# Patient Record
Sex: Male | Born: 1945 | Race: Black or African American | Hispanic: No | Marital: Married | Smoking: Former smoker
Health system: Southern US, Community
[De-identification: ages and names within clinical notes are randomized; demographics above are authoritative.]

## PROBLEM LIST (undated history)

## (undated) DIAGNOSIS — I2510 Atherosclerotic heart disease of native coronary artery without angina pectoris: Secondary | ICD-10-CM

## (undated) DIAGNOSIS — N183 Chronic kidney disease, stage 3 unspecified: Secondary | ICD-10-CM

## (undated) DIAGNOSIS — I10 Essential (primary) hypertension: Secondary | ICD-10-CM

## (undated) DIAGNOSIS — C61 Malignant neoplasm of prostate: Secondary | ICD-10-CM

## (undated) DIAGNOSIS — I219 Acute myocardial infarction, unspecified: Secondary | ICD-10-CM

## (undated) DIAGNOSIS — G4730 Sleep apnea, unspecified: Secondary | ICD-10-CM

## (undated) DIAGNOSIS — K5790 Diverticulosis of intestine, part unspecified, without perforation or abscess without bleeding: Secondary | ICD-10-CM

## (undated) DIAGNOSIS — G4733 Obstructive sleep apnea (adult) (pediatric): Secondary | ICD-10-CM

## (undated) DIAGNOSIS — E785 Hyperlipidemia, unspecified: Secondary | ICD-10-CM

## (undated) DIAGNOSIS — D689 Coagulation defect, unspecified: Secondary | ICD-10-CM

## (undated) HISTORY — DX: Coagulation defect, unspecified: D68.9

## (undated) HISTORY — DX: Essential (primary) hypertension: I10

## (undated) HISTORY — PX: ABDOMINAL SURGERY: SHX537

## (undated) HISTORY — PX: EXPLORATORY LAPAROTOMY: SUR591

## (undated) HISTORY — DX: Atherosclerotic heart disease of native coronary artery without angina pectoris: I25.10

## (undated) HISTORY — DX: Sleep apnea, unspecified: G47.30

## (undated) HISTORY — DX: Acute myocardial infarction, unspecified: I21.9

## (undated) HISTORY — PX: CORONARY ANGIOPLASTY WITH STENT PLACEMENT: SHX49

## (undated) HISTORY — DX: Diverticulosis of intestine, part unspecified, without perforation or abscess without bleeding: K57.90

## (undated) HISTORY — DX: Obstructive sleep apnea (adult) (pediatric): G47.33

## (undated) HISTORY — DX: Malignant neoplasm of prostate: C61

## (undated) HISTORY — DX: Chronic kidney disease, stage 3 unspecified: N18.30

## (undated) HISTORY — DX: Hyperlipidemia, unspecified: E78.5

---

## 1992-05-07 DIAGNOSIS — I2510 Atherosclerotic heart disease of native coronary artery without angina pectoris: Secondary | ICD-10-CM

## 1992-05-07 DIAGNOSIS — I219 Acute myocardial infarction, unspecified: Secondary | ICD-10-CM

## 1992-05-07 HISTORY — DX: Atherosclerotic heart disease of native coronary artery without angina pectoris: I25.10

## 1992-05-07 HISTORY — PX: CORONARY ARTERY BYPASS GRAFT: SHX141

## 1992-05-07 HISTORY — DX: Acute myocardial infarction, unspecified: I21.9

## 2000-03-23 ENCOUNTER — Emergency Department (HOSPITAL_COMMUNITY): Admission: EM | Admit: 2000-03-23 | Discharge: 2000-03-24 | Payer: Self-pay | Admitting: Emergency Medicine

## 2000-03-23 ENCOUNTER — Encounter: Payer: Self-pay | Admitting: Emergency Medicine

## 2002-07-06 ENCOUNTER — Encounter: Payer: Self-pay | Admitting: Internal Medicine

## 2003-07-08 ENCOUNTER — Encounter: Payer: Self-pay | Admitting: Internal Medicine

## 2003-09-01 ENCOUNTER — Emergency Department (HOSPITAL_COMMUNITY): Admission: EM | Admit: 2003-09-01 | Discharge: 2003-09-01 | Payer: Self-pay | Admitting: Emergency Medicine

## 2004-11-30 ENCOUNTER — Ambulatory Visit: Payer: Self-pay | Admitting: Internal Medicine

## 2006-01-23 ENCOUNTER — Ambulatory Visit: Payer: Self-pay | Admitting: Internal Medicine

## 2006-03-04 ENCOUNTER — Ambulatory Visit: Payer: Self-pay | Admitting: Internal Medicine

## 2006-03-04 LAB — CONVERTED CEMR LAB
AST: 27 units/L (ref 0–37)
Albumin: 3.9 g/dL (ref 3.5–5.2)
Alkaline Phosphatase: 69 units/L (ref 39–117)
BUN: 12 mg/dL (ref 6–23)
Basophils Absolute: 0 10*3/uL (ref 0.0–0.1)
Calcium: 9.2 mg/dL (ref 8.4–10.5)
Chol/HDL Ratio, serum: 6.3
Cholesterol: 256 mg/dL (ref 0–200)
Creatinine, Ser: 1.5 mg/dL (ref 0.4–1.5)
Eosinophil percent: 1.7 % (ref 0.0–5.0)
GFR calc non Af Amer: 51 mL/min
Glomerular Filtration Rate, Af Am: 61 mL/min/{1.73_m2}
Glucose, Bld: 97 mg/dL (ref 70–99)
LDL DIRECT: 143.1 mg/dL
Lymphocytes Relative: 35.4 % (ref 12.0–46.0)
MCHC: 32.8 g/dL (ref 30.0–36.0)
Monocytes Relative: 12.7 % — ABNORMAL HIGH (ref 3.0–11.0)
Neutro Abs: 2.8 10*3/uL (ref 1.4–7.7)
PSA: 3.24 ng/mL (ref 0.10–4.00)
RBC: 5.09 M/uL (ref 4.22–5.81)
VLDL: 43 mg/dL — ABNORMAL HIGH (ref 0–40)
WBC: 5.6 10*3/uL (ref 4.5–10.5)

## 2006-04-15 ENCOUNTER — Ambulatory Visit: Payer: Self-pay | Admitting: Internal Medicine

## 2006-07-02 ENCOUNTER — Ambulatory Visit: Payer: Self-pay | Admitting: Internal Medicine

## 2006-07-02 LAB — CONVERTED CEMR LAB
AST: 22 units/L (ref 0–37)
Albumin: 3.9 g/dL (ref 3.5–5.2)
BUN: 7 mg/dL (ref 6–23)
Basophils Relative: 0.6 % (ref 0.0–1.0)
Cholesterol: 197 mg/dL (ref 0–200)
HDL: 40.9 mg/dL (ref >39.0)
Hemoglobin: 15.1 g/dL (ref 13.0–17.0)
Monocytes Absolute: 0.7 10*3/uL (ref 0.2–0.7)
Monocytes Relative: 11 % (ref 3.0–11.0)
Neutro Abs: 3.3 10*3/uL (ref 1.4–7.7)
PSA: 3.72 ng/mL (ref 0.10–4.00)
Platelets: 261 10*3/uL (ref 150–400)
Sodium: 142 meq/L (ref 135–145)
Total Protein: 7.5 g/dL (ref 6.0–8.3)

## 2006-07-08 ENCOUNTER — Ambulatory Visit: Payer: Self-pay | Admitting: Internal Medicine

## 2006-12-18 DIAGNOSIS — E785 Hyperlipidemia, unspecified: Secondary | ICD-10-CM | POA: Insufficient documentation

## 2006-12-18 DIAGNOSIS — I10 Essential (primary) hypertension: Secondary | ICD-10-CM | POA: Insufficient documentation

## 2007-01-01 ENCOUNTER — Telehealth: Payer: Self-pay | Admitting: Internal Medicine

## 2007-01-07 ENCOUNTER — Ambulatory Visit: Payer: Self-pay | Admitting: Internal Medicine

## 2007-01-08 LAB — CONVERTED CEMR LAB
ALT: 22 units/L (ref 0–53)
Alkaline Phosphatase: 79 units/L (ref 39–117)
Bilirubin, Direct: 0.1 mg/dL (ref 0.0–0.3)
Cholesterol: 253 mg/dL (ref 0–200)
Direct LDL: 139.1 mg/dL
HDL: 39.3 mg/dL (ref >39.0)
Total Bilirubin: 1.1 mg/dL (ref 0.3–1.2)
Total Protein: 6.8 g/dL (ref 6.0–8.3)

## 2007-01-14 ENCOUNTER — Telehealth: Payer: Self-pay | Admitting: *Deleted

## 2007-01-23 ENCOUNTER — Ambulatory Visit: Payer: Self-pay | Admitting: Internal Medicine

## 2007-03-20 ENCOUNTER — Ambulatory Visit: Payer: Self-pay | Admitting: Internal Medicine

## 2007-03-20 LAB — CONVERTED CEMR LAB
ALT: 21 units/L (ref 0–53)
AST: 25 units/L (ref 0–37)
Albumin: 4.2 g/dL (ref 3.5–5.2)
Alkaline Phosphatase: 99 units/L (ref 39–117)
Bilirubin, Direct: 0.2 mg/dL (ref 0.0–0.3)
Total Bilirubin: 1 mg/dL (ref 0.3–1.2)
Total CHOL/HDL Ratio: 6
Triglycerides: 335 mg/dL (ref 0–149)

## 2007-03-31 ENCOUNTER — Ambulatory Visit: Payer: Self-pay | Admitting: Internal Medicine

## 2007-03-31 LAB — CONVERTED CEMR LAB
HDL goal, serum: 40 mg/dL
LDL Goal: 130 mg/dL

## 2007-09-05 ENCOUNTER — Ambulatory Visit: Payer: Self-pay | Admitting: Internal Medicine

## 2007-09-05 DIAGNOSIS — M549 Dorsalgia, unspecified: Secondary | ICD-10-CM | POA: Insufficient documentation

## 2007-09-05 DIAGNOSIS — J069 Acute upper respiratory infection, unspecified: Secondary | ICD-10-CM | POA: Insufficient documentation

## 2008-01-06 ENCOUNTER — Encounter: Payer: Self-pay | Admitting: Internal Medicine

## 2008-01-06 ENCOUNTER — Other Ambulatory Visit: Admission: RE | Admit: 2008-01-06 | Discharge: 2008-01-06 | Payer: Self-pay | Admitting: Otolaryngology

## 2008-01-09 ENCOUNTER — Ambulatory Visit: Payer: Self-pay | Admitting: Cardiovascular Disease

## 2008-01-09 ENCOUNTER — Ambulatory Visit: Payer: Self-pay | Admitting: Internal Medicine

## 2008-01-09 LAB — CONVERTED CEMR LAB
ALT: 17 units/L (ref 0–53)
Albumin: 3.9 g/dL (ref 3.5–5.2)
BUN: 8 mg/dL (ref 6–23)
Bilirubin, Direct: 0.1 mg/dL (ref 0.0–0.3)
Calcium: 9.3 mg/dL (ref 8.4–10.5)
Chloride: 108 meq/L (ref 96–112)
Cholesterol: 191 mg/dL (ref 0–200)
Creatinine, Ser: 1.3 mg/dL (ref 0.4–1.5)
Direct LDL: 82.2 mg/dL
GFR calc non Af Amer: 59 mL/min
Glucose, Bld: 103 mg/dL — ABNORMAL HIGH (ref 70–99)
HCT: 42.2 % (ref 39.0–52.0)
Lymphocytes Relative: 36 % (ref 12.0–46.0)
Monocytes Absolute: 0.7 10*3/uL (ref 0.1–1.0)
Monocytes Relative: 12.1 % — ABNORMAL HIGH (ref 3.0–12.0)
Neutrophils Relative %: 49.5 % (ref 43.0–77.0)
PSA: 2.9 ng/mL (ref 0.10–4.00)
Potassium: 5.6 meq/L — ABNORMAL HIGH (ref 3.5–5.1)
RBC: 4.74 M/uL (ref 4.22–5.81)
Total Bilirubin: 1.1 mg/dL (ref 0.3–1.2)
Total CHOL/HDL Ratio: 5.1
Triglycerides: 208 mg/dL (ref 0–149)

## 2008-01-14 ENCOUNTER — Encounter: Payer: Self-pay | Admitting: Internal Medicine

## 2008-02-24 ENCOUNTER — Ambulatory Visit: Payer: Self-pay | Admitting: Internal Medicine

## 2008-02-24 DIAGNOSIS — D37039 Neoplasm of uncertain behavior of the major salivary glands, unspecified: Secondary | ICD-10-CM | POA: Insufficient documentation

## 2008-03-02 ENCOUNTER — Encounter: Payer: Self-pay | Admitting: Internal Medicine

## 2008-03-12 ENCOUNTER — Ambulatory Visit: Payer: Self-pay | Admitting: Internal Medicine

## 2008-06-22 ENCOUNTER — Inpatient Hospital Stay (HOSPITAL_COMMUNITY): Admission: EM | Admit: 2008-06-22 | Discharge: 2008-06-27 | Payer: Self-pay | Admitting: Emergency Medicine

## 2008-06-23 HISTORY — PX: CARDIAC CATHETERIZATION: SHX172

## 2008-06-25 HISTORY — PX: PERCUTANEOUS CORONARY STENT INTERVENTION (PCI-S): SHX6016

## 2008-07-05 ENCOUNTER — Encounter: Payer: Self-pay | Admitting: Internal Medicine

## 2008-07-08 ENCOUNTER — Encounter (HOSPITAL_COMMUNITY): Admission: RE | Admit: 2008-07-08 | Discharge: 2008-10-06 | Payer: Self-pay | Admitting: Cardiovascular Disease

## 2008-08-18 ENCOUNTER — Ambulatory Visit: Payer: Self-pay | Admitting: Internal Medicine

## 2008-08-18 LAB — CONVERTED CEMR LAB
ALT: 17 units/L (ref 0–53)
Alkaline Phosphatase: 65 units/L (ref 39–117)
Bilirubin, Direct: 0.1 mg/dL (ref 0.0–0.3)
Total Protein: 7.2 g/dL (ref 6.0–8.3)

## 2008-09-01 ENCOUNTER — Ambulatory Visit: Payer: Self-pay | Admitting: Internal Medicine

## 2008-10-07 ENCOUNTER — Encounter (HOSPITAL_COMMUNITY): Admission: RE | Admit: 2008-10-07 | Discharge: 2008-12-05 | Payer: Self-pay | Admitting: Cardiovascular Disease

## 2008-10-27 ENCOUNTER — Encounter: Payer: Self-pay | Admitting: Internal Medicine

## 2008-12-07 ENCOUNTER — Emergency Department (HOSPITAL_COMMUNITY): Admission: EM | Admit: 2008-12-07 | Discharge: 2008-12-07 | Payer: Self-pay | Admitting: Emergency Medicine

## 2008-12-08 ENCOUNTER — Telehealth: Payer: Self-pay | Admitting: Family Medicine

## 2008-12-08 ENCOUNTER — Telehealth: Payer: Self-pay | Admitting: Internal Medicine

## 2008-12-08 DIAGNOSIS — N410 Acute prostatitis: Secondary | ICD-10-CM | POA: Insufficient documentation

## 2008-12-10 ENCOUNTER — Ambulatory Visit: Payer: Self-pay | Admitting: Endocrinology

## 2008-12-10 ENCOUNTER — Inpatient Hospital Stay (HOSPITAL_COMMUNITY): Admission: EM | Admit: 2008-12-10 | Discharge: 2008-12-12 | Payer: Self-pay | Admitting: Emergency Medicine

## 2008-12-13 ENCOUNTER — Encounter: Payer: Self-pay | Admitting: Internal Medicine

## 2008-12-21 ENCOUNTER — Telehealth: Payer: Self-pay | Admitting: Internal Medicine

## 2009-01-04 ENCOUNTER — Encounter: Payer: Self-pay | Admitting: Internal Medicine

## 2009-01-11 ENCOUNTER — Encounter: Payer: Self-pay | Admitting: Internal Medicine

## 2009-02-09 ENCOUNTER — Encounter: Payer: Self-pay | Admitting: Internal Medicine

## 2009-02-25 ENCOUNTER — Encounter: Payer: Self-pay | Admitting: Internal Medicine

## 2009-03-22 ENCOUNTER — Encounter (INDEPENDENT_AMBULATORY_CARE_PROVIDER_SITE_OTHER): Payer: Self-pay | Admitting: *Deleted

## 2009-03-28 ENCOUNTER — Telehealth: Payer: Self-pay | Admitting: Internal Medicine

## 2009-04-05 ENCOUNTER — Ambulatory Visit: Payer: Self-pay | Admitting: Internal Medicine

## 2009-04-05 DIAGNOSIS — C61 Malignant neoplasm of prostate: Secondary | ICD-10-CM | POA: Insufficient documentation

## 2009-04-07 ENCOUNTER — Encounter (INDEPENDENT_AMBULATORY_CARE_PROVIDER_SITE_OTHER): Payer: Self-pay | Admitting: *Deleted

## 2009-04-11 ENCOUNTER — Ambulatory Visit (HOSPITAL_COMMUNITY): Admission: RE | Admit: 2009-04-11 | Discharge: 2009-04-11 | Payer: Self-pay | Admitting: Urology

## 2009-04-15 ENCOUNTER — Encounter: Admission: RE | Admit: 2009-04-15 | Discharge: 2009-04-15 | Payer: Self-pay | Admitting: Otolaryngology

## 2009-04-28 ENCOUNTER — Ambulatory Visit (HOSPITAL_COMMUNITY): Admission: RE | Admit: 2009-04-28 | Discharge: 2009-04-28 | Payer: Self-pay | Admitting: Urology

## 2009-05-07 HISTORY — PX: OTHER SURGICAL HISTORY: SHX169

## 2009-05-11 ENCOUNTER — Encounter: Payer: Self-pay | Admitting: Internal Medicine

## 2009-05-16 ENCOUNTER — Encounter: Payer: Self-pay | Admitting: Internal Medicine

## 2009-05-18 ENCOUNTER — Encounter: Payer: Self-pay | Admitting: Internal Medicine

## 2009-05-25 ENCOUNTER — Ambulatory Visit (HOSPITAL_COMMUNITY): Admission: RE | Admit: 2009-05-25 | Discharge: 2009-05-26 | Payer: Self-pay | Admitting: Otolaryngology

## 2009-05-25 ENCOUNTER — Encounter (INDEPENDENT_AMBULATORY_CARE_PROVIDER_SITE_OTHER): Payer: Self-pay | Admitting: Otolaryngology

## 2009-06-07 HISTORY — PX: PROSTATECTOMY: SHX69

## 2009-06-15 ENCOUNTER — Telehealth: Payer: Self-pay | Admitting: Internal Medicine

## 2009-07-27 ENCOUNTER — Ambulatory Visit: Payer: Self-pay | Admitting: Internal Medicine

## 2009-07-29 ENCOUNTER — Telehealth: Payer: Self-pay | Admitting: Internal Medicine

## 2009-07-29 LAB — CONVERTED CEMR LAB: PSA: 3.4 ng/mL (ref 0.10–4.00)

## 2009-08-12 ENCOUNTER — Encounter (INDEPENDENT_AMBULATORY_CARE_PROVIDER_SITE_OTHER): Payer: Self-pay | Admitting: Urology

## 2009-08-12 ENCOUNTER — Inpatient Hospital Stay (HOSPITAL_COMMUNITY): Admission: RE | Admit: 2009-08-12 | Discharge: 2009-08-16 | Payer: Self-pay | Admitting: Urology

## 2009-10-10 ENCOUNTER — Encounter: Payer: Self-pay | Admitting: Internal Medicine

## 2009-10-23 ENCOUNTER — Observation Stay (HOSPITAL_COMMUNITY): Admission: EM | Admit: 2009-10-23 | Discharge: 2009-10-26 | Payer: Self-pay | Admitting: Emergency Medicine

## 2009-10-23 ENCOUNTER — Emergency Department (HOSPITAL_COMMUNITY): Admission: EM | Admit: 2009-10-23 | Discharge: 2009-10-23 | Payer: Self-pay | Admitting: Family Medicine

## 2009-10-24 ENCOUNTER — Ambulatory Visit: Payer: Self-pay | Admitting: Vascular Surgery

## 2009-10-24 ENCOUNTER — Encounter (INDEPENDENT_AMBULATORY_CARE_PROVIDER_SITE_OTHER): Payer: Self-pay | Admitting: Internal Medicine

## 2010-01-10 ENCOUNTER — Encounter: Payer: Self-pay | Admitting: Internal Medicine

## 2010-04-18 ENCOUNTER — Encounter: Payer: Self-pay | Admitting: Internal Medicine

## 2010-06-06 NOTE — Progress Notes (Signed)
  Phone Note Call from Patient Call back at Home Phone [phone]   Caller: Spouse----live call Reason for Call: Lab or Test Results Summary of Call: would like lab results. Initial call taken by: Warnell Forester,  July 29, 2009 1:43 PM  Follow-up for Phone Call        may tell him result....    Follow-up by: Stacie Glaze MD,  July 29, 2009 2:19 PM  Additional Follow-up for Phone Call Additional follow up Details #1::        pt's wife informed and faxed to dr Earlene Plater his surgeon Additional Follow-up by: Willy Eddy, LPN,  July 29, 2009 2:25 PM

## 2010-06-06 NOTE — Letter (Signed)
Summary: [facility]  [facility]   Imported By: Maryln Gottron 05/25/2009 13:56:19  _____________________________________________________________________  External Attachment:    Type:   Image     Comment:   External Document

## 2010-06-06 NOTE — Assessment & Plan Note (Signed)
Summary: 2 MONTH ROV/NJR/PT RSC/CJR   Vital Signs:  Patient profile:   64 year old male Height:      71 inches Weight:      216 pounds BMI:     30.23 Temp:     98.2 degrees F oral Pulse rate:   72 / minute Resp:     14 per minute BP sitting:   136 / 80  (left arm)  Vitals Entered By: Willy Eddy, LPN (July 27, 2009 10:37 AM) CC: roa   CC:  roa.  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 1 Once Daily 2)  Altace 10 Mg  Tabs (Ramipril) .... Take 1 Tablet By Mouth Once A Day 3)  Crestor 40 Mg Tabs (Rosuvastatin Calcium) .Marland Kitchen.. 1 Once Daily 4)  Aspirin 81 Mg  Tbec (Aspirin) .... Once Daily 5)  Cvs Vitamin E 800 Unit  Caps (Vitamin E) .... Once Daily 6)  Coq10 100 Mg  Caps (Coenzyme Q10) .... Once Daily 7)  Flomax 0.4 Mg  Cp24 (Tamsulosin Hcl) .... Take 1 Capsule By Mouth Once A Day 8)  Proscar 5 Mg  Tabs (Finasteride) .... Once Daily 9)  Plavix 75 Mg Tabs (Clopidogrel  Bisulfate) .Marland Kitchen.. 1 Once Daily 10)  Ranexa 500 Mg Xr12h-Tab (Ranolazine) .Marland Kitchen.. 1 Once Daily 11)  Niaspan 500 Mg Cr-Tabs (Niacin (Antihyperlipidemic)) .Marland Kitchen.. 1 Once Daily 12)  Diovan 80 Mg Tabs (Valsartan) .Marland Kitchen.. 1 Once Daily 13)  Lovaza 1 Gm Caps (Omega-3-Acid Ethyl Esters) .... 2 Two Times A Day  Current Medications (verified): 1)  Toprol Xl 50 Mg Tb24 (Metoprolol Succinate) .Marland Kitchen.. 1 Once Daily 2)  Altace 10 Mg  Tabs (Ramipril) .... Take 1 Tablet By Mouth Once A Day 3)  Crestor 40 Mg Tabs (Rosuvastatin Calcium) .Marland Kitchen.. 1 Once Daily 4)  Aspirin 81 Mg  Tbec (Aspirin) .... Once Daily 5)  Cvs Vitamin E 800 Unit  Caps (Vitamin E) .... Once Daily 6)  Coq10 100 Mg  Caps (Coenzyme Q10) .... Once Daily 7)  Flomax 0.4 Mg  Cp24 (Tamsulosin Hcl) .... Take 1 Capsule By Mouth Once A Day 8)  Proscar 5 Mg  Tabs (Finasteride) .... Once Daily 9)  Plavix 75 Mg Tabs (Clopidogrel Bisulfate) .Marland Kitchen.. 1 Once Daily 10)  Ranexa 500 Mg Xr12h-Tab (Ranolazine) .Marland Kitchen.. 1 Once Daily 11)  Niaspan 500 Mg Cr-Tabs (Niacin (Antihyperlipidemic)) .Marland Kitchen.. 1 Once Daily 12)  Diovan 80 Mg Tabs (Valsartan) .Marland Kitchen.. 1 Once Daily 13)  Lovaza 1 Gm Caps (Omega-3-Acid Ethyl  Esters) .... 2 Two Times A Day  Allergies (verified): No Known Drug Allergies  Past History:  Social History: Last updated: 03/31/2007 Occupation:pastor Married Never Smoked  Risk Factors: Smoking Status: never (07/27/2009)  Past medical, surgical, family and social histories (including risk factors) reviewed, and no changes noted (except as noted below).  Past Medical History: Reviewed history from 12/18/2006 and no changes required. Hyperlipidemia Hypertension  Family History: Reviewed history and no changes required.  Social History: Reviewed history from 03/31/2007 and no changes required. 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    Physical Exam  General:  alert and well-developed.   Ears:  R ear normal and L ear normal.   Mouth:  good dentition and pharynx pink and moist.   Neck:  No deformities, masses, or tenderness noted. Lungs:  normal respiratory effort and no wheezes.   Heart:  normal rate and regular rhythm.   Abdomen:  soft and non-tender.   Msk:  normal ROM, no joint tenderness, and no joint swelling.   Extremities:  No clubbing, cyanosis, edema, or deformity noted with normal full range of motion of all joints.   Neurologic:  No cranial nerve deficits noted. Station and gait are normal. Plantar reflexes are down-going bilaterally. DTRs are symmetrical throughout. Sensory, motor and coordinative functions appear intact.   Impression & Recommendations:  Problem # 1:  ADENOCARCINOMA, PROSTATE, GLEASON GRADE 7 (ICD-185) Assessment Unchanged discussed options at length surgical removal and follow up plans  Orders: TLB-PSA (Prostate Specific Antigen) (84153-PSA)  Problem # 2:  ADENOCARCINOMA, PROSTATE, GLEASON GRADE 7 (ICD-185) Assessment: Unchanged  Orders: TLB-PSA (Prostate Specific Antigen) (84153-PSA)  Complete Medication List: 1)  Toprol Xl 50 Mg Tb24 (Metoprolol succinate) .Marland Kitchen.. 1 once daily 2)  Altace 10 Mg Tabs (Ramipril) .... Take 1 tablet by mouth once a day 3)  Crestor 40 Mg Tabs (Rosuvastatin calcium) .Marland Kitchen.. 1 once daily 4)  Aspirin 81 Mg Tbec (Aspirin) .... Once daily 5)  Cvs Vitamin E 800 Unit Caps (Vitamin e) .... Once daily 6)  Coq10 100 Mg Caps (Coenzyme q10) .... Once daily 7)  Flomax 0.4 Mg Cp24  (Tamsulosin hcl) .... Take 1 capsule by mouth once a day 8)  Proscar 5 Mg Tabs (Finasteride) .... Once daily 9)  Plavix 75 Mg Tabs (Clopidogrel bisulfate) .Marland Kitchen.. 1 once daily 10)  Ranexa 500 Mg Xr12h-tab (Ranolazine) .Marland Kitchen.. 1 once daily 11)  Niaspan 500 Mg Cr-tabs (Niacin (antihyperlipidemic)) .Marland Kitchen.. 1 once daily 12)  Diovan 80 Mg Tabs (Valsartan) .Marland Kitchen.. 1 once daily 13)  Lovaza 1 Gm Caps (Omega-3-acid ethyl esters) .... 2 two times a day  Patient Instructions: 1)  Please schedule a follow-up appointment in 4 m

## 2010-06-06 NOTE — Progress Notes (Signed)
Summary: wants repeat PSA  Phone Note Call from Patient   Caller: executive asst,belinda, [phone] Reason for Call: Acute Illness Summary of Call: Wants to have a repeat PSA before his prostate surgery on the 23rd. Initial call taken by: Rudy Jew, RN,  June 15, 2009 4:01 PM  Follow-up for Phone Call        suggested to pt that he ask for psa when they draw his pre op lab work- pt agrees Follow-up by: Willy Eddy, LPN,  June 15, 2009 4:20 PM

## 2010-06-06 NOTE — Letter (Signed)
Summary: [facility]  [facility]   Imported By: Maryln Gottron 01/23/2010 13:12:13  _____________________________________________________________________  External Attachment:    Type:   Image     Comment:   External Document

## 2010-06-06 NOTE — Letter (Signed)
Summary: [facility]  [facility]   Imported By: Maryln Gottron 10/17/2009 09:42:33  _____________________________________________________________________  External Attachment:    Type:   Image     Comment:   External Document

## 2010-06-08 NOTE — Letter (Signed)
Summary: [facility]  [facility]   Imported By: Lennie Odor 04/25/2010 14:53:42  _____________________________________________________________________  External Attachment:    Type:   Image     Comment:   External Document

## 2010-06-09 NOTE — Letter (Signed)
Summary: [facility]  [facility]   Imported By: Maryln Gottron 05/18/2009 14:00:05  _____________________________________________________________________  External Attachment:    Type:   Image     Comment:   External Document

## 2010-07-22 LAB — COMPREHENSIVE METABOLIC PANEL
ALT: 20 U/L (ref 0–53)
AST: 29 U/L (ref 0–37)
Albumin: 3.8 g/dL (ref 3.5–5.2)
Chloride: 103 mEq/L (ref 96–112)
Creatinine, Ser: 1.59 mg/dL — ABNORMAL HIGH (ref 0.4–1.5)
GFR calc Af Amer: 53 mL/min — ABNORMAL LOW (ref >60)
Potassium: 4.8 mEq/L (ref 3.5–5.1)
Sodium: 135 mEq/L (ref 135–145)

## 2010-07-22 LAB — CBC
HCT: 42.5 % (ref 39.0–52.0)
Hemoglobin: 14.3 g/dL (ref 13.0–17.0)
MCV: 93 fL (ref 78.0–100.0)
RDW: 13.2 % (ref 11.5–15.5)

## 2010-07-23 LAB — COMPREHENSIVE METABOLIC PANEL
ALT: 17 U/L (ref 0–53)
ALT: 17 U/L (ref 0–53)
AST: 23 U/L (ref 0–37)
AST: 23 U/L (ref 0–37)
Albumin: 3.5 g/dL (ref 3.5–5.2)
Albumin: 3.6 g/dL (ref 3.5–5.2)
Alkaline Phosphatase: 69 U/L (ref 39–117)
Calcium: 9.1 mg/dL (ref 8.4–10.5)
Chloride: 104 mEq/L (ref 96–112)
Chloride: 105 mEq/L (ref 96–112)
Creatinine, Ser: 1.46 mg/dL (ref 0.4–1.5)
GFR calc Af Amer: 59 mL/min — ABNORMAL LOW (ref >60)
Potassium: 4.1 mEq/L (ref 3.5–5.1)
Sodium: 135 mEq/L (ref 135–145)
Sodium: 137 mEq/L (ref 135–145)
Total Protein: 6.8 g/dL (ref 6.0–8.3)

## 2010-07-23 LAB — CK TOTAL AND CKMB (NOT AT ARMC)
Relative Index: 0.8 (ref 0.0–2.5)
Total CK: 204 U/L (ref 7–232)

## 2010-07-23 LAB — POCT CARDIAC MARKERS: Troponin i, poc: <0.05 ng/mL (ref 0.00–0.09)

## 2010-07-23 LAB — ANTI-SMITH ANTIBODY: ENA SM Ab Ser-aCnc: <1 AU/mL (ref <30)

## 2010-07-23 LAB — BASIC METABOLIC PANEL
BUN: 6 mg/dL (ref 6–23)
CO2: 26 mEq/L (ref 19–32)
CO2: 29 mEq/L (ref 19–32)
Calcium: 9.3 mg/dL (ref 8.4–10.5)
Chloride: 103 mEq/L (ref 96–112)
Creatinine, Ser: 1.47 mg/dL (ref 0.4–1.5)
Glucose, Bld: 120 mg/dL — ABNORMAL HIGH (ref 70–99)
Potassium: 4.2 mEq/L (ref 3.5–5.1)
Potassium: 4.4 mEq/L (ref 3.5–5.1)
Sodium: 135 mEq/L (ref 135–145)

## 2010-07-23 LAB — DIFFERENTIAL
Basophils Absolute: 0.1 10*3/uL (ref 0.0–0.1)
Basophils Relative: 1 % (ref 0–1)
Eosinophils Absolute: 0.2 10*3/uL (ref 0.0–0.7)
Eosinophils Relative: 3 % (ref 0–5)
Monocytes Relative: 12 % (ref 3–12)
Neutrophils Relative %: 52 % (ref 43–77)

## 2010-07-23 LAB — CBC
HCT: 40.3 % (ref 39.0–52.0)
HCT: 43.5 % (ref 39.0–52.0)
HCT: 45.2 % (ref 39.0–52.0)
Hemoglobin: 15 g/dL (ref 13.0–17.0)
MCHC: 33.3 g/dL (ref 30.0–36.0)
MCHC: 33.6 g/dL (ref 30.0–36.0)
MCV: 90 fL (ref 78.0–100.0)
MCV: 91.1 fL (ref 78.0–100.0)
Platelets: 165 10*3/uL (ref 150–400)
Platelets: 167 10*3/uL (ref 150–400)
RBC: 4.83 MIL/uL (ref 4.22–5.81)
RDW: 12.9 % (ref 11.5–15.5)
WBC: 6.7 10*3/uL (ref 4.0–10.5)
WBC: 6.8 10*3/uL (ref 4.0–10.5)

## 2010-07-23 LAB — LIPID PANEL
Cholesterol: 191 mg/dL (ref 0–200)
HDL: 42 mg/dL (ref >39)
Total CHOL/HDL Ratio: 4.5 RATIO
VLDL: UNDETERMINED mg/dL (ref 0–40)

## 2010-07-23 LAB — ANA: Anti Nuclear Antibody(ANA): NEGATIVE

## 2010-07-23 LAB — URINALYSIS, ROUTINE W REFLEX MICROSCOPIC
Bilirubin Urine: NEGATIVE
Glucose, UA: NEGATIVE mg/dL
Hgb urine dipstick: NEGATIVE
Ketones, ur: NEGATIVE mg/dL
Protein, ur: NEGATIVE mg/dL

## 2010-07-23 LAB — ACETYLCHOLINE RECEPTOR, BINDING: Acetylcholine Receptor Ab: <0.3 nmol/L (ref <=0.30)

## 2010-07-23 LAB — TROPONIN I: Troponin I: 0.02 ng/mL (ref 0.00–0.06)

## 2010-07-23 LAB — PROTIME-INR: INR: 0.95 (ref 0.00–1.49)

## 2010-07-23 LAB — APTT: aPTT: 28 seconds (ref 24–37)

## 2010-07-26 LAB — CARDIAC PANEL(CRET KIN+CKTOT+MB+TROPI)
Relative Index: 0.4 (ref 0.0–2.5)
Relative Index: 0.4 (ref 0.0–2.5)
Total CK: 369 U/L — ABNORMAL HIGH (ref 7–232)
Total CK: 624 U/L — ABNORMAL HIGH (ref 7–232)
Troponin I: 0.02 ng/mL (ref 0.00–0.06)
Troponin I: 0.03 ng/mL (ref 0.00–0.06)

## 2010-07-26 LAB — BASIC METABOLIC PANEL
BUN: 12 mg/dL (ref 6–23)
Calcium: 8.4 mg/dL (ref 8.4–10.5)
Chloride: 106 mEq/L (ref 96–112)
GFR calc Af Amer: 40 mL/min — ABNORMAL LOW (ref >60)
GFR calc Af Amer: 42 mL/min — ABNORMAL LOW (ref >60)
GFR calc non Af Amer: 33 mL/min — ABNORMAL LOW (ref >60)
GFR calc non Af Amer: 34 mL/min — ABNORMAL LOW (ref >60)
Potassium: 4.7 mEq/L (ref 3.5–5.1)
Potassium: 4.7 mEq/L (ref 3.5–5.1)
Sodium: 135 mEq/L (ref 135–145)
Sodium: 139 mEq/L (ref 135–145)

## 2010-07-26 LAB — TYPE AND SCREEN

## 2010-07-26 LAB — COMPREHENSIVE METABOLIC PANEL
ALT: 21 U/L (ref 0–53)
AST: 25 U/L (ref 0–37)
Calcium: 9 mg/dL (ref 8.4–10.5)
GFR calc Af Amer: 58 mL/min — ABNORMAL LOW (ref >60)
Sodium: 138 mEq/L (ref 135–145)
Total Protein: 7.1 g/dL (ref 6.0–8.3)

## 2010-07-26 LAB — CBC
MCHC: 33.4 g/dL (ref 30.0–36.0)
RDW: 12.2 % (ref 11.5–15.5)

## 2010-07-26 LAB — HEMOGLOBIN AND HEMATOCRIT, BLOOD
HCT: 38.8 % — ABNORMAL LOW (ref 39.0–52.0)
Hemoglobin: 12.4 g/dL — ABNORMAL LOW (ref 13.0–17.0)
Hemoglobin: 13.1 g/dL (ref 13.0–17.0)

## 2010-07-26 LAB — APTT: aPTT: 29 seconds (ref 24–37)

## 2010-07-26 LAB — PROTIME-INR: Prothrombin Time: 12.4 seconds (ref 11.6–15.2)

## 2010-07-26 LAB — ABO/RH: ABO/RH(D): O POS

## 2010-08-12 LAB — URINE CULTURE
Colony Count: >=100000
Colony Count: NO GROWTH

## 2010-08-12 LAB — POCT I-STAT, CHEM 8
BUN: 14 mg/dL (ref 6–23)
Calcium, Ion: 1.12 mmol/L (ref 1.12–1.32)
Chloride: 101 meq/L (ref 96–112)
Creatinine, Ser: 1.8 mg/dL — ABNORMAL HIGH (ref 0.4–1.5)
Glucose, Bld: 119 mg/dL — ABNORMAL HIGH (ref 70–99)
HCT: 40 % (ref 39.0–52.0)
Hemoglobin: 13.6 g/dL (ref 13.0–17.0)
Potassium: 4 mEq/L (ref 3.5–5.1)
Sodium: 134 mEq/L — ABNORMAL LOW (ref 135–145)
TCO2: 22 mmol/L (ref 0–100)

## 2010-08-12 LAB — COMPREHENSIVE METABOLIC PANEL
AST: 32 U/L (ref 0–37)
Albumin: 2.8 g/dL — ABNORMAL LOW (ref 3.5–5.2)
Calcium: 8.7 mg/dL (ref 8.4–10.5)
Creatinine, Ser: 1.82 mg/dL — ABNORMAL HIGH (ref 0.4–1.5)
GFR calc Af Amer: 46 mL/min — ABNORMAL LOW (ref >60)
GFR calc non Af Amer: 38 mL/min — ABNORMAL LOW (ref >60)

## 2010-08-12 LAB — CBC
HCT: 38.3 % — ABNORMAL LOW (ref 39.0–52.0)
Hemoglobin: 12.6 g/dL — ABNORMAL LOW (ref 13.0–17.0)
Hemoglobin: 13 g/dL (ref 13.0–17.0)
MCHC: 33.6 g/dL (ref 30.0–36.0)
MCHC: 33.9 g/dL (ref 30.0–36.0)
MCV: 91.3 fL (ref 78.0–100.0)
MCV: 92 fL (ref 78.0–100.0)
Platelets: 116 10*3/uL — ABNORMAL LOW (ref 150–400)
Platelets: 128 10*3/uL — ABNORMAL LOW (ref 150–400)
RBC: 4.08 MIL/uL — ABNORMAL LOW (ref 4.22–5.81)
RBC: 4.19 MIL/uL — ABNORMAL LOW (ref 4.22–5.81)
RDW: 12.4 % (ref 11.5–15.5)
RDW: 12.5 % (ref 11.5–15.5)
RDW: 13 % (ref 11.5–15.5)
WBC: 16.2 10*3/uL — ABNORMAL HIGH (ref 4.0–10.5)
WBC: 9.8 10*3/uL (ref 4.0–10.5)

## 2010-08-12 LAB — BASIC METABOLIC PANEL
BUN: 13 mg/dL (ref 6–23)
Calcium: 8.8 mg/dL (ref 8.4–10.5)
GFR calc Af Amer: 38 mL/min — ABNORMAL LOW (ref >60)
GFR calc Af Amer: 48 mL/min — ABNORMAL LOW (ref >60)
GFR calc non Af Amer: 31 mL/min — ABNORMAL LOW (ref >60)
GFR calc non Af Amer: 39 mL/min — ABNORMAL LOW (ref >60)
Glucose, Bld: 147 mg/dL — ABNORMAL HIGH (ref 70–99)
Potassium: 4.2 mEq/L (ref 3.5–5.1)
Potassium: 4.8 mEq/L (ref 3.5–5.1)
Sodium: 132 mEq/L — ABNORMAL LOW (ref 135–145)
Sodium: 134 mEq/L — ABNORMAL LOW (ref 135–145)

## 2010-08-12 LAB — HEMOGLOBIN A1C: Mean Plasma Glucose: 137 mg/dL

## 2010-08-12 LAB — CULTURE, BLOOD (ROUTINE X 2)
Culture: NO GROWTH
Culture: NO GROWTH
Culture: NO GROWTH

## 2010-08-12 LAB — DIFFERENTIAL
Basophils Absolute: 0 10*3/uL (ref 0.0–0.1)
Basophils Absolute: 0 10*3/uL (ref 0.0–0.1)
Basophils Relative: 0 % (ref 0–1)
Eosinophils Absolute: 0 10*3/uL (ref 0.0–0.7)
Eosinophils Relative: 0 % (ref 0–5)
Lymphocytes Relative: 6 % — ABNORMAL LOW (ref 12–46)
Lymphocytes Relative: 7 % — ABNORMAL LOW (ref 12–46)
Lymphs Abs: 0.6 10*3/uL — ABNORMAL LOW (ref 0.7–4.0)
Lymphs Abs: 1.1 K/uL (ref 0.7–4.0)
Monocytes Absolute: 0.9 10*3/uL (ref 0.1–1.0)
Monocytes Absolute: 1.7 K/uL — ABNORMAL HIGH (ref 0.1–1.0)
Monocytes Relative: 10 % (ref 3–12)
Neutro Abs: 13.4 K/uL — ABNORMAL HIGH (ref 1.7–7.7)
Neutro Abs: 8.3 10*3/uL — ABNORMAL HIGH (ref 1.7–7.7)
Neutrophils Relative %: 82 % — ABNORMAL HIGH (ref 43–77)

## 2010-08-12 LAB — URINALYSIS, ROUTINE W REFLEX MICROSCOPIC
Bilirubin Urine: NEGATIVE
Glucose, UA: NEGATIVE mg/dL
Hgb urine dipstick: NEGATIVE
Ketones, ur: NEGATIVE mg/dL
Nitrite: POSITIVE — AB
Protein, ur: NEGATIVE mg/dL
Specific Gravity, Urine: 1.015 (ref 1.005–1.030)
Specific Gravity, Urine: 1.022 (ref 1.005–1.030)
Urobilinogen, UA: 1 mg/dL (ref 0.0–1.0)
pH: 8 (ref 5.0–8.0)
pH: 8 (ref 5.0–8.0)

## 2010-08-12 LAB — PROTIME-INR
INR: 1.1 (ref 0.00–1.49)
Prothrombin Time: 13.7 seconds (ref 11.6–15.2)

## 2010-08-12 LAB — URINE MICROSCOPIC-ADD ON

## 2010-08-12 LAB — SODIUM, URINE, RANDOM: Sodium, Ur: 153 mEq/L

## 2010-08-12 LAB — LIPID PANEL
HDL: 40 mg/dL (ref >39)
Total CHOL/HDL Ratio: 3 RATIO
VLDL: 20 mg/dL (ref 0–40)

## 2010-08-12 LAB — OSMOLALITY, URINE: Osmolality, Ur: 555 mOsm/kg (ref 390–1090)

## 2010-08-22 LAB — LIPID PANEL: VLDL: UNDETERMINED mg/dL (ref 0–40)

## 2010-08-22 LAB — BASIC METABOLIC PANEL
BUN: 4 mg/dL — ABNORMAL LOW (ref 6–23)
BUN: 6 mg/dL (ref 6–23)
BUN: 7 mg/dL (ref 6–23)
CO2: 23 mEq/L (ref 19–32)
CO2: 26 mEq/L (ref 19–32)
Calcium: 8.3 mg/dL — ABNORMAL LOW (ref 8.4–10.5)
Calcium: 8.7 mg/dL (ref 8.4–10.5)
Chloride: 102 mEq/L (ref 96–112)
Chloride: 105 mEq/L (ref 96–112)
Creatinine, Ser: 1.85 mg/dL — ABNORMAL HIGH (ref 0.4–1.5)
GFR calc Af Amer: 45 mL/min — ABNORMAL LOW (ref >60)
GFR calc non Af Amer: 41 mL/min — ABNORMAL LOW (ref >60)
GFR calc non Af Amer: 45 mL/min — ABNORMAL LOW (ref >60)
GFR calc non Af Amer: 50 mL/min — ABNORMAL LOW (ref >60)
Glucose, Bld: 115 mg/dL — ABNORMAL HIGH (ref 70–99)
Glucose, Bld: 122 mg/dL — ABNORMAL HIGH (ref 70–99)
Glucose, Bld: 133 mg/dL — ABNORMAL HIGH (ref 70–99)
Potassium: 4.2 mEq/L (ref 3.5–5.1)
Potassium: 4.7 mEq/L (ref 3.5–5.1)
Sodium: 134 mEq/L — ABNORMAL LOW (ref 135–145)
Sodium: 136 mEq/L (ref 135–145)
Sodium: 136 mEq/L (ref 135–145)
Sodium: 139 mEq/L (ref 135–145)

## 2010-08-22 LAB — CBC
HCT: 34.2 % — ABNORMAL LOW (ref 39.0–52.0)
HCT: 36.1 % — ABNORMAL LOW (ref 39.0–52.0)
HCT: 37.2 % — ABNORMAL LOW (ref 39.0–52.0)
HCT: 37.2 % — ABNORMAL LOW (ref 39.0–52.0)
HCT: 39.8 % (ref 39.0–52.0)
HCT: 45.2 % (ref 39.0–52.0)
Hemoglobin: 11.7 g/dL — ABNORMAL LOW (ref 13.0–17.0)
Hemoglobin: 12.5 g/dL — ABNORMAL LOW (ref 13.0–17.0)
Hemoglobin: 12.6 g/dL — ABNORMAL LOW (ref 13.0–17.0)
Hemoglobin: 13.7 g/dL (ref 13.0–17.0)
MCHC: 33.7 g/dL (ref 30.0–36.0)
MCHC: 34.3 g/dL (ref 30.0–36.0)
MCHC: 34.5 g/dL (ref 30.0–36.0)
MCHC: 34.6 g/dL (ref 30.0–36.0)
MCV: 88.9 fL (ref 78.0–100.0)
MCV: 89 fL (ref 78.0–100.0)
MCV: 89.8 fL (ref 78.0–100.0)
MCV: 91.2 fL (ref 78.0–100.0)
Platelets: 154 10*3/uL (ref 150–400)
Platelets: 158 10*3/uL (ref 150–400)
Platelets: 210 10*3/uL (ref 150–400)
RBC: 3.73 MIL/uL — ABNORMAL LOW (ref 4.22–5.81)
RBC: 3.84 MIL/uL — ABNORMAL LOW (ref 4.22–5.81)
RBC: 4.15 MIL/uL — ABNORMAL LOW (ref 4.22–5.81)
RBC: 4.52 MIL/uL (ref 4.22–5.81)
RBC: 5.02 MIL/uL (ref 4.22–5.81)
RDW: 12.6 % (ref 11.5–15.5)
RDW: 12.7 % (ref 11.5–15.5)
RDW: 12.7 % (ref 11.5–15.5)
RDW: 12.8 % (ref 11.5–15.5)
WBC: 6 10*3/uL (ref 4.0–10.5)
WBC: 6.7 10*3/uL (ref 4.0–10.5)
WBC: 8.1 10*3/uL (ref 4.0–10.5)
WBC: 8.2 10*3/uL (ref 4.0–10.5)

## 2010-08-22 LAB — DIFFERENTIAL
Basophils Absolute: 0.1 10*3/uL (ref 0.0–0.1)
Basophils Relative: 1 % (ref 0–1)
Neutro Abs: 3.3 10*3/uL (ref 1.7–7.7)
Neutrophils Relative %: 55 % (ref 43–77)

## 2010-08-22 LAB — CK TOTAL AND CKMB (NOT AT ARMC)
CK, MB: 1.1 ng/mL (ref 0.3–4.0)
CK, MB: 1.3 ng/mL (ref 0.3–4.0)
CK, MB: 2.1 ng/mL (ref 0.3–4.0)
Relative Index: 1.4 (ref 0.0–2.5)
Total CK: 11 U/L (ref 7–232)
Total CK: 152 U/L (ref 7–232)

## 2010-08-22 LAB — COMPREHENSIVE METABOLIC PANEL
AST: 23 U/L (ref 0–37)
Albumin: 3.9 g/dL (ref 3.5–5.2)
Alkaline Phosphatase: 92 U/L (ref 39–117)
BUN: 7 mg/dL (ref 6–23)
Chloride: 102 mEq/L (ref 96–112)
Potassium: 3.9 mEq/L (ref 3.5–5.1)
Total Bilirubin: 0.8 mg/dL (ref 0.3–1.2)

## 2010-08-22 LAB — HEMOGLOBIN A1C: Mean Plasma Glucose: 126 mg/dL

## 2010-08-22 LAB — POCT CARDIAC MARKERS
CKMB, poc: 1.6 ng/mL (ref 1.0–8.0)
Myoglobin, poc: 130 ng/mL (ref 12–200)
Myoglobin, poc: 189 ng/mL (ref 12–200)

## 2010-08-22 LAB — PROTIME-INR: Prothrombin Time: 12 seconds (ref 11.6–15.2)

## 2010-08-22 LAB — MAGNESIUM: Magnesium: 2.3 mg/dL (ref 1.5–2.5)

## 2010-08-22 LAB — CARDIAC PANEL(CRET KIN+CKTOT+MB+TROPI)
Relative Index: 1.4 (ref 0.0–2.5)
Total CK: 144 U/L (ref 7–232)

## 2010-08-22 LAB — HEPARIN LEVEL (UNFRACTIONATED): Heparin Unfractionated: 0.16 IU/mL — ABNORMAL LOW (ref 0.30–0.70)

## 2010-08-22 LAB — TROPONIN I: Troponin I: 0.03 ng/mL (ref 0.00–0.06)

## 2010-09-19 NOTE — Cardiovascular Report (Signed)
NAME:  Joshua Hahn NO.:  [id number]   MEDICAL RECORD NO.:  [id number]          PATIENT TYPE:  INP   LOCATION:  3310                         FACILITY:  [facility]   PHYSICIAN:  Nicki Guadalajara, M.D.     DATE OF BIRTH:  12-27-1945   DATE OF PROCEDURE:  06/23/2008  DATE OF DISCHARGE:                            CARDIAC CATHETERIZATION   Joshua Hahn is a 65 year old African American male, who has  known coronary artery disease.  In 1988, he underwent PTCA of the distal  right coronary artery stenosis.  In 1994, while he was in [facility],  he suffered an inferior wall myocardial infarction.  He was admitted to  [facility], treated with thrombolytic therapy and ultimately transferred to  [facility] where cardiac catheterization demonstrated  progressive CAD.  Consequently, on Sep 14, 1992, he underwent CABG  revascularization surgery with a free left internal mammary artery graft  to a high large first diagonal vessel, a saphenous vein graft to his  LAD.  He had a sequential vein graft to the first and second marginal  vessel and he also had a saphenous vein graft to his right coronary  artery.  He has history of hypertension and hyperlipidemia.  He had been  doing fairly well on medical therapy.  He was last seen by me in 2007.  Yesterday morning, he was awakened with substernal chest pressure  suggestive of unstable angina.  He was admitted to [facility]  where he was seen and evaluated by Dr. Alanda Amass.  He was started on  anticoagulation IV nitroglycerin.  Cardiac enzymes have been negative.  He is now referred for definitive cardiac catheterization.   PROCEDURE:  After premedication with Valium 5 mg intravenously, the  patient was prepped and draped in usual fashion.  His right femoral  artery was punctured anteriorly and a 5-French sheath was inserted.  Diagnostic catheterization was done utilizing 5-French Judkins for left  and right coronary  catheters.  The right catheter was used for selective  angiography into the grafts coming off the aorta.  However, there only 3  aortic wires where the grafts arose.  Upon further inspection of the  operative report, it became apparent that the most cephalad vessel  arising from the aorta was actually the sequential OM1 and OM2 graft,  but actually all that was visualized was the free LIMA graft, which came  off the hood of the ostium of this vein graft, and the vein graft itself  beyond this was occluded and not visualized.  Consequently, it initially  appeared that only 3 vessels were being supplied, but actually the vein  graft was occluded where the RIMA graft arose from the hood of the  graft.  The catheter was also used for selective angiography into the  left internal mammary artery to verify that this in fact was a free LIMA  graft and the vessel was then seen to be ligated in its mid portion.  A  5-French pigtail catheter was used for biplane cine left  ventriculography.  Central aortic root imaging was done to  make certain  there was not another graft that had come off the aorta, which did not  have a marker.  Pigtail catheter was used for distal aortography.  Hemostasis was obtained by direct manual pressure.  The patient  tolerated the procedure well.   HEMODYNAMIC DATA:  Central aortic pressure is 100/60.  Left ventricular  pressure 100/16.   ANGIOGRAPHIC DATA:  Left main coronary artery was moderate-sized vessel  that just seen to fill the circumflex system.   The LAD was occluded at its origin.  The ostial circumflex had at least  40-50% taper.  This immediately gave rise to a high marginal vessel,  which had diffuse 99% proximal stenosis followed by 95% stenosis.  The  sequential limb was then visualized in the mid segment going to the OM2  portion, but there was 99% stenosis in the sequential limb distally  diffusely prior to the anastomosis into the OM vessel.  The  OM2 vessel  after giving rise from the AV groove circumflex proximal to the limb of  the graft had diffuse 90-95% stenosis.   The native right coronary artery was totally occluded proximally.  There  was very faint antegrade collaterals to the mid segment.  There were  more extensive left-to-right collaterals arising from the left coronary  injection supplying the PD, PLA, and distal RCA.   The saphenous vein graft supplying the left internal mammary artery was  widely patent.  The LAD filled proximally and gave rise to proximal  septal perforating arteries.  The vein graft and the LAD were free of  significant disease.   The most cephalad marker was the origin of the free left internal  mammary artery graft to the high diagonal vessel.  Upon further  inspection, it seen to arise from the ostium of the vein graft, which  had supplied the OM1 and OM2, but this graft was occluded and  consequently it had only seen like this was a prerenal graft arising  from this marker.  The free RIMA graft hooked into the diagonal vessel  and this was free of significant disease.   The vein graft supplying the right coronary artery was totally occluded  at its origin.   The left internal mammary artery itself was on a pedicle and then  appeared to be ligated in the mid segment.   Central aortography did not demonstrate any additional grafts arising  from the aorta.   Biplane cine left ventriculography revealed low normal LV function with  an EF of 50%.  On the RAO projection, there was mild upper anterolateral  hypocontractility and small area of focal mid to basal inferior  hypocontractility.  On the LAO projection, there was mild  hypocontractility in the upper posterolateral wall.   Distal aortography was done.  There was no evidence for aneurysm  formation.  The left renal artery was very large.  The right renal  artery was much smaller in size and appeared to have mild 20% proximal   narrowing.   IMPRESSION:  1. Low normal left ventricular function with mild upper mid      posterolateral hypocontractility and small focal region of mid      basal inferior hypocontractility.  2. Significant native coronary artery disease with total occlusion of      the left anterior descending at its ostium, 40% ostial circumflex      stenosis with diffuse 99 and 95% stenosis in the high first obtuse      marginal  artery vessel and diffuse 90-95% stenosis in the proximal      to mid portion of the second obtuse marginal artery vessel with      evidence for some filling of the sequential limb from the first      obtuse marginal artery to the second obtuse marginal artery, but      with diffuse 99% stenosis in the distal aspect of this sequential      graft.  3. Total occlusion of the right coronary artery very proximally just      beyond the ostium with very faint antegrade collaterals and more      extensive left-to-right collaterals supplying the distal right      coronary artery.  4. Total occlusion of the vein graft supplying the right coronary      artery.  5. Patent saphenous vein graft supplying the left anterior descending      artery.  6. Patent free left internal mammary artery graft supplying the high      diagonal vessel and apparently this graft initially arose from the      hood at the ostium of the vein and marker, which had supplied the      first obtuse marginal artery and second obtuse marginal artery      vessel sequentially, but this was apparently occluded.   RECOMMENDATIONS:  Joshua Hahn has documented occlusion of his  sequential OM1 and OM-2 graft as well as vein graft to his right  coronary artery.  His LAD and diagonal system remained intact.  His RCA  is well collateralized.  Cineangiograms will be reviewed with  colleagues.  Consideration may be possible intervention to the native  circumflex vessels versus medical therapy.            ______________________________  Nicki Guadalajara, M.D.     TK/MEDQ  D:  06/23/2008  T:  06/24/2008  Job:  [id number]   cc:   Stacie Glaze, MD  Cardiac Catheterization Laboratory

## 2010-09-19 NOTE — Discharge Summary (Signed)
NAME:  Joshua Hahn, Joshua Hahn NO.:  [id number]   MEDICAL RECORD NO.:  [id number]          PATIENT TYPE:  INP   LOCATION:  6730                         FACILITY:  [facility]   PHYSICIAN:  Sean A. Everardo All, MD    DATE OF BIRTH:  07/19/1945   DATE OF ADMISSION:  12/09/2008  DATE OF DISCHARGE:  12/12/2008                               DISCHARGE SUMMARY   REASON FOR ADMISSION:  Urosepsis.   HISTORY OF PRESENT ILLNESS:  A 65 year old man admitted by Dr. Adela Glimpse  on December 09, 2008, with urosepsis.  Please refer to her dictated history  and physical for details.   HOSPITAL COURSE:  The patient was admitted and treated with intravenous  Rocephin.  He continued to have some symptoms, so he was changed to  intravenous Cipro.  The Enterobacter that was found in the outpatient  culture was sensitive to Cipro.  By December 12, 2008, the patient was  alert, oriented, eating his usual diet, ambulatory, and asymptomatic.  He was thus discharged home in fair condition.   He had a mild increase in his creatinine to 1.8 during his  hospitalization which was stable.   Other chronic medical problems are not active during the  hospitalization.   DIAGNOSES AT THE TIME OF DISCHARGE:  1. Urosepsis, resolved.  2. Mild renal insufficiency.  3. Other chronic medical problems as noted in the history and      physical.   MEDICATIONS:  Same as on the history and physical except he will take  Cipro 500 mg twice a day for another week.   FOLLOW UP:  He already has an appointment with an urologist tomorrow.   ACTIVITY:  Increase slowly.   DIET:  Heart healthy.      Sean A. Everardo All, MD  Electronically Signed     SAE/MEDQ  D:  12/12/2008  T:  12/13/2008  Job:  [id number]

## 2010-09-19 NOTE — Discharge Summary (Signed)
NAME:  Joshua Hahn, Joshua Hahn NO.:  [id number]   MEDICAL RECORD NO.:  [id number]          PATIENT TYPE:  INP   LOCATION:  2918                         FACILITY:  [facility]   PHYSICIAN:  Nicki Guadalajara, M.D.     DATE OF BIRTH:  1946/01/10   DATE OF ADMISSION:  06/22/2008  DATE OF DISCHARGE:  06/27/2008                               DISCHARGE SUMMARY   DISCHARGE DIAGNOSES:  1. Unstable angina.  2. Progressive coronary artery disease status post cardiac      catheterization by Dr. Nicki Guadalajara on June 23, 2008 with      findings of a totaled saphenous vein graft to his right coronary      artery; free left internal mammary artery graft to his diagonal,      patent; saphenous vein graft to his first obtuse marginal artery,      and second obtuse marginal artery was occluded.  He had a 40% left      main.  Circumflex will multiple blockages, high grade.  3. Status post intervention on June 25, 2008 with high-speed      rotational atherectomy and a stent to his proximal left circumflex,      second obtuse marginal artery and a percutaneous transluminal      coronary angioplasty to his first obtuse marginal artery.  A XIENCE      stent 2.5 x 28 mm was put in his proximal circumflex and a Taxus      monorail 2.25 x 32 placed in his second obtuse marginal artery.      Ejection fraction of 50%.  4. Prior history of coronary bypass grafting in 1996.  5. Hypertension.  6. Hyperlipidemia.   LABORATORY STUDIES:  CK-MB and troponin's were all negative.  A lipid  profile showed a total cholesterol was 258, triglycerides 456, HDL was  39, LDL not calculated.  Hemoglobin A1c was 6.  TSH was 1.772, magnesium  2.3, sodium 136, potassium 4.3, chloride 105, CO2 23, glucose 122, BUN  5, creatinine 1.85.  On admission, his creatinine was 1.26.  His  creatinine on June 27, 2008 of 1.85 is the highest postprocedure.  Hemoglobin 11.5, hematocrit 33.3, WBC 6.9, and platelets 174.  Chest  x-  ray showed cardiomegaly without evidence for acute pulmonary  abnormality.   PROCEDURES:  Cardiac catheterization June 23, 2008 by Dr. Tresa Endo.  Cardiac catheterization with intervention on June 25, 2008 by Dr.  Tresa Endo.  It was a complex procedure, please see his full note for  complete details.   DISCHARGE MEDICATIONS:  1. Proscar 5 mg daily.  2. Flomax 0.4 mg daily.  3. Aspirin 81 mg two a day.  4. Metoprolol 25 mg twice per day.  5. Plavix 75 mg a day.  6. Niaspan 500 mg at bedtime.  7. Crestor 40 mg a day.  8. Lovaza 1 g two twice per day.  9. Ranexa 500 mg twice per day.  10.Imdur 60 mg a day.  11.Nitroglycerin 1/150 one under the tongue every 5 minutes x3 if      needed for chest pain.  12.Altace 2.5  mg once per day.  It is not to be started until after he      has lab work done.  He should have his blood drawn on Wednesday.      He should stop taking his Altace and Lipitor.  He will have an      appointment to see Dr. Tresa Endo in 7-10 days.  He should do no      strenuous activity, lifting, pushing, pulling, or exercise for 1      week.   HOSPITAL COURSE:  Viola Placeres came to the emergency room on June 22, 2008.  He was seen by Dr. Susa Griffins.  He had complaints of  chest pain.  He had been a prior patient of Dr. Nicki Guadalajara and had not  seen him, I believe, since April 2007.  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  He apparently started having  chest pain about 3 a.m.  It was 7/10 at its worse.  In the emergency  room, he was complaining of 6/10 chest pain.  He was started on IV  heparin, IV nitroglycerin and admitted to the hospital as unstable  angina and to rule out MI.  He was seen by Dr. Tresa Endo the following day  and he underwent cardiac catheterization.  He was found to have  progressive disk disease as described above.  It was decided that he  should undergo intervention.  However, Dr. Tresa Endo wanted to review  the  films with his colleagues plus because he wanted to hydrate him prior to  the intervention because it appeared that it would be a complex  procedure with probably a substantial amount of dye used.  Thus, he was  hydrated until June 25, 2008 when he was taken back to the lab for  intervention.  Please see Dr. Landry Dyke note for complete details.  He  basically had HSRA and a stent to his proximal left circumflex and OM-2  and then he had a PTCA to his OM-1.  Dr. Tresa Endo uses a XIENCE stent in  his proximal circumflex and a Taxus stent in his OM-2.  Postprocedure,  his creatinine bumped a little bit.  His Altace was held.  Medications  were adjusted and Dr. Tresa Endo felt he was ready for discharge on June 27, 2008.  His blood pressure was 123/70, heart rate was 63,  respirations were 24, O2 sats were 98% on room air.  He was seen by  Cardiac Rehab, walked in the halls, and he was referred to Cardiac Rehab  phase II.      Lezlie Octave, N.P.    ______________________________  Nicki Guadalajara, M.D.    BB/MEDQ  D:  06/27/2008  T:  06/27/2008  Job:  [id number]   cc:   Stacie Glaze, MD

## 2010-09-19 NOTE — H&P (Signed)
NAME:  Joshua, Hahn NO.:  [id number]   MEDICAL RECORD NO.:  [id number]          PATIENT TYPE:  INP   LOCATION:  3310                         FACILITY:  [facility]   PHYSICIAN:  Richard A. Alanda Amass, M.D.DATE OF BIRTH:  1945-05-18   DATE OF ADMISSION:  06/22/2008  DATE OF DISCHARGE:                              HISTORY & PHYSICAL   Joshua Hahn is a 65 year old African American male patient who  came to the emergency room after having chest pain, which started at 3  a.m.  He finally called our office late in the afternoon and he was  referred to the ER.  He was last seen in our office in April 2007.  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  He does have a history of coronary artery  disease.  He underwent CABG x5 on Sep 14, 1992, by Dr. Tyrone Sage.  He had  a free LIMA to his large diagonal and had CT to his LAD.  He had  sequential SVG to his OM-1 and OM-2 and an SVG to his PDA.  He states he  has had no recent chest pain.  He started having chest pain at about 3  a.m.  It was 7/10 at worse, 6/10 currently in the emergency room.  He  does have shortness of breath with it.  No diaphoresis and no radiation.   PRIOR MEDICAL HISTORY:  1. ACVD, CABG x5 on Sep 14, 1992, by Dr. Tyrone Sage, grafts as above.  2. Hypertension.  3. Hyperlipidemia.  4. BPH.   CURRENT MEDICATIONS:  1. Proscar 5 mg every day.  2. Altace 10 mg every day.  3. Lipitor 20 mg every day.  4. Flomax 0.4 mg every day.  5. Ginkgo 120 mg every day.  6. Psyllium every day.  7. Aspirin 81 mg every day.   FAMILY HISTORY:  Positive for coronary artery disease with two brothers  having coronary disease in less than age 52.  Mother has some rhythm  problems in her 48s.   SOCIAL HISTORY:  He is married.  Has 5 grandchildren and 2 children.  He  quit smoking 35 years ago.  He  is a Optician, dispensing.  He does some missionary  work Administrator buildings in [place] and other parts of [place] in 2007  and 2008.   REVIEW OF SYSTEMS:  He denies any fever, cold, or chills.  No headache,  sinus problems, etc.  No skin lesions.  Positive chest pain.  Positive  shortness of breath.  Positive PND.  Positive dysuria.  Positive  arthralgias of his 2 fingers with a little bit of joint swelling.  No  nausea, vomiting, diarrhea, or black stools.  No GERD.  Other systems  are negative.   PHYSICAL EXAMINATION:  VITAL SIGNS:  His blood pressure is 142/89, pulse  is 61, respiration is 20, temperature is 97.  GENERAL:  He is not in any acute distress.  He is  normocephalic.  His  pupils are equal and round.  Sclerae are clear.  No adenopathy.  No  lymphadenopathy.  HEART:  Sounds are regular.  S1 and S2 are present.  No murmur or gallop  is noted.  He has 2+ pulses radial and no carotid bruits.  LUNGS:  Clear to auscultation bilaterally with good inspiratory effort.  SKIN:  No rash, no lesions.  ABDOMEN:  Bowel sounds present x4.  Abdomen is mildly obese.  EXTREMITIES:  No lower extremity edema.  Moves all extremities x4.  NEUROLOGIC:  Alert and oriented x3.   His x-ray showed no active disease, post CABG changes.   LABORATORY DATA:  His hemoglobin is 15.4, hematocrit is 45.2, WBC 6.7,  and platelets 210.  Other labs are still pending.  His EKG is without  any acute ST elevation.  Sinus rhythm at rate of 68.   ASSESSMENT:  1. Unstable angina, rule out myocardial infarction.  2. Known coronary artery disease with a coronary artery bypass      grafting x5 on Sep 14, 1992.  Last Myoview was done in April 2007.      It was an exercise Myoview.  He had no ischemia.  He had good      exercise tolerance.  3. Hyperlipidemia.  4. Hypertension.  5. Benign prostatic hypertrophy.   PLAN:  He was seen and evaluated by Dr. Susa Griffins who recommends  heparin, nitroglycerin, aspirin,  beta-blocker, and probable cath  tomorrow.      Lezlie Octave, N.P.      Richard A. Alanda Amass, M.D.  Electronically Signed    BB/MEDQ  D:  06/22/2008  T:  06/23/2008  Job:  [id number]   cc:   Stacie Glaze, MD  Nicki Guadalajara, M.D.

## 2010-09-19 NOTE — H&P (Signed)
NAME:  Joshua Hahn, Joshua Hahn NO.:  [id number]   MEDICAL RECORD NO.:  [id number]          PATIENT TYPE:  EMS   LOCATION:  MAJO                         FACILITY:  [facility]   PHYSICIAN:  Michiel Cowboy, MDDATE OF BIRTH:  1945/09/30   DATE OF ADMISSION:  12/09/2008  DATE OF DISCHARGE:                              HISTORY & PHYSICAL   ATTENDING PHYSICIAN:  Dr. Adela Glimpse.   PRIMARY CARE PHYSICIAN:  Dr. Lovell Sheehan.   CARDIOLOGIST:  Dr. Tresa Endo.   The patient is a 65 year old gentleman with history of coronary artery  disease, hypertension and hyperlipidemia who 2 days ago presented to the  emergency department with abdominal pain and was found to have likely  prostatitis and urinary tract infection.  He was started on Cipro but  continued to not feel well quite well with some nausea, although no  vomiting and continues to have low grade fevers and also hematuria, at  which point he presented to the emergency department and now being  admitted for outpatient therapy failure.   The patient overall prior to this has been in good health.  His symptoms  started 2 days ago with above-mentioned complaints.  He has mild back  pain.  No vomiting but just some nausea.  No chest pain, no shortness of  breath.  Low grade fevers.  Here, his temperature is up to 100.9, but  yesterday it was 102.  He noticed worsening hematuria today.  He has had  some trouble with urination for quite some time and taking finasteride  and Flomax at home.  Supposed to see urology in the next week.   Otherwise review of systems is unremarkable.   PAST MEDICAL HISTORY:  1. Significant for coronary artery disease, status post CABG in 1992      with history of MI, followed by Dr. Tresa Endo  2. Hypertension.  3. Hypercholesteremia.  4. Patient is status post stent to his proximal left circumflex in      February 2010.  5. Prostatic hypertrophy.   SOCIAL HISTORY:  The patient does not smoke but currently  drinking.  He  used to smoke 40 years ago.   FAMILY HISTORY:  Significant for father with coronary artery disease and  mother with diabetes and stroke.  Two brothers with early onset of  coronary artery disease in their 48s.   ALLERGIES:  No known drug allergies.   MEDICATIONS:  1. Crestor 40 mg daily.  2. Flomax 0.4 mg daily.  3. Finasteride 5 mg daily.  4. Isosorbide 60 mg CR daily.  5. Plavix 75 mg daily.  6. Toprol 25 mg daily.  7. Aspirin 81 mg b.i.d.  8. Niaspan 1000 mg at night.  9. Ranexa 500 mg daily.  10.Fish oil 1000 mg b.i.d.  11.Diovan 80 mg daily.   PHYSICAL EXAMINATION:  VITALS:  Temperature 100.9, blood pressure  118/74, now up to 123/67, pulse 91, respirations 16, satting 94% on room  air.  The patient appears to be in no acute distress.  Head nontraumatic.  Slightly dryish mucous membranes.  LUNGS:  Clear to auscultation bilaterally.  HEART: Regular rate  and rhythm.  No murmurs appreciated.  ABDOMEN:  Some suprapubic tenderness noted.  Blood in the urine.  LOWER EXTREMITIES:  Without clubbing, cyanosis or edema.  No  costovertebral angle tenderness noted.  SKIN:  Clean, dry and intact.   LABS:  White blood cell count 9.8, down from 16 two days ago.  Hemoglobin 12.6.  Sodium 132, potassium 4.8, creatinine up from 1.8 two  days ago to 2.14.  UA from the 3rd showed 21 to 50 white blood cell  count and urine grew Enterobacter sensitive to ceftriaxone and Cipro and  Septra.  Today UA showing 7 to 10 white blood cells, too numerous to  count red blood cells, and rare bacteria.   CT scan showing no stone and no hydrocephalus.  Is a very small right  kidney and hypertrophied left kidney.  There is some bladder wall  thickening which could be acute-on-chronic, and there is moderate BPH  with some possibility of chronic bladder obstruction.   ASSESSMENT/PLAN:  The patient is a pleasant 65 year old gentleman with a  history of BPH, now with urinary tract  infection and acute renal  failure.  1. Urinary tract infection/early pyelonephritis:  Give Rocephin IV,      although his Enterobacter was sensitive to Cipro, he apparently      needs a few days of IV antibiotics.  The CT scan did not show any      perinephric abscess, hydrodistention, or stone, which is      encouraging.  2. Benign prostatic hypertrophy:  Continue finasteride and Flomax.      Would need urology evaluation.  Consider inpatient consult if there      is any evidence of  significant post-void residual, which we will      measure.  If there is no evidence of significant obstruction, I      would lean against using the Foley since he has an active urinary      tract infection which we need to clear and also already hematuria,      now worrisome for some evidence of raised post-void residual, then      a Foley would be more helpful.  There is no evidence of      hydronephrosis on CT scan, which is encouraging.  3. Acute renal failure:  His creatinine seemed to have gone up from 3      days to now.  Sounds like it could be dehydration, especially given      low sodium.  No evidence of __________ is somewhat encouraging.  Of      note, his creatinine after February has been 1.85 in the past.  I      wonder if he has some degree of renal insufficiency, and perhaps      his baseline currently is around 1.8, at which point a rise to 2.14      is not as significant, but we will give gentle IV fluids and make      sure he does not have evidence of obstruction.  4. Low sodium, likely secondary to dehydration.  Will give IV fluids.      Check urine lytes.  5. Hyperlipidemia.  Continue Crestor.  6. Coronary artery disease:  Appears to be stable.  No chest pain, no      shortness of breath.  7. Hypertension.  Hold diet, given renal function and potassium      slightly on the high side.  We will continue metoprolol  holding      parameters.  8. Prophylaxis:  Protonix and SCDs, given  hematuria.  9. Hematuria, likely secondary to cystitis versus prostatitis.  If      does not improve, urological evaluation would be warranted.  Will      continue Plavix, as the patient has recently had stents.  Would      hold off on fish oil and aspirin or at discharge, would decrease at      least aspirin to daily instead of b.i.d. if possible, until      hematuria resolves.      Michiel Cowboy, MD  Electronically Signed     AVD/MEDQ  D:  12/09/2008  T:  12/10/2008  Job:  [id number]   cc:   Nicki Guadalajara, M.D.  Dr. Lovell Sheehan

## 2010-09-19 NOTE — Cardiovascular Report (Signed)
NAME:  [name], Hahn NO.:  [id number]   MEDICAL RECORD NO.:  [id number]          PATIENT TYPE:  INP   LOCATION:  2918                         FACILITY:  [facility]   PHYSICIAN:  Nicki Guadalajara, M.D.     DATE OF BIRTH:  [birth year]   DATE OF PROCEDURE:  DATE OF DISCHARGE:                            CARDIAC CATHETERIZATION   INDICATIONS:  Joshua Hahn is a 65 year old African American male who  has known coronary artery disease.  In 1988, he underwent PTCA of his  distal right coronary artery.  In 1994 while in [place], he suffered  an inferior wall myocardial infarction, was treated with thrombolytic  therapy and ultimately transported to [facility] where  catheterization revealed progressive CAD.  On Sep 14, 1992, he underwent  bypass surgery with a free left internal mammary artery graft to a high  large first diagonal vessel, the saphenous vein graft to his LAD, the  saphenous vein graft to the first and second marginal vessel, and a  saphenous vein graft to the right coronary artery.  The patient has a  history of hypertension and hyperlipidemia.  He has been doing fairly  well, but was admitted to [facility] on June 22, 2008, with  unstable angina.  Cardiac catheterization was done by me on June 23, 2008.  This showed low normal LV function with mild upper mid  posterolateral hypocontractility with small focal region of basal  inferior hypocontractility.  He has significant native coronary artery  disease with total occlusion of the LAD at its ostium.  He had diffuse  AV groove circumflex stenoses with 40%, 50%, 80% stenoses.  There was  99% stenosis very diffusely in the OM-1 vessel and 95 diffuse stenosis  in the OM-2 vessel.  The distal limb of the graft was faintly opacified  between the OM-1 and OM-2, but there was subtotal occlusion.  The  patient had an occluded proximal limb of the graft from the aorta and  consequently, the  entire left circumflex territory was in jeopardy.  In  addition, he had native RCA was occluded and a distal RCA was  collateralized from his circumflex vessel proximal to the OM-2 takeoff,  which also were jeopardized by the diffuse disease.  He had a patent-  free LIMA graft to his diagonal vessel and had a patent vein graft to  his LAD.  The patient was aggressively hydrated following his  catheterization.  His ACE inhibitor was held.  He also was given  Mucomyst.  Options were discussed with the patient and it was felt  attempt at complex intervention to his native circumflex system would be  his best option.  Risks and benefits were discussed in detail including  probable need for high-speed rotational atherectomy due to the  diffuseness and of the anatomy.  He and his family members agreed to  proceed and he now presents today for this complex intervention.   PROCEDURE:  After premedication with Valium 5 mg, he also received 25 mg  of fentanyl.  His right femoral artery was punctured and a 7-French  sheath was  inserted and a 7-French sheath was also inserted into the  right femoral vein.  A 300 mg of Plavix was administered.  Double bolus  Integrilin and weight-adjusted heparinization were administered.  A  temporary pacemaker was advanced through the venous sheath to the RV  apex.  This was done since the patient was going to have a rotational  atherectomy procedure and his RCA was collateralized by circumflex  system and for probable rotational-atherectomy-induced bradyarrhythmia.  A Rotafloppy wire was then advanced down the circumflex vessel via a 7-  [place] Q-4 curve catheter.  Initially, a 1.5 bur was inserted and  numerous runs in the proximal circumflex were made.  Ultimately, the 1.5  bur was able to cross the 95% diffuse stenosis in the OM-2 vessel.  Several polishing runs were performed.  The 1.5 bur was then removed by  Dynaglide technique and a 1.75 bur was inserted.   Rotational atherectomy  was done in the proximal circumflex with the 1.75 bur, but it was felt  that this should not be done in the smaller OM vessel.  At this point  with improved lumen, a loose wire was then advanced down and was able to  cross the 99% diffuse stenosis in the OM-1 vessel.  A Prowater wire was  advanced down the AV groove circumflex into the OM-2 vessel and once  this was passed distally, the Rotafloppy wire was removed.  Initially, a  2.0 sprinter balloon was inserted, but this was unable to cross the OM-1  stenosis.  The balloon was then passed over the Prowater and dilatations  were done throughout the entire circumflex proximally and distal OM-2  vessel at sites of prior rotational atherectomy.  This balloon was then  removed and at 1.5 sprinter balloon was then inserted and this was  ultimately able to cross the ostial 99% stenosis at the OM-1.  Multiple  dilatations were made with this 1.5 balloon.  This was then removed and  the 2.0 balloon was then reinserted and multiple dilatations again were  made in the OM-1 vessel with the 2-0 balloon.  The Luge wire was then  removed with the balloon and a 2.5 x 28 mm XIENCE V stent was then  inserted and covered from the ostium of the circumflex distally, which  was just proximal to the OM-2 takeoff.  Two dilatations at 14  atmospheres were done for stent deployment.  This resulted in  significant improvement proximally.  There was narrowing with reduced  TIMI flow down the OM-1 vessel following stent deployment.  A new  whisper wire was then successfully inserted across the stent struts into  the OM-1 vessel.  The 2-0 balloon which had previously been inserted was  reinserted, but this was unable to cross and consequently a new 1.5 x 20  mm balloon was used.  This was able to cross the OM-1 ostium and  multiple dilatations were made proximally.  This balloon was then  removed and a 2.0 x 3 mm apex balloon was then inserted  and multiple  dilatations were made in the OM1 vessel with good angiographic result.  Attention was then directed at the OM-2 vessel and since this was felt  to be smaller and too small for 2.5 stent, a 2.25 Taxus Adam x 32 mm  stent, which was paclitaxel drug-eluting was then inserted with careful  overlap to the distal aspect of the more proximally placed the XIENCE  stent.  This was dilated to 13 atmospheres  x2.  Poststent dilatation was  done in the OM-2 vessel utilizing a 2.5 x 20 mm Quantum balloon.  A 2.75  x 20 mm noncompliant sprinter balloon was used for poststent dilatation  proximally.  [facility] angiography confirmed an excellent angiographic  result.  During the procedure, the patient received over 1000 mcg of  intracoronary nitroglycerin, he was maintained on his IV nitroglycerin  drip, and received approximately 200 mL/hr of intravenous fluids.  He  tolerated procedure well.  At the completion of the procedure, the  temporary pacemaker was removed.  He left the catheterization laboratory  painfree with stable hemodynamics.   HEMODYNAMIC DATA:  Central aortic pressure was 133/67.   ANGIOGRAPHIC DATA:  Please refer to the patient's cardiac  catheterization study of June 23, 2008.   The left main coronary artery essentially gave rise to the circumflex  vessel since the LAD was subtotal total occluded at its ostium.  There  was diffuse 50% followed by 80% narrowing in the proximal circumflex in  the region of the OM-1 takeoff with diffuse long area of 99% stenosis in  the OM-1 vessel before a subtotally occluded segmental graft, which  extended partially to the OM-2 site.  The AV groove also had 50%  narrowing in the region where the collaterals arose supplying the distal  RCA.  The OM-2 vessel had diffuse narrowing of 95-99% followed by 30%  narrowing.  Following a long arduous procedure over 3-1/2 hours with  complex intervention including high-speed rotational  atherectomy of the  proximal circumflex and OM-2 vessel with PTCA of the OM-1 and PTCA and  stenting of the circumflex proximally and the OM-2 vessel.  The diffuse  80% proximal circumflex stenoses was reduced to 0%.  The diffuse 99% OM-  1 stenosis was reduced to 20%.  The mid circumflex was reduced from 50%  to 0% and a distal OM-2 vessel was improved from 99% to 0% with the 2.25  Taxus Adam stent postdilated 2.36.  The proximal circumflex XIENCE 2.5 x  28 mm stent was postdilated with a 2.75 mm.   IMPRESSION:  Complex percutaneous coronary intervention involving  multiple diffuse stenoses in the proximal mid circumflex vessel obtuse  marginal 1 and obtuse marginal 2 with high-speed rotational atherectomy  of the proximal mid circumflex and obtuse marginal 2 vessel,  percutaneous transluminal coronary angioplasty of the obtuse marginal 1  vessel and percutaneous transluminal coronary angioplasty and stenting  of the proximal mid and obtuse marginal 2 vessel with the  atrioventricular groove circumflex and obtuse marginal 2 vessel being  reduced to 0% following high-speed rotational atherectomy, percutaneous  transluminal coronary angioplasty, and stenting in  the obtuse marginal 1 vessel being reduced to 20% with percutaneous  transluminal coronary angioplasty done with double bolus  Integrilin/weight-adjusted heparinization, intracoronary and intravenous  nitroglycerin, and aspirin/Plavix.           ______________________________  Nicki Guadalajara, M.D.     TK/MEDQ  D:  06/25/2008  T:  06/26/2008  Job:  [id number]   cc:   Stacie Glaze, MD

## 2011-05-10 DIAGNOSIS — I2510 Atherosclerotic heart disease of native coronary artery without angina pectoris: Secondary | ICD-10-CM | POA: Diagnosis not present

## 2011-05-10 DIAGNOSIS — Z95818 Presence of other cardiac implants and grafts: Secondary | ICD-10-CM | POA: Diagnosis not present

## 2011-05-10 DIAGNOSIS — I10 Essential (primary) hypertension: Secondary | ICD-10-CM | POA: Diagnosis not present

## 2011-05-10 DIAGNOSIS — E782 Mixed hyperlipidemia: Secondary | ICD-10-CM | POA: Diagnosis not present

## 2011-05-10 DIAGNOSIS — Z79899 Other long term (current) drug therapy: Secondary | ICD-10-CM | POA: Diagnosis not present

## 2011-05-10 DIAGNOSIS — R5383 Other fatigue: Secondary | ICD-10-CM | POA: Diagnosis not present

## 2011-05-15 DIAGNOSIS — I2510 Atherosclerotic heart disease of native coronary artery without angina pectoris: Secondary | ICD-10-CM | POA: Diagnosis not present

## 2011-05-15 DIAGNOSIS — E785 Hyperlipidemia, unspecified: Secondary | ICD-10-CM | POA: Diagnosis not present

## 2011-05-15 DIAGNOSIS — I119 Hypertensive heart disease without heart failure: Secondary | ICD-10-CM | POA: Diagnosis not present

## 2011-05-28 DIAGNOSIS — C61 Malignant neoplasm of prostate: Secondary | ICD-10-CM | POA: Diagnosis not present

## 2011-05-28 DIAGNOSIS — E559 Vitamin D deficiency, unspecified: Secondary | ICD-10-CM | POA: Diagnosis not present

## 2011-05-30 DIAGNOSIS — N529 Male erectile dysfunction, unspecified: Secondary | ICD-10-CM | POA: Diagnosis not present

## 2011-05-30 DIAGNOSIS — R972 Elevated prostate specific antigen [PSA]: Secondary | ICD-10-CM | POA: Diagnosis not present

## 2011-05-30 DIAGNOSIS — N401 Enlarged prostate with lower urinary tract symptoms: Secondary | ICD-10-CM | POA: Diagnosis not present

## 2011-05-30 DIAGNOSIS — C61 Malignant neoplasm of prostate: Secondary | ICD-10-CM | POA: Diagnosis not present

## 2011-06-14 IMAGING — CT CT PELVIS W/O CM
1 of 4 series · 15 of 36 positions shown, 19 images · non-contrast
Comparison: Report dated 03/23/2000.

CT ABDOMEN

CLINICAL DATA: Gross hematuria.  Urinary tract infection.  Renal
insufficiency.

CT OF THE ABDOMEN AND PELVIS WITHOUT CONTRAST (CT UROGRAM)
TECHNIQUE: Multidetector CT imaging was performed through the
abdomen and pelvis to include the urinary tract.

[Series 2: stone <(id) >(id) · axial · 0.84mm/px · z∈[-494,-44]mm · 15 of 100 slices shown, 19 images]
[im 5/100  soft-tissue]
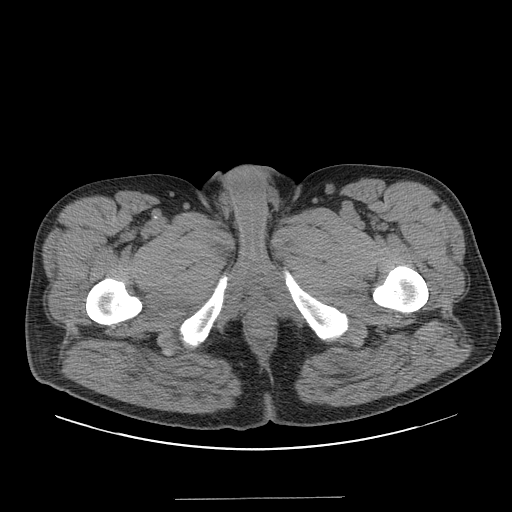
[im 5/100  bone]
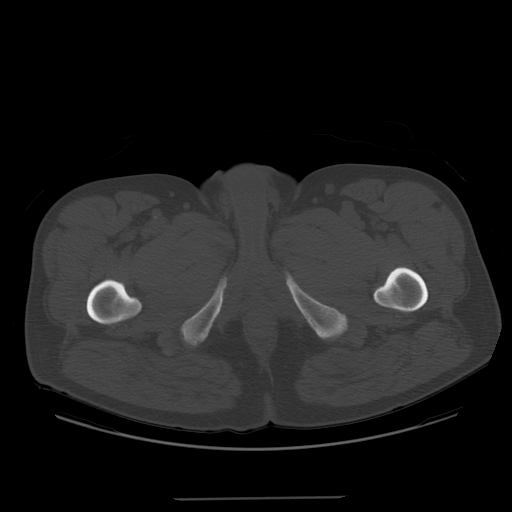
[im 13/100  soft-tissue]
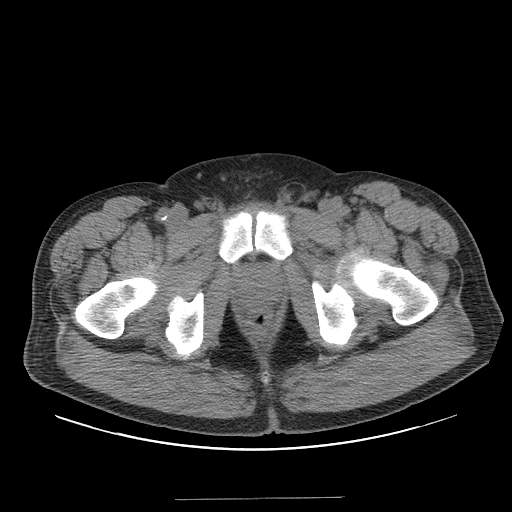
[im 22/100  soft-tissue]
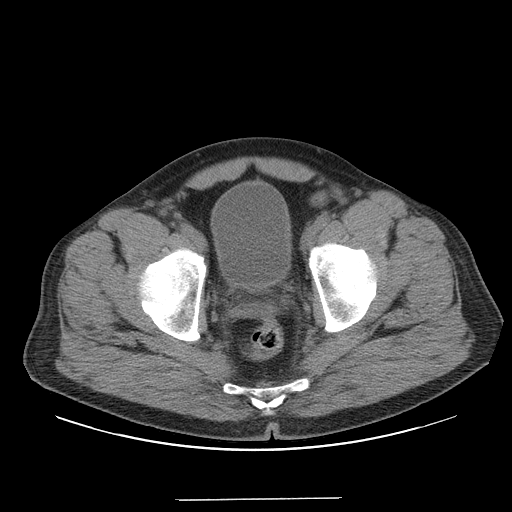
[im 26/100  soft-tissue]
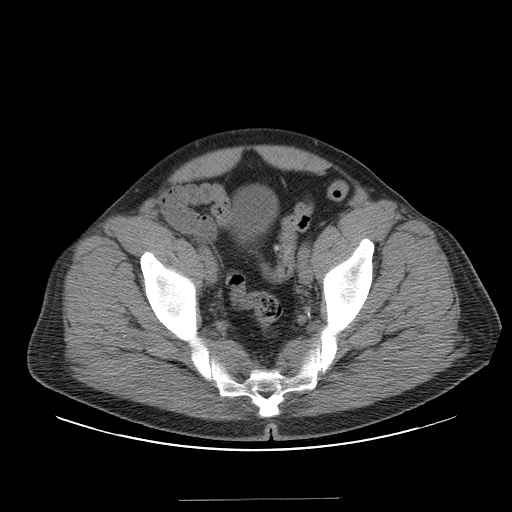
[im 35/100  soft-tissue]
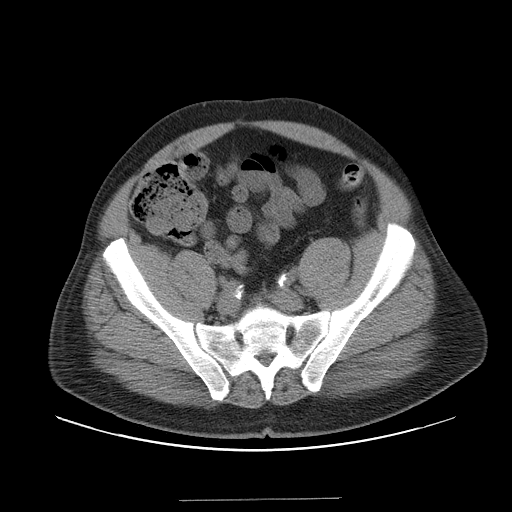
[im 44/100  soft-tissue]
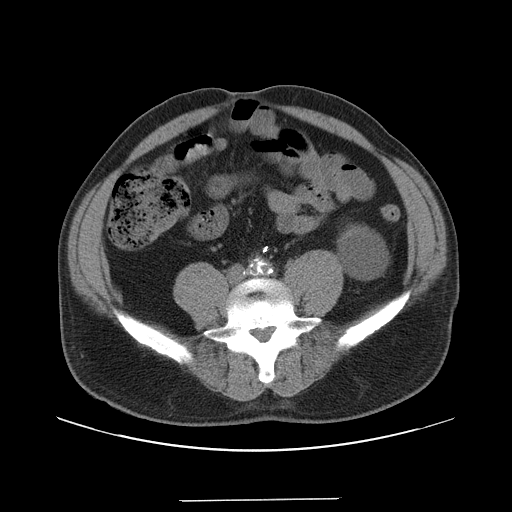
[im 52/100  soft-tissue]
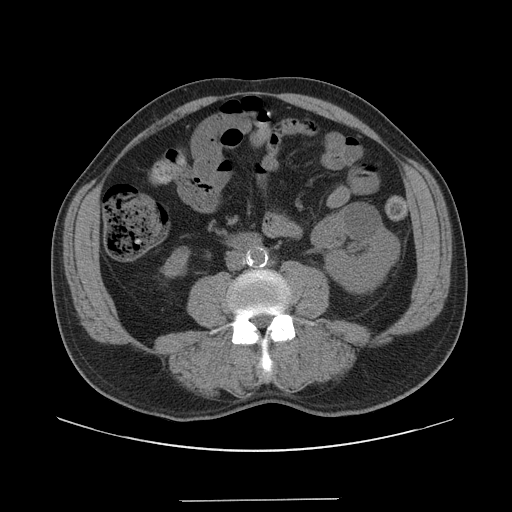
[im 56/100  soft-tissue]
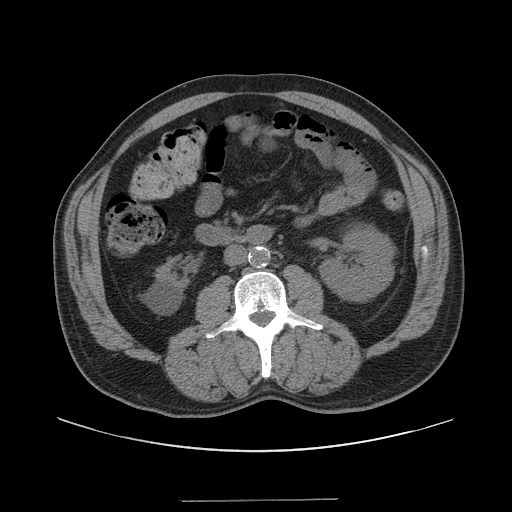
[im 65/100  soft-tissue]
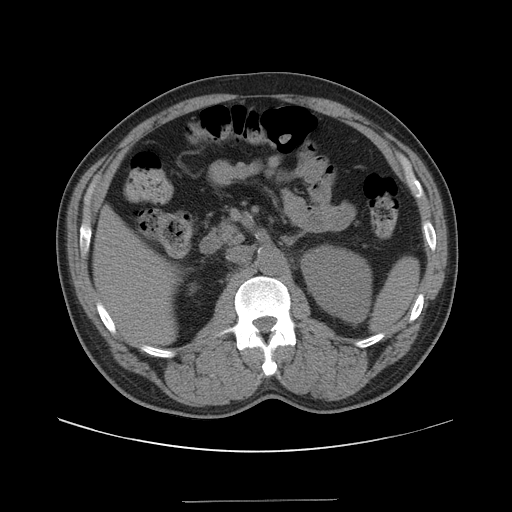
[im 65/100  bone]
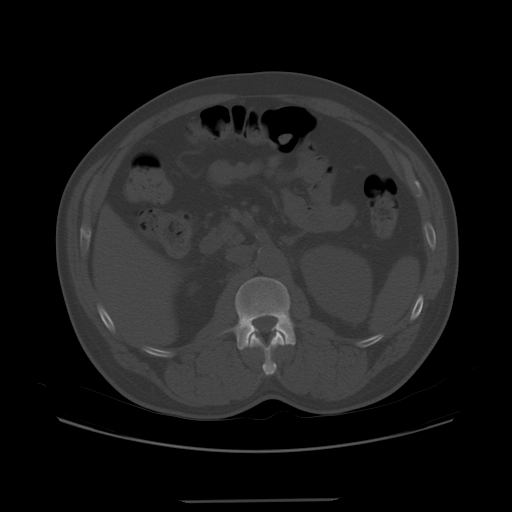
[im 74/100  soft-tissue]
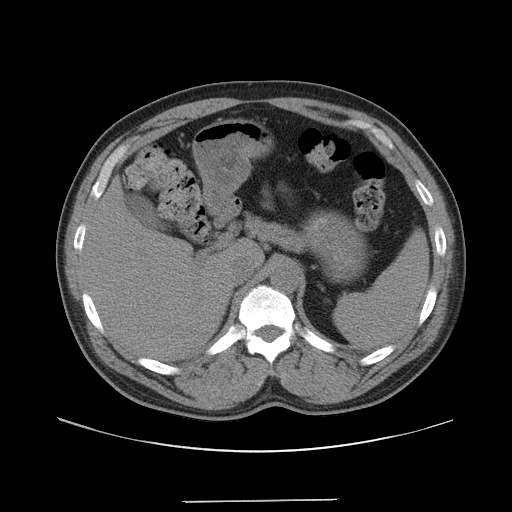
[im 78/100  soft-tissue]
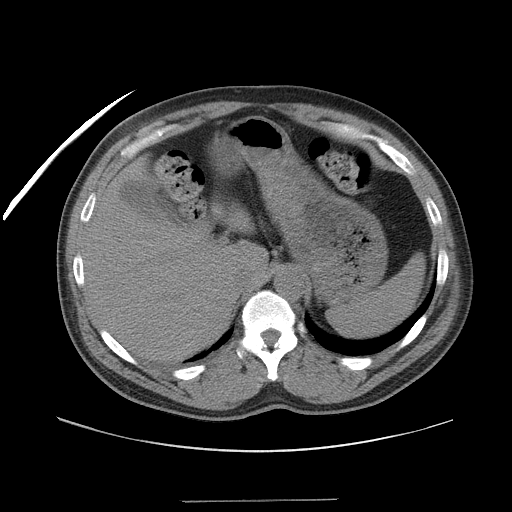
[im 82/100  lung]
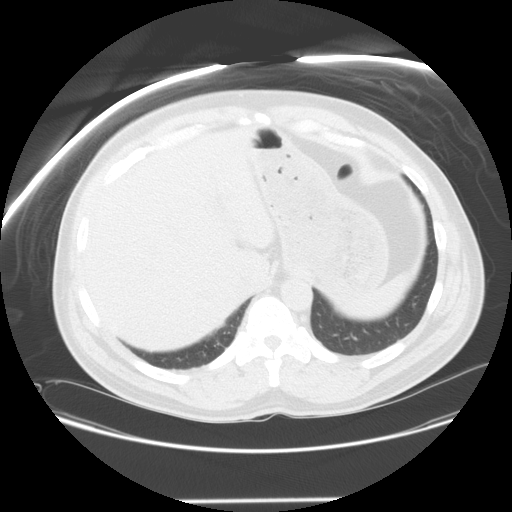
[im 87/100  soft-tissue]
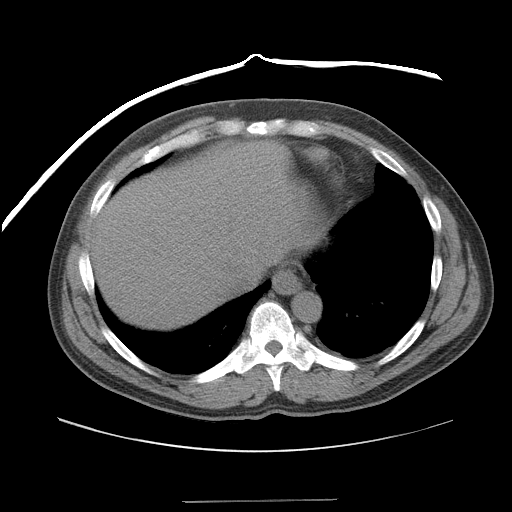
[im 87/100  lung]
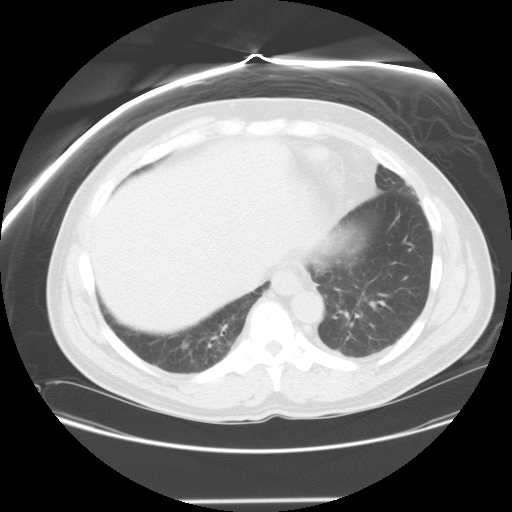
[im 91/100  lung]
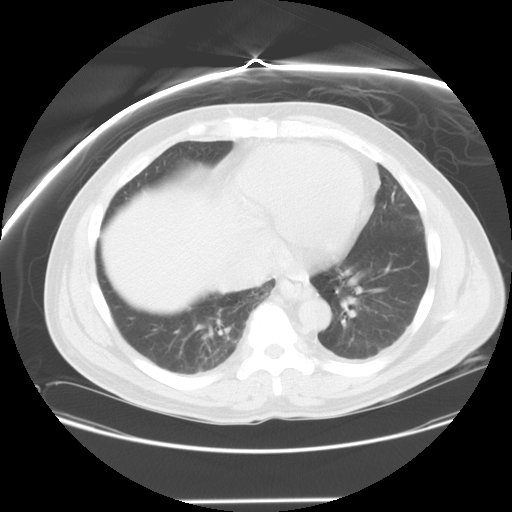
[im 95/100  soft-tissue]
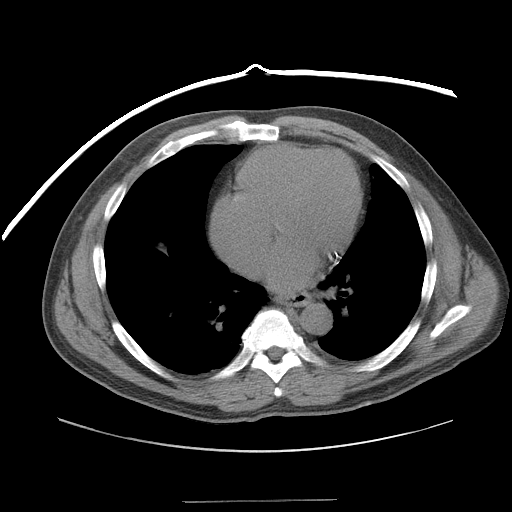
[im 95/100  lung]
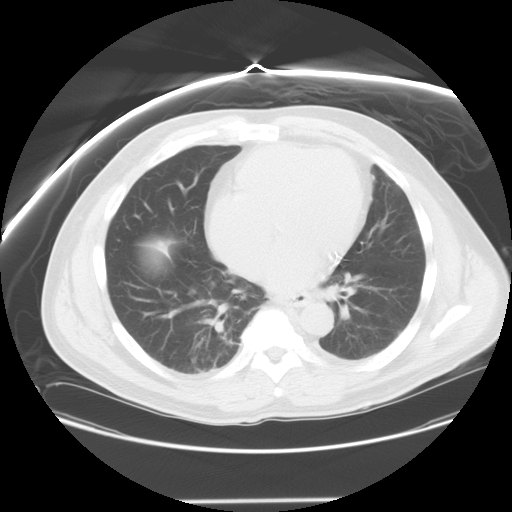

[15 of 36 positions shown; findings below may reference images not displayed]

FINDINGS: Very small right kidney containing a 3.8 cm simple
appearing exophytic cyst.  Small right renal parenchymal
calcification.  Enlarged left kidney containing a 3.2 cm cyst as
well as a 4.9 cm cyst.  No renal or ureteral calculi and no
hydronephrosis.  Unremarkable liver, spleen, pancreas, gallbladder
and adrenal glands.  No gastrointestinal abnormalities.  Surgical
clips anterior to the aortic bifurcation.  Dense atheromatous
aortic calcifications.  There are also atheromatous coronary artery
calcifications.  Borderline enlarged heart.  Linear atelectasis or
scarring at the right lung base.  Post CABG changes.  Lower lumbar
spine degenerative changes.  Mild lower thoracic spine degenerative
changes.
IMPRESSION: 1.  Marked right renal atrophy with compensatory hypertrophy of the
left kidney.
2.  Small right renal cortical calcification.
3.  Bilateral renal cysts.
4.  No acute abnormality.

CT PELVIS
FINDINGS: Moderately enlarged prostate gland.  Mild diffuse
bladder wall thickening.   No bladder or ureteral calculi seen.
Small number of sigmoid colon diverticula without evidence of
diverticulitis.  No evidence of appendicitis.  Small left inguinal
hernia containing fat.  No enlarged lymph nodes.  Atheromatous
arterial calcifications.  Unremarkable bones.
IMPRESSION: 1.  Moderately enlarged prostate gland.
2.  Mild diffuse bladder wall thickening, most likely due to
chronic bladder outlet obstruction by the enlarged prostate gland.
3.  Mild sigmoid diverticulosis.
4.  Small left inguinal hernia containing fat.

## 2011-09-13 DIAGNOSIS — H40029 Open angle with borderline findings, high risk, unspecified eye: Secondary | ICD-10-CM | POA: Diagnosis not present

## 2011-10-16 DIAGNOSIS — I2510 Atherosclerotic heart disease of native coronary artery without angina pectoris: Secondary | ICD-10-CM | POA: Diagnosis not present

## 2011-10-16 DIAGNOSIS — I10 Essential (primary) hypertension: Secondary | ICD-10-CM

## 2011-10-16 HISTORY — DX: Essential (primary) hypertension: I10

## 2011-10-25 ENCOUNTER — Encounter: Payer: Self-pay | Admitting: Internal Medicine

## 2012-02-26 ENCOUNTER — Encounter: Payer: Self-pay | Admitting: *Deleted

## 2012-02-27 ENCOUNTER — Ambulatory Visit (INDEPENDENT_AMBULATORY_CARE_PROVIDER_SITE_OTHER): Payer: Medicare Other | Admitting: Internal Medicine

## 2012-02-27 ENCOUNTER — Encounter: Payer: Self-pay | Admitting: Internal Medicine

## 2012-02-27 VITALS — BP 140/80 | HR 76 | Temp 98.3°F | Resp 16 | Ht 71.0 in | Wt 216.0 lb

## 2012-02-27 DIAGNOSIS — E785 Hyperlipidemia, unspecified: Secondary | ICD-10-CM

## 2012-02-27 DIAGNOSIS — T887XXA Unspecified adverse effect of drug or medicament, initial encounter: Secondary | ICD-10-CM | POA: Diagnosis not present

## 2012-02-27 NOTE — Patient Instructions (Addendum)
The patient is instructed to continue all medications as prescribed. Schedule followup with check out clerk upon leaving the clinic  

## 2012-02-27 NOTE — Progress Notes (Signed)
  Subjective:    Patient ID: Joshua Hahn, male    DOB: [birth year], 66 y.o.   MRN: [id number]  HPI Increased jopint and hand pain GERD Mole on chest Increased phlegm and allergies Off exercise and eating poorly    Review of Systems  Constitutional: Negative for fever and fatigue.  HENT: Negative for hearing loss, congestion, neck pain and postnasal drip.   Eyes: Negative for discharge, redness and visual disturbance.  Respiratory: Negative for cough, shortness of breath and wheezing.   Cardiovascular: Negative for leg swelling.  Gastrointestinal: Negative for abdominal pain, constipation and abdominal distention.  Genitourinary: Negative for urgency and frequency.  Musculoskeletal: Negative for joint swelling and arthralgias.  Skin: Negative for color change and rash.  Neurological: Negative for weakness and light-headedness.  Hematological: Negative for adenopathy.  Psychiatric/Behavioral: Negative for behavioral problems.       Objective:   Physical Exam  Nursing note and vitals reviewed. Constitutional: He appears well-developed and well-nourished.  HENT:  Head: Normocephalic and atraumatic.  Eyes: Conjunctivae normal are normal. Pupils are equal, round, and reactive to light.  Neck: Normal range of motion. Neck supple.  Cardiovascular: Normal rate and regular rhythm.   Pulmonary/Chest: Effort normal and breath sounds normal.  Abdominal: Soft. Bowel sounds are normal.          Assessment & Plan:  Follow up mole on chest.. Accessory nipple Osteoarthritis of hands Check lipid, liver and bmet for htn, lipids follow up

## 2012-02-28 LAB — HEPATIC FUNCTION PANEL
ALT: 19 U/L (ref 0–53)
Bilirubin, Direct: 0 mg/dL (ref 0.0–0.3)
Total Protein: 7.5 g/dL (ref 6.0–8.3)

## 2012-02-28 LAB — LIPID PANEL
Total CHOL/HDL Ratio: 5
VLDL: 46 mg/dL — ABNORMAL HIGH (ref 0.0–40.0)

## 2012-02-28 LAB — BASIC METABOLIC PANEL
BUN: 9 mg/dL (ref 6–23)
CO2: 28 mEq/L (ref 19–32)
Chloride: 105 mEq/L (ref 96–112)
Creatinine, Ser: 1.6 mg/dL — ABNORMAL HIGH (ref 0.4–1.5)

## 2012-02-28 LAB — CBC WITH DIFFERENTIAL/PLATELET
Basophils Relative: 0.7 % (ref 0.0–3.0)
Eosinophils Relative: 2 % (ref 0.0–5.0)
Hemoglobin: 14.8 g/dL (ref 13.0–17.0)
Lymphocytes Relative: 34.8 % (ref 12.0–46.0)
Monocytes Relative: 10 % (ref 3.0–12.0)
Neutro Abs: 3.7 10*3/uL (ref 1.4–7.7)
RBC: 4.87 Mil/uL (ref 4.22–5.81)
WBC: 7.1 10*3/uL (ref 4.5–10.5)

## 2012-02-29 LAB — LDL CHOLESTEROL, DIRECT: Direct LDL: 135.5 mg/dL

## 2012-03-17 DIAGNOSIS — H43399 Other vitreous opacities, unspecified eye: Secondary | ICD-10-CM | POA: Diagnosis not present

## 2012-03-17 DIAGNOSIS — H02839 Dermatochalasis of unspecified eye, unspecified eyelid: Secondary | ICD-10-CM | POA: Diagnosis not present

## 2012-03-17 DIAGNOSIS — H2510 Age-related nuclear cataract, unspecified eye: Secondary | ICD-10-CM | POA: Diagnosis not present

## 2012-03-17 DIAGNOSIS — H40029 Open angle with borderline findings, high risk, unspecified eye: Secondary | ICD-10-CM | POA: Diagnosis not present

## 2012-05-29 DIAGNOSIS — C61 Malignant neoplasm of prostate: Secondary | ICD-10-CM | POA: Diagnosis not present

## 2012-05-29 DIAGNOSIS — E559 Vitamin D deficiency, unspecified: Secondary | ICD-10-CM | POA: Diagnosis not present

## 2012-06-26 ENCOUNTER — Encounter: Payer: Self-pay | Admitting: *Deleted

## 2012-06-27 DIAGNOSIS — Z79899 Other long term (current) drug therapy: Secondary | ICD-10-CM | POA: Diagnosis not present

## 2012-06-27 DIAGNOSIS — E782 Mixed hyperlipidemia: Secondary | ICD-10-CM | POA: Diagnosis not present

## 2012-06-30 DIAGNOSIS — I119 Hypertensive heart disease without heart failure: Secondary | ICD-10-CM | POA: Diagnosis not present

## 2012-06-30 DIAGNOSIS — E785 Hyperlipidemia, unspecified: Secondary | ICD-10-CM | POA: Diagnosis not present

## 2012-06-30 DIAGNOSIS — I2510 Atherosclerotic heart disease of native coronary artery without angina pectoris: Secondary | ICD-10-CM | POA: Diagnosis not present

## 2012-07-14 ENCOUNTER — Emergency Department (INDEPENDENT_AMBULATORY_CARE_PROVIDER_SITE_OTHER)
Admission: EM | Admit: 2012-07-14 | Discharge: 2012-07-14 | Disposition: A | Discharge status: Home / Self Care | Payer: Medicare Other | Source: Home / Self Care

## 2012-07-14 ENCOUNTER — Encounter (HOSPITAL_COMMUNITY): Payer: Self-pay | Admitting: Emergency Medicine

## 2012-07-14 DIAGNOSIS — J45909 Unspecified asthma, uncomplicated: Secondary | ICD-10-CM | POA: Diagnosis not present

## 2012-07-14 DIAGNOSIS — J069 Acute upper respiratory infection, unspecified: Secondary | ICD-10-CM

## 2012-07-14 MED ORDER — TRIAMCINOLONE ACETONIDE 40 MG/ML IJ SUSP
INTRAMUSCULAR | Status: AC
  Filled 2012-07-14: qty 5

## 2012-07-14 MED ORDER — PREDNISONE 20 MG PO TABS: ORAL_TABLET | ORAL | Status: DC

## 2012-07-14 MED ORDER — TRIAMCINOLONE ACETONIDE 40 MG/ML IJ SUSP
40.0000 mg | Freq: Once | INTRAMUSCULAR | Status: AC
  Administered 2012-07-14: 40 mg via INTRAMUSCULAR

## 2012-07-14 MED ORDER — ALBUTEROL SULFATE (5 MG/ML) 0.5% IN NEBU
5.0000 mg | INHALATION_SOLUTION | Freq: Once | RESPIRATORY_TRACT | Status: AC
  Administered 2012-07-14: 5 mg via RESPIRATORY_TRACT

## 2012-07-14 MED ORDER — ALBUTEROL SULFATE HFA 108 (90 BASE) MCG/ACT IN AERS: 2.0000 | INHALATION_SPRAY | RESPIRATORY_TRACT | Status: DC | PRN

## 2012-07-14 MED ORDER — ALBUTEROL SULFATE (5 MG/ML) 0.5% IN NEBU
INHALATION_SOLUTION | RESPIRATORY_TRACT | Status: AC
  Filled 2012-07-14: qty 1

## 2012-07-14 MED ORDER — IPRATROPIUM BROMIDE 0.02 % IN SOLN
0.5000 mg | Freq: Once | RESPIRATORY_TRACT | Status: AC
  Administered 2012-07-14: 0.5 mg via RESPIRATORY_TRACT

## 2012-07-14 NOTE — ED Provider Notes (Signed)
History     CSN: [id number]  Arrival date & time 07/14/12  1825   None     Chief Complaint  Patient presents with  . URI    (Consider location/radiation/quality/duration/timing/severity/associated sxs/prior treatment) HPI Comments: 67 year old male presents with 3-4 days of upper respiratory congestion, runny nose, sore throat and nasal congestion. Approximately 2-3 days ago he developed pain across his anterior chest described as sharp sometimes and other times pressure. He is elicited with cough and deep breathing. He has been having cough spasms frequently in hearing himself wheeze. He states he forces himself to breathe shallow so he can diminish his cough and chest pain. He has a history of hypertension, dyslipidemia, coronary artery disease and sleep apnea. In June of 2013 echocardiogram demonstrated an EF of 50-55% and otherwise  Normal left ventricular function.   Past Medical History  Diagnosis Date  . Hypertension   . Hyperlipidemia     2012 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 levels LDL IIIa+b% and HDL2b%  . Coronary artery disease 05/10/2011    Lexiscan- EF54% basal to mid inferior scar. no reversible ischemia  hypokinesis  . HTN (hypertension) 10/16/2011    Echo -EF 50-55% ,LV NORMAL  . Sleep apnea, obstructive     using C-PAP  . Myocardial infarction 1994    infer[facility]  . Coronary artery disease 1988    initial PTCA distal RCA    Past Surgical History  Procedure Laterality Date  . Coronary artery bypass graft  1994  . Cardiac catheterization  06/23/2008    occlusion sequential OM1 andOM2 graft as well vein graft to RCA.LAD and diagonal system remain intact. RCA  collateralized  . Percutaneous coronary stent intervention (pci-s)  06/25/2008    rotational atherectomy w/diffuse disease native circ. w/drug eluting stents , PTCA on one vessel; grat occlusion to RCA w/native RCA  proxim. occlusion. Circ.supplied collaaterals distal RCA  following intervention  . Left parotid gland surgry  05/2009    Dr. Lazarus Salines  . Prostatectomy  06/2009    robotic-asssisted laparoscopic radical by Dr Earlene Plater    No family history on file.  History  Substance Use Topics  . Smoking status: Never Smoker   . Smokeless tobacco: Not on file  . Alcohol Use: Not on file      Review of Systems  Constitutional: Positive for activity change. Negative for fever, diaphoresis and fatigue.  HENT: Positive for congestion, sore throat, rhinorrhea and postnasal drip. Negative for ear pain, facial swelling, trouble swallowing, neck pain and neck stiffness.   Eyes: Negative for pain, discharge and redness.  Respiratory: Positive for cough and wheezing. Negative for chest tightness and shortness of breath.   Cardiovascular: Positive for chest pain. Negative for palpitations and leg swelling.  Gastrointestinal: Negative.   Musculoskeletal: Negative.   Skin: Negative.   Neurological: Negative.   Psychiatric/Behavioral: Negative.     Allergies  Review of patient's allergies indicates no known allergies.  Home Medications   Current Outpatient Rx  Name  Route  Sig  Dispense  Refill  . ascorbic acid (VITAMIN C) 1000 MG tablet   Oral   Take 800 mg by mouth daily.         Marland Kitchen aspirin 81 MG tablet   Oral   Take 81 mg by mouth daily.         . cholecalciferol (VITAMIN D) 1000 UNITS tablet   Oral   Take 1,000  Units by mouth daily.         . clopidogrel (PLAVIX) 75 MG tablet   Oral   Take 75 mg by mouth daily.         . finasteride (PROSCAR) 5 MG tablet   Oral   Take 5 mg by mouth daily.         Marland Kitchen omega-3 acid ethyl esters (LOVAZA) 1 G capsule   Oral   Take 2 g by mouth 2 (two) times daily.         . ranolazine (RANEXA) 500 MG 12 hr tablet   Oral   Take 500 mg by mouth daily.         . valsartan (DIOVAN) 80 MG tablet   Oral   Take 80 mg by mouth daily.          . vitamin B-12 (CYANOCOBALAMIN) 100 MCG tablet   Oral   Take 50 mcg by mouth daily.         . Vitamin D, Ergocalciferol, (DRISDOL) 50000 UNITS CAPS   Oral   Take 50,000 Units by mouth.         Marland Kitchen albuterol (PROVENTIL HFA;VENTOLIN HFA) 108 (90 BASE) MCG/ACT inhaler   Inhalation   Inhale 2 puffs into the lungs every 4 (four) hours as needed for wheezing.   1 Inhaler   0   . co-enzyme Q-10 30 MG capsule   Oral   Take 30 mg by mouth daily.         . metoprolol succinate (TOPROL-XL) 50 MG 24 hr tablet   Oral   Take 25 mg by mouth daily. Take with or immediately following a meal.         . niacin (NIASPAN) 500 MG CR tablet   Oral   Take 500 mg by mouth at bedtime.         . nitroGLYCERIN (NITROSTAT) 0.4 MG SL tablet   Sublingual   Place 0.4 mg under the tongue every 5 (five) minutes as needed for chest pain.         . predniSONE (DELTASONE) 20 MG tablet      2 tabs po once daily x 3 days   6 tablet   0   . rosuvastatin (CRESTOR) 40 MG tablet   Oral   Take 40 mg by mouth daily.           BP 156/76  Temp(Src) 98.9 F (37.2 C) (Oral)  Resp 34  SpO2 98%  Physical Exam  Nursing note and vitals reviewed. Constitutional: He is oriented to person, place, and time. He appears well-developed and well-nourished. No distress.  HENT:  Bilateral TMs are normal Oropharynx with minor erythema and scant clear PND. Otherwise no swelling or exudates.  Eyes: Conjunctivae and EOM are normal.  Neck: Normal range of motion. Neck supple.  Cardiovascular: Normal rate, regular rhythm and normal heart sounds.   Pulmonary/Chest: Effort normal. No respiratory distress.  Inspirations primarily clear but he has trouble taking a deep breath due to coughing spasms. During a cough there is diffuse coarseness and wheezing bilaterally.  Musculoskeletal: Normal range of motion. He exhibits no edema.  Lymphadenopathy:    He has no cervical adenopathy.  Neurological: He is alert and  oriented to person, place, and time.  Skin: Skin is warm and dry. No rash noted.  Psychiatric: He has a normal mood and affect.    ED Course  Procedures (including critical care time)  Labs Reviewed - No  data to display No results found.   1. RAD (reactive airway disease) with wheezing   2. URI (upper respiratory infection)       MDM  The patient was administered a duo neb and injection of Kenalog 40 mg IM. Post neb the patient is feeling much better. His lungs are perfectly clear no wheezing, good air movement and chest expansion. Has no more cough. He is given a prescription for prednisone 20 mg 2 every day for 3 days and albuterol HFA 2 puffs every 4-6 hours when necessary cough and wheeze. Also advised to take either of Claritin or Allegra daily for the next 2 days. For any worsening new symptoms or problems such as shortness of breath, cough or fever should be rechecked promptly. May return  Hayden Rasmussen, NP 07/14/12 2056

## 2012-07-14 NOTE — ED Notes (Signed)
Pt is here c/o cold sx x3 days Sx include: SOB, cough w/yellow phlegm, chest discomfort due to cough, wheezing, fevers Denies: v/n/d Taking OTC cough meds w/little relief Hx of HTN and open heart surgery  He is alert and oriented w/no signs of acute distress.

## 2012-07-16 NOTE — ED Provider Notes (Signed)
Medical screening examination/treatment/procedure(s) were performed by resident physician or non-physician practitioner and as supervising physician I was immediately available for consultation/collaboration.   KINDL,JAMES DOUGLAS MD.   James D Kindl, MD 07/16/12 2040 

## 2012-08-06 ENCOUNTER — Encounter: Payer: Self-pay | Admitting: Cardiovascular Disease

## 2012-09-02 DIAGNOSIS — C61 Malignant neoplasm of prostate: Secondary | ICD-10-CM | POA: Diagnosis not present

## 2012-09-30 ENCOUNTER — Other Ambulatory Visit: Payer: Self-pay | Admitting: Cardiovascular Disease

## 2012-10-02 DIAGNOSIS — H40029 Open angle with borderline findings, high risk, unspecified eye: Secondary | ICD-10-CM | POA: Diagnosis not present

## 2012-11-17 ENCOUNTER — Ambulatory Visit (INDEPENDENT_AMBULATORY_CARE_PROVIDER_SITE_OTHER): Payer: Medicare Other | Admitting: Emergency Medicine

## 2012-11-17 ENCOUNTER — Ambulatory Visit: Payer: Medicare Other

## 2012-11-17 VITALS — BP 118/78 | HR 60 | Temp 98.1°F | Resp 16 | Ht 69.5 in | Wt 215.2 lb

## 2012-11-17 DIAGNOSIS — S8011XA Contusion of right lower leg, initial encounter: Secondary | ICD-10-CM

## 2012-11-17 DIAGNOSIS — M25569 Pain in unspecified knee: Secondary | ICD-10-CM

## 2012-11-17 DIAGNOSIS — S8010XA Contusion of unspecified lower leg, initial encounter: Secondary | ICD-10-CM

## 2012-11-17 DIAGNOSIS — M25561 Pain in right knee: Secondary | ICD-10-CM

## 2012-11-17 LAB — POCT CBC
HCT, POC: 48.9 % (ref 43.5–53.7)
Hemoglobin: 15.3 g/dL (ref 14.1–18.1)
Lymph, poc: 2.3 (ref 0.6–3.4)
MCH, POC: 30.3 pg (ref 27–31.2)
MCHC: 31.3 g/dL — AB (ref 31.8–35.4)
MCV: 96.8 fL (ref 80–97)
MPV: 9.8 fL (ref 0–99.8)
POC MID %: 8.8 %M (ref 0–12)
WBC: 7.8 10*3/uL (ref 4.6–10.2)

## 2012-11-17 MED ORDER — CEPHALEXIN 500 MG PO CAPS: 500.0000 mg | ORAL_CAPSULE | Freq: Two times a day (BID) | ORAL | Status: DC

## 2012-11-17 NOTE — Patient Instructions (Addendum)
Keep leg elevated and iced 3 times a day for 10 minute intervals.  Hematoma A hematoma is a pocket of blood that collects under the skin, in an organ, in a body space, in a joint space, or in other tissue. The blood can clot to form a lump that you can see and feel. The lump is often firm, sore, and sometimes even painful and tender. Most hematomas get better in a few days to weeks. However, some hematomas may be serious and require medical care.Hematomas can range in size from very small to very large. CAUSES  A hematoma can be caused by a blunt or penetrating injury. It can also be caused by leakage from a blood vessel under the skin. Spontaneous leakage from a blood vessel is more likely to occur in elderly people, especially those taking blood thinners. Sometimes, a hematoma can develop after certain medical procedures. SYMPTOMS  Unlike a bruise, a hematoma forms a firm lump that you can feel. This lump is the collection of blood. The collection of blood can also cause your skin to turn a blue to dark blue color. If the hematoma is close to the surface of the skin, it often produces a yellowish color in the skin. DIAGNOSIS  Your caregiver can determine whether you have a hematoma based on your history and a physical exam. TREATMENT  Hematomas usually go away on their own over time. Rarely does the blood need to be drained out of the body. HOME CARE INSTRUCTIONS   Put ice on the injured area.  Put ice in a plastic bag.  Place a towel between your skin and the bag.  Leave the ice on for 15-20 minutes, 3-4 times a day for the first 1 to 2 days.  After the first 2 days, switch to using warm compresses on the hematoma.  Elevate the injured area to help decrease pain and swelling. Wrapping the area with an elastic bandage may also be helpful. Compression helps to reduce swelling and promotes shrinking of the hematoma. Make sure the bandage is not wrapped too tight.  If your hematoma is on a  lower extremity and is painful, crutches may be helpful for a couple days.  Only take over-the-counter or prescription medicines for pain, discomfort, or fever as directed by your caregiver. Most patients can take acetaminophen or ibuprofen for the pain. SEEK IMMEDIATE MEDICAL CARE IF:   You have increasing pain, or your pain is not controlled with medicine.  You have a fever.  You have worsening swelling or discoloration.  Your skin over the hematoma breaks or starts bleeding. MAKE SURE YOU:   Understand these instructions.  Will watch your condition.  Will get help right away if you are not doing well or get worse. Document Released: 12/06/2003 Document Revised: 07/16/2011 Document Reviewed: 12/25/2010 [facility] Patient Information 2014 [employer], [place].  and iced 3 times a day for 10 minute intervals.

## 2012-11-17 NOTE — Progress Notes (Signed)
  Subjective:    Patient ID: Joshua Hahn, male    DOB: [birth year], 67 y.o.   MRN: [id number]  HPIPatient comes into our office with right lower leg pain He hit it on a knob that's on back of his truck Last Monday  Right calf surgery in 1994 of a open heart surgery Swollen and warm to the touch on lower leg haven't been using any heat or ice for injury hasn't gotten any worst or better haven't used any medicine for pain No fever no chills no nausea   Review of Systems     Objective:   Physical Exam Results for orders placed in visit on 11/17/12  POCT CBC      Result Value Range   WBC 7.8  4.6 - 10.2 K/uL   Lymph, poc 2.3  0.6 - 3.4   POC LYMPH PERCENT 28.9  10 - 50 %L   MID (cbc) 0.7  0 - 0.9   POC MID % 8.8  0 - 12 %M   POC Granulocyte 4.9  2 - 6.9   Granulocyte percent 62.3  37 - 80 %G   RBC 5.05  4.69 - 6.13 M/uL   Hemoglobin 15.3  14.1 - 18.1 g/dL   HCT, POC 40.9  81.1 - 53.7 %   MCV 96.8  80 - 97 fL   MCH, POC 30.3  27 - 31.2 pg   MCHC 31.3 (*) 31.8 - 35.4 g/dL   RDW, POC 91.4     Platelet Count, POC 211  142 - 424 K/uL   MPV 9.8  0 - 99.8 fL   There is swelling over the anterior tibia of the right leg. There is no posterior calf tenderness. There is pain with flexion extension of the right ankle.  [facility] reading (PRIMARY) by  Dr. Cleta Alberts There surgical clips are present. There is no evidence of any bony injury.       Assessment & Plan:    Will cover with cephalexin for 7 days he is to keep his leg elevated and iced during the day.

## 2012-11-17 NOTE — Progress Notes (Deleted)
  Subjective:    Patient ID: Joshua Hahn, male    DOB: Dec 20, 1945, 67 y.o.   MRN: [id number]  HPI    Review of Systems     Objective:   Physical Exam Results for orders placed in visit on 11/17/12  POCT CBC      Result Value Range   WBC 7.8  4.6 - 10.2 K/uL   Lymph, poc 2.3  0.6 - 3.4   POC LYMPH PERCENT 28.9  10 - 50 %L   MID (cbc) 0.7  0 - 0.9   POC MID % 8.8  0 - 12 %M   POC Granulocyte 4.9  2 - 6.9   Granulocyte percent 62.3  37 - 80 %G   RBC 5.05  4.69 - 6.13 M/uL   Hemoglobin 15.3  14.1 - 18.1 g/dL   HCT, POC 40.9  81.1 - 53.7 %   MCV 96.8  80 - 97 fL   MCH, POC 30.3  27 - 31.2 pg   MCHC 31.3 (*) 31.8 - 35.4 g/dL   RDW, POC 91.4     Platelet Count, POC 211  142 - 424 K/uL   MPV 9.8  0 - 99.8 fL          Assessment & Plan:

## 2012-12-12 ENCOUNTER — Ambulatory Visit: Payer: Medicare Other | Admitting: Cardiovascular Disease

## 2013-01-01 ENCOUNTER — Ambulatory Visit: Payer: Medicare Other | Admitting: Cardiovascular Disease

## 2013-01-09 ENCOUNTER — Ambulatory Visit (INDEPENDENT_AMBULATORY_CARE_PROVIDER_SITE_OTHER): Payer: Medicare Other | Admitting: Cardiovascular Disease

## 2013-01-09 ENCOUNTER — Encounter: Payer: Self-pay | Admitting: Cardiovascular Disease

## 2013-01-09 VITALS — BP 142/86 | HR 57 | Ht 70.0 in | Wt 218.6 lb

## 2013-01-09 DIAGNOSIS — I10 Essential (primary) hypertension: Secondary | ICD-10-CM

## 2013-01-09 DIAGNOSIS — I252 Old myocardial infarction: Secondary | ICD-10-CM

## 2013-01-09 DIAGNOSIS — I119 Hypertensive heart disease without heart failure: Secondary | ICD-10-CM

## 2013-01-09 DIAGNOSIS — I2510 Atherosclerotic heart disease of native coronary artery without angina pectoris: Secondary | ICD-10-CM

## 2013-01-09 DIAGNOSIS — E785 Hyperlipidemia, unspecified: Secondary | ICD-10-CM

## 2013-01-09 DIAGNOSIS — R5381 Other malaise: Secondary | ICD-10-CM | POA: Diagnosis not present

## 2013-01-09 DIAGNOSIS — G4733 Obstructive sleep apnea (adult) (pediatric): Secondary | ICD-10-CM

## 2013-01-09 LAB — COMPREHENSIVE METABOLIC PANEL
AST: 22 U/L (ref 0–37)
Alkaline Phosphatase: 56 U/L (ref 39–117)
BUN: 11 mg/dL (ref 6–23)
Creat: 1.44 mg/dL — ABNORMAL HIGH (ref 0.50–1.35)
Total Bilirubin: 0.7 mg/dL (ref 0.3–1.2)

## 2013-01-09 LAB — CBC
HCT: 43.9 % (ref 39.0–52.0)
MCH: 30.3 pg (ref 26.0–34.0)
MCHC: 34.6 g/dL (ref 30.0–36.0)
MCV: 87.6 fL (ref 78.0–100.0)
RDW: 13.7 % (ref 11.5–15.5)

## 2013-01-09 MED ORDER — ISOSORBIDE MONONITRATE ER 60 MG PO TB24: ORAL_TABLET | ORAL | Status: DC

## 2013-01-09 MED ORDER — RANOLAZINE ER 1000 MG PO TB12: 1000.0000 mg | ORAL_TABLET | Freq: Two times a day (BID) | ORAL | Status: DC

## 2013-01-09 MED ORDER — LOSARTAN POTASSIUM 50 MG PO TABS: 50.0000 mg | ORAL_TABLET | Freq: Every day | ORAL | Status: DC

## 2013-01-09 NOTE — Patient Instructions (Addendum)
Your physician has requested that you have en exercise stress myoview. For further information please visit [email or web address] Please follow instruction sheet, as given.  Your physician recommends that you return for lab work fasting.  Your physician has recommended you make the following change in your medication: INCREASE the Ranexa to 1000 mg twice daily. Increase the Isosorbide to 90 mg daily ( 1 & 1/2 tablet ).  START LOSARTAN 50 mg. These prescription changes has already been sent to your pharmacy.  Your physician recommends that you schedule a follow-up appointment in: 1 month.

## 2013-01-11 ENCOUNTER — Encounter: Payer: Self-pay | Admitting: Cardiovascular Disease

## 2013-01-11 DIAGNOSIS — G4733 Obstructive sleep apnea (adult) (pediatric): Secondary | ICD-10-CM | POA: Insufficient documentation

## 2013-01-11 DIAGNOSIS — I252 Old myocardial infarction: Secondary | ICD-10-CM | POA: Insufficient documentation

## 2013-01-11 DIAGNOSIS — I2510 Atherosclerotic heart disease of native coronary artery without angina pectoris: Secondary | ICD-10-CM | POA: Insufficient documentation

## 2013-01-11 NOTE — Progress Notes (Signed)
Patient ID: Joshua Hahn, male   DOB: March 02, 1946, 67 y.o.   MRN: [id number]     HPI: Joshua Hahn, is a 67 y.o. male who presents for six-month cardiology evaluation.  Joshua Hahn has established coronary artery disease dating back to 1980 which time I performed PTCA of his distal right coronary artery. In 1994 he suffered a major wall myocardial infarction and subsequently was referred for CABG revascularization surgery and had a free LIMA graft inserted into a high large first diagonal vessel, saphenous vein graft to the LAD, saphenous vein graft to the first and second marginal vessels, and saphenous vein graft to the right corner artery. The stripper 2010 he was admitted with unstable angina and was found to have significant native CAD with total occlusion of the LAD at its ostium, diffuse AV groove circumflex stenoses, 99% stenosis in a diffusely diseased a 1 vessel with 95% on 2 stenosis. The distal limb of the graft was opacified between L1 and L2 but had subtotal occlusion. He had an occluded proximal limb of the graft from the aorta and consequently the entire circumflex territory was in jeopardy. He had native RCA occlusion as well as an occluded graft to the RCA appears RCA was collateralized from the circumflex vessel proximal to the one to take off projector side jeopardized by the diffuse disease. It is pain free LIMA graft to the diagonal vessel patent vein graft to the LAD. He 08/23/2008 he underwent complex intervention to his native circumflex system utilizing high-speed rotational atherectomy was diffusely diseased native circumflex vessel ultimate insertion of a 2.5x28 mm I. and stent proximally and 2.25 Taxus stent more distally in tandem.  His last nuclear perfusion study was in January 13 remained low risk but did show previously noted fixed basal to mid inferior scar. CA, status post laparoscopic radical prostatectomy and is also status post Ejection fraction was 54%.  Additional  problems include hypertension, mixed hyperlipidemia, obstructive sleep apnea CPAP therapy, prostate cancer, status post radical prostatectomy laparoscopically performed by Dr. Earlene Hahn and left parotid gland surgery. The past month, he apparently ran out of his Diovan. He has noticed some episodes of shortness of breath and also has noticed episodes of chest pressure with exertion. He denies any prolonged episodes of discomfort or rest symptomatology. He presents for   Past Medical History  Diagnosis Date  . Hypertension   . 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 levels LDL IIIa+b% and HDL2b%  . Coronary artery disease 05/10/2011    Lexiscan- EF54% basal to mid inferior scar. no reversible ischemia  hypokinesis  . HTN (hypertension) 10/16/2011    Echo -EF 50-55% ,LV NORMAL  . Sleep apnea, obstructive     using C-PAP  . Myocardial infarction 1994    infer[facility] wall [facility]  . Coronary artery disease 1988    initial PTCA distal RCA    Past Surgical History  Procedure Laterality Date  . Coronary artery bypass graft  1994  . Cardiac catheterization  06/23/2008    occlusion sequential OM1 andOM2 graft as well vein graft to RCA.LAD and diagonal system remain intact. RCA  collateralized  . Percutaneous coronary stent intervention (pci-s)  06/25/2008    rotational atherectomy w/diffuse disease native circ. w/drug eluting stents , PTCA on one vessel; grat occlusion to RCA w/native RCA proxim. occlusion. Circ.supplied collaaterals distal RCA  following intervention  . Left parotid gland surgry  05/2009    Dr. Lazarus Hahn  . Prostatectomy  06/2009    robotic-asssisted laparoscopic radical by Dr Joshua Hahn    No Known Allergies  Current Outpatient Prescriptions  Medication Sig Dispense Refill  . albuterol (PROVENTIL HFA;VENTOLIN HFA) 108 (90 BASE) MCG/ACT inhaler Inhale 2 puffs into the lungs every 4 (four)  hours as needed for wheezing.  1 Inhaler  0  . ascorbic acid (VITAMIN C) 1000 MG tablet Take 800 mg by mouth daily.      Marland Kitchen aspirin 81 MG tablet Take 81 mg by mouth daily.      . cholecalciferol (VITAMIN D) 1000 UNITS tablet Take 1,000 Units by mouth daily.      . clopidogrel (PLAVIX) 75 MG tablet Take 75 mg by mouth daily.      . Coenzyme Q10 (CO Q 10) 100 MG CAPS Take 100 mg by mouth. 3 tabs      . finasteride (PROSCAR) 5 MG tablet Take 5 mg by mouth daily.      . metoprolol succinate (TOPROL-XL) 50 MG 24 hr tablet Take 25 mg by mouth daily. Take with or immediately following a meal.      . niacin (NIASPAN) 500 MG CR tablet Take 500 mg by mouth at bedtime.      . nitroGLYCERIN (NITROSTAT) 0.4 MG SL tablet Place 0.4 mg under the tongue every 5 (five) minutes as needed for chest pain.      Marland Kitchen omega-3 acid ethyl esters (LOVAZA) 1 G capsule Take 2 g by mouth 2 (two) times daily.      . rosuvastatin (CRESTOR) 40 MG tablet Take 40 mg by mouth daily.      . vitamin B-12 (CYANOCOBALAMIN) 100 MCG tablet Take 50 mcg by mouth daily.      . isosorbide mononitrate (IMDUR) 60 MG 24 hr tablet Take 1 & 1/2 tablet daily  45 tablet  11  . losartan (COZAAR) 50 MG tablet Take 1 tablet (50 mg total) by mouth daily.  60 tablet  11  . ranolazine (RANEXA) 1000 MG SR tablet Take 1 tablet (1,000 mg total) by mouth 2 (two) times daily.  60 tablet  11   No current facility-administered medications for this visit.    History   Social History  . Marital Status: Married    Spouse Name: N/A    Number of Children: 2  . Years of Education: N/A   Occupational History  . pastor    Social History Main Topics  . Smoking status: Never Smoker   . Smokeless tobacco: Never Used  . Alcohol Use: No  . Drug Use: Not on file  . Sexual Activity: Not on file   Other Topics Concern  . Not on file   Social History Narrative  . No narrative on file   Socially he is married. He has 2 children 5 grandchildren. 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 There is no tobacco use. He does travel.  History reviewed. No pertinent family history.  ROS is negative for fevers, chills or night sweats. he denies presyncope or syncope. He has been off his Diovan for one month. He does no more shortness of breath and some chest pressure now compared to when he was on therapy and compared to his last office visit. He denies any prolonged episodes of pain. He denies bleeding. He denies  significant. He denies myalgias. He still gets to utilizing CPAP therapy.   Other system review is negative.  PE BP 142/86  Pulse 57  Ht 5\' 10"  (1.778 m)  Wt 218 lb 9.6 oz (99.156 kg)  BMI 31.37 kg/m2  General: Alert, oriented, no distress.  Skin: normal turgor, no rashes HEENT: Normocephalic, atraumatic. Pupils round and reactive; sclera anicteric;no lid lag.  Nose without nasal septal hypertrophy Mouth/Parynx benign; Mallinpatti scale 3 Neck: No JVD, no carotid briuts Lungs: clear to ausculatation and percussion; no wheezing or rales Heart: RRR, s1 s2 normal  1-2/6 systolic murmur  Abdomen: soft, nontender; no hepatosplenomehaly, BS+; abdominal aorta nontender and not dilated by palpation. Pulses 2+ Extremities: no clubbing cyanosis or edema, Homan's sign negative  Neurologic: grossly nonfocal  ECG: Sinus rhythm at 57 beats per minute. Previously old Q wave in 3 small Q wave in F. Nondiagnostic ST changes   LABS:  BMET    Component Value Date/Time   NA 134* 01/09/2013 0929   K 4.6 01/09/2013 0929   CL 102 01/09/2013 0929   CO2 25 01/09/2013 0929   GLUCOSE 110* 01/09/2013 0929   GLUCOSE 97 03/04/2006 0946   BUN 11 01/09/2013 0929   CREATININE 1.44* 01/09/2013 0929   CREATININE 1.6* 02/27/2012 1718   CALCIUM 9.6 01/09/2013 0929   GFRNONAA 46* 10/26/2009 0440   GFRAA  Value: 55        The eGFR has been  calculated using the MDRD equation. This calculation has not been validated in all clinical situations. eGFR's persistently <60 mL/min signify possible Chronic Kidney Disease.* 10/26/2009 0440     Hepatic Function Panel     Component Value Date/Time   PROT 7.0 01/09/2013 0929   ALBUMIN 4.2 01/09/2013 0929   AST 22 01/09/2013 0929   ALT 19 01/09/2013 0929   ALKPHOS 56 01/09/2013 0929   BILITOT 0.7 01/09/2013 0929   BILIDIR 0.0 02/27/2012 1718     CBC    Component Value Date/Time   WBC 6.0 01/09/2013 0929   WBC 7.8 11/17/2012 1106   RBC 5.01 01/09/2013 0929   RBC 5.05 11/17/2012 1106   HGB 15.2 01/09/2013 0929   HGB 15.3 11/17/2012 1106   HCT 43.9 01/09/2013 0929   HCT 48.9 11/17/2012 1106   PLT 219 01/09/2013 0929   MCV 87.6 01/09/2013 0929   MCV 96.8 11/17/2012 1106   MCH 30.3 01/09/2013 0929   MCH 30.3 11/17/2012 1106   MCHC 34.6 01/09/2013 0929   MCHC 31.3* 11/17/2012 1106   RDW 13.7 01/09/2013 0929   LYMPHSABS 2.5 02/27/2012 1718   MONOABS 0.7 02/27/2012 1718   EOSABS 0.1 02/27/2012 1718   BASOSABS 0.0 02/27/2012 1718     BNP No results found for this basename: probnp    Lipid Panel     Component Value Date/Time   CHOL 205* 02/27/2012 1718   TRIG 230.0* 02/27/2012 1718   HDL 39.40 02/27/2012 1718   CHOLHDL 5 02/27/2012 1718   VLDL 46.0* 02/27/2012 1718   LDLCALC  Value: UNABLE TO CALCULATE IF TRIGLYCERIDE OVER 400 mg/dL        Total Cholesterol/HDL:CHD Risk Coronary Heart Disease Risk Table                     Men   Women  1/2 Average Risk   3.4   3.3  Average Risk       5.0   4.4  2 X Average Risk   9.6  7.1  3 X Average Risk  23.4   11.0        Use the calculated Patient Ratio above and the CHD Risk Table to determine the patient's CHD Risk.        ATP III CLASSIFICATION (LDL):  <100     mg/dL   Optimal  409-811  mg/dL   Near or Above                    Optimal  130-159  mg/dL   Borderline  914-782  mg/dL   High  >956     mg/dL   Very High 06/20/863 7846     RADIOLOGY: No results  found.    ASSESSMENT AND PLAN: Joshua Hahn is now 26 years following his initial PTCA of his distal right coronary artery in 20 years following his if her wall myocardial infarction and subsequent CABG revascularization surgery. He is 40 years following complex rotational atherectomy and diffuse stenting of his native circumflex vessel which also supplied his RCA territory. He had been remaining fairly stable on his current medical regimen. Her, for the past month, he apparently ran out of his Diovan and has not gotten that refilled. I suspect his blood pressure has been somewhat increased as a result. Presently, I am recommending starting him on losartan 50 mg initially since the Diovan apparently was too costly for him to refill. I'm also further titrating his Ranexa to 1000 mg twice a day and increasing his isosorbide mononitrate to 90 mg daily. I am scheduling him for an exercise Myoview study to make certain there is no significant change on his increased medical therapy compared to his last study which was done in January 2013. Laboratory will be checked. I will see him in one month for followup evaluation.     Lennette Bihari, MD, [facility]  01/11/2013 12:28 PM

## 2013-01-12 LAB — NMR LIPOPROFILE WITH LIPIDS
Cholesterol, Total: 273 mg/dL — ABNORMAL HIGH (ref <200)
HDL Particle Number: 30.1 umol/L — ABNORMAL LOW (ref >=30.5)
HDL-C: 45 mg/dL (ref >=40)
LDL (calc): 170 mg/dL — ABNORMAL HIGH (ref <100)
LDL Particle Number: 2733 nmol/L — ABNORMAL HIGH (ref <1000)
LP-IR Score: 65 — ABNORMAL HIGH (ref <=45)
VLDL Size: 42.3 nm (ref <=46.6)

## 2013-01-15 ENCOUNTER — Telehealth (HOSPITAL_COMMUNITY): Payer: Self-pay | Admitting: *Deleted

## 2013-01-19 ENCOUNTER — Telehealth: Payer: Self-pay | Admitting: *Deleted

## 2013-01-19 NOTE — Telephone Encounter (Signed)
Patient called to inform me that he is only taking the crestor 40 mg every other day. Also Dr. Tresa Endo had stopped the niaspan due to him being intolerant to the flushing. Patient also informs me that he is only taking the lovaza 1 a day. Says that he would rather restart the niaspan @ 500 mg QHS the lovaza @ 2 daily  and take the crestor everyday before adding new medication. Informed patient that I will let Dr. Tresa Endo know and will contact him back with any further instructions.

## 2013-01-19 NOTE — Telephone Encounter (Signed)
Left message that his LDL particles were really elevated. Will need to increase the niaspan to 1000 mg.  Add Zetia 10 mg to the crestor that he is presently taking.

## 2013-01-19 NOTE — Telephone Encounter (Signed)
Left message to Call back to discuss cholesterol results.

## 2013-01-20 ENCOUNTER — Other Ambulatory Visit: Payer: Self-pay | Admitting: *Deleted

## 2013-01-20 ENCOUNTER — Telehealth: Payer: Self-pay | Admitting: Cardiovascular Disease

## 2013-01-20 DIAGNOSIS — E785 Hyperlipidemia, unspecified: Secondary | ICD-10-CM

## 2013-01-20 NOTE — Telephone Encounter (Signed)
Informed patient per Dr. Tresa Endo to take his cholesterol medications as he states in the previous message. Recheck labs in about 6 weeks. If numbers still elevated he will need to add zetia. Patient voiced understanding. labslip mailed to patient.

## 2013-01-20 NOTE — Telephone Encounter (Signed)
He wants to call him when she have time.

## 2013-01-20 NOTE — Telephone Encounter (Signed)
Message forwarded to W. Waddell, CMA.  

## 2013-01-26 ENCOUNTER — Telehealth (HOSPITAL_COMMUNITY): Payer: Self-pay | Admitting: *Deleted

## 2013-02-02 ENCOUNTER — Telehealth (HOSPITAL_COMMUNITY): Payer: Self-pay | Admitting: *Deleted

## 2013-02-04 ENCOUNTER — Encounter (HOSPITAL_COMMUNITY): Payer: Self-pay | Admitting: *Deleted

## 2013-02-04 ENCOUNTER — Inpatient Hospital Stay (HOSPITAL_COMMUNITY)
Admission: EM | Admit: 2013-02-04 | Discharge: 2013-02-07 | DRG: 247 | Disposition: A | Discharge status: Home / Self Care | Payer: Medicare Other | Attending: Cardiovascular Disease | Admitting: Cardiovascular Disease

## 2013-02-04 ENCOUNTER — Encounter (HOSPITAL_COMMUNITY): Payer: Medicare Other

## 2013-02-04 ENCOUNTER — Encounter (HOSPITAL_COMMUNITY)
Admission: EM | Disposition: A | Discharge status: Home / Self Care | Payer: Self-pay | Source: Home / Self Care | Attending: Cardiovascular Disease

## 2013-02-04 DIAGNOSIS — I2510 Atherosclerotic heart disease of native coronary artery without angina pectoris: Secondary | ICD-10-CM

## 2013-02-04 DIAGNOSIS — Y849 Medical procedure, unspecified as the cause of abnormal reaction of the patient, or of later complication, without mention of misadventure at the time of the procedure: Secondary | ICD-10-CM | POA: Diagnosis present

## 2013-02-04 DIAGNOSIS — I129 Hypertensive chronic kidney disease with stage 1 through stage 4 chronic kidney disease, or unspecified chronic kidney disease: Secondary | ICD-10-CM | POA: Diagnosis present

## 2013-02-04 DIAGNOSIS — I252 Old myocardial infarction: Secondary | ICD-10-CM | POA: Diagnosis not present

## 2013-02-04 DIAGNOSIS — Z79899 Other long term (current) drug therapy: Secondary | ICD-10-CM | POA: Diagnosis not present

## 2013-02-04 DIAGNOSIS — I2582 Chronic total occlusion of coronary artery: Secondary | ICD-10-CM | POA: Diagnosis not present

## 2013-02-04 DIAGNOSIS — Z9861 Coronary angioplasty status: Secondary | ICD-10-CM | POA: Diagnosis not present

## 2013-02-04 DIAGNOSIS — N183 Chronic kidney disease, stage 3 unspecified: Secondary | ICD-10-CM | POA: Diagnosis not present

## 2013-02-04 DIAGNOSIS — E785 Hyperlipidemia, unspecified: Secondary | ICD-10-CM | POA: Diagnosis present

## 2013-02-04 DIAGNOSIS — T82897A Other specified complication of cardiac prosthetic devices, implants and grafts, initial encounter: Secondary | ICD-10-CM | POA: Diagnosis present

## 2013-02-04 DIAGNOSIS — C61 Malignant neoplasm of prostate: Secondary | ICD-10-CM

## 2013-02-04 DIAGNOSIS — Z7982 Long term (current) use of aspirin: Secondary | ICD-10-CM

## 2013-02-04 DIAGNOSIS — Z8546 Personal history of malignant neoplasm of prostate: Secondary | ICD-10-CM | POA: Diagnosis not present

## 2013-02-04 DIAGNOSIS — I25810 Atherosclerosis of coronary artery bypass graft(s) without angina pectoris: Secondary | ICD-10-CM | POA: Diagnosis not present

## 2013-02-04 DIAGNOSIS — G4733 Obstructive sleep apnea (adult) (pediatric): Secondary | ICD-10-CM

## 2013-02-04 DIAGNOSIS — I10 Essential (primary) hypertension: Secondary | ICD-10-CM

## 2013-02-04 DIAGNOSIS — N2889 Other specified disorders of kidney and ureter: Secondary | ICD-10-CM

## 2013-02-04 DIAGNOSIS — I214 Non-ST elevation (NSTEMI) myocardial infarction: Secondary | ICD-10-CM | POA: Diagnosis not present

## 2013-02-04 DIAGNOSIS — Z7902 Long term (current) use of antithrombotics/antiplatelets: Secondary | ICD-10-CM

## 2013-02-04 DIAGNOSIS — R0789 Other chest pain: Secondary | ICD-10-CM | POA: Diagnosis not present

## 2013-02-04 DIAGNOSIS — R079 Chest pain, unspecified: Secondary | ICD-10-CM | POA: Diagnosis not present

## 2013-02-04 DIAGNOSIS — Z955 Presence of coronary angioplasty implant and graft: Secondary | ICD-10-CM

## 2013-02-04 HISTORY — PX: LEFT HEART CATHETERIZATION WITH CORONARY/GRAFT ANGIOGRAM: SHX5450

## 2013-02-04 LAB — BASIC METABOLIC PANEL
CO2: 25 mEq/L (ref 19–32)
Calcium: 9.9 mg/dL (ref 8.4–10.5)
Chloride: 100 mEq/L (ref 96–112)
GFR calc non Af Amer: 49 mL/min — ABNORMAL LOW (ref >90)
Sodium: 135 mEq/L (ref 135–145)

## 2013-02-04 LAB — POCT ACTIVATED CLOTTING TIME: Activated Clotting Time: 124 seconds

## 2013-02-04 LAB — POCT I-STAT TROPONIN I: Troponin i, poc: 0.27 ng/mL (ref 0.00–0.08)

## 2013-02-04 LAB — CBC
MCV: 89.1 fL (ref 78.0–100.0)
Platelets: 196 10*3/uL (ref 150–400)
RBC: 5.42 MIL/uL (ref 4.22–5.81)
WBC: 6.7 10*3/uL (ref 4.0–10.5)

## 2013-02-04 LAB — PROTIME-INR
INR: 1 (ref 0.00–1.49)
Prothrombin Time: 13 seconds (ref 11.6–15.2)

## 2013-02-04 LAB — MRSA PCR SCREENING: MRSA by PCR: NEGATIVE

## 2013-02-04 LAB — TROPONIN I: Troponin I: 0.38 ng/mL (ref <0.30)

## 2013-02-04 LAB — PRO B NATRIURETIC PEPTIDE: Pro B Natriuretic peptide (BNP): 310.8 pg/mL — ABNORMAL HIGH (ref 0–125)

## 2013-02-04 SURGERY — LEFT HEART CATHETERIZATION WITH CORONARY/GRAFT ANGIOGRAM
Anesthesia: LOCAL

## 2013-02-04 MED ORDER — ASPIRIN 81 MG PO TABS: 81.0000 mg | ORAL_TABLET | Freq: Every day | ORAL | Status: DC

## 2013-02-04 MED ORDER — RANOLAZINE ER 500 MG PO TB12
500.0000 mg | ORAL_TABLET | Freq: Two times a day (BID) | ORAL | Status: DC
Start: 2013-02-04 — End: 2013-02-07
  Administered 2013-02-04 – 2013-02-07 (×7): 500 mg via ORAL
  Filled 2013-02-04 (×8): qty 1

## 2013-02-04 MED ORDER — LIDOCAINE HCL (PF) 1 % IJ SOLN
INTRAMUSCULAR | Status: AC
  Filled 2013-02-04: qty 30

## 2013-02-04 MED ORDER — ACETAMINOPHEN 325 MG PO TABS
650.0000 mg | ORAL_TABLET | ORAL | Status: DC | PRN
  Administered 2013-02-04: 650 mg via ORAL
  Filled 2013-02-04: qty 2

## 2013-02-04 MED ORDER — MIDAZOLAM HCL 2 MG/2ML IJ SOLN
INTRAMUSCULAR | Status: AC
  Filled 2013-02-04: qty 2

## 2013-02-04 MED ORDER — HEPARIN (PORCINE) IN NACL 2-0.9 UNIT/ML-% IJ SOLN
INTRAMUSCULAR | Status: AC
  Filled 2013-02-04: qty 1000

## 2013-02-04 MED ORDER — CLOPIDOGREL BISULFATE 75 MG PO TABS
75.0000 mg | ORAL_TABLET | Freq: Every day | ORAL | Status: DC
  Administered 2013-02-04: 75 mg via ORAL
  Filled 2013-02-04 (×3): qty 1

## 2013-02-04 MED ORDER — NITROGLYCERIN 0.4 MG SL SUBL: 0.4000 mg | SUBLINGUAL_TABLET | SUBLINGUAL | Status: DC | PRN

## 2013-02-04 MED ORDER — VITAMIN B-12 100 MCG PO TABS
100.0000 ug | ORAL_TABLET | Freq: Every day | ORAL | Status: DC
  Administered 2013-02-05 – 2013-02-07 (×3): 100 ug via ORAL
  Filled 2013-02-04 (×4): qty 1

## 2013-02-04 MED ORDER — VITAMIN D3 25 MCG (1000 UNIT) PO TABS
1000.0000 [IU] | ORAL_TABLET | Freq: Every day | ORAL | Status: DC
  Administered 2013-02-05 – 2013-02-07 (×3): 1000 [IU] via ORAL
  Filled 2013-02-04 (×4): qty 1

## 2013-02-04 MED ORDER — NITROGLYCERIN 0.2 MG/ML ON CALL CATH LAB
INTRAVENOUS | Status: AC
  Filled 2013-02-04: qty 1

## 2013-02-04 MED ORDER — SODIUM CHLORIDE 0.9 % IV SOLN: INTRAVENOUS | Status: DC

## 2013-02-04 MED ORDER — LOSARTAN POTASSIUM 50 MG PO TABS
50.0000 mg | ORAL_TABLET | Freq: Every day | ORAL | Status: DC
  Administered 2013-02-04: 50 mg via ORAL
  Filled 2013-02-04 (×2): qty 1

## 2013-02-04 MED ORDER — HEPARIN (PORCINE) IN NACL 100-0.45 UNIT/ML-% IJ SOLN
1300.0000 [IU]/h | INTRAMUSCULAR | Status: DC
  Administered 2013-02-04 – 2013-02-05 (×2): 1300 [IU]/h via INTRAVENOUS
  Filled 2013-02-04 (×4): qty 250

## 2013-02-04 MED ORDER — ACETAMINOPHEN 325 MG PO TABS: 650.0000 mg | ORAL_TABLET | ORAL | Status: DC | PRN

## 2013-02-04 MED ORDER — DIAZEPAM 5 MG PO TABS
5.0000 mg | ORAL_TABLET | ORAL | Status: AC
  Administered 2013-02-04: 5 mg via ORAL
  Filled 2013-02-04: qty 1

## 2013-02-04 MED ORDER — SODIUM CHLORIDE 0.9 % IV SOLN
INTRAVENOUS | Status: DC
  Administered 2013-02-04 (×2): via INTRAVENOUS

## 2013-02-04 MED ORDER — ONDANSETRON HCL 4 MG/2ML IJ SOLN: 4.0000 mg | Freq: Four times a day (QID) | INTRAMUSCULAR | Status: DC | PRN

## 2013-02-04 MED ORDER — ONDANSETRON HCL 4 MG/2ML IJ SOLN
4.0000 mg | Freq: Four times a day (QID) | INTRAMUSCULAR | Status: DC | PRN
  Administered 2013-02-06: 4 mg via INTRAVENOUS
  Filled 2013-02-04: qty 2

## 2013-02-04 MED ORDER — ASPIRIN 325 MG PO TABS
325.0000 mg | ORAL_TABLET | ORAL | Status: AC
  Administered 2013-02-04: 325 mg via ORAL
  Filled 2013-02-04: qty 1

## 2013-02-04 MED ORDER — ASPIRIN 81 MG PO CHEW
81.0000 mg | CHEWABLE_TABLET | Freq: Every day | ORAL | Status: DC
  Filled 2013-02-04: qty 1

## 2013-02-04 MED ORDER — FENTANYL CITRATE 0.05 MG/ML IJ SOLN
INTRAMUSCULAR | Status: AC
  Filled 2013-02-04: qty 2

## 2013-02-04 MED ORDER — SODIUM CHLORIDE 0.9 % IV SOLN: 250.0000 mL | INTRAVENOUS | Status: DC | PRN

## 2013-02-04 MED ORDER — HEPARIN BOLUS VIA INFUSION
4000.0000 [IU] | Freq: Once | INTRAVENOUS | Status: AC
  Administered 2013-02-04: 4000 [IU] via INTRAVENOUS
  Filled 2013-02-04 (×2): qty 4000

## 2013-02-04 MED ORDER — VITAMIN C 500 MG PO TABS
1000.0000 mg | ORAL_TABLET | Freq: Every day | ORAL | Status: DC
  Administered 2013-02-05 – 2013-02-07 (×3): 1000 mg via ORAL
  Filled 2013-02-04 (×4): qty 2

## 2013-02-04 MED ORDER — HEPARIN (PORCINE) IN NACL 100-0.45 UNIT/ML-% IJ SOLN
1300.0000 [IU]/h | INTRAMUSCULAR | Status: DC
  Administered 2013-02-04: 1300 [IU]/h via INTRAVENOUS
  Filled 2013-02-04 (×2): qty 250

## 2013-02-04 MED ORDER — NITROGLYCERIN IN D5W 200-5 MCG/ML-% IV SOLN
2.0000 ug/min | INTRAVENOUS | Status: DC
  Administered 2013-02-05: 20 ug/min via INTRAVENOUS
  Filled 2013-02-04: qty 250

## 2013-02-04 MED ORDER — ZOLPIDEM TARTRATE 5 MG PO TABS: 5.0000 mg | ORAL_TABLET | Freq: Every evening | ORAL | Status: DC | PRN

## 2013-02-04 MED ORDER — METOPROLOL SUCCINATE ER 25 MG PO TB24
25.0000 mg | ORAL_TABLET | Freq: Every day | ORAL | Status: DC
  Administered 2013-02-04 – 2013-02-07 (×4): 25 mg via ORAL
  Filled 2013-02-04 (×4): qty 1

## 2013-02-04 MED ORDER — ALPRAZOLAM 0.25 MG PO TABS: 0.2500 mg | ORAL_TABLET | Freq: Three times a day (TID) | ORAL | Status: DC | PRN

## 2013-02-04 MED ORDER — SODIUM CHLORIDE 0.9 % IV SOLN
Freq: Once | INTRAVENOUS | Status: AC
  Administered 2013-02-04: 11:00:00 via INTRAVENOUS

## 2013-02-04 MED ORDER — OMEGA-3-ACID ETHYL ESTERS 1 G PO CAPS
2.0000 g | ORAL_CAPSULE | Freq: Two times a day (BID) | ORAL | Status: DC
  Administered 2013-02-04 – 2013-02-07 (×6): 2 g via ORAL
  Filled 2013-02-04 (×8): qty 2

## 2013-02-04 MED ORDER — SODIUM CHLORIDE 0.9 % IJ SOLN: 3.0000 mL | Freq: Two times a day (BID) | INTRAMUSCULAR | Status: DC

## 2013-02-04 MED ORDER — SODIUM CHLORIDE 0.9 % IJ SOLN: 3.0000 mL | INTRAMUSCULAR | Status: DC | PRN

## 2013-02-04 MED ORDER — PANTOPRAZOLE SODIUM 40 MG PO TBEC
40.0000 mg | DELAYED_RELEASE_TABLET | Freq: Every day | ORAL | Status: DC
  Administered 2013-02-05 – 2013-02-06 (×2): 40 mg via ORAL
  Filled 2013-02-04 (×2): qty 1

## 2013-02-04 MED ORDER — CO Q 10 100 MG PO CAPS: 300.0000 mg | ORAL_CAPSULE | Freq: Every evening | ORAL | Status: DC

## 2013-02-04 MED ORDER — NITROGLYCERIN IN D5W 200-5 MCG/ML-% IV SOLN: 2.0000 ug/min | INTRAVENOUS | Status: DC

## 2013-02-04 MED ORDER — NITROGLYCERIN IN D5W 200-5 MCG/ML-% IV SOLN
2.0000 ug/min | Freq: Once | INTRAVENOUS | Status: AC
  Administered 2013-02-04: 5 ug/min via INTRAVENOUS
  Filled 2013-02-04: qty 250

## 2013-02-04 MED ORDER — ATORVASTATIN CALCIUM 80 MG PO TABS
80.0000 mg | ORAL_TABLET | Freq: Every day | ORAL | Status: DC
  Administered 2013-02-04 – 2013-02-06 (×3): 80 mg via ORAL
  Filled 2013-02-04 (×4): qty 1

## 2013-02-04 MED ORDER — NITROGLYCERIN 0.4 MG SL SUBL
0.4000 mg | SUBLINGUAL_TABLET | SUBLINGUAL | Status: DC | PRN
  Administered 2013-02-04: 0.4 mg via SUBLINGUAL
  Filled 2013-02-04: qty 25

## 2013-02-04 NOTE — ED Notes (Signed)
Pt with hx of cabg and stent is here with CP that began this am and was associated with diaphoresis, nausea, vomiting and lightheadedness and sob.  Pt is alert and oriented.

## 2013-02-04 NOTE — Progress Notes (Signed)
PHARMACIST - PHYSICIAN ORDER COMMUNICATION  CONCERNING: P&T Medication Policy on Herbal Medications  DESCRIPTION:  This patient's order for:  Coenzyme Q10  has been noted.  This product(s) is classified as an "herbal" or natural product. Due to a lack of definitive safety studies or FDA approval, nonstandard manufacturing practices, plus the potential risk of unknown drug-drug interactions while on inpatient medications, the Pharmacy and Therapeutics Committee does not permit the use of "herbal" or natural products of this type within [facility].   ACTION TAKEN: The pharmacy department is unable to verify this order at this time and your patient has been informed of this safety policy. Please reevaluate patient's clinical condition at discharge and address if the herbal or natural product(s) should be resumed at that time.  Christoper Fabian, PharmD, BCPS Clinical pharmacist, pager [phone] 02/04/2013  12:21 PM

## 2013-02-04 NOTE — Progress Notes (Signed)
ANTICOAGULATION CONSULT NOTE - Initial Consult  Pharmacy Consult:  Heparin Indication:  ACS  No Known Allergies  Patient Measurements: Height: 5\' 10"  (177.8 cm) Weight: 207 lb (93.895 kg) IBW/kg (Calculated) : 73 Heparin Dosing Weight: 92 kg  Vital Signs: Temp: 97.6 F (36.4 C) (10/01 0812) Temp src: Oral (10/01 0812) BP: 150/84 mmHg (10/01 0945) Pulse Rate: 59 (10/01 0945)  Labs:  Recent Labs  02/04/13 0817  HGB 16.6  HCT 48.3  PLT 196  CREATININE 1.43*    Estimated Creatinine Clearance: 56.9 ml/min (by C-G formula based on Cr of 1.43).   Medical History: Past Medical History  Diagnosis Date  . Hypertension   . Hyperlipidemia     2012 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 levels LDL IIIa+b% and HDL2b%  . Coronary artery disease 05/10/2011    Lexiscan- EF54% basal to mid inferior scar. no reversible ischemia  hypokinesis  . HTN (hypertension) 10/16/2011    Echo -EF 50-55% ,LV NORMAL  . Sleep apnea, obstructive     using C-PAP  . Myocardial infarction 1994    infer[facility]  . Coronary artery disease 1988    initial PTCA distal RCA       Assessment: Joshua Hahn with PMH significant for HTN, HLD, CAD post stenting, and CABG in 1994 presented to the ED with chest pain.  Pharmacy consulted to manage IV heparin. Baseline labs reviewed.  Not on anticoagulation PTA.   Goal of Therapy:  Heparin level 0.3-0.7 units/ml Monitor platelets by anticoagulation protocol: Yes    Plan:  - Heparin 4000 units IV bolus x 1, then - Heparin gtt at 1300 units/hr - Check 6 hr HL - Daily HL / CBC     Isacc Turney D. Laney Potash, PharmD, BCPS Pager:  [phone] 02/04/2013, 10:05 AM

## 2013-02-04 NOTE — Progress Notes (Signed)
Pt escorted over to cath lab on monitor.

## 2013-02-04 NOTE — Interval H&P Note (Signed)
Cath Lab Visit (complete for each Cath Lab visit)  Clinical Evaluation Leading to the Procedure:   ACS: no  Non-ACS:    Anginal Classification: CCS IV  Anti-ischemic medical therapy: Maximal Therapy (2 or more classes of medications)  Non-Invasive Test Results: No non-invasive testing performed  Prior CABG: Previous CABG      History and Physical Interval Note:  02/04/2013 2:28 PM  Joshua Hahn  has presented today for surgery, with the diagnosis of cp  The various methods of treatment have been discussed with the patient and family. After consideration of risks, benefits and other options for treatment, the patient has consented to  Procedure(s): LEFT HEART CATHETERIZATION WITH CORONARY/GRAFT ANGIOGRAM (N/A) as a surgical intervention .  The patient's history has been reviewed, patient examined, no change in status, stable for surgery.  I have reviewed the patient's chart and labs.  Questions were answered to the patient's satisfaction.     KELLY,THOMAS A

## 2013-02-04 NOTE — ED Provider Notes (Signed)
Pt c/o mid chest pain this am. Dull. Mild sob.  Cp free currently. Trop elev. Minor st/t changes on ecg. Pain briefly recurred. ntg gtt, hep. Has had asa.  Card to see in ed.  Recheck pain free, chest cta.    Date: 02/04/2013  Rate: 68  Rhythm: normal sinus rhythm  QRS Axis: normal  Intervals: normal  ST/T Wave abnormalities: nonspecific ST/T changes  Conduction Disutrbances:none  Narrative Interpretation:   Old EKG Reviewed: changes noted  I saw and evaluated the patient, reviewed the resident's note and I agree with the findings and plan.   Suzi Roots, MD 02/04/13 1000

## 2013-02-04 NOTE — CV Procedure (Signed)
Joshua Hahn is a 67 y.o. 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 In 1994 he suffered an inferior MI. Due to progressive CAD noted at cath he underwent CABG surgery with a free LIMA supplying the high diagonal vessel, SVG to the mid LAD, sequential vein graft to the OM1 and OM 2, and vein graft to the right coronary artery. In 2010 he underwent complex high-speed rotational atherectomy of his diffusely diseased native circumflex with ultimate insertion of tandem stents extending from the ostium to the mid circumflex flex vessel.  He had an occluded graft to circumflex as well as an occluded graft to his RCA artery with native RCA occlusion. He has done well for the past 4 years but recently has developed recurrent episodes of chest pain. This morning he was awakened from sleep at 6 AM with severe chest pain associated with diaphoresis. Initial POC troponin markers were mildly positive at 0.27. His pain abated with nitroglycerin. He was started on heparin. He now presents for definitive cardiac catheterization.    PROCEDURE:  The patient was brought to the second floor [facility] cath lab in the postabsorptive state. He was premedicated with Versed 2 mg and fentanyl 50 mcg intravenously.  His right was prepped and shaved in usual sterile fashion. Xylocaine 1% was used for local anesthesia. A 5 French sheath was inserted into the right femoral artery. Diagnostic catheterizatiion was done with 5French LF4, FR4, and pigtail catheters. Left ventriculography was done with 25 cc Omnipaque contrast. Hemostasis was obtained by direct manual compression. The patient tolerated the procedure  well.   HEMODYNAMICS:   Central Aorta: 143/82  Left Ventricle: 143/17  ANGIOGRAPHY:  1. Left main: normal  2. LAD: occluded at its origin 3. Left circumflex: Extensive stenting was present in the left main into the mid circumflex. 20-30% narrowing at the most proximal portion of the stent. There was a very focal 95% stenosis in the distal third of the stent. The circumflex marginal had mild 40% narrowing beyond the stented segment in the vessel supplied the apex. 4. Right coronary artery: Totally occluded at its origin with faint antegrade collaterals 5. Free LIMA to high diagonal: Mild hypodensity  in the proximal third in the bend in the vessel 6. SVG TO LCX: Occluded (old) 7.  SVG TO  RCA: Occluded (old)  8. Left ventriculography revealed mild LV dysfunction with an ejection fraction of approximately 50%. There was mild anterolateral hypocontractility.  IMPRESSION:  Mild LV dysfunction with ejection fraction of 50% with mild anterolateral hypocontractility.  Significant native coronary disease with total occlusion of the LAD at its origin from the left main, diffusely stented proximal to mid left circumflex coronary artery with smooth 20-30% narrowing within the stented segment and focal 95% distal third stenosis in the stent with mild 40% distal circumflex narrowing beyond the stented segment.  Old native proximal RCA occlusion with extensive left to right collaterals arising from the circumflex vessel  proximal to the focal 95% stenosis.  Old vein graft occlusion to the right coronary artery.  Old vein graft occlusion which previously had been a sequential graft to the OM1/ 2 and had formerly arisen from  the body of the free LIMA graft proximally  Saphenous vein graft supplying the mid LAD with 80-90% near ostial-proximal stenosis.  RECOMMENDATION:  The patient does have mild renal insufficiency with a creatinine of 1.46. He will be hydrated following today's procedure.  Angiograms we reviewed with colleagues.  Treatment options include 2 vessel PCI to the focal in-stent 95% native circumflex stenosis and proximal  vein graft supplying the mid LAD versus consideration for repeat CABG surgery.   Lennette Bihari, MD, [facility] 02/04/2013 3:59 PM

## 2013-02-04 NOTE — ED Notes (Signed)
Cardiology PA at the bedside

## 2013-02-04 NOTE — H&P (Signed)
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 He had a PCI in the 1980s, CABG X 4 in 1994 (free LIMA-Dx, SVG-LAD, SVG-OM1OM2, SVG-RCA). He had a DES placed to his CFX in April 2010. Myoview JAN 2013 low risk. He just saw Dr Tresa Endo 01/11/13 and was having some chest pain. Dr Tresa Endo suggested increasing his Imdur to 90 mg and increasing his Ranexa to 1000 mg BID. He was to have a Myoview today. The pt developed SSCP this am (6 am) that woke him up. This was associated with Lt jaw pain, nausea and vomiting, and diaphoresis. In the ER his symptoms were relieved with NTG X 1. His POC marker is elevated at 0.27. His EKG shows no acute changes. The pt admits he did not make the medication changes suggested by Dr Tresa Endo.    Problem List: Past Medical History  Diagnosis Date  . Hypertension   . 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 levels LDL IIIa+b% and HDL2b%  . Coronary artery disease 1994    PCI'80s, CABG '94, CFX DES 4/10  . HTN (hypertension) 10/16/2011    Echo -EF 50-55% ,LV NORMAL  . Sleep apnea, obstructive     using C-PAP  . Myocardial infarction 1994    inferior wall MI at [facility]    Past Surgical History  Procedure Laterality Date  . Coronary artery bypass graft  1994  . Cardiac catheterization  06/23/2008    occlusion sequential OM1 andOM2 graft as well vein graft to RCA.LAD and diagonal system remain intact. RCA  collateralized  . Percutaneous coronary stent intervention (pci-s)  06/25/2008    rotational atherectomy w/diffuse disease native circ. w/drug eluting stents , PTCA on one vessel; grat occlusion to RCA w/native RCA proxim. occlusion. Circ.supplied collaaterals distal RCA  following intervention   . Left parotid gland surgry  05/2009    Dr. Lazarus Salines  . Prostatectomy  06/2009    robotic-asssisted laparoscopic radical by Dr Earlene Plater  . Exploratory laparotomy      ideopathic retroperitoneal fibrosis     Allergies: No Known Allergies   Home Medications Current Facility-Administered Medications  Medication Dose Route Frequency Provider Last Rate Last Dose  . ALPRAZolam Prudy Feeler) tablet 0.25 mg  0.25 mg Oral TID PRN Abelino Derrick, PA-C      . heparin ADULT infusion 100 units/mL (25000 units/250 mL)  1,300 Units/hr Intravenous Continuous Lennon Alstrom, RPH 13 mL/hr at 02/04/13 1024 1,300 Units/hr at 02/04/13 1024  . nitroGLYCERIN (NITROSTAT) SL tablet 0.4 mg  0.4 mg Sublingual Q5 min PRN Suzi Roots, MD   0.4 mg at 02/04/13 0851  . zolpidem (AMBIEN) tablet 5 mg  5 mg Oral QHS PRN Abelino Derrick, PA-C       Current Outpatient Prescriptions  Medication Sig Dispense Refill  . aspirin 81 MG tablet Take 81 mg by mouth daily.      . cholecalciferol (VITAMIN D) 1000 UNITS tablet Take 1,000 Units by mouth daily.      . clopidogrel (PLAVIX) 75 MG tablet Take 75 mg by mouth daily.      . Coenzyme Q10 (CO Q 10) 100 MG CAPS Take 300 mg by mouth every evening.       . isosorbide  mononitrate (IMDUR) 60 MG 24 hr tablet Take 60 mg by mouth daily.      Marland Kitchen losartan (COZAAR) 50 MG tablet Take 1 tablet (50 mg total) by mouth daily.  60 tablet  11  . metoprolol succinate (TOPROL-XL) 50 MG 24 hr tablet Take 25 mg by mouth daily. Take with or immediately following a meal.      . nitroGLYCERIN (NITROSTAT) 0.4 MG SL tablet Place 0.4 mg under the tongue every 5 (five) minutes as needed for chest pain.      Marland Kitchen omega-3 acid ethyl esters (LOVAZA) 1 G capsule Take 2 g by mouth 2 (two) times daily.      . ranolazine (RANEXA) 500 MG 12 hr tablet Take 500 mg by mouth 2 (two) times daily.      . rosuvastatin (CRESTOR) 40 MG tablet Take 40 mg by mouth daily.      . valsartan (DIOVAN) 80 MG tablet Take 80 mg by mouth  daily.      . vitamin B-12 (CYANOCOBALAMIN) 100 MCG tablet Take 100 mcg by mouth daily.       . vitamin C (ASCORBIC ACID) 500 MG tablet Take 1,000 mg by mouth daily.         FMHX-2 brothers with CAD in their 17s  History   Social History  . Marital Status: Married    Spouse Name: N/A    Number of Children: 2  . Years of Education: N/A   Occupational History  . pastor    Social History Main Topics  . Smoking status: Never Smoker   . Smokeless tobacco: Never Used  . Alcohol Use: No  . Drug Use: Not on file  . Sexual Activity: Not on file   Other Topics Concern  . Not on file   Social History Narrative  . No narrative on file     Review of Systems: General: negative for chills, fever, night sweats or weight changes.  Cardiovascular: negative for chest pain, dyspnea on exertion, edema, orthopnea, palpitations, paroxysmal nocturnal dyspnea or shortness of breath Dermatological: negative for rash Respiratory: negative for cough or wheezing Urologic: negative for hematuria Abdominal: negative for nausea, vomiting, diarrhea, bright red blood per rectum, melena, or hematemesis Neurologic: negative for visual changes, syncope, or dizziness All other systems reviewed and are otherwise negative except as noted above.  Physical Exam: Blood pressure 150/84, pulse 59, temperature 97.6 F (36.4 C), temperature source Oral, resp. rate 15, height 5\' 10"  (1.778 m), weight 207 lb (93.895 kg), SpO2 96.00%.  General appearance: alert, cooperative and no distress Neck: no carotid bruit and no JVD Lungs: clear to auscultation bilaterally Heart: regular rate and rhythm Abdomen: midline surgical scar, non tender Extremities: extremities normal, atraumatic, no cyanosis or edema Pulses: 2+ and symmetric Skin: Skin color, texture, turgor normal. No rashes or lesions Neurologic: Grossly normal    Labs:   Results for orders placed during the hospital encounter of 02/04/13 (from the past  24 hour(s))  PRO B NATRIURETIC PEPTIDE     Status: Abnormal   Collection Time    02/04/13  8:16 AM      Result Value Range   Pro B Natriuretic peptide (BNP) 310.8 (*) 0 - 125 pg/mL  BASIC METABOLIC PANEL     Status: Abnormal   Collection Time    02/04/13  8:17 AM      Result Value Range   Sodium 135  135 - 145 mEq/L   Potassium 4.5  3.5 - 5.1  mEq/L   Chloride 100  96 - 112 mEq/L   CO2 25  19 - 32 mEq/L   Glucose, Bld 122 (*) 70 - 99 mg/dL   BUN 9  6 - 23 mg/dL   Creatinine, Ser 2.13 (*) 0.50 - 1.35 mg/dL   Calcium 9.9  8.4 - 08.6 mg/dL   GFR calc non Af Amer 49 (*) >90 mL/min   GFR calc Af Amer 57 (*) >90 mL/min  CBC     Status: None   Collection Time    02/04/13  8:17 AM      Result Value Range   WBC 6.7  4.0 - 10.5 K/uL   RBC 5.42  4.22 - 5.81 MIL/uL   Hemoglobin 16.6  13.0 - 17.0 g/dL   HCT 57.8  46.9 - 62.9 %   MCV 89.1  78.0 - 100.0 fL   MCH 30.6  26.0 - 34.0 pg   MCHC 34.4  30.0 - 36.0 g/dL   RDW 52.8  41.3 - 24.4 %   Platelets 196  150 - 400 K/uL  POCT I-STAT TROPONIN I     Status: Abnormal   Collection Time    02/04/13  8:45 AM      Result Value Range   Troponin i, poc 0.27 (*) 0.00 - 0.08 ng/mL   Comment NOTIFIED PHYSICIAN     Comment 3              Radiology/Studies: No results found.  EKG:NSR NSST changes  ASSESSMENT AND PLAN:  Principal Problem:   NSTEMI (non-ST elevated myocardial infarction) Active Problems:   CAD - PCI 1980s, CABG X 4 1994, DES CFX April 2010   ADENOCARCINOMA, PROSTATE, GLEASON GRADE 7   HYPERLIPIDEMIA   HYPERTENSION   OSA on CPAP   PLAN: Heparin and IV NTG ordered. Possible cath today, Dr Tresa Endo to see.   Deland Pretty, PA-C 02/04/2013, 10:44 AM    Patient seen and examined. Agree with assessment and plan.  Pt is well known to me dating back to 1980'2 when I performed PTCA to his distal RCA. He sufferered and IMI while in [place] in 1994 and cath by me  Revealed progressive CAD for which he underwent CABG  (free LIMA in high Dx1, SVG to LAD, SVG to OM1-2, and SVG to RCA).  He underwent complex HSRA to his native LCX in 2010 and had occluded prox limb of graft to OM1 and occluded SVG to RCA with native RCA occlusion. LIMA to Dx and SVG to LAD were patent. He has h/o OSA with intermittent CPAP use. When I saw him recently he had been off his ARB and had noted some chest pain. I recommended medicine titration and f/u nuclear scan. This am he was awakened from sleep with [place] (was not using his CPAP). POC trop is mildly positive. Plan cath today; pain free now on heparin and IV NTG, but titrate with BP still elevated at 160 systolic. Hydrate with mild renal insufficiency.   Lennette Bihari, MD, [facility] 02/04/2013 11:14 AM

## 2013-02-04 NOTE — ED Notes (Signed)
Pt reports chest pain is increasing. Dr. Denton Lank aware.

## 2013-02-04 NOTE — ED Notes (Signed)
Troponin results called to primary nurse Lowella Bandy

## 2013-02-04 NOTE — ED Notes (Signed)
Dr. Steinl at bedside 

## 2013-02-04 NOTE — Care Management Note (Addendum)
    Page 1 of 1   02/05/2013     3:48:03 PM   CARE MANAGEMENT NOTE 02/05/2013  Patient:  Joshua Hahn, Joshua Hahn   Account Number:  [id number]  Date Initiated:  02/04/2013  Documentation initiated by:  Junius Creamer  Subjective/Objective Assessment:   adm w mi     Action/Plan:   lives w wife, pcp dr Darryll Capers   Anticipated DC Date:     Anticipated DC Plan:        DC Planning Services  CM consult  Medication Assistance      Choice offered to / List presented to:             Status of service:   Medicare Important Message given?   (If response is "NO", the following Medicare IM given date fields will be blank) Date Medicare IM given:   Date Additional Medicare IM given:    Discharge Disposition:    Per UR Regulation:  Reviewed for med. necessity/level of care/duration of stay  If discussed at Long Length of Stay Meetings, dates discussed:    Comments:  10/2 1546 debbie Evelena Masci rn,bsn pt was changed to brilinta today. gave pt 30day free brilinta card. pt has medicare part d that helps w meds.

## 2013-02-04 NOTE — Progress Notes (Signed)
ANTICOAGULATION CONSULT NOTE -Follow up  Pharmacy Consult:  Heparin Indication:  ACS  No Known Allergies  Patient Measurements: Height: 5\' 10"  (177.8 cm) Weight: 210 lb 1.6 oz (95.3 kg) IBW/kg (Calculated) : 73 Heparin Dosing Weight: 92 kg  Vital Signs: Temp: 97.7 F (36.5 C) (10/01 1207) Temp src: Oral (10/01 1207) BP: 169/98 mmHg (10/01 1330) Pulse Rate: 61 (10/01 1448)  Labs:  Recent Labs  02/04/13 0817 02/04/13 1344 02/04/13 1345  HGB 16.6  --   --   HCT 48.3  --   --   PLT 196  --   --   LABPROT  --  13.0  --   INR  --  1.00  --   CREATININE 1.43*  --   --   TROPONINI  --   --  0.38*    Estimated Creatinine Clearance: 57.3 ml/min (by C-G formula based on Cr of 1.43).   Medical History: Past Medical History  Diagnosis Date  . Hypertension   . 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 levels LDL IIIa+b% and HDL2b%  . Coronary artery disease 1994    PCI'80s, CABG '94, CFX DES 4/10  . HTN (hypertension) 10/16/2011    Echo -EF 50-55% ,LV NORMAL  . Sleep apnea, obstructive     using C-PAP  . Myocardial infarction 1994    inferior wall MI at [facility]       Assessment: Heparin drip to resumed post cath today 6 hours after sheath removal in this 67 y.o male for significant coronary vascular disease.  Treatment options per Dr. Tresa Endo are 2 vessel PCI vs. consideration for repeat CABG.  No bleeding or hematoma reported. Heparin to resume at 22:11 tonight at previous rate of 1300 units/hr. No bolus. PLTC 196, INR 1.0  Not on anticogulation PTA.   Goal of Therapy:  Heparin level 0.3-0.7 units/ml Monitor platelets by anticoagulation protocol: Yes    Plan:  - Restart Heparin at 1300 units/hr at 22:10 tonight - Check ~6 hr HL with AM labs.  - Daily HL / CBC  Noah Delaine, RPh Clinical Pharmacist Pager: [phone] 02/04/2013, 5:09 PM

## 2013-02-04 NOTE — ED Provider Notes (Signed)
CSN: [id number]     Arrival date & time 02/04/13  0808 History   First MD Initiated Contact with Patient 02/04/13 [phone]     Chief Complaint  Patient presents with  . Chest Pain   (Consider location/radiation/quality/duration/timing/severity/associated sxs/prior Treatment) Patient is a 67 y.o. male presenting with chest pain.  Chest Pain Pain location:  L chest and L lateral chest Pain quality: pressure   Pain radiates to:  L jaw and L shoulder Pain severity:  Moderate Onset quality:  Sudden Duration:  3 hours Timing:  Constant Progression:  Worsening Chronicity:  New Context: at rest   Relieved by:  None tried Worsened by:  Nothing tried Associated symptoms: diaphoresis, fatigue, nausea and shortness of breath   Associated symptoms: no cough, no dizziness, no headache and no numbness   Risk factors: coronary artery disease, high cholesterol and hypertension   Risk factors: no smoking    Patient is a 67 yo male with an extensive cardiac history (stent place in 1980 and CABG in 1994), HTN, HLD who presents with chest pain starting at 6 am this morning. Noted pressure in the left center of his chest radiating to his left jaw and shoulder. Noted nausea, diaphoresis, and shortness of breath. Patient was sleeping when this occurred. The pain was 10/10 and at first evaluation was 7/10. Patient was seen by his cardiologist, Dr. Tresa Endo, on 9/7 and they planned for a stress test today. He has not taken his imdur today or yesterday in preparation for his stress test. He does not smoke, drink alcohol, or do illicits drugs.   Past Medical History  Diagnosis Date  . Hypertension   . 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 levels LDL IIIa+b% and HDL2b%  . Coronary artery disease 05/10/2011    Lexiscan- EF54% basal to mid inferior scar. no reversible ischemia  hypokinesis  . HTN (hypertension) 10/16/2011    Echo -EF 50-55% ,LV NORMAL  . Sleep  apnea, obstructive     using C-PAP  . Myocardial infarction 1994    infer[facility] wall MI at [facility]  . Coronary artery disease 1988    initial PTCA distal RCA   Past Surgical History  Procedure Laterality Date  . Coronary artery bypass graft  1994  . Cardiac catheterization  06/23/2008    occlusion sequential OM1 andOM2 graft as well vein graft to RCA.LAD and diagonal system remain intact. RCA  collateralized  . Percutaneous coronary stent intervention (pci-s)  06/25/2008    rotational atherectomy w/diffuse disease native circ. w/drug eluting stents , PTCA on one vessel; grat occlusion to RCA w/native RCA proxim. occlusion. Circ.supplied collaaterals distal RCA  following intervention  . Left parotid gland surgry  05/2009    Dr. Lazarus Salines  . Prostatectomy  06/2009    robotic-asssisted laparoscopic radical by Dr Earlene Plater   No family history on file. History  Substance Use Topics  . Smoking status: Never Smoker   . Smokeless tobacco: Never Used  . Alcohol Use: No    Review of Systems  Constitutional: Positive for diaphoresis and fatigue.  Respiratory: Positive for shortness of breath. Negative for cough.   Cardiovascular: Positive for chest pain.  Gastrointestinal: Positive for nausea.  Neurological: Negative for dizziness, numbness and headaches.    Allergies  Review of patient's allergies indicates no known allergies.  Home Medications   Current Outpatient Rx  Name  Route  Sig  Dispense  Refill  .  albuterol (PROVENTIL HFA;VENTOLIN HFA) 108 (90 BASE) MCG/ACT inhaler   Inhalation   Inhale 2 puffs into the lungs every 4 (four) hours as needed for wheezing.   1 Inhaler   0   . ascorbic acid (VITAMIN C) 1000 MG tablet   Oral   Take 800 mg by mouth daily.         Marland Kitchen aspirin 81 MG tablet   Oral   Take 81 mg by mouth daily.         . cholecalciferol (VITAMIN D) 1000 UNITS tablet   Oral   Take 1,000 Units by mouth daily.         .  clopidogrel (PLAVIX) 75 MG tablet   Oral   Take 75 mg by mouth daily.         . Coenzyme Q10 (CO Q 10) 100 MG CAPS   Oral   Take 100 mg by mouth. 3 tabs         . finasteride (PROSCAR) 5 MG tablet   Oral   Take 5 mg by mouth daily.         . isosorbide mononitrate (IMDUR) 60 MG 24 hr tablet      Take 1 & 1/2 tablet daily   45 tablet   11   . losartan (COZAAR) 50 MG tablet   Oral   Take 1 tablet (50 mg total) by mouth daily.   60 tablet   11   . metoprolol succinate (TOPROL-XL) 50 MG 24 hr tablet   Oral   Take 25 mg by mouth daily. Take with or immediately following a meal.         . niacin (NIASPAN) 500 MG CR tablet   Oral   Take 500 mg by mouth at bedtime.         . nitroGLYCERIN (NITROSTAT) 0.4 MG SL tablet   Sublingual   Place 0.4 mg under the tongue every 5 (five) minutes as needed for chest pain.         Marland Kitchen omega-3 acid ethyl esters (LOVAZA) 1 G capsule   Oral   Take 2 g by mouth 2 (two) times daily.         . ranolazine (RANEXA) 1000 MG SR tablet   Oral   Take 1 tablet (1,000 mg total) by mouth 2 (two) times daily.   60 tablet   11   . rosuvastatin (CRESTOR) 40 MG tablet   Oral   Take 40 mg by mouth daily.         . vitamin B-12 (CYANOCOBALAMIN) 100 MCG tablet   Oral   Take 50 mcg by mouth daily.          BP 161/91  Pulse 61  Temp(Src) 97.6 F (36.4 C) (Oral)  Resp 22  Ht 5\' 10"  (1.778 m)  Wt 207 lb (93.895 kg)  BMI 29.7 kg/m2  SpO2 97% Physical Exam  Constitutional: He appears well-developed and well-nourished. No distress.  HENT:  Head: Normocephalic and atraumatic.  Mouth/Throat: Oropharynx is clear and moist.  Eyes: Conjunctivae are normal. Pupils are equal, round, and reactive to light.  Neck: Neck supple.  Cardiovascular: Normal rate, regular rhythm and normal heart sounds.   Pulmonary/Chest: Effort normal and breath sounds normal.  Abdominal: Soft. He exhibits no distension. There is no tenderness.   Musculoskeletal: He exhibits no edema.  Neurological: He is alert.  Skin: Skin is warm and dry. He is not diaphoretic.    ED Course  ED EKG (<48mins upon arrival to the ED)  Date/Time: 02/04/2013 11:44 AM Performed by: Birdie Sons, Tymeka Privette G Authorized by: Cathren Laine E Rhythm: sinus rhythm and A-V block Rate: normal Conduction: 1st degree T flattening: I, aVR, aVL, aVF, V3 and V4 Clinical impression: non-specific ECG   (including critical care time) Labs Review Labs Reviewed  PRO B NATRIURETIC PEPTIDE - Abnormal; Notable for the following:    Pro B Natriuretic peptide (BNP) 310.8 (*)    All other components within normal limits  BASIC METABOLIC PANEL - Abnormal; Notable for the following:    Glucose, Bld 122 (*)    Creatinine, Ser 1.43 (*)    GFR calc non Af Amer 49 (*)    GFR calc Af Amer 57 (*)    All other components within normal limits  POCT I-STAT TROPONIN I - Abnormal; Notable for the following:    Troponin i, poc 0.27 (*)    All other components within normal limits  CBC  HEPARIN LEVEL (UNFRACTIONATED)   Imaging Review No results found.  MDM  No diagnosis found.  8:54 am: patient seen and examined. Symptoms consistent with cardiac cause of pain especially given lengthy history of cardiac disease. EKG with non-specific ST and T wave changes with flattening in I, aVR, V3, and V4. Will given prn nitroglycerin, ASA 325 mg, obtain troponin, BMET, and CBC, and will place on O2. Spoke with cardiology and they will evaluate the patient in the ED.  9:01 am: troponin 0.27. Pain resolved with nitroglycerin.  9:57 am: nursing states now having chest pain again. Will start nitro drip and heparin drip.  10:25 am: cardiology has evaluated the patient and plan to admit. Nursing getting nitro and heparin set up.  Marikay Alar, MD 02/04/13 11:44  Glori Luis, MD 02/04/13 1147

## 2013-02-05 ENCOUNTER — Encounter (HOSPITAL_COMMUNITY)
Admission: EM | Disposition: A | Discharge status: Home / Self Care | Payer: Self-pay | Source: Home / Self Care | Attending: Cardiovascular Disease

## 2013-02-05 DIAGNOSIS — I2582 Chronic total occlusion of coronary artery: Secondary | ICD-10-CM | POA: Diagnosis not present

## 2013-02-05 DIAGNOSIS — I25810 Atherosclerosis of coronary artery bypass graft(s) without angina pectoris: Secondary | ICD-10-CM

## 2013-02-05 DIAGNOSIS — I2510 Atherosclerotic heart disease of native coronary artery without angina pectoris: Secondary | ICD-10-CM | POA: Diagnosis not present

## 2013-02-05 DIAGNOSIS — I214 Non-ST elevation (NSTEMI) myocardial infarction: Secondary | ICD-10-CM | POA: Diagnosis not present

## 2013-02-05 DIAGNOSIS — T82897A Other specified complication of cardiac prosthetic devices, implants and grafts, initial encounter: Secondary | ICD-10-CM | POA: Diagnosis not present

## 2013-02-05 DIAGNOSIS — N183 Chronic kidney disease, stage 3 unspecified: Secondary | ICD-10-CM | POA: Diagnosis present

## 2013-02-05 HISTORY — PX: PERCUTANEOUS CORONARY STENT INTERVENTION (PCI-S): SHX5485

## 2013-02-05 LAB — BASIC METABOLIC PANEL
BUN: 8 mg/dL (ref 6–23)
CO2: 22 mEq/L (ref 19–32)
Chloride: 103 mEq/L (ref 96–112)
GFR calc Af Amer: 58 mL/min — ABNORMAL LOW (ref >90)
GFR calc non Af Amer: 50 mL/min — ABNORMAL LOW (ref >90)
Potassium: 4.5 mEq/L (ref 3.5–5.1)
Sodium: 135 mEq/L (ref 135–145)

## 2013-02-05 LAB — CBC
Hemoglobin: 14.8 g/dL (ref 13.0–17.0)
MCV: 89.2 fL (ref 78.0–100.0)
Platelets: 181 10*3/uL (ref 150–400)
RBC: 4.89 MIL/uL (ref 4.22–5.81)
RDW: 13 % (ref 11.5–15.5)
WBC: 6.4 10*3/uL (ref 4.0–10.5)

## 2013-02-05 LAB — HEPARIN LEVEL (UNFRACTIONATED): Heparin Unfractionated: 0.43 IU/mL (ref 0.30–0.70)

## 2013-02-05 LAB — TROPONIN I: Troponin I: 0.62 ng/mL (ref <0.30)

## 2013-02-05 SURGERY — PERCUTANEOUS CORONARY STENT INTERVENTION (PCI-S)
Anesthesia: LOCAL

## 2013-02-05 MED ORDER — ACETAMINOPHEN 325 MG PO TABS: 650.0000 mg | ORAL_TABLET | ORAL | Status: DC | PRN

## 2013-02-05 MED ORDER — MIDAZOLAM HCL 2 MG/2ML IJ SOLN
INTRAMUSCULAR | Status: AC
  Filled 2013-02-05: qty 2

## 2013-02-05 MED ORDER — HEPARIN (PORCINE) IN NACL 2-0.9 UNIT/ML-% IJ SOLN
INTRAMUSCULAR | Status: AC
  Filled 2013-02-05: qty 1000

## 2013-02-05 MED ORDER — SODIUM CHLORIDE 0.9 % IV SOLN
1.0000 mL/kg/h | INTRAVENOUS | Status: DC
  Administered 2013-02-05: 1 mL/kg/h via INTRAVENOUS

## 2013-02-05 MED ORDER — HYDROMORPHONE HCL PF 1 MG/ML IJ SOLN: 1.0000 mg | INTRAMUSCULAR | Status: DC | PRN

## 2013-02-05 MED ORDER — NITROGLYCERIN IN D5W 200-5 MCG/ML-% IV SOLN: 2.0000 ug/min | INTRAVENOUS | Status: DC

## 2013-02-05 MED ORDER — SODIUM CHLORIDE 0.9 % IV SOLN: 250.0000 mL | INTRAVENOUS | Status: DC | PRN

## 2013-02-05 MED ORDER — SODIUM CHLORIDE 0.9 % IJ SOLN: 3.0000 mL | INTRAMUSCULAR | Status: DC | PRN

## 2013-02-05 MED ORDER — SODIUM CHLORIDE 0.9 % IJ SOLN: 3.0000 mL | Freq: Two times a day (BID) | INTRAMUSCULAR | Status: DC

## 2013-02-05 MED ORDER — ASPIRIN EC 81 MG PO TBEC
81.0000 mg | DELAYED_RELEASE_TABLET | Freq: Every day | ORAL | Status: DC
  Administered 2013-02-06 – 2013-02-07 (×2): 81 mg via ORAL
  Filled 2013-02-05 (×2): qty 1

## 2013-02-05 MED ORDER — SODIUM CHLORIDE 0.9 % IV SOLN
1.7500 mg/kg/h | INTRAVENOUS | Status: AC
  Administered 2013-02-05: 1.75 mg/kg/h via INTRAVENOUS
  Filled 2013-02-05: qty 250

## 2013-02-05 MED ORDER — FENTANYL CITRATE 0.05 MG/ML IJ SOLN
INTRAMUSCULAR | Status: AC
  Filled 2013-02-05: qty 2

## 2013-02-05 MED ORDER — LIDOCAINE HCL (PF) 1 % IJ SOLN
INTRAMUSCULAR | Status: AC
  Filled 2013-02-05: qty 30

## 2013-02-05 MED ORDER — TICAGRELOR 90 MG PO TABS
180.0000 mg | ORAL_TABLET | Freq: Once | ORAL | Status: AC
  Administered 2013-02-05: 180 mg via ORAL
  Filled 2013-02-05: qty 2

## 2013-02-05 MED ORDER — TICAGRELOR 90 MG PO TABS
90.0000 mg | ORAL_TABLET | Freq: Two times a day (BID) | ORAL | Status: DC
  Administered 2013-02-05 – 2013-02-07 (×4): 90 mg via ORAL
  Filled 2013-02-05 (×6): qty 1

## 2013-02-05 MED ORDER — TRAMADOL HCL 50 MG PO TABS
50.0000 mg | ORAL_TABLET | Freq: Four times a day (QID) | ORAL | Status: DC | PRN
  Administered 2013-02-05 – 2013-02-06 (×2): 50 mg via ORAL
  Filled 2013-02-05 (×2): qty 1

## 2013-02-05 MED ORDER — ONDANSETRON HCL 4 MG/2ML IJ SOLN: 4.0000 mg | Freq: Four times a day (QID) | INTRAMUSCULAR | Status: DC | PRN

## 2013-02-05 MED ORDER — MORPHINE SULFATE 2 MG/ML IJ SOLN
2.0000 mg | Freq: Once | INTRAMUSCULAR | Status: AC
  Administered 2013-02-05: 2 mg via INTRAVENOUS

## 2013-02-05 MED ORDER — ASPIRIN 81 MG PO CHEW
81.0000 mg | CHEWABLE_TABLET | ORAL | Status: AC
  Administered 2013-02-05: 81 mg via ORAL

## 2013-02-05 MED ORDER — MORPHINE SULFATE 2 MG/ML IJ SOLN
INTRAMUSCULAR | Status: AC
  Filled 2013-02-05: qty 1

## 2013-02-05 MED ORDER — SODIUM CHLORIDE 0.9 % IV SOLN
INTRAVENOUS | Status: DC
  Administered 2013-02-05 – 2013-02-06 (×2): via INTRAVENOUS

## 2013-02-05 MED ORDER — NITROGLYCERIN 0.2 MG/ML ON CALL CATH LAB
INTRAVENOUS | Status: AC
  Filled 2013-02-05: qty 1

## 2013-02-05 MED ORDER — BIVALIRUDIN 250 MG IV SOLR
INTRAVENOUS | Status: AC
  Filled 2013-02-05: qty 250

## 2013-02-05 NOTE — Interval H&P Note (Signed)
Cath Lab Visit (complete for each Cath Lab visit)  Clinical Evaluation Leading to the Procedure:   ACS: yes  Non-ACS:    Anginal Classification: CCS IV  Anti-ischemic medical therapy: Maximal Therapy (2 or more classes of medications)  Non-Invasive Test Results: No non-invasive testing performed  Prior CABG: Previous CABG      History and Physical Interval Note:  02/05/2013 12:28 PM  [name]  has presented today for surgery, with the diagnosis of blockage  The various methods of treatment have been discussed with the patient and family. After consideration of risks, benefits and other options for treatment, the patient has consented to  Procedure(s): PERCUTANEOUS CORONARY STENT INTERVENTION (PCI-S) (N/A) as a surgical intervention .  The patient's history has been reviewed, patient examined, no change in status, stable for surgery.  I have reviewed the patient's chart and labs.  Questions were answered to the patient's satisfaction.     KELLY,THOMAS A

## 2013-02-05 NOTE — H&P (View-Only) (Signed)
Subjective:  No chest pain.  Objective:  Vital Signs in the last 24 hours: Temp:  [97.4 F (36.3 C)-97.8 F (36.6 C)] 97.6 F (36.4 C) (10/02 0749) Pulse Rate:  [41-68] 54 (10/02 0750) Resp:  [13-23] 17 (10/02 0750) BP: (99-169)/(61-98) 129/79 mmHg (10/02 0750) SpO2:  [93 %-99 %] 97 % (10/02 0750) Weight:  [207 lb (93.895 kg)-210 lb 1.6 oz (95.3 kg)] 210 lb 1.6 oz (95.3 kg) (10/01 1207)  Intake/Output from previous day:  Intake/Output Summary (Last 24 hours) at 02/05/13 0802 Last data filed at 02/05/13 0700  Gross per 24 hour  Intake 2649.42 ml  Output    400 ml  Net 2249.42 ml    Physical Exam: General appearance: alert, cooperative and no distress No JVD Lungs: clear to auscultation bilaterally Heart: regular rate and rhythm 1/6 sem Cath site stable Extremities: no hematoma rt groin no edema   Rate: 56  Rhythm: normal sinus rhythm and sinus bradycardia  Lab Results:  Recent Labs  02/04/13 0817  WBC 6.7  HGB 16.6  PLT 196    Recent Labs  02/04/13 0817  NA 135  K 4.5  CL 100  CO2 25  GLUCOSE 122*  BUN 9  CREATININE 1.43*    Recent Labs  02/04/13 1750 02/05/13 0100  TROPONINI 0.64* 0.62*    Recent Labs  02/04/13 1344  INR 1.00    Imaging: Imaging results have been reviewed He has not had a CXR since 8/10.  Cardiac Studies: see cath report- progression of CAD  Assessment/Plan:   Principal Problem:   NSTEMI (non-ST elevated myocardial infarction) Active Problems:   CAD - PCI 1980s, CABG X 4 1994, DES CFX April 2010   Chronic renal insufficiency, stage III (moderate)   ADENOCARCINOMA, PROSTATE, GLEASON GRADE 7   HYPERLIPIDEMIA   HYPERTENSION   OSA on CPAP    PLAN: His labs are just being drawn now. Will discuss with Dr Seferino Oscar- PCI vs CABG. Will hold Plavix and Cozaar this am. He will need a CXR before discharge.   Luke Kilroy PA-C Beeper [phone] 02/05/2013, 8:02 AM   Patient seen and examined. Agree with assessment and plan.  Discussion with pt and family last night and again with pt this am . Will plan for 2 vessel PCI LCX very focal instent restenosis site and PCI of SVG to ~LAD with filter wire. Tro + for NSTEMI. ECG with nonspecific T abnormalities yesterday. Pt meets Plato trial criteria, will change to Brilinta rather than Plavix for significantly greater inhibition of platelet aggregation (IPA).  Labs pending from this am.  I also reviewed old cath which confirms that prior sequential graft to OM1-2 had arisen as a Y graft from the very proximal portion of the free [place] graft which supplies the diagonal vessel.   Deashia Soule A. Shatavia Santor, MD, FACC 02/05/2013 8:14 AM   

## 2013-02-05 NOTE — Progress Notes (Signed)
Sheath removed from right femoral groin site and pressure held x 1hour to obtain hemostasis. Difficult hold with moderate 6/10 site pain,medicated with Morphine 2mg  IV by Primary RN with relief after 5 minutes. Patient became hypertensive briefly (see Vital flowsheet Log) but returned to baseline after pain med administered. Hemostasis obtained and site dressed with dry sterile pressure dressing. Distal pulses palpable. Patient given post procedure instructions.

## 2013-02-05 NOTE — Progress Notes (Signed)
Pt was transported to the cath lab with RN, on monitor. Report was given to cath lab staff. Family was directed to the short stay waiting area with pager.

## 2013-02-05 NOTE — Progress Notes (Addendum)
Subjective:  No chest pain.  Objective:  Vital Signs in the last 24 hours: Temp:  [97.4 F (36.3 C)-97.8 F (36.6 C)] 97.6 F (36.4 C) (10/02 0749) Pulse Rate:  [41-68] 54 (10/02 0750) Resp:  [13-23] 17 (10/02 0750) BP: (99-169)/(61-98) 129/79 mmHg (10/02 0750) SpO2:  [93 %-99 %] 97 % (10/02 0750) Weight:  [207 lb (93.895 kg)-210 lb 1.6 oz (95.3 kg)] 210 lb 1.6 oz (95.3 kg) (10/01 1207)  Intake/Output from previous day:  Intake/Output Summary (Last 24 hours) at 02/05/13 0802 Last data filed at 02/05/13 0700  Gross per 24 hour  Intake 2649.42 ml  Output    400 ml  Net 2249.42 ml    Physical Exam: General appearance: alert, cooperative and no distress No JVD Lungs: clear to auscultation bilaterally Heart: regular rate and rhythm 1/6 sem Cath site stable Extremities: no hematoma rt groin no edema   Rate: 56  Rhythm: normal sinus rhythm and sinus bradycardia  Lab Results:  Recent Labs  02/04/13 0817  WBC 6.7  HGB 16.6  PLT 196    Recent Labs  02/04/13 0817  NA 135  K 4.5  CL 100  CO2 25  GLUCOSE 122*  BUN 9  CREATININE 1.43*    Recent Labs  02/04/13 1750 02/05/13 0100  TROPONINI 0.64* 0.62*    Recent Labs  02/04/13 1344  INR 1.00    Imaging: Imaging results have been reviewed He has not had a CXR since 8/10.  Cardiac Studies: see cath report- progression of CAD  Assessment/Plan:   Principal Problem:   NSTEMI (non-ST elevated myocardial infarction) Active Problems:   CAD - PCI 1980s, CABG X 4 1994, DES CFX April 2010   Chronic renal insufficiency, stage III (moderate)   ADENOCARCINOMA, PROSTATE, GLEASON GRADE 7   HYPERLIPIDEMIA   HYPERTENSION   OSA on CPAP    PLAN: His labs are just being drawn now. Will discuss with Dr Tresa Endo- PCI vs CABG. Will hold Plavix and Cozaar this am. He will need a CXR before discharge.   Corine Shelter PA-C Beeper [phone] 02/05/2013, 8:02 AM   Patient seen and examined. Agree with assessment and plan.  Discussion with pt and family last night and again with pt this am . Will plan for 2 vessel PCI LCX very focal instent restenosis site and PCI of SVG to ~LAD with filter wire. Tro + for NSTEMI. ECG with nonspecific T abnormalities yesterday. Pt meets Plato trial criteria, will change to Brilinta rather than Plavix for significantly greater inhibition of platelet aggregation (IPA).  Labs pending from this am.  I also reviewed old cath which confirms that prior sequential graft to OM1-2 had arisen as a Y graft from the very proximal portion of the free [place] graft which supplies the diagonal vessel.   Lennette Bihari, MD, [facility] 02/05/2013 8:14 AM

## 2013-02-05 NOTE — CV Procedure (Signed)
Joshua Hahn is a 67 y.o. 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 In 1994 after suffering a myocardial infarction inferiorly catheterization revealed progressive CAD and he underwent CABG surgery with a free LIMA graft supplying the high diagonal vessel, and SVG to the mid LAD, a sequential vein graft supplying the OM1 and onto vessel which arose from the very proximal portion of the free LIMA graft and a Y graft configuration, and a saphenous vein graft to the) artery. In 2010 he underwent complex high-speed rotational atherectomy of a severely disease diffuse circumflex since the graft that supplied the OM vessel and occluded at its origin although there was still a patent sequential limb to the graft. He underwent stent insertion into the circumflex from its ostium to the mid distal segment. He had an occluded graft to his RCA and occluded native RCA but the distal RCA has been collateralized from the circumflex vessel. The past 4 years, he has done well. He recently developed recurrent chest pain symptoms. He presented [facility] yesterday with mild troponin elevation. Cardiac catheterization was performed. He was found to have very tight focal in-stent restenosis in the distal third of his aunt in the circumflex vessel and also had an 85% near ostial stenosis in the vein graft supplying the mid LAD. I treated overnight. This morning he was given Brilinta 180 mg load. He presents now for 2 vessel intervention.   PROCEDURE:  The patient was brought to the second floor [facility] cath lab in the postabsorptive state. He was premedicated initially with Versed 2 mg and fentanyl 50 mcg, but  during the procedure he received an additional 1 mg of Versed and 25 mcg of fentanyl on 2 separate occasions. His right groin was prepped and shaved in usual sterile fashion. Xylocaine 1% was used for local anesthesia. A 6 French sheath was inserted into the right femoral artery.  Attention was initially directed at the native circumflex vessel. The patient received Angiomax bolus plus infusion. ACT was documented to be therapeutic. Approximately 4 hours prior to the procedure he did receive an oral dose of Brilinta180 mg and his Plavix was discontinued. A 6 French XB 3.5 guide was inserted. A Prowater wire was advanced down the circumflex vessel. A 2.5x10 mm Angioscope balloon was then inserted and several dilatations were made at 10. 11 and 14 atmospheres corresponding to approximately 2.8 mm. There was still some mild residual waist and a 3.0x8 mm [place] Euphora balloon was then inserted and dilated up to 12 atmospheres. Scout angiography confirmed an excellent angiographic result with the 95% stenosis being reduced to 0%. Attention was then directed at the vein graft supplying the mid LAD. Initially, a 6 French left bypass graft guide was inserted. A filter wire was attempted to be advanced but the wire was able to advance but the filter wire never crossed the lesion and the guiding catheter kept backing out. The system was then removed and a 6 [place] right 3.5 guide was used. Similar results were obtained. At this time it was felt to proceed with insertion of the previous Prowater wire and this was able to be advanced easily down the circumflex.  Predilatation was done a 2.5x12 mm Sprinter balloon at the near ostial stenosis. A 3.5x15 mm Xience Xpedition stent was then carefully deployed at the ostium cover the entire very proximal lesion and dilated x2 at 11 and 12 atmospheres. A 3.5x12 mm  [place] Euphora balloon was used for post stent dilatation up to 14 atmospheres. Scout angiography confirmed an excellent  angiographic result with the 85-90% stenosis being reduced to 0%. There was brisk TIMI-3 flow and no evidence for distal embolization. During the procedure the patient did receive several doses of intracoronary nitroglycerin. Angiomax was discontinued since the patient had received his Brilinta 4 hours prior to the procedure. The arterial sheath was sutured in place with plans for sheath removal later today.  HEMODYNAMICS:   Central Aorta: 110/65    ANGIOGRAPHY:  The left main gave rise to the circumflex vessel since the LAD was occluded at its origin. The left circumflex vessel was stented diffusely with a 3.0x38 mm I. and stent in 2010. There was smooth 20%-30% 9 at the very proximal portion of the stent. After giving rise to an AV groove circumflex branch which collateralized the distal right coronary artery there was a focal 95% stenosis within the stent in the distal third of the stent. Following successful angioscope and noncompliant balloon dilatation, the 95% stenosis was reduced to 0%. There was brisk TIMI-3 flow and no evidence for dissection.  The vein graft supplying the mid LAD had an 85-90% eccentric near ostial proximal stenosis. Following intervention with predilatation with a 2.5x12 mm balloon, stenting with a 3.5x15 mm I. and 6 expedition DES stent and postdilated noncompliant balloon, the stenosis was reduced to 0%. The remainder of the vein graft was free of significant disease it anastomosed into the mid LAD with filling of the LAD retrograde to the proximal segment and distally extending around the apex. There is no evidence for dissection or evidence for apparent distal embolization.  IMPRESSION:  Successful 2 vessel coronary intervention with angiosculpt/PTCA for a focal 95% in-stent restenosis in the native left circumflex coronary artery with a 95% stenosis being reduced to 0%, and PTCA/stenting of the ostium optimal portion of the vein graft supplying an LAD with an 85-90%  stenosis reduced to 0% and ultimately insertion of a 3.5x15 mm on its expedition DES stent dilated to approximately 3.6 mm Oral Brilinta/ IV Angiomax/IC nitroglycerin  Lennette Bihari, MD, [facility] 02/05/2013 2:38 PM

## 2013-02-06 ENCOUNTER — Ambulatory Visit: Payer: Medicare Other | Admitting: Cardiovascular Disease

## 2013-02-06 DIAGNOSIS — I10 Essential (primary) hypertension: Secondary | ICD-10-CM | POA: Diagnosis not present

## 2013-02-06 DIAGNOSIS — I2582 Chronic total occlusion of coronary artery: Secondary | ICD-10-CM | POA: Diagnosis not present

## 2013-02-06 DIAGNOSIS — I214 Non-ST elevation (NSTEMI) myocardial infarction: Secondary | ICD-10-CM | POA: Diagnosis not present

## 2013-02-06 DIAGNOSIS — I25810 Atherosclerosis of coronary artery bypass graft(s) without angina pectoris: Secondary | ICD-10-CM | POA: Diagnosis not present

## 2013-02-06 DIAGNOSIS — N183 Chronic kidney disease, stage 3 unspecified: Secondary | ICD-10-CM

## 2013-02-06 DIAGNOSIS — G4733 Obstructive sleep apnea (adult) (pediatric): Secondary | ICD-10-CM | POA: Diagnosis not present

## 2013-02-06 DIAGNOSIS — T82897A Other specified complication of cardiac prosthetic devices, implants and grafts, initial encounter: Secondary | ICD-10-CM | POA: Diagnosis not present

## 2013-02-06 LAB — BASIC METABOLIC PANEL
BUN: 9 mg/dL (ref 6–23)
CO2: 18 mEq/L — ABNORMAL LOW (ref 19–32)
Calcium: 8.7 mg/dL (ref 8.4–10.5)
Chloride: 104 mEq/L (ref 96–112)
Glucose, Bld: 105 mg/dL — ABNORMAL HIGH (ref 70–99)
Sodium: 134 mEq/L — ABNORMAL LOW (ref 135–145)

## 2013-02-06 LAB — CBC
HCT: 44.4 % (ref 39.0–52.0)
MCH: 31 pg (ref 26.0–34.0)
Platelets: 187 10*3/uL (ref 150–400)
RBC: 5.06 MIL/uL (ref 4.22–5.81)
WBC: 7.2 10*3/uL (ref 4.0–10.5)

## 2013-02-06 LAB — POCT ACTIVATED CLOTTING TIME: Activated Clotting Time: 503 seconds

## 2013-02-06 MED ORDER — PANTOPRAZOLE SODIUM 40 MG PO TBEC
40.0000 mg | DELAYED_RELEASE_TABLET | Freq: Every day | ORAL | Status: DC
  Administered 2013-02-07: 40 mg via ORAL
  Filled 2013-02-06: qty 1

## 2013-02-06 MED ORDER — ISOSORBIDE MONONITRATE ER 60 MG PO TB24
60.0000 mg | ORAL_TABLET | Freq: Every day | ORAL | Status: DC
  Administered 2013-02-06 – 2013-02-07 (×2): 60 mg via ORAL
  Filled 2013-02-06 (×2): qty 1

## 2013-02-06 MED ORDER — FUROSEMIDE 10 MG/ML IJ SOLN
40.0000 mg | Freq: Once | INTRAMUSCULAR | Status: AC
  Administered 2013-02-06: 40 mg via INTRAVENOUS
  Filled 2013-02-06: qty 4

## 2013-02-06 NOTE — Progress Notes (Signed)
CARDIAC REHAB PHASE I   PRE:  Rate/Rhythm: 60SR  BP:  Supine: 168/97  Sitting:   Standing:    SaO2:   MODE:  Ambulation: 490 ft   POST:  Rate/Rhythm: 77SR  BP:  Supine:   Sitting: 185/86, 190/92, after rest 157/84  Standing:    SaO2:  0958-1100 Pt walked 490 ft with steady gait. Tolerated well.  No CP. MI education completed with pt. Gave brilinta packet and discussed brililnta use with stent. Could not find stent booklet so cath lab called to send replacement. Pt has attended CRP 2 before and would like referral to [place] Phase 2. BP elevated. Not dizzy or with headache. Pt states he just got BP meds. BP lower with rest. To recliner with call bell.     Luetta Nutting, RN BSN  02/06/2013 10:52 AM

## 2013-02-06 NOTE — Progress Notes (Signed)
DAILY PROGRESS NOTE  Subjective:  No events noted overnight. S/p 2 vessel PCI yesterday - angiosculpt PTCA to 95% in-stent restenosis of the LCX and PTCA/Stenting of the ostial SVG to mid-LAD.  No chest pain complaints. Groin is level 0.  Objective:  Temp:  [97.5 F (36.4 C)-98.6 F (37 C)] 97.5 F (36.4 C) (10/03 0753) Pulse Rate:  [43-62] 51 (10/03 0200) Resp:  [0-29] 16 (10/03 0753) BP: (124-180)/(70-103) 152/97 mmHg (10/03 0434) SpO2:  [93 %-99 %] 96 % (10/03 0753) Weight change: 3 lb 8.6 oz (1.605 kg)  Intake/Output from previous day: 10/02 0701 - 10/03 0700 In: 3124.4 [P.O.:480; I.V.:2644.4] Out: 1200 [Urine:1200]  Intake/Output from this shift:    Medications: Current Facility-Administered Medications  Medication Dose Route Frequency Provider Last Rate Last Dose  . 0.9 %  sodium chloride infusion   Intravenous Continuous Lennette Bihari, MD 125 mL/hr at 02/06/13 0055    . acetaminophen (TYLENOL) tablet 650 mg  650 mg Oral Q4H PRN Lennette Bihari, MD   650 mg at 02/04/13 2211  . ALPRAZolam Prudy Feeler) tablet 0.25 mg  0.25 mg Oral TID PRN Abelino Derrick, PA-C      . aspirin EC tablet 81 mg  81 mg Oral Daily Lennette Bihari, MD      . atorvastatin (LIPITOR) tablet 80 mg  80 mg Oral [address] Henriette, PA-C   80 mg at 02/05/13 1747  . cholecalciferol (VITAMIN D) tablet 1,000 Units  1,000 Units Oral Daily Abelino Derrick, PA-C   1,000 Units at 02/05/13 4540  . heparin ADULT infusion 100 units/mL (25000 units/250 mL)  1,300 Units/hr Intravenous Continuous Arman Filter, RPH   1,300 Units/hr at 02/05/13 9811  . HYDROmorphone (DILAUDID) injection 1 mg  1 mg Intravenous Q4H PRN Abelino Derrick, PA-C      . metoprolol succinate (TOPROL-XL) 24 hr tablet 25 mg  25 mg Oral Daily Abelino Derrick, PA-C   25 mg at 02/05/13 0925  . nitroGLYCERIN (NITROSTAT) SL tablet 0.4 mg  0.4 mg Sublingual Q5 min PRN Suzi Roots, MD   0.4 mg at 02/04/13 0851  . nitroGLYCERIN 0.2 mg/mL in dextrose 5 %  infusion  2-200 mcg/min Intravenous Continuous Lennette Bihari, MD 3 mL/hr at 02/05/13 1905 10 mcg/min at 02/05/13 1905  . omega-3 acid ethyl esters (LOVAZA) capsule 2 g  2 g Oral BID Abelino Derrick, PA-C   2 g at 02/05/13 2215  . ondansetron (ZOFRAN) injection 4 mg  4 mg Intravenous Q6H PRN Lennette Bihari, MD      . pantoprazole (PROTONIX) EC tablet 40 mg  40 mg Oral Q0600 Abelino Derrick, PA-C   40 mg at 02/06/13 0804  . ranolazine (RANEXA) 12 hr tablet 500 mg  500 mg Oral BID Abelino Derrick, PA-C   500 mg at 02/05/13 2215  . Ticagrelor (BRILINTA) tablet 90 mg  90 mg Oral BID Lennette Bihari, MD   90 mg at 02/05/13 2215  . traMADol (ULTRAM) tablet 50 mg  50 mg Oral Q6H PRN Abelino Derrick, PA-C   50 mg at 02/06/13 0340  . vitamin B-12 (CYANOCOBALAMIN) tablet 100 mcg  100 mcg Oral Daily Abelino Derrick, PA-C   100 mcg at 02/05/13 9147  . vitamin C (ASCORBIC ACID) tablet 1,000 mg  1,000 mg Oral Daily Abelino Derrick, PA-C   1,000 mg at 02/05/13 0925  . zolpidem (AMBIEN) tablet 5 mg  5 mg Oral QHS PRN Abelino Derrick, PA-C        Physical Exam: General appearance: alert and no distress Neck: no adenopathy, no carotid bruit, no JVD, supple, symmetrical, trachea midline and thyroid not enlarged, symmetric, no tenderness/mass/nodules Lungs: clear to auscultation bilaterally Heart: regular rate and rhythm, S1, S2 normal, no murmur, click, rub or gallop Abdomen: soft, non-tender; bowel sounds normal; no masses,  no organomegaly Extremities: extremities normal, atraumatic, no cyanosis or edema Pulses: 2+ and symmetric Skin: Skin color, texture, turgor normal. No rashes or lesions Neurologic: Grossly normal  Lab Results: Results for orders placed during the hospital encounter of 02/04/13 (from the past 48 hour(s))  POCT I-STAT TROPONIN I     Status: Abnormal   Collection Time    02/04/13  8:45 AM      Result Value Range   Troponin i, poc 0.27 (*) 0.00 - 0.08 ng/mL   Comment NOTIFIED PHYSICIAN     Comment 3             Comment: Due to the release kinetics of cTnI,     a negative result within the first hours     of the onset of symptoms does not rule out     myocardial infarction with certainty.     If myocardial infarction is still suspected,     repeat the test at appropriate intervals.  MRSA PCR SCREENING     Status: None   Collection Time    02/04/13 11:20 AM      Result Value Range   MRSA by PCR NEGATIVE  NEGATIVE   Comment:            The GeneXpert MRSA Assay (FDA     approved for NASAL specimens     only), is one component of a     comprehensive MRSA colonization     surveillance program. It is not     intended to diagnose MRSA     infection nor to guide or     monitor treatment for     MRSA infections.  PROTIME-INR     Status: None   Collection Time    02/04/13  1:44 PM      Result Value Range   Prothrombin Time 13.0  11.6 - 15.2 seconds   INR 1.00  0.00 - 1.49  TROPONIN I     Status: Abnormal   Collection Time    02/04/13  1:45 PM      Result Value Range   Troponin I 0.38 (*) <0.30 ng/mL   Comment:            Due to the release kinetics of cTnI,     a negative result within the first hours     of the onset of symptoms does not rule out     myocardial infarction with certainty.     If myocardial infarction is still suspected,     repeat the test at appropriate intervals.     REPEATED TO VERIFY     CRITICAL RESULT CALLED TO, READ BACK BY AND VERIFIED WITH:     T PENNINGTON,RN 1529 02/04/13 D BRADLEY  POCT ACTIVATED CLOTTING TIME     Status: None   Collection Time    02/04/13  3:19 PM      Result Value Range   Activated Clotting Time 124    TROPONIN I     Status: Abnormal   Collection Time    02/04/13  5:50 PM      Result Value Range   Troponin I 0.64 (*) <0.30 ng/mL   Comment:            Due to the release kinetics of cTnI,     a negative result within the first hours     of the onset of symptoms does not rule out     myocardial infarction with certainty.      If myocardial infarction is still suspected,     repeat the test at appropriate intervals.     REPEATED TO VERIFY     CRITICAL VALUE NOTED.  VALUE IS CONSISTENT WITH PREVIOUSLY REPORTED AND CALLED VALUE.  TROPONIN I     Status: Abnormal   Collection Time    02/05/13  1:00 AM      Result Value Range   Troponin I 0.62 (*) <0.30 ng/mL   Comment:            Due to the release kinetics of cTnI,     a negative result within the first hours     of the onset of symptoms does not rule out     myocardial infarction with certainty.     If myocardial infarction is still suspected,     repeat the test at appropriate intervals.     REPEATED TO VERIFY     CRITICAL VALUE NOTED.  VALUE IS CONSISTENT WITH PREVIOUSLY REPORTED AND CALLED VALUE.  HEPARIN LEVEL (UNFRACTIONATED)     Status: None   Collection Time    02/05/13  7:45 AM      Result Value Range   Heparin Unfractionated 0.43  0.30 - 0.70 IU/mL   Comment:            IF HEPARIN RESULTS ARE BELOW     EXPECTED VALUES, AND PATIENT     DOSAGE HAS BEEN CONFIRMED,     SUGGEST FOLLOW UP TESTING     OF ANTITHROMBIN III LEVELS.  CBC     Status: None   Collection Time    02/05/13  7:45 AM      Result Value Range   WBC 6.4  4.0 - 10.5 K/uL   RBC 4.89  4.22 - 5.81 MIL/uL   Hemoglobin 14.8  13.0 - 17.0 g/dL   HCT 16.1  09.6 - 04.5 %   MCV 89.2  78.0 - 100.0 fL   MCH 30.3  26.0 - 34.0 pg   MCHC 33.9  30.0 - 36.0 g/dL   RDW 40.9  81.1 - 91.4 %   Platelets 181  150 - 400 K/uL  BASIC METABOLIC PANEL     Status: Abnormal   Collection Time    02/05/13  7:45 AM      Result Value Range   Sodium 135  135 - 145 mEq/L   Potassium 4.5  3.5 - 5.1 mEq/L   Chloride 103  96 - 112 mEq/L   CO2 22  19 - 32 mEq/L   Glucose, Bld 122 (*) 70 - 99 mg/dL   BUN 8  6 - 23 mg/dL   Creatinine, Ser 7.82 (*) 0.50 - 1.35 mg/dL   Calcium 9.1  8.4 - 95.6 mg/dL   GFR calc non Af Amer 50 (*) >90 mL/min   GFR calc Af Amer 58 (*) >90 mL/min   Comment: (NOTE)     The eGFR  has been calculated using the CKD EPI equation.     This calculation has not been validated  in all clinical situations.     eGFR's persistently <90 mL/min signify possible Chronic Kidney     Disease.  CBC     Status: None   Collection Time    02/06/13  4:40 AM      Result Value Range   WBC 7.2  4.0 - 10.5 K/uL   RBC 5.06  4.22 - 5.81 MIL/uL   Hemoglobin 15.7  13.0 - 17.0 g/dL   HCT 16.1  09.6 - 04.5 %   MCV 87.7  78.0 - 100.0 fL   MCH 31.0  26.0 - 34.0 pg   MCHC 35.4  30.0 - 36.0 g/dL   RDW 40.9  81.1 - 91.4 %   Platelets 187  150 - 400 K/uL  BASIC METABOLIC PANEL     Status: Abnormal   Collection Time    02/06/13  4:40 AM      Result Value Range   Sodium 134 (*) 135 - 145 mEq/L   Potassium 4.7  3.5 - 5.1 mEq/L   Chloride 104  96 - 112 mEq/L   CO2 18 (*) 19 - 32 mEq/L   Glucose, Bld 105 (*) 70 - 99 mg/dL   BUN 9  6 - 23 mg/dL   Creatinine, Ser 7.82 (*) 0.50 - 1.35 mg/dL   Calcium 8.7  8.4 - 95.6 mg/dL   GFR calc non Af Amer 51 (*) >90 mL/min   GFR calc Af Amer 60 (*) >90 mL/min   Comment: (NOTE)     The eGFR has been calculated using the CKD EPI equation.     This calculation has not been validated in all clinical situations.     eGFR's persistently <90 mL/min signify possible Chronic Kidney     Disease.    Imaging: No results found.  Assessment:  1. Principal Problem: 2.   NSTEMI (non-ST elevated myocardial infarction) 3. Active Problems: 4.   ADENOCARCINOMA, PROSTATE, GLEASON GRADE 7 5.   HYPERLIPIDEMIA 6.   HYPERTENSION 7.   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   OSA on CPAP 9.   Chronic renal insufficiency, stage III (moderate) 10.   Plan:  1. He feels well today, other than mild dyspnea. BNP was mildly elevated on admit and he has been aggressively hydrated overnight at 125 cc/hr. Will give single dose lasix 40 mg today. BP still mildly elevated. Restart home imdur 60 mg daily. D/C nitro gtts.  Anticipate d/c in the am tomorrow. Ambulate  with cardiac rehab today.  Time Spent Directly with Patient:  15 minutes  Length of Stay:  LOS: 2 days   Chrystie Nose, MD, [facility] Attending Cardiologist [facility]  Tykiera Raven C 02/06/2013, 8:18 AM

## 2013-02-07 DIAGNOSIS — I214 Non-ST elevation (NSTEMI) myocardial infarction: Secondary | ICD-10-CM | POA: Diagnosis not present

## 2013-02-07 DIAGNOSIS — G4733 Obstructive sleep apnea (adult) (pediatric): Secondary | ICD-10-CM | POA: Diagnosis not present

## 2013-02-07 DIAGNOSIS — Z9861 Coronary angioplasty status: Secondary | ICD-10-CM

## 2013-02-07 LAB — BASIC METABOLIC PANEL
CO2: 25 mEq/L (ref 19–32)
Calcium: 9.5 mg/dL (ref 8.4–10.5)
GFR calc non Af Amer: 44 mL/min — ABNORMAL LOW (ref >90)
Potassium: 4.1 mEq/L (ref 3.5–5.1)
Sodium: 134 mEq/L — ABNORMAL LOW (ref 135–145)

## 2013-02-07 LAB — CBC
HCT: 42.4 % (ref 39.0–52.0)
Hemoglobin: 15 g/dL (ref 13.0–17.0)
MCH: 31.3 pg (ref 26.0–34.0)
MCHC: 35.4 g/dL (ref 30.0–36.0)
Platelets: 181 10*3/uL (ref 150–400)
RBC: 4.8 MIL/uL (ref 4.22–5.81)

## 2013-02-07 MED ORDER — TICAGRELOR 90 MG PO TABS: 90.0000 mg | ORAL_TABLET | Freq: Two times a day (BID) | ORAL | Status: DC

## 2013-02-07 NOTE — Progress Notes (Signed)
Discharged home by wheelchair, stable, discharge instructions given to pt.  , belongings with pt.

## 2013-02-07 NOTE — Progress Notes (Signed)
DAILY PROGRESS NOTE  Subjective:  No events overnight. Ambulated without chest pain or significant dyspnea. Blood pressure is improved today.  Objective:  Temp:  [97.6 F (36.4 C)-98.7 F (37.1 C)] 98 F (36.7 C) (10/04 0600) Resp:  [15-17] 17 (10/03 2025) BP: (132-142)/(68-84) 141/68 mmHg (10/04 0600) SpO2:  [95 %-98 %] 98 % (10/04 0600) Weight change:   Intake/Output from previous day: 10/03 0701 - 10/04 0700 In: 1208 [P.O.:1080; I.V.:128] Out: -   Intake/Output from this shift:    Medications: Current Facility-Administered Medications  Medication Dose Route Frequency Provider Last Rate Last Dose  . acetaminophen (TYLENOL) tablet 650 mg  650 mg Oral Q4H PRN Lennette Bihari, MD   650 mg at 02/04/13 2211  . ALPRAZolam Prudy Feeler) tablet 0.25 mg  0.25 mg Oral TID PRN Abelino Derrick, PA-C      . aspirin EC tablet 81 mg  81 mg Oral Daily Lennette Bihari, MD   81 mg at 02/06/13 0954  . atorvastatin (LIPITOR) tablet 80 mg  80 mg Oral [address] [place], PA-C   80 mg at 02/06/13 1752  . cholecalciferol (VITAMIN D) tablet 1,000 Units  1,000 Units Oral Daily Abelino Derrick, PA-C   1,000 Units at 02/06/13 4540  . HYDROmorphone (DILAUDID) injection 1 mg  1 mg Intravenous Q4H PRN Abelino Derrick, PA-C      . isosorbide mononitrate (IMDUR) 24 hr tablet 60 mg  60 mg Oral Daily Chrystie Nose, MD   60 mg at 02/06/13 0955  . metoprolol succinate (TOPROL-XL) 24 hr tablet 25 mg  25 mg Oral Daily Abelino Derrick, PA-C   25 mg at 02/06/13 0955  . nitroGLYCERIN (NITROSTAT) SL tablet 0.4 mg  0.4 mg Sublingual Q5 min PRN Suzi Roots, MD   0.4 mg at 02/04/13 0851  . omega-3 acid ethyl esters (LOVAZA) capsule 2 g  2 g Oral BID Abelino Derrick, PA-C   2 g at 02/06/13 2142  . ondansetron (ZOFRAN) injection 4 mg  4 mg Intravenous Q6H PRN Lennette Bihari, MD   4 mg at 02/06/13 1856  . pantoprazole (PROTONIX) EC tablet 40 mg  40 mg Oral Q0600 Lennette Bihari, MD      . ranolazine [facility]) 12 hr tablet 500 mg   500 mg Oral BID Abelino Derrick, PA-C   500 mg at 02/06/13 2142  . Ticagrelor (BRILINTA) tablet 90 mg  90 mg Oral BID Lennette Bihari, MD   90 mg at 02/06/13 2142  . traMADol (ULTRAM) tablet 50 mg  50 mg Oral Q6H PRN Abelino Derrick, PA-C   50 mg at 02/06/13 0340  . vitamin B-12 (CYANOCOBALAMIN) tablet 100 mcg  100 mcg Oral Daily Abelino Derrick, PA-C   100 mcg at 02/06/13 0955  . vitamin C (ASCORBIC ACID) tablet 1,000 mg  1,000 mg Oral Daily Eda Paschal [place], PA-C   1,000 mg at 02/06/13 9811  . zolpidem (AMBIEN) tablet 5 mg  5 mg Oral QHS PRN Abelino Derrick, PA-C        Physical Exam: General appearance: alert and no distress Lungs: clear to auscultation bilaterally Heart: regular rate and rhythm, S1, S2 normal, no murmur, click, rub or gallop Extremities: extremities normal, atraumatic, no cyanosis or edema Neurologic: Grossly normal  Lab Results: Results for orders placed during the hospital encounter of 02/04/13 (from the past 48 hour(s))  POCT ACTIVATED CLOTTING TIME  Status: None   Collection Time    02/05/13 12:52 PM      Result Value Range   Activated Clotting Time 503    CBC     Status: None   Collection Time    02/06/13  4:40 AM      Result Value Range   WBC 7.2  4.0 - 10.5 K/uL   RBC 5.06  4.22 - 5.81 MIL/uL   Hemoglobin 15.7  13.0 - 17.0 g/dL   HCT 16.1  09.6 - 04.5 %   MCV 87.7  78.0 - 100.0 fL   MCH 31.0  26.0 - 34.0 pg   MCHC 35.4  30.0 - 36.0 g/dL   RDW 40.9  81.1 - 91.4 %   Platelets 187  150 - 400 K/uL  BASIC METABOLIC PANEL     Status: Abnormal   Collection Time    02/06/13  4:40 AM      Result Value Range   Sodium 134 (*) 135 - 145 mEq/L   Potassium 4.7  3.5 - 5.1 mEq/L   Chloride 104  96 - 112 mEq/L   CO2 18 (*) 19 - 32 mEq/L   Glucose, Bld 105 (*) 70 - 99 mg/dL   BUN 9  6 - 23 mg/dL   Creatinine, Ser 7.82 (*) 0.50 - 1.35 mg/dL   Calcium 8.7  8.4 - 95.6 mg/dL   GFR calc non Af Amer 51 (*) >90 mL/min   GFR calc Af Amer 60 (*) >90 mL/min   Comment: (NOTE)       The eGFR has been calculated using the CKD EPI equation.     This calculation has not been validated in all clinical situations.     eGFR's persistently <90 mL/min signify possible Chronic Kidney     Disease.  CBC     Status: None   Collection Time    02/07/13  4:40 AM      Result Value Range   WBC 9.3  4.0 - 10.5 K/uL   RBC 4.80  4.22 - 5.81 MIL/uL   Hemoglobin 15.0  13.0 - 17.0 g/dL   HCT 21.3  08.6 - 57.8 %   MCV 88.3  78.0 - 100.0 fL   MCH 31.3  26.0 - 34.0 pg   MCHC 35.4  30.0 - 36.0 g/dL   RDW 46.9  62.9 - 52.8 %   Platelets 181  150 - 400 K/uL  BASIC METABOLIC PANEL     Status: Abnormal   Collection Time    02/07/13  4:40 AM      Result Value Range   Sodium 134 (*) 135 - 145 mEq/L   Potassium 4.1  3.5 - 5.1 mEq/L   Chloride 99  96 - 112 mEq/L   CO2 25  19 - 32 mEq/L   Glucose, Bld 114 (*) 70 - 99 mg/dL   BUN 10  6 - 23 mg/dL   Creatinine, Ser 4.13 (*) 0.50 - 1.35 mg/dL   Calcium 9.5  8.4 - 24.4 mg/dL   GFR calc non Af Amer 44 (*) >90 mL/min   GFR calc Af Amer 51 (*) >90 mL/min   Comment: (NOTE)     The eGFR has been calculated using the CKD EPI equation.     This calculation has not been validated in all clinical situations.     eGFR's persistently <90 mL/min signify possible Chronic Kidney     Disease.    Imaging: No results found.  Assessment:  1. Principal Problem: 2.   NSTEMI (non-ST elevated myocardial infarction) 3. Active Problems: 4.   ADENOCARCINOMA, PROSTATE, GLEASON GRADE 7 5.   HYPERLIPIDEMIA 6.   HYPERTENSION 7.   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   OSA on CPAP 9.   Chronic renal insufficiency, stage III (moderate) 10.   Plan:  1. Stable after 2 vessel intervention. Creatinine up slightly but stable around baseline today. Would recommend repeat BMP in 1 week. I feel that he could safely be discharged home today. He is stable on new / adjusted dose medications. He is interested in [facility] Phase II cardiac rehab.  Follow-up with Dr. Tresa Endo in 7-10 days.  Time Spent Directly with Patient:  15 minutes  Length of Stay:  LOS: 3 days   Chrystie Nose, MD, [facility] Attending Cardiologist [facility]  Ellanora Rayborn C 02/07/2013, 7:57 AM

## 2013-02-07 NOTE — Progress Notes (Signed)
CARDIAC REHAB PHASE I   PRE:  Rate/Rhythm: Sinus Rhythm 67  BP:   Sitting: 159/84     SaO2: 98% Room Air  MODE:  Ambulation: 600 ft   POST:  Rate/Rhythem:Sinus Rhythm 67  BP:   Sitting: 154/77     SaO2: 98% Room Air  [phone]  Patient ambulated in the hallway independently without complaints or chest pain. Tolerated ambulation without difficulty. Patient assisted back to bed. Family present.  Ajiah Mcglinn, Arta Bruce RN BSN

## 2013-02-08 NOTE — Discharge Summary (Signed)
Physician Discharge Summary  Patient ID: Joshua Hahn MRN: [id number] DOB/AGE: 01-27-1946 67 y.o.  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 He had a PCI in the 1980s, CABG X 4 in 1994 (free LIMA-Dx, SVG-LAD, SVG-OM1OM2, SVG-RCA). He had a DES placed to his CFX in April 2010. Myoview JAN 2013 low risk. He just saw Dr Tresa Endo 01/11/13 and was having some chest pain. Dr Tresa Endo suggested increasing his Imdur to 90 mg and increasing his Ranexa to 1000 mg BID. He was to have a Myoview however, the pt developed SSCP at (6 am) that woke him up. This was associated with Lt jaw pain, nausea and vomiting, and diaphoresis. In the ER his symptoms were relieved with NTG X 1. His POC marker is elevated at 0.27. His EKG shows no acute changes. The pt admits he did not make the medication changes suggested by Dr Tresa Endo.   The patient was admitted.  Started on IV heparin and NTG.  He was hydrated due to renal insufficiency.  He underwent LHC which revealed:   Mild LV dysfunction with ejection fraction of 50% with mild anterolateral hypocontractility.   Significant native coronary disease with total occlusion of the LAD at its origin from the left main, diffusely  stented proximal to mid left circumflex coronary artery with smooth 20-30% narrowing within the stented  segment and focal 95% distal third stenosis in the stent with mild 40% distal circumflex narrowing beyond  the stented segment.  Old native proximal RCA occlusion with extensive left to right  collaterals arising from  the circumflex vessel proximal to the focal 95% stenosis.  Old vein graft occlusion to the right coronary  artery.  Old vein graft occlusion which previously had been a sequential graft to the OM1/ 2 and had formerly  arisen from the body of the free LIMA graft proximally  Saphenous vein graft supplying the mid LAD with  80-90% near ostial-proximal stenosis.  The patient received a single dose of lasix the day following his cath.  Home imdur 60 mg daily was restarted.  The patient was seen by Dr. Rennis Golden who felt he was stable for DC home.  Followup arranged    Consults:  Cardiac rehab  Significant Diagnostic Studies:  PROCEDURE:  The patient was brought to the second floor [facility] cath lab in the postabsorptive state. He was premedicated initially with Versed 2 mg and fentanyl 50 mcg, but during the procedure he received an additional 1 mg of Versed and 25 mcg of fentanyl on 2 separate occasions. His right groin was prepped and shaved in usual sterile fashion. Xylocaine 1% was used for local anesthesia. A 6 French sheath was inserted into the right femoral artery.  Attention was initially directed at the native circumflex vessel. The patient received Angiomax bolus plus infusion. ACT was documented to be therapeutic. Approximately 4 hours prior to the procedure he did receive an oral dose of Brilinta180 mg and his Plavix was discontinued. A 6 [place] XB 3.5 guide was  inserted. A Prowater wire was advanced down the circumflex vessel. A 2.5x10 mm Angioscope balloon was then inserted and several dilatations were made at 10. 11 and 14 atmospheres corresponding to approximately 2.8 mm. There was still some mild residual waist and a 3.0x8 mm [place] Euphora balloon was then inserted and dilated up to 12 atmospheres. Scout angiography confirmed an excellent angiographic result with the 95% stenosis being reduced to 0%.  Attention was then directed at the vein graft supplying the  mid LAD. Initially, a 6 French left bypass graft guide was inserted. A filter wire was attempted to be advanced but the wire was able to advance but the filter wire never crossed the lesion and the guiding catheter kept backing out. The system was then removed and a 6 [place] right 3.5 guide was used. Similar results were obtained. At this time it was felt to proceed with insertion of the previous Prowater wire and this was able to be advanced easily down the circumflex. Predilatation was done a 2.5x12 mm Sprinter balloon at the near ostial stenosis. A 3.5x15 mm Xience Xpedition stent was then carefully deployed at the ostium cover the entire very proximal lesion and dilated x2 at 11 and 12 atmospheres. A 3.5x12 mm [place] Euphora balloon was used for post stent dilatation up to 14 atmospheres. Scout angiography confirmed an excellent angiographic result with the 85-90% stenosis being reduced to 0%. There was brisk TIMI-3 flow and no evidence for distal embolization. During the procedure the patient did receive several doses of intracoronary nitroglycerin. Angiomax was discontinued since the patient had received his Brilinta 4 hours prior to the procedure. The arterial sheath was sutured in place with plans for sheath removal later today.  HEMODYNAMICS:  Central Aorta: 110/65  ANGIOGRAPHY:  The left main gave rise to the circumflex vessel since the LAD was occluded at its origin. The left circumflex vessel was stented diffusely with a 3.0x38 mm I. and stent in 2010. There was smooth 20%-30% 9 at the very proximal portion of the stent. After giving rise to an AV groove circumflex branch which collateralized the distal right coronary artery there was a focal 95% stenosis within the stent in the distal third of the stent. Following successful angioscope and noncompliant balloon dilatation, the 95% stenosis was reduced to 0%. There was brisk TIMI-3 flow and no evidence for dissection.  The vein graft supplying the mid  LAD had an 85-90% eccentric near ostial proximal stenosis. Following intervention with predilatation with a 2.5x12 mm balloon, stenting with a 3.5x15 mm I. and 6 expedition DES stent and postdilated noncompliant balloon, the stenosis was reduced to 0%. The remainder of the vein graft was free of significant disease it anastomosed into the mid LAD with filling of the LAD retrograde to the proximal segment and distally extending around the apex. There is no evidence for dissection or evidence for apparent distal embolization.  IMPRESSION:  Successful 2 vessel coronary intervention with angiosculpt/PTCA for a focal 95% in-stent restenosis in the native left circumflex coronary artery with a 95% stenosis being reduced to 0%, and PTCA/stenting of the ostium optimal portion of the vein graft supplying an LAD with an 85-90% stenosis reduced to 0% and ultimately insertion of a 3.5x15 mm on its expedition DES stent dilated to approximately 3.6 mm  Oral Brilinta/ IV Angiomax/IC nitroglycerin  Lennette Bihari, MD, [facility]  02/05/2013  2:38 PM    Treatments: See above  Discharge Exam: Blood pressure 154/84, pulse 65, temperature 97.8 F (  36.6 C), temperature source Oral, resp. rate 16, height 5\' 10"  (1.778 m), weight 210 lb 8.6 oz (95.5 kg), SpO2 98.00%.   Disposition: 01-Home or Self Care  Discharge Orders   Future Orders Complete By Expires   Amb Referral to Cardiac Rehabilitation  As directed    Diet - low sodium heart healthy  As directed    Discharge instructions  As directed    Comments:     No lifting more than a half gallon of milk or driving for three days.   Increase activity slowly  As directed        Medication List    STOP taking these medications       clopidogrel 75 MG tablet  Commonly known as:  PLAVIX     losartan 50 MG tablet  Commonly known as:  COZAAR     valsartan 80 MG tablet  Commonly known as:  DIOVAN      TAKE these medications       aspirin 81 MG tablet  Take  81 mg by mouth daily.     cholecalciferol 1000 UNITS tablet  Commonly known as:  VITAMIN D  Take 1,000 Units by mouth daily.     Co Q 10 100 MG Caps  Take 300 mg by mouth every evening.     isosorbide mononitrate 60 MG 24 hr tablet  Commonly known as:  IMDUR  Take 60 mg by mouth daily.     metoprolol succinate 50 MG 24 hr tablet  Commonly known as:  TOPROL-XL  Take 25 mg by mouth daily. Take with or immediately following a meal.     nitroGLYCERIN 0.4 MG SL tablet  Commonly known as:  NITROSTAT  Place 0.4 mg under the tongue every 5 (five) minutes as needed for chest pain.     omega-3 acid ethyl esters 1 G capsule  Commonly known as:  LOVAZA  Take 2 g by mouth 2 (two) times daily.     ranolazine 500 MG 12 hr tablet  Commonly known as:  RANEXA  Take 500 mg by mouth 2 (two) times daily.     rosuvastatin 40 MG tablet  Commonly known as:  CRESTOR  Take 40 mg by mouth daily.     Ticagrelor 90 MG Tabs tablet  Commonly known as:  BRILINTA  Take 1 tablet (90 mg total) by mouth 2 (two) times daily.     vitamin B-12 100 MCG tablet  Commonly known as:  CYANOCOBALAMIN  Take 100 mcg by mouth daily.     vitamin C 500 MG tablet  Commonly known as:  ASCORBIC ACID  Take 1,000 mg by mouth daily.           Follow-up Information   Follow up with Lennette Bihari, MD. (Our office scheduler will call you with the appt. date and time.)    Specialty:  Cardiology   Contact information:   [address] [place] [address] [phone]       Signed: Wilburt Finlay 02/08/2013, 4:35 PM

## 2013-02-09 ENCOUNTER — Telehealth: Payer: Self-pay | Admitting: Cardiovascular Disease

## 2013-02-09 MED ORDER — RANOLAZINE ER 500 MG PO TB12: 500.0000 mg | ORAL_TABLET | Freq: Two times a day (BID) | ORAL | Status: DC

## 2013-02-09 MED ORDER — TICAGRELOR 90 MG PO TABS: 90.0000 mg | ORAL_TABLET | Freq: Two times a day (BID) | ORAL | Status: DC

## 2013-02-09 NOTE — Telephone Encounter (Signed)
Noted routed to [place].

## 2013-02-09 NOTE — Telephone Encounter (Signed)
Spoke with patient in reference to the medications that he was discharged home from the hospital with. He wanted to confirm that he is supposed to stop the plavix. I told him yes this is being replaced with Brilinta.He also requested hard copy prescriptions be mailed to him for Ranexa and Brilinta to take to [facility] outpatient pharmacy when he needs them. Printed and had Marcelina Morel to sign. Mailed to patient as requested.

## 2013-02-09 NOTE — Telephone Encounter (Signed)
Please call-concerning his medicine. °

## 2013-02-09 NOTE — Telephone Encounter (Signed)
He wants Joshua Hahn to call him about hs medicine please.

## 2013-02-09 NOTE — Telephone Encounter (Signed)
Patient requesting to talk w/Joshua Hahn only.  Message routed to [place].

## 2013-02-09 NOTE — ED Provider Notes (Signed)
I saw and evaluated the patient, reviewed the resident's note and I agree with the findings and plan. Pt with hx cad, c/o intermittent chest pain, more pronounced since last pm.  Mid chest pain/tighthess w sob. Chest cta. No chest wall tenderness. No leg edema or pain/tenderness. Iv x 2, labs, 02 [place], cxr.   Ntg sl. Cp initially resolved, then later recurs.  ntg gtt. Troponin elev. Card consulted - will see in ed and admit.  Heparin bolus/gtt per pharmacy.  Recheck pt x several occasions, cp resolved on ntg gtt. Has recd asa and on heparing gtt. Cardiology eval.  CRITICAL CARE  Recurrent, persistent chest pain, elevated troponin, acute coronary syndrome.  Performed by: Suzi Roots Total critical care time: 35 Critical care time was exclusive of separately billable procedures and treating other patients. Critical care was necessary to treat or prevent imminent or life-threatening deterioration. Critical care was time spent personally by me on the following activities: development of treatment plan with patient and/or surrogate as well as nursing, discussions with consultants, evaluation of patient's response to treatment, examination of patient, obtaining history from patient or surrogate, ordering and performing treatments and interventions, ordering and review of laboratory studies, ordering and review of radiographic studies, pulse oximetry and re-evaluation of patient's condition.   Suzi Roots, MD 02/09/13 [phone]

## 2013-02-12 ENCOUNTER — Telehealth: Payer: Self-pay | Admitting: Cardiovascular Disease

## 2013-02-12 NOTE — Telephone Encounter (Signed)
Wants Burna Mortimer to call him.

## 2013-02-13 ENCOUNTER — Telehealth: Payer: Self-pay | Admitting: *Deleted

## 2013-02-13 NOTE — Telephone Encounter (Signed)
Returned patient's call. He wanted to know where he could get his CPAP supplies. He had gone somewhere to get supplies and was told he needs a prescription. I told patient to call me back with the name of the company that he uses and I will take care of the prescription. Patient will call back to leave the name of the DME company.

## 2013-02-20 ENCOUNTER — Encounter: Payer: Self-pay | Admitting: Cardiovascular Disease

## 2013-02-20 ENCOUNTER — Ambulatory Visit (INDEPENDENT_AMBULATORY_CARE_PROVIDER_SITE_OTHER): Payer: Medicare Other | Admitting: Cardiovascular Disease

## 2013-02-20 VITALS — BP 150/96 | HR 60 | Ht 70.5 in | Wt 212.7 lb

## 2013-02-20 DIAGNOSIS — I214 Non-ST elevation (NSTEMI) myocardial infarction: Secondary | ICD-10-CM

## 2013-02-20 DIAGNOSIS — I10 Essential (primary) hypertension: Secondary | ICD-10-CM

## 2013-02-20 DIAGNOSIS — G4733 Obstructive sleep apnea (adult) (pediatric): Secondary | ICD-10-CM | POA: Diagnosis not present

## 2013-02-20 DIAGNOSIS — I2510 Atherosclerotic heart disease of native coronary artery without angina pectoris: Secondary | ICD-10-CM | POA: Diagnosis not present

## 2013-02-20 DIAGNOSIS — E785 Hyperlipidemia, unspecified: Secondary | ICD-10-CM | POA: Diagnosis not present

## 2013-02-20 DIAGNOSIS — I252 Old myocardial infarction: Secondary | ICD-10-CM

## 2013-02-20 MED ORDER — LOSARTAN POTASSIUM 50 MG PO TABS: 50.0000 mg | ORAL_TABLET | Freq: Every day | ORAL | Status: DC

## 2013-02-20 NOTE — Patient Instructions (Signed)
Your physician recommends that you return for lab work in: 2 months.  Your physician recommends that you schedule a follow-up appointment in: 2 months.

## 2013-02-23 ENCOUNTER — Encounter: Payer: Self-pay | Admitting: Cardiovascular Disease

## 2013-02-23 NOTE — Progress Notes (Addendum)
Patient ID: Joshua Hahn, male   DOB: 26-Feb-1946, 67 y.o.   MRN: [id number]     HPI: Joshua Hahn, is a 67 y.o. male who presents for cardiology evaluation following his recent presentation with a non-ST segment elevation MI and two-vessel coronary intervention to the proximal midportion the vein graft to the LAD and focal in-stent restenosis in the previously placed stent in the circumflex vessel.  Joshua Hahn has established coronary artery disease dating back to 1980 which time I performed PTCA of his distal right coronary artery. 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  In t2010 he was admitted with unstable angina and was found to have significant native CAD with total occlusion of the LAD at its ostium, diffuse AV groove circumflex stenoses, 99% stenosis in a diffusely diseased a 1 vessel with 95% on 2 stenosis. The distal limb of the graft was opacified between OM1-2 but had subtotal occlusion. He had an occluded proximal limb of the graft from the aorta and consequently the entire circumflex territory was in jeopardy. He had native RCA occlusion as well as an occluded graft to the RCA. The RCA was collateralized from the circumflex vessel. He had a patent LIMA graft to the diagonal vessel  And patent vein graft to the LAD. On  08/23/2008 he underwent complex intervention to his native circumflex system utilizing high-speed rotational atherectomy was diffusely diseased native circumflex vessel ultimate insertion of a 2.5x28 mm Xience DES stent proximally and 2.25 Taxus stent more distally in tandem.  His last nuclear perfusion study  in January 13 remained low risk but did show previously noted fixed  basal to mid inferior scar. He has a history of prostate CA, status post laparoscopic radical prostatectomy.  Since I last saw Joshua Hahn in the office, he apparently developed recurrent episodes of chest pain symptoms which led to hospitalization on 02/04/2013. Catheterization revealed a 95% focal in-stent restenosis in the distal third of the stent in the circumflex vessel and he also had an 85-90% near ostial stenosis in the vein graft supplying the mid LAD. He was hydrated following his catheterization and on up to over 2 2014 underwent two-vessel intervention to the native circumflex vessel for his in-stent restenosis enteroscope/PTCA in the 95% stenosis reduced to 0%, and PTCA/stenting of the ostium in the vein graft supplying the LAD ultimate insertion of a 3.5x15 mm size expedition DES stent was dilated 3.6 mm. The stenosis was reduced to 0%. Subsequently, he has felt significantly improved. He has more energy. He denies shortness of breath with activity. He denies recurrent chest pain  Additional problems include hypertension, mixed hyperlipidemia, obstructive sleep apnea CPAP therapy, prostate cancer, status post radical prostatectomy laparoscopically performed by Joshua Hahn and left parotid gland surgery. He has been utilizing his CPAP therapy. Currently is not taking his ARB therapy since his procedure.  Past Medical History  Diagnosis Date  . Hypertension   . 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 levels LDL IIIa+b% and HDL2b%  . Coronary artery disease 1994    PCI'80s, CABG '94, CFX DES 4/10  . HTN (hypertension) 10/16/2011    Echo -EF 50-55% ,LV NORMAL  . Sleep  apnea, obstructive     using C-PAP  . Myocardial infarction 1994    infer[facility]    Past Surgical History  Procedure Laterality Date  . Coronary artery bypass graft  1994  . Cardiac catheterization  06/23/2008     occlusion sequential OM1 andOM2 graft as well vein graft to RCA.LAD and diagonal system remain intact. RCA  collateralized  . Percutaneous coronary stent intervention (pci-s)  06/25/2008    rotational atherectomy w/diffuse disease native circ. w/drug eluting stents , PTCA on one vessel; grat occlusion to RCA w/native RCA proxim. occlusion. Circ.supplied collaaterals distal RCA  following intervention  . Left parotid gland surgry  05/2009    Joshua Hahn  . Prostatectomy  06/2009    robotic-asssisted laparoscopic radical by Dr Joshua Hahn  . Exploratory laparotomy      ideopathic retroperitoneal fibrosis    No Known Allergies  Current Outpatient Prescriptions  Medication Sig Dispense Refill  . aspirin 81 MG tablet Take 81 mg by mouth daily.      . cholecalciferol (VITAMIN D) 1000 UNITS tablet Take 1,000 Units by mouth daily.      . Coenzyme Q10 (CO Q 10) 100 MG CAPS Take 300 mg by mouth every evening.       . isosorbide mononitrate (IMDUR) 60 MG 24 hr tablet Take 60 mg by mouth daily.      . metoprolol tartrate (LOPRESSOR) 25 MG tablet Take 1 tablet by mouth 2 (two) times daily.      . nitroGLYCERIN (NITROSTAT) 0.4 MG SL tablet Place 0.4 mg under the tongue every 5 (five) minutes as needed for chest pain.      Marland Kitchen omega-3 acid ethyl esters (LOVAZA) 1 G capsule Take 2 g by mouth 2 (two) times daily.      . ranolazine (RANEXA) 500 MG 12 hr tablet Take 1 tablet (500 mg total) by mouth 2 (two) times daily.  60 tablet  11  . rosuvastatin (CRESTOR) 40 MG tablet Take 40 mg by mouth daily.      . Ticagrelor (BRILINTA) 90 MG TABS tablet Take 1 tablet (90 mg total) by mouth 2 (two) times daily.  60 tablet  10  . vitamin B-12 (CYANOCOBALAMIN) 100 MCG tablet Take 100 mcg by mouth daily.       . vitamin C (ASCORBIC ACID) 500 MG tablet Take 1,000 mg by mouth daily.      Marland Kitchen losartan (COZAAR) 50 MG tablet Take 1 tablet (50 mg total) by mouth daily.  90 tablet  3   No current facility-administered medications for  this visit.    History   Social History  . Marital Status: Married    Spouse Name: N/A    Number of Children: 2  . Years of Education: N/A   Occupational History  . pastor    Social History Main Topics  . Smoking status: Never Smoker   . Smokeless tobacco: Never Used  . Alcohol Use: No  . Drug Use: Not on file  . Sexual Activity: Not on file   Other Topics Concern  . Not on file   Social History Narrative  . No narrative on file   Socially he is married. He has 2 children 5 grandchildren. 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 There is no tobacco use. He  does travel. He was planning to go to [place] on a missionary trip that this will be deferred presently.  Family History  Problem Relation Age of Onset  . Coronary artery disease Brother 109    2 bros with early CAD    ROS is negative for fevers, chills or night sweats. he denies presyncope or syncope. He denies visual symptoms. He denies cough or congestion. He denies wheezing. He denies recurrent chest pain or shortness of breath since his two-vessel coronary intervention. He has been off his ARB therapy.  He denies nausea vomiting or diarrhea. He denies change in bowel or bladder habits. He denies hematuria or hematochezia. He denies bleeding. He denies significant. He denies myalgias. He utilizing CPAP therapy.   Other comments of 12 point system review is negative.  PE BP 150/96  Pulse 60  Ht 5' 10.5" (1.791 m)  Wt 212 lb 11.2 oz (96.48 kg)  BMI 30.08 kg/m2  General: Alert, oriented, no distress.  Skin: normal turgor, no rashes HEENT: Normocephalic, atraumatic. Pupils round and reactive; sclera anicteric;no lid lag.  Nose without nasal septal hypertrophy Mouth/Parynx benign; Mallinpatti scale 3 Neck: No JVD, no carotid briuts Lungs: clear to ausculatation and percussion; no wheezing or  rales Heart: RRR, s1 s2 normal  1-2/6 systolic murmur  Abdomen: soft, nontender; no hepatosplenomehaly, BS+; abdominal aorta nontender and not dilated by palpation. Pulses 2+ groin catheterization site stable. No ecchymoses or hematoma. Extremities: no clubbing cyanosis or edema, Homan's sign negative  Neurologic: grossly nonfocal Psychological: Normal affect and mood.  ECG: Sinus rhythm at 60 beats per minute. Previously old Q wave in 3 small Q wave in F. Nondiagnostic ST changes   LABS:  BMET    Component Value Date/Time   NA 134* 02/07/2013 0440   K 4.1 02/07/2013 0440   CL 99 02/07/2013 0440   CO2 25 02/07/2013 0440   GLUCOSE 114* 02/07/2013 0440   GLUCOSE 97 03/04/2006 0946   BUN 10 02/07/2013 0440   CREATININE 1.55* 02/07/2013 0440   CREATININE 1.44* 01/09/2013 0929   CALCIUM 9.5 02/07/2013 0440   GFRNONAA 44* 02/07/2013 0440   GFRAA 51* 02/07/2013 0440     Hepatic Function Panel     Component Value Date/Time   PROT 7.0 01/09/2013 0929   ALBUMIN 4.2 01/09/2013 0929   AST 22 01/09/2013 0929   ALT 19 01/09/2013 0929   ALKPHOS 56 01/09/2013 0929   BILITOT 0.7 01/09/2013 0929   BILIDIR 0.0 02/27/2012 1718     CBC    Component Value Date/Time   WBC 9.3 02/07/2013 0440   WBC 7.8 11/17/2012 1106   RBC 4.80 02/07/2013 0440   RBC 5.05 11/17/2012 1106   HGB 15.0 02/07/2013 0440   HGB 15.3 11/17/2012 1106   HCT 42.4 02/07/2013 0440   HCT 48.9 11/17/2012 1106   PLT 181 02/07/2013 0440   MCV 88.3 02/07/2013 0440   MCV 96.8 11/17/2012 1106   MCH 31.3 02/07/2013 0440   MCH 30.3 11/17/2012 1106   MCHC 35.4 02/07/2013 0440   MCHC 31.3* 11/17/2012 1106   RDW 12.9 02/07/2013 0440   LYMPHSABS 2.5 02/27/2012 1718   MONOABS 0.7 02/27/2012 1718   EOSABS 0.1 02/27/2012 1718   BASOSABS 0.0 02/27/2012 1718     BNP    Component Value Date/Time   PROBNP 310.8* 02/04/2013 0816    Lipid Panel     Component Value Date/Time   CHOL 205* 02/27/2012 1718   TRIG 289* 01/09/2013 1610  TRIG 230.0* 02/27/2012  1718   HDL 39.40 02/27/2012 1718   CHOLHDL 5 02/27/2012 1718   VLDL 46.0* 02/27/2012 1718   LDLCALC 170* 01/09/2013 0926   LDLCALC  Value: UNABLE TO CALCULATE IF TRIGLYCERIDE OVER 400 mg/dL        Total Cholesterol/HDL:CHD Risk Coronary Heart Disease Risk Table                     Men   Women  1/2 Average Risk   3.4   3.3  Average Risk       5.0   4.4  2 X Average Risk   9.6   7.1  3 X Average Risk  23.4   11.0        Use the calculated Patient Ratio above and the CHD Risk Table to determine the patient's CHD Risk.        ATP III CLASSIFICATION (LDL):  <100     mg/dL   Optimal  409-811  mg/dL   Near or Above                    Optimal  130-159  mg/dL   Borderline  914-782  mg/dL   High  >956     mg/dL   Very High 06/20/863 7846     RADIOLOGY: No results found.    ASSESSMENT AND PLAN: Rev. Schwenke is now 26 years following his initial PTCA of his distal right coronary artery in 20 years following his inferior wall myocardial infarction and subsequent CABG revascularization surgery. He is 4 years following complex rotational atherectomy and diffuse stenting of his native circumflex vessel which also supplied his RCA territory. He recently developed unstable anginal symptoms and had mild positive troponin ruling in for non-ST segment MI. Catheterization done 02/04/2013 showed focal 95% in-stent restenosis in the distal third of the previously placed stent in the circumflex vessel. This stenosis occurred after the collateral was aeration of the circumflex supplying the distal right coronary artery. He also had high grade 85-90% near ostial stenosis in the vein graft supplying the LAD. Fortunately he underwent successful two-vessel coronary intervention with cutting balloon into scope to the circumflex in-stent restenosis, and PTCA/insertion of a new DES stent in the ostium of the vein graft supplying the LAD. He has noticed marked improvement in previous symptomatology. He has more energy. He denies  recurrent chest pain or shortness of breath. His blood pressure today was elevated although on repeat was 130/90. I recommend resumption of losartan 50 mg. In the past he had been on Diovan but due to cost this was switched. He now is on Crestor 40 mg for his LDL which is significantly elevated on last check. Again discussed importance of continued CPAP therapy use. In 2 months he will undergo repeat laboratory including a cemented, CBC as well as an NMR lipoprofile and I will see her for followup evaluation. I recommended that he cancel his trip to [place] which was tentatively scheduled for November.     Lennette Bihari, MD, [facility]  02/23/2013 3:09 PM

## 2013-02-24 ENCOUNTER — Encounter: Payer: Self-pay | Admitting: Cardiovascular Disease

## 2013-03-02 ENCOUNTER — Telehealth: Payer: Self-pay | Admitting: *Deleted

## 2013-03-02 NOTE — Telephone Encounter (Signed)
Faxed signed cardiac rehab prescription back to [facility].

## 2013-03-13 ENCOUNTER — Telehealth: Payer: Self-pay | Admitting: *Deleted

## 2013-03-13 NOTE — Telephone Encounter (Signed)
Faxed supply order back to [facility].

## 2013-03-23 DIAGNOSIS — H43819 Vitreous degeneration, unspecified eye: Secondary | ICD-10-CM | POA: Diagnosis not present

## 2013-03-23 DIAGNOSIS — H02839 Dermatochalasis of unspecified eye, unspecified eyelid: Secondary | ICD-10-CM | POA: Diagnosis not present

## 2013-03-23 DIAGNOSIS — H3509 Other intraretinal microvascular abnormalities: Secondary | ICD-10-CM | POA: Diagnosis not present

## 2013-03-23 DIAGNOSIS — H524 Presbyopia: Secondary | ICD-10-CM | POA: Diagnosis not present

## 2013-03-23 DIAGNOSIS — H40029 Open angle with borderline findings, high risk, unspecified eye: Secondary | ICD-10-CM | POA: Diagnosis not present

## 2013-03-23 DIAGNOSIS — H2510 Age-related nuclear cataract, unspecified eye: Secondary | ICD-10-CM | POA: Diagnosis not present

## 2013-03-23 DIAGNOSIS — H35039 Hypertensive retinopathy, unspecified eye: Secondary | ICD-10-CM | POA: Diagnosis not present

## 2013-03-26 ENCOUNTER — Encounter (HOSPITAL_COMMUNITY)
Admission: RE | Admit: 2013-03-26 | Discharge: 2013-03-26 | Disposition: A | Discharge status: Home / Self Care | Payer: Medicare Other | Source: Ambulatory Visit | Attending: Cardiovascular Disease | Admitting: Cardiovascular Disease

## 2013-03-26 DIAGNOSIS — Z9861 Coronary angioplasty status: Secondary | ICD-10-CM | POA: Insufficient documentation

## 2013-03-26 DIAGNOSIS — I214 Non-ST elevation (NSTEMI) myocardial infarction: Secondary | ICD-10-CM | POA: Insufficient documentation

## 2013-03-26 DIAGNOSIS — Z5189 Encounter for other specified aftercare: Secondary | ICD-10-CM | POA: Insufficient documentation

## 2013-03-26 NOTE — Progress Notes (Signed)
Cardiac Rehab Medication Review by a Pharmacist  Does the patient  feel that his/her medications are working for him/her?  yes  Has the patient been experiencing any side effects to the medications prescribed?  no  Does the patient measure his/her own blood pressure or blood glucose at home?  no   Does the patient have any problems obtaining medications due to transportation or finances?   no  Understanding of regimen: good Understanding of indications: good Potential of compliance: excellent    Pharmacist comments:  67 yo male presents in very good spirits this morning. Does not have a list of medications with him, but was able to correct medication sheet. Currently has not been taking losartan since it was discontinued during his inpatient stay, BP today 132/78. No acute complaints of issues. Patient has a good potential for compliance. I have updated all of his medications and allergies.  Thank you for allowing me to take part in the care of this patient,  [name] B. Artelia Laroche, PharmD Clinical Pharmacist - Resident Pager: [phone] Phone: [phone] 03/26/2013 9:10 AM

## 2013-03-30 ENCOUNTER — Encounter (HOSPITAL_COMMUNITY): Payer: Self-pay

## 2013-03-30 ENCOUNTER — Encounter (HOSPITAL_COMMUNITY)
Admission: RE | Admit: 2013-03-30 | Discharge: 2013-03-30 | Disposition: A | Discharge status: Home / Self Care | Payer: Medicare Other | Source: Ambulatory Visit | Attending: Cardiovascular Disease | Admitting: Cardiovascular Disease

## 2013-03-30 DIAGNOSIS — Z9861 Coronary angioplasty status: Secondary | ICD-10-CM | POA: Diagnosis not present

## 2013-03-30 DIAGNOSIS — Z5189 Encounter for other specified aftercare: Secondary | ICD-10-CM | POA: Diagnosis not present

## 2013-03-30 DIAGNOSIS — I214 Non-ST elevation (NSTEMI) myocardial infarction: Secondary | ICD-10-CM | POA: Diagnosis not present

## 2013-03-30 NOTE — Progress Notes (Signed)
Pt started cardiac rehab today.  Pt tolerated light exercise without difficulty. Asymptomatic.  VSS, telemetry-NSR.  PHQ-0. No psychosocial barriers to rehab participation.  Pt oriented to exercise equipment and routine.  Understanding verbalized.  Will continue to monitor the patient throughout  the program.

## 2013-04-01 ENCOUNTER — Encounter (HOSPITAL_COMMUNITY)
Admission: RE | Admit: 2013-04-01 | Discharge: 2013-04-01 | Disposition: A | Discharge status: Home / Self Care | Payer: Medicare Other | Source: Ambulatory Visit | Attending: Cardiovascular Disease | Admitting: Cardiovascular Disease

## 2013-04-06 ENCOUNTER — Encounter (HOSPITAL_COMMUNITY)
Admission: RE | Admit: 2013-04-06 | Discharge: 2013-04-06 | Disposition: A | Discharge status: Home / Self Care | Payer: Medicare Other | Source: Ambulatory Visit | Attending: Cardiovascular Disease | Admitting: Cardiovascular Disease

## 2013-04-06 DIAGNOSIS — Z9861 Coronary angioplasty status: Secondary | ICD-10-CM | POA: Diagnosis not present

## 2013-04-06 DIAGNOSIS — I214 Non-ST elevation (NSTEMI) myocardial infarction: Secondary | ICD-10-CM | POA: Diagnosis not present

## 2013-04-06 DIAGNOSIS — Z5189 Encounter for other specified aftercare: Secondary | ICD-10-CM | POA: Insufficient documentation

## 2013-04-08 ENCOUNTER — Encounter (HOSPITAL_COMMUNITY)
Admission: RE | Admit: 2013-04-08 | Discharge: 2013-04-08 | Disposition: A | Discharge status: Home / Self Care | Payer: Medicare Other | Source: Ambulatory Visit | Attending: Cardiovascular Disease | Admitting: Cardiovascular Disease

## 2013-04-10 ENCOUNTER — Encounter (HOSPITAL_COMMUNITY): Payer: Medicare Other

## 2013-04-13 ENCOUNTER — Encounter (HOSPITAL_COMMUNITY)
Admission: RE | Admit: 2013-04-13 | Discharge: 2013-04-13 | Disposition: A | Discharge status: Home / Self Care | Payer: Medicare Other | Source: Ambulatory Visit | Attending: Cardiovascular Disease | Admitting: Cardiovascular Disease

## 2013-04-13 ENCOUNTER — Other Ambulatory Visit: Payer: Self-pay | Admitting: Cardiovascular Disease

## 2013-04-13 DIAGNOSIS — Z79899 Other long term (current) drug therapy: Secondary | ICD-10-CM | POA: Diagnosis not present

## 2013-04-13 DIAGNOSIS — E785 Hyperlipidemia, unspecified: Secondary | ICD-10-CM | POA: Diagnosis not present

## 2013-04-13 DIAGNOSIS — I2510 Atherosclerotic heart disease of native coronary artery without angina pectoris: Secondary | ICD-10-CM | POA: Diagnosis not present

## 2013-04-13 LAB — CBC
HCT: 43.9 % (ref 39.0–52.0)
Platelets: 205 10*3/uL (ref 150–400)
RBC: 4.95 MIL/uL (ref 4.22–5.81)
RDW: 13.8 % (ref 11.5–15.5)
WBC: 8.2 10*3/uL (ref 4.0–10.5)

## 2013-04-14 LAB — NMR LIPOPROFILE WITH LIPIDS
HDL Size: 8.5 nm — ABNORMAL LOW (ref >=9.2)
LDL (calc): 91 mg/dL (ref <100)
LDL Size: 20.6 nm (ref >20.5)
LP-IR Score: 64 — ABNORMAL HIGH (ref <=45)
Large HDL-P: 1.3 umol/L — ABNORMAL LOW (ref >=4.8)
Large VLDL-P: 2.1 nmol/L (ref <=2.7)
Small LDL Particle Number: 859 nmol/L — ABNORMAL HIGH (ref <=527)
VLDL Size: 44.9 nm (ref <=46.6)

## 2013-04-14 LAB — COMPREHENSIVE METABOLIC PANEL
ALT: 20 U/L (ref 0–53)
AST: 24 U/L (ref 0–37)
Albumin: 4.4 g/dL (ref 3.5–5.2)
CO2: 27 mEq/L (ref 19–32)
Calcium: 10.2 mg/dL (ref 8.4–10.5)
Chloride: 99 mEq/L (ref 96–112)
Potassium: 4.7 mEq/L (ref 3.5–5.3)

## 2013-04-15 ENCOUNTER — Ambulatory Visit (INDEPENDENT_AMBULATORY_CARE_PROVIDER_SITE_OTHER): Payer: Medicare Other | Admitting: Cardiovascular Disease

## 2013-04-15 ENCOUNTER — Encounter (HOSPITAL_COMMUNITY)
Admission: RE | Admit: 2013-04-15 | Discharge: 2013-04-15 | Disposition: A | Discharge status: Home / Self Care | Payer: Medicare Other | Source: Ambulatory Visit | Attending: Cardiovascular Disease | Admitting: Cardiovascular Disease

## 2013-04-15 ENCOUNTER — Encounter: Payer: Self-pay | Admitting: Cardiovascular Disease

## 2013-04-15 VITALS — BP 140/98 | Ht 71.0 in | Wt 215.2 lb

## 2013-04-15 DIAGNOSIS — G4733 Obstructive sleep apnea (adult) (pediatric): Secondary | ICD-10-CM

## 2013-04-15 DIAGNOSIS — N183 Chronic kidney disease, stage 3 unspecified: Secondary | ICD-10-CM

## 2013-04-15 DIAGNOSIS — I2510 Atherosclerotic heart disease of native coronary artery without angina pectoris: Secondary | ICD-10-CM | POA: Diagnosis not present

## 2013-04-15 DIAGNOSIS — I214 Non-ST elevation (NSTEMI) myocardial infarction: Secondary | ICD-10-CM

## 2013-04-15 DIAGNOSIS — E785 Hyperlipidemia, unspecified: Secondary | ICD-10-CM | POA: Diagnosis not present

## 2013-04-15 DIAGNOSIS — I10 Essential (primary) hypertension: Secondary | ICD-10-CM | POA: Diagnosis not present

## 2013-04-15 NOTE — Patient Instructions (Signed)
Your physician recommends that you schedule a follow-up appointment in: 6 months. No changes were made today in your therapy. 

## 2013-04-15 NOTE — Progress Notes (Signed)
Joshua Hahn 67 y.o. male Nutrition Note Spoke with pt. Pt well-known to this Clinical research associate from previous admission.  Nutrition Plan and Nutrition Survey goals reviewed with pt. Pt is following Step 2 of the Therapeutic Lifestyle Changes diet. Pt wants to lose wt. Pt feels he needs to reduce his portion sizes and dessert consumption to lose wt. Wt loss tips reviewed. Pt's 2011 A1c discussed. Pt encouraged to talk to his PCP re: previously elevated A1c to determine if further DM testing is needed. Pt reports a family history of DM (his mother). Pt expressed understanding of the information reviewed. Pt aware of nutrition education classes offered and pt has previously attended nutrition classes.  Nutrition Diagnosis   Food-and nutrition-related knowledge deficit related to lack of exposure to information as related to diagnosis of: ? CVD ? Pre-DM (A1c 6.4 on 10/23/09) ?    Obesity related to excessive energy intake as evidenced by a BMI of 31.4  Nutrition RX/ Estimated Daily Nutrition Needs for: wt loss  1600-2100 Kcal, 40-55 gm fat, 10-14 gm sat fat, 1.5-2.1 gm trans-fat, <1500 mg sodium   Nutrition Intervention   Pt's individual nutrition plan including cholesterol goals reviewed with pt.   Benefits of adopting Therapeutic Lifestyle Changes discussed when Medficts reviewed.   Pt to attend the Portion Distortion class   Pt to attend the  ? Nutrition I class - met during previous admission                     ? Nutrition II class - met during previous admission   Continue client-centered nutrition education by RD, as part of interdisciplinary care.  Goal(s)   Pt to identify food quantities necessary to achieve: ? wt loss to a goal wt of 188-206 lb (85.7-93.9 kg) at graduation from cardiac rehab.   Monitor and Evaluate progress toward nutrition goal with team. Nutrition Risk:  Low   Mickle Plumb, M.Ed, RD, LDN, CDE 04/15/2013 9:38 AM

## 2013-04-17 ENCOUNTER — Encounter (HOSPITAL_COMMUNITY)
Admission: RE | Admit: 2013-04-17 | Discharge: 2013-04-17 | Disposition: A | Discharge status: Home / Self Care | Payer: Medicare Other | Source: Ambulatory Visit | Attending: Cardiovascular Disease | Admitting: Cardiovascular Disease

## 2013-04-20 ENCOUNTER — Encounter (HOSPITAL_COMMUNITY)
Admission: RE | Admit: 2013-04-20 | Discharge: 2013-04-20 | Disposition: A | Discharge status: Home / Self Care | Payer: Medicare Other | Source: Ambulatory Visit | Attending: Cardiovascular Disease | Admitting: Cardiovascular Disease

## 2013-04-22 ENCOUNTER — Encounter (HOSPITAL_COMMUNITY)
Admission: RE | Admit: 2013-04-22 | Discharge: 2013-04-22 | Disposition: A | Discharge status: Home / Self Care | Payer: Medicare Other | Source: Ambulatory Visit | Attending: Cardiovascular Disease | Admitting: Cardiovascular Disease

## 2013-04-24 ENCOUNTER — Encounter (HOSPITAL_COMMUNITY)
Admission: RE | Admit: 2013-04-24 | Discharge: 2013-04-24 | Disposition: A | Discharge status: Home / Self Care | Payer: Medicare Other | Source: Ambulatory Visit | Attending: Cardiovascular Disease | Admitting: Cardiovascular Disease

## 2013-04-27 ENCOUNTER — Encounter (HOSPITAL_COMMUNITY)
Admission: RE | Admit: 2013-04-27 | Discharge: 2013-04-27 | Disposition: A | Discharge status: Home / Self Care | Payer: Medicare Other | Source: Ambulatory Visit | Attending: Cardiovascular Disease | Admitting: Cardiovascular Disease

## 2013-04-27 ENCOUNTER — Encounter: Payer: Self-pay | Admitting: Cardiovascular Disease

## 2013-04-27 NOTE — Progress Notes (Signed)
Patient ID: Joshua Hahn, male   DOB: [birth year], 67 y.o.   MRN: [id number]      HPI: Joshua Hahn, is a 67 y.o. male who presents for 2 month cardiology evaluation.  I had last seen him on 02/20/13 after following his recent non-ST segment elevation MI and two-vessel coronary intervention to the proximal midportion the vein graft to the LAD and focal in-stent restenosis in the previously placed stent in the circumflex vessel.  Rev. Rosner has established coronary artery disease dating back to 1980 which time I performed PTCA of his distal right coronary artery. 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  In t2010 he was admitted with unstable angina and was found to have significant native CAD with total occlusion of the LAD at its ostium, diffuse AV groove circumflex stenoses, 99% stenosis in a diffusely diseased a 1 vessel with 95% on 2 stenosis. The distal limb of the graft was opacified between OM1-2 but had subtotal occlusion. He had an occluded proximal limb of the graft from the aorta and consequently the entire circumflex territory was in jeopardy. He had native RCA occlusion as well as an occluded graft to the RCA. The RCA was collateralized from the circumflex vessel. He had a patent LIMA graft to the diagonal vessel  And patent vein graft to the LAD. On  08/23/2008 he underwent complex intervention to his native circumflex system utilizing high-speed rotational atherectomy was diffusely diseased native circumflex vessel ultimate insertion of a 2.5x28 mm Xience DES stent proximally and 2.25 Taxus stent more distally in tandem.  His last nuclear perfusion study  in January 13 remained low risk but did  show previously noted fixed basal to mid inferior scar. He has a history of prostate CA, status post laparoscopic radical prostatectomy.  Rev. Poupard developed recurrent episodes of chest pain symptoms which led to hospitalization on 02/04/2013. Catheterization revealed a 95% focal in-stent restenosis in the distal third of the stent in the circumflex vessel and he also had an 85-90% near ostial stenosis in the vein graft supplying the mid LAD. He was hydrated following his catheterization and on up to over 2 2014 underwent two-vessel intervention to the native circumflex vessel for his in-stent restenosis enteroscope/PTCA in the 95% stenosis reduced to 0%, and PTCA/stenting of the ostium in the vein graft supplying the LAD ultimate insertion of a 3.5x15 mm size expedition DES stent was dilated 3.6 mm. The stenosis was reduced to 0%. Subsequently, he has felt significantly improved. He has more energy. He denies shortness of breath with activity. He denies recurrent chest pain  Additional problems include hypertension, mixed hyperlipidemia, obstructive sleep apnea CPAP therapy, prostate cancer, status post radical prostatectomy laparoscopically performed by Dr. Earlene Plater and left parotid gland surgery. I last saw him I stressed the importance that he continue to use his CPAP therapy with 100% compliant. I also reinitiated ARB therapy with losartan, although the past he had been on Diovan but due to cost issues his insurance company would no longer cover this.   Laboratory done 2 days ago showed marked improvement in his NMRprofile with previous LDL particle #2733 now being reduced to 1491. Calculated LDL cholesterol was reduced from 170 to 91. Triglycerides  although improved from 289 to 215 were still elevated. Small particles were markedly reduced but still elevated at 859, where previously these had been 1823. Insulin resistance were still elevated at 64.  Past Medical History  Diagnosis Date  . Hypertension    . 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 levels LDL IIIa+b% and HDL2b%  . Coronary artery disease 1994    PCI'80s, CABG '94, CFX DES 4/10  . HTN (hypertension) 10/16/2011    Echo -EF 50-55% ,LV NORMAL  . Sleep apnea, obstructive     using C-PAP  . Myocardial infarction 1994    infer[facility] wall MI [facility]    Past Surgical History  Procedure Laterality Date  . Coronary artery bypass graft  1994  . Cardiac catheterization  06/23/2008    occlusion sequential OM1 andOM2 graft as well vein graft to RCA.LAD and diagonal system remain intact. RCA  collateralized  . Percutaneous coronary stent intervention (pci-s)  06/25/2008    rotational atherectomy w/diffuse disease native circ. w/drug eluting stents , PTCA on one vessel; grat occlusion to RCA w/native RCA proxim. occlusion. Circ.supplied collaaterals distal RCA  following intervention  . Left parotid gland surgry  05/2009    Dr. Lazarus Salines  . Prostatectomy  06/2009    robotic-asssisted laparoscopic radical by Dr Earlene Plater  . Exploratory laparotomy      ideopathic retroperitoneal fibrosis    No Known Allergies  Current Outpatient Prescriptions  Medication Sig Dispense Refill  . aspirin 81 MG tablet Take 81 mg by mouth daily.      . cholecalciferol (VITAMIN D) 1000 UNITS tablet Take 1,000 Units by mouth daily.      . Coenzyme Q10 (CO Q 10) 100 MG CAPS Take 300 mg by mouth every evening.       Marland Kitchen losartan (COZAAR) 50 MG tablet Take 1 tablet (50 mg total) by mouth daily.  90 tablet  3  . metoprolol tartrate (LOPRESSOR) 25 MG tablet Take 1 tablet by mouth 2 (two) times daily.      . nitroGLYCERIN (NITROSTAT) 0.4 MG SL tablet Place 0.4 mg under the tongue every 5 (five) minutes as needed for chest pain.      Marland Kitchen omega-3 acid ethyl esters (LOVAZA) 1 G capsule Take 2 g by mouth 2 (two) times daily.      . ranolazine (RANEXA) 500 MG 12 hr tablet Take 1  tablet (500 mg total) by mouth 2 (two) times daily.  60 tablet  11  . rosuvastatin (CRESTOR) 40 MG tablet Take 40 mg by mouth daily.      . Ticagrelor (BRILINTA) 90 MG TABS tablet Take 1 tablet (90 mg total) by mouth 2 (two) times daily.  60 tablet  10  . vitamin B-12 (CYANOCOBALAMIN) 100 MCG tablet Take 100 mcg by mouth daily.       . vitamin C (ASCORBIC ACID) 500 MG tablet Take 1,000 mg by mouth daily.      . isosorbide mononitrate (IMDUR) 60 MG 24 hr tablet Take 60 mg by mouth daily.       No current facility-administered medications for this visit.    History   Social History  . Marital Status: Married    Spouse Name: N/A    Number of Children: 2  . Years of Education: N/A   Occupational History  . pastor    Social History Main Topics  . Smoking status: Never Smoker   .  Smokeless tobacco: Never Used  . Alcohol Use: No  . Drug Use: Not on file  . Sexual Activity: Not on file   Other Topics Concern  . Not on file   Social History Narrative  . No narrative on file   Socially he is married. He has 2 children 5 grandchildren. 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 There is no tobacco use. He does travel. He was planning to go to [place] on a missionary trip that this will be deferred presently.  Family History  Problem Relation Age of Onset  . Coronary artery disease Brother 44    2 bros with early CAD    ROS is negative for fevers, chills or night sweats. he denies presyncope or syncope. He denies visual symptoms. He denies change in hearing He denies cough or congestion. He is unaware lymphadenopathy. He denies wheezing. He denies recurrent chest pain or shortness of breath since his two-vessel coronary intervention.   He denies nausea vomiting or diarrhea. He denies change in bowel or bladder habits. He denies hematuria or hematochezia. He  denies bleeding. He denies significant. He denies myalgias. He is utilizing CPAP therapy. He is unaware breakthrough snoring. He denies residual daytime sleepiness. He admits to being more alert during the day since resumption of CPAP with 100% compliance. There is no overt diabetes. He denies thyroid abnormalities. He denies musculoskeletal issues presently, although has had some back discomfort in the past.   Other comprehensive 14 point system review is negative.  PE BP 140/98  Ht 5\' 11"  (1.803 m)  Wt 215 lb 3.2 oz (97.614 kg)  BMI 30.03 kg/m2  General: Alert, oriented, no distress.  Skin: normal turgor, no rashes HEENT: Normocephalic, atraumatic. Pupils round and reactive; sclera anicteric;no lid lag.  Nose without nasal septal hypertrophy Mouth/Parynx benign; Mallinpatti scale 3 Neck: No JVD, no carotid briuts Lungs: clear to ausculatation and percussion; no wheezing or rales Heart: RRR, s1 s2 normal  1-2/6 systolic murmur  Abdomen: soft, nontender; no hepatosplenomehaly, BS+; abdominal aorta nontender and not dilated by palpation. Pulses 2+ groin catheterization site stable. No ecchymoses or hematoma. Extremities: no clubbing cyanosis or edema, Homan's sign negative  Neurologic: grossly nonfocal Psychological: Normal affect and mood.  ECG: Sinus rhythm at 61 beats per minute. Previously old Q wave in 3. Nondiagnostic ST changes   LABS:  BMET    Component Value Date/Time   NA 136 04/13/2013 1505   K 4.7 04/13/2013 1505   CL 99 04/13/2013 1505   CO2 27 04/13/2013 1505   GLUCOSE 84 04/13/2013 1505   GLUCOSE 97 03/04/2006 0946   BUN 10 04/13/2013 1505   CREATININE 1.40* 04/13/2013 1505   CREATININE 1.55* 02/07/2013 0440   CALCIUM 10.2 04/13/2013 1505   GFRNONAA 44* 02/07/2013 0440   GFRAA 51* 02/07/2013 0440     Hepatic Function Panel     Component Value Date/Time   PROT 7.5 04/13/2013 1505   ALBUMIN 4.4 04/13/2013 1505   AST 24 04/13/2013 1505   ALT 20 04/13/2013 1505   ALKPHOS  60 04/13/2013 1505   BILITOT 0.9 04/13/2013 1505   BILIDIR 0.0 02/27/2012 1718     CBC    Component Value Date/Time   WBC 8.2 04/13/2013 1505   WBC 7.8 11/17/2012 1106   RBC 4.95 04/13/2013 1505   RBC 5.05 11/17/2012 1106  HGB 15.0 04/13/2013 1505   HGB 15.3 11/17/2012 1106   HCT 43.9 04/13/2013 1505   HCT 48.9 11/17/2012 1106   PLT 205 04/13/2013 1505   MCV 88.7 04/13/2013 1505   MCV 96.8 11/17/2012 1106   MCH 30.3 04/13/2013 1505   MCH 30.3 11/17/2012 1106   MCHC 34.2 04/13/2013 1505   MCHC 31.3* 11/17/2012 1106   RDW 13.8 04/13/2013 1505   LYMPHSABS 2.5 02/27/2012 1718   MONOABS 0.7 02/27/2012 1718   EOSABS 0.1 02/27/2012 1718   BASOSABS 0.0 02/27/2012 1718     BNP    Component Value Date/Time   PROBNP 310.8* 02/04/2013 0816    Lipid Panel     Component Value Date/Time   CHOL 205* 02/27/2012 1718   TRIG 215* 04/13/2013 1505   TRIG 230.0* 02/27/2012 1718   HDL 39.40 02/27/2012 1718   CHOLHDL 5 02/27/2012 1718   VLDL 46.0* 02/27/2012 1718   LDLCALC 91 04/13/2013 1505   LDLCALC  Value: UNABLE TO CALCULATE IF TRIGLYCERIDE OVER 400 mg/dL        Total Cholesterol/HDL:CHD Risk Coronary Heart Disease Risk Table                     Men   Women  1/2 Average Risk   3.4   3.3  Average Risk       5.0   4.4  2 X Average Risk   9.6   7.1  3 X Average Risk  23.4   11.0        Use the calculated Patient Ratio above and the CHD Risk Table to determine the patient's CHD Risk.        ATP III CLASSIFICATION (LDL):  <100     mg/dL   Optimal  629-528  mg/dL   Near or Above                    Optimal  130-159  mg/dL   Borderline  413-244  mg/dL   High  >010     mg/dL   Very High 2/72/5366 4403     RADIOLOGY: No results found.    ASSESSMENT AND PLAN: Rev. Simkin is now 26 years following his initial PTCA of his distal right coronary artery in 20 years following his inferior wall myocardial infarction and subsequent CABG revascularization surgery. He is 4 years following complex rotational atherectomy  and diffuse stenting of his native circumflex vessel which also supplied his RCA territory. He recently developed unstable anginal symptoms and had mild positive troponin ruling in for non-ST segment MI. Catheterization done 02/04/2013 showed focal 95% in-stent restenosis in the distal third of the previously placed stent in the circumflex vessel. This stenosis occurred after the collateral was aeration of the circumflex supplying the distal right coronary artery. He also had high grade 85-90% near ostial stenosis in the vein graft supplying the LAD. Fortunately he underwent successful two-vessel coronary intervention with cutting balloon into scope to the circumflex in-stent restenosis, and PTCA/insertion of a new DES stent in the ostium of the vein graft supplying the LAD. He has noticed marked improvement in previous symptomatology. He has more energy. He denies recurrent chest pain or shortness of breath. His blood pressure today is improved with resumption of ARB therapy. His lipid panel is markedly improved although he still has an increased  LDL particle number at 1491. He states he is eating significantly improved diet from previously. He would try to further aggressively  alter his diet and increase exercise. Zetia may need to be added to his regimen for optimal LDL particle number. I will see him in 6 months for cardiology reevaluation and further recommendations were made at that time.    Lennette Bihari, MD, [facility]  04/27/2013 11:10 AM

## 2013-04-29 ENCOUNTER — Encounter (HOSPITAL_COMMUNITY)
Admission: RE | Admit: 2013-04-29 | Discharge: 2013-04-29 | Disposition: A | Discharge status: Home / Self Care | Payer: Medicare Other | Source: Ambulatory Visit | Attending: Cardiovascular Disease | Admitting: Cardiovascular Disease

## 2013-05-04 ENCOUNTER — Encounter (HOSPITAL_COMMUNITY)
Admission: RE | Admit: 2013-05-04 | Discharge: 2013-05-04 | Disposition: A | Discharge status: Home / Self Care | Payer: Medicare Other | Source: Ambulatory Visit | Attending: Cardiovascular Disease | Admitting: Cardiovascular Disease

## 2013-05-06 ENCOUNTER — Encounter (HOSPITAL_COMMUNITY)
Admission: RE | Admit: 2013-05-06 | Discharge: 2013-05-06 | Disposition: A | Discharge status: Home / Self Care | Payer: Medicare Other | Source: Ambulatory Visit | Attending: Cardiovascular Disease | Admitting: Cardiovascular Disease

## 2013-05-08 ENCOUNTER — Encounter (HOSPITAL_COMMUNITY)
Admission: RE | Admit: 2013-05-08 | Discharge: 2013-05-08 | Disposition: A | Discharge status: Home / Self Care | Payer: Medicare Other | Source: Ambulatory Visit | Attending: Cardiovascular Disease | Admitting: Cardiovascular Disease

## 2013-05-08 DIAGNOSIS — Z9861 Coronary angioplasty status: Secondary | ICD-10-CM | POA: Insufficient documentation

## 2013-05-08 DIAGNOSIS — I214 Non-ST elevation (NSTEMI) myocardial infarction: Secondary | ICD-10-CM | POA: Insufficient documentation

## 2013-05-08 DIAGNOSIS — Z5189 Encounter for other specified aftercare: Secondary | ICD-10-CM | POA: Insufficient documentation

## 2013-05-11 ENCOUNTER — Encounter (HOSPITAL_COMMUNITY): Payer: Medicare Other

## 2013-05-13 ENCOUNTER — Encounter (HOSPITAL_COMMUNITY): Payer: Medicare Other

## 2013-05-13 ENCOUNTER — Telehealth (HOSPITAL_COMMUNITY): Payer: Self-pay | Admitting: Internal Medicine

## 2013-05-15 ENCOUNTER — Encounter (HOSPITAL_COMMUNITY)
Admission: RE | Admit: 2013-05-15 | Discharge: 2013-05-15 | Disposition: A | Discharge status: Home / Self Care | Payer: Medicare Other | Source: Ambulatory Visit | Attending: Cardiovascular Disease | Admitting: Cardiovascular Disease

## 2013-05-18 ENCOUNTER — Encounter (HOSPITAL_COMMUNITY)
Admission: RE | Admit: 2013-05-18 | Discharge: 2013-05-18 | Disposition: A | Discharge status: Home / Self Care | Payer: Medicare Other | Source: Ambulatory Visit | Attending: Cardiovascular Disease | Admitting: Cardiovascular Disease

## 2013-05-20 ENCOUNTER — Encounter (HOSPITAL_COMMUNITY)
Admission: RE | Admit: 2013-05-20 | Discharge: 2013-05-20 | Disposition: A | Discharge status: Home / Self Care | Payer: Medicare Other | Source: Ambulatory Visit | Attending: Cardiovascular Disease | Admitting: Cardiovascular Disease

## 2013-05-20 ENCOUNTER — Encounter: Payer: Self-pay | Admitting: Gastroenterology

## 2013-05-22 ENCOUNTER — Encounter (HOSPITAL_COMMUNITY)
Admission: RE | Admit: 2013-05-22 | Discharge: 2013-05-22 | Disposition: A | Discharge status: Home / Self Care | Payer: Medicare Other | Source: Ambulatory Visit | Attending: Cardiovascular Disease | Admitting: Cardiovascular Disease

## 2013-05-23 ENCOUNTER — Other Ambulatory Visit: Payer: Self-pay | Admitting: Cardiovascular Disease

## 2013-05-25 ENCOUNTER — Encounter (HOSPITAL_COMMUNITY)
Admission: RE | Admit: 2013-05-25 | Discharge: 2013-05-25 | Disposition: A | Discharge status: Home / Self Care | Payer: Medicare Other | Source: Ambulatory Visit | Attending: Cardiovascular Disease | Admitting: Cardiovascular Disease

## 2013-05-25 NOTE — Telephone Encounter (Signed)
Rx was sent to pharmacy electronically. 

## 2013-05-27 ENCOUNTER — Encounter (HOSPITAL_COMMUNITY)
Admission: RE | Admit: 2013-05-27 | Discharge: 2013-05-27 | Disposition: A | Discharge status: Home / Self Care | Payer: Medicare Other | Source: Ambulatory Visit | Attending: Cardiovascular Disease | Admitting: Cardiovascular Disease

## 2013-05-29 ENCOUNTER — Encounter (HOSPITAL_COMMUNITY): Payer: Medicare Other

## 2013-06-01 ENCOUNTER — Encounter (HOSPITAL_COMMUNITY)
Admission: RE | Admit: 2013-06-01 | Discharge: 2013-06-01 | Disposition: A | Discharge status: Home / Self Care | Payer: Medicare Other | Source: Ambulatory Visit | Attending: Cardiovascular Disease | Admitting: Cardiovascular Disease

## 2013-06-03 ENCOUNTER — Encounter (HOSPITAL_COMMUNITY)
Admission: RE | Admit: 2013-06-03 | Discharge: 2013-06-03 | Disposition: A | Discharge status: Home / Self Care | Payer: Medicare Other | Source: Ambulatory Visit | Attending: Cardiovascular Disease | Admitting: Cardiovascular Disease

## 2013-06-05 ENCOUNTER — Encounter (HOSPITAL_COMMUNITY)
Admission: RE | Admit: 2013-06-05 | Discharge: 2013-06-05 | Disposition: A | Discharge status: Home / Self Care | Payer: Medicare Other | Source: Ambulatory Visit | Attending: Cardiovascular Disease | Admitting: Cardiovascular Disease

## 2013-06-08 ENCOUNTER — Encounter (HOSPITAL_COMMUNITY)
Admission: RE | Admit: 2013-06-08 | Discharge: 2013-06-08 | Disposition: A | Discharge status: Home / Self Care | Payer: Medicare Other | Source: Ambulatory Visit | Attending: Cardiovascular Disease | Admitting: Cardiovascular Disease

## 2013-06-08 DIAGNOSIS — Z5189 Encounter for other specified aftercare: Secondary | ICD-10-CM | POA: Diagnosis not present

## 2013-06-08 DIAGNOSIS — I214 Non-ST elevation (NSTEMI) myocardial infarction: Secondary | ICD-10-CM | POA: Insufficient documentation

## 2013-06-08 DIAGNOSIS — Z9861 Coronary angioplasty status: Secondary | ICD-10-CM | POA: Insufficient documentation

## 2013-06-10 ENCOUNTER — Encounter (HOSPITAL_COMMUNITY)
Admission: RE | Admit: 2013-06-10 | Discharge: 2013-06-10 | Disposition: A | Discharge status: Home / Self Care | Payer: Medicare Other | Source: Ambulatory Visit | Attending: Cardiovascular Disease | Admitting: Cardiovascular Disease

## 2013-06-12 ENCOUNTER — Encounter (HOSPITAL_COMMUNITY): Admission: RE | Admit: 2013-06-12 | Payer: Medicare Other | Source: Ambulatory Visit

## 2013-06-15 ENCOUNTER — Encounter (HOSPITAL_COMMUNITY)
Admission: RE | Admit: 2013-06-15 | Discharge: 2013-06-15 | Disposition: A | Discharge status: Home / Self Care | Payer: Medicare Other | Source: Ambulatory Visit | Attending: Cardiovascular Disease | Admitting: Cardiovascular Disease

## 2013-06-17 ENCOUNTER — Encounter (HOSPITAL_COMMUNITY)
Admission: RE | Admit: 2013-06-17 | Discharge: 2013-06-17 | Disposition: A | Discharge status: Home / Self Care | Payer: Medicare Other | Source: Ambulatory Visit | Attending: Cardiovascular Disease | Admitting: Cardiovascular Disease

## 2013-06-19 ENCOUNTER — Encounter (HOSPITAL_COMMUNITY)
Admission: RE | Admit: 2013-06-19 | Discharge: 2013-06-19 | Disposition: A | Discharge status: Home / Self Care | Payer: Medicare Other | Source: Ambulatory Visit | Attending: Cardiovascular Disease | Admitting: Cardiovascular Disease

## 2013-06-22 ENCOUNTER — Encounter (HOSPITAL_COMMUNITY)
Admission: RE | Admit: 2013-06-22 | Discharge: 2013-06-22 | Disposition: A | Discharge status: Home / Self Care | Payer: Medicare Other | Source: Ambulatory Visit | Attending: Cardiovascular Disease | Admitting: Cardiovascular Disease

## 2013-06-24 ENCOUNTER — Encounter (HOSPITAL_COMMUNITY): Payer: Medicare Other

## 2013-06-26 ENCOUNTER — Encounter (HOSPITAL_COMMUNITY): Payer: Medicare Other

## 2013-06-29 ENCOUNTER — Encounter: Payer: Self-pay | Admitting: Gastroenterology

## 2013-06-29 ENCOUNTER — Encounter (HOSPITAL_COMMUNITY)
Admission: RE | Admit: 2013-06-29 | Discharge: 2013-06-29 | Disposition: A | Discharge status: Home / Self Care | Payer: Medicare Other | Source: Ambulatory Visit | Attending: Cardiovascular Disease | Admitting: Cardiovascular Disease

## 2013-07-01 ENCOUNTER — Other Ambulatory Visit: Payer: Self-pay | Admitting: Cardiovascular Disease

## 2013-07-01 ENCOUNTER — Encounter (HOSPITAL_COMMUNITY)
Admission: RE | Admit: 2013-07-01 | Discharge: 2013-07-01 | Disposition: A | Discharge status: Home / Self Care | Payer: Medicare Other | Source: Ambulatory Visit | Attending: Cardiovascular Disease | Admitting: Cardiovascular Disease

## 2013-07-01 NOTE — Telephone Encounter (Signed)
Rx was sent to pharmacy electronically.  Plavix denied - per last office visit patient is currently taking Brilinta.

## 2013-07-03 ENCOUNTER — Encounter (HOSPITAL_COMMUNITY): Payer: Medicare Other

## 2013-07-06 ENCOUNTER — Encounter (HOSPITAL_COMMUNITY)
Admission: RE | Admit: 2013-07-06 | Discharge: 2013-07-06 | Disposition: A | Discharge status: Home / Self Care | Payer: Medicare Other | Source: Ambulatory Visit | Attending: Cardiovascular Disease | Admitting: Cardiovascular Disease

## 2013-07-06 DIAGNOSIS — Z9861 Coronary angioplasty status: Secondary | ICD-10-CM | POA: Diagnosis not present

## 2013-07-06 DIAGNOSIS — Z5189 Encounter for other specified aftercare: Secondary | ICD-10-CM | POA: Diagnosis not present

## 2013-07-06 DIAGNOSIS — I214 Non-ST elevation (NSTEMI) myocardial infarction: Secondary | ICD-10-CM | POA: Diagnosis not present

## 2013-07-08 ENCOUNTER — Encounter (HOSPITAL_COMMUNITY)
Admission: RE | Admit: 2013-07-08 | Discharge: 2013-07-08 | Disposition: A | Discharge status: Home / Self Care | Payer: Medicare Other | Source: Ambulatory Visit | Attending: Cardiovascular Disease | Admitting: Cardiovascular Disease

## 2013-07-10 ENCOUNTER — Encounter (HOSPITAL_COMMUNITY): Payer: Medicare Other

## 2013-07-13 ENCOUNTER — Encounter (HOSPITAL_COMMUNITY)
Admission: RE | Admit: 2013-07-13 | Discharge: 2013-07-13 | Disposition: A | Discharge status: Home / Self Care | Payer: Medicare Other | Source: Ambulatory Visit | Attending: Cardiovascular Disease | Admitting: Cardiovascular Disease

## 2013-07-15 ENCOUNTER — Encounter (HOSPITAL_COMMUNITY)
Admission: RE | Admit: 2013-07-15 | Discharge: 2013-07-15 | Disposition: A | Discharge status: Home / Self Care | Payer: Medicare Other | Source: Ambulatory Visit | Attending: Cardiovascular Disease | Admitting: Cardiovascular Disease

## 2013-07-15 ENCOUNTER — Encounter (HOSPITAL_COMMUNITY): Payer: Self-pay

## 2013-07-15 NOTE — Progress Notes (Signed)
Pt graduated from cardiac rehab program today.  Medication list reconciled.  PHQ9 score-  0.  Pt has made significant lifestyle changes and should be commended for his success. Pt plans to continue exercising on his own with family at home gym.

## 2013-07-17 ENCOUNTER — Encounter (HOSPITAL_COMMUNITY): Payer: Medicare Other

## 2013-07-20 ENCOUNTER — Encounter (HOSPITAL_COMMUNITY): Payer: Medicare Other

## 2013-07-22 ENCOUNTER — Encounter (HOSPITAL_COMMUNITY): Payer: Medicare Other

## 2013-07-24 ENCOUNTER — Encounter (HOSPITAL_COMMUNITY): Payer: Medicare Other

## 2013-07-27 ENCOUNTER — Encounter (HOSPITAL_COMMUNITY): Payer: Medicare Other

## 2013-07-27 ENCOUNTER — Other Ambulatory Visit: Payer: Self-pay

## 2013-07-27 MED ORDER — ROSUVASTATIN CALCIUM 40 MG PO TABS: 40.0000 mg | ORAL_TABLET | Freq: Every day | ORAL | Status: DC

## 2013-07-27 NOTE — Telephone Encounter (Signed)
Rx was sent to pharmacy electronically. 

## 2013-07-29 ENCOUNTER — Encounter (HOSPITAL_COMMUNITY): Payer: Medicare Other

## 2013-07-31 ENCOUNTER — Encounter (HOSPITAL_COMMUNITY): Payer: Medicare Other

## 2013-08-04 ENCOUNTER — Other Ambulatory Visit: Payer: Self-pay | Admitting: *Deleted

## 2013-08-04 MED ORDER — ISOSORBIDE MONONITRATE ER 60 MG PO TB24: 60.0000 mg | ORAL_TABLET | Freq: Every day | ORAL | Status: DC

## 2013-08-04 NOTE — Telephone Encounter (Signed)
Rx. Refill sent to patient pharmacy

## 2013-08-06 ENCOUNTER — Telehealth: Payer: Self-pay | Admitting: *Deleted

## 2013-08-06 NOTE — Telephone Encounter (Signed)
Yes, thanks

## 2013-08-06 NOTE — Telephone Encounter (Signed)
If this is for routine screening then OK to wait until office spot available with me.  thanks

## 2013-08-06 NOTE — Telephone Encounter (Signed)
Dr. Ardis Hughs,  This pt has a PV scheduled for 08-11-13 and colonoscopy 08-25-13.  He has an extensive cardiac history and is on Brilinta.  I know we usually have pts see the extender if on a blood thinner.  But, with his history, do you want to see him in the office yourself?  Thanks, [employer]

## 2013-08-06 NOTE — Telephone Encounter (Signed)
Dr. Ardis Hughs, [facility], your entire months of May and April are booked.  Is it ok if I put this pt in an END spot?  I just wanted to check before I called this pt.  Thanks, [employer]

## 2013-08-06 NOTE — Telephone Encounter (Signed)
Per Dr. Ardis Hughs, pt needs an OV with him before procedure.   He doesn't have any appointments available for April or May and June schedule isn't available.  I spoke with the pt and explained why he needs to have OV first. .  I told him we would call him to set up a new pt appointment as soon as schedule is available.  Understanding voiced.   Note sent to Christian Mate so she is aware as well

## 2013-08-13 ENCOUNTER — Ambulatory Visit (INDEPENDENT_AMBULATORY_CARE_PROVIDER_SITE_OTHER): Payer: Medicare Other | Admitting: Family Medicine

## 2013-08-13 VITALS — BP 110/80 | HR 60 | Temp 97.9°F | Resp 16 | Ht 68.0 in | Wt 212.0 lb

## 2013-08-13 DIAGNOSIS — T148 Other injury of unspecified body region: Secondary | ICD-10-CM | POA: Diagnosis not present

## 2013-08-13 DIAGNOSIS — W57XXXA Bitten or stung by nonvenomous insect and other nonvenomous arthropods, initial encounter: Secondary | ICD-10-CM

## 2013-08-13 MED ORDER — DOXYCYCLINE HYCLATE 100 MG PO TABS: 100.0000 mg | ORAL_TABLET | Freq: Two times a day (BID) | ORAL | Status: DC

## 2013-08-13 NOTE — Patient Instructions (Signed)
You likely will not have any ill effects from your tick bite.  However, if you develop any rash on your hands/ feet/ ankles/ wrists or have any sx of malaise, aches, fever, or "the flu" start on the doxycycline right away  [place] Spotted Fever (RMSF) is the oldest known tick-borne disease of people in the [place]. This disease was named because it was first described among people in the [facility] area who had an illness characterized by a rash with red-purple-black spots. This disease is caused by a rickettsia (Rickettsia rickettsii), a bacteria carried by the tick. [facility] wood tick and the American dog tick, acquire and transmit the RMSF bacteria (pictures NOT actual size). When a larval, nymphal or adult tick feeds on an infected rodent or larger animal, the tick can become infected. Infected adult ticks then feed on people who may then get RMSF. The tick transmits the disease to humans during a prolonged period of feeding that lasts many hours, days or even a couple weeks. The bite is painless and frequently goes unnoticed. An infected male tick may also pass the rickettsial bacteria to her eggs that then may mature to be infected adult ticks. The rickettsia that causes RMSF can also get into a person's body through damaged skin. A tick bite is not necessary. People can get RMSF if they crush a tick and get it's blood or body fluids on their skin through a small cut or sore.  DIAGNOSIS Diagnosis is made by laboratory tests.  TREATMENT Treatment is with antibiotics (medications that kill rickettsia and other bacteria). Immediate treatment usually prevents death. GEOGRAPHIC RANGE This disease was reported only in the [facility] until 1931. RMSF has more recently been described among individuals in all states except [place], [place] and [place]. The highest reported incidences of RMSF now occur among residents of [place], [place], [place]  and the [place]. TIME OF YEAR  Most cases are diagnosed during late spring and summer when ticks are most active. However, especially in the warmer [place] states, a few cases occur during the winter. SYMPTOMS   Symptoms of RMSF begin from 2 to 14 days after a tick bite. The most common early symptoms are fever, muscle aches and headache followed by nausea (feeling sick to your stomach) or vomiting.  The RMSF rash is typically delayed until 3 or more days after symptom onset, and eventually develops in 9 of 10 infected patients by the 5th day of illness. If the disease is not treated it can cause death. If you get a fever, headache, muscle aches, rash, nausea or vomiting within 2 weeks of a possible tick bite or exposure you should see your caregiver immediately. PREVENTION Ticks prefer to hide in shady, moist ground litter. They can often be found above the ground clinging to tall grass, brush, shrubs and low tree branches. They also inhabit lawns and gardens, especially at the edges of woodlands and around old stone walls. Within the areas where ticks generally live, no naturally vegetated area can be considered completely free of infected ticks. The best precaution against RMSF is to avoid contact with soil, leaf litter and vegetation as much as possible in tick infested areas. For those who enjoy gardening or walking in their yards, clear brush and mow tall grass around houses and at the edges of gardens. This may help reduce the tick population in the immediate area. Applications of chemical insecticides by a licensed professional in the spring (  late May) and Fall (September) will also control ticks, especially in heavily infested areas. Treatment will never get rid of all the ticks. Getting rid of small animal populations that host ticks will also decrease the tick population. When working in the garden, [employer], or handling soil and vegetation, wear light-colored protective clothing and  gloves. Spot-check often to prevent ticks from reaching the skin. Ticks cannot jump or fly. They will not drop from an above-ground perch onto a passing animal. Once a tick gains access to human skin it climbs upward until it reaches a more protected area. For example, the back of the knee, groin, navel, armpit, ears or nape of the neck. It then begins the slow process of embedding itself in the skin. Campers, hikers, field workers, and others who spend time in wooded, brushy or tall grassy areas can avoid exposure to ticks by using the following precautions:  Wear light-colored clothing with a tight weave to spot ticks more easily and prevent contact with the skin.  Wear long pants tucked into socks, long-sleeved shirts tucked into pants and enclosed shoes or boots along with insect repellent.  Spray clothes with insect repellent containing either DEET or Permethrin. Only DEET can be used on exposed skin. Follow the manufacturer's directions carefully.  Wear a hat and keep long hair pulled back.  Stay on cleared, well-worn trails whenever possible.  Spot-check yourself and others often for the presence of ticks on clothes. If you find one, there are likely to be others. Check thoroughly.  Remove clothes after leaving tick-infested areas. If possible, wash them to eliminate any unseen ticks. Check yourself, your children and any pets from head to toe for the presence of ticks.  Shower and shampoo. You can greatly reduce your chances of contracting RMSF if you remove attached ticks as soon as possible. Regular checks of the body, including all body sites covered by hair (head, armpits, genitals), allow removal of the tick before rickettsial transmission. To remove an attached tick, use a forceps or tweezers to detach the intact tick without leaving mouth parts in the skin. The tick bite wound should be cleansed after tick removal. Remember the most common symptoms of RMSF are fever, muscle aches,  headache and nausea or vomiting with a later onset of rash. If you get these symptoms after a tick bite and while living in an area where RMSF is found, RMSF should be suspected. If the disease is not treated, it can cause death. See your caregiver immediately if you get these symptoms. Do this even if not aware of a tick bite. Document Released: 08/05/2000 Document Revised: 07/16/2011 Document Reviewed: 03/28/2009 [facility] Patient Information 2014 [place], [place].

## 2013-08-13 NOTE — Progress Notes (Signed)
[facility] [address], [place] [address] [phone]  Date:  08/13/2013   Name:  Joshua Hahn   DOB:  04-27-1946   MRN:  [id number]  PCP:  Georgetta Haber, MD    Chief Complaint: Insect Bite   History of Present Illness:  Joshua Hahn is a 68 y.o. very pleasant male patient who presents with the following:  He pulled a tick off his right side last night- it was attached but non- engorged.  He brings in a non- engorged dog tick in a baggie.  He is leaving for a trip to [place] tomorrow and will be gone for some time.  He is taking malarone for malaria prophylaxis.   He currently feels well, no fever or chills, no rash, no malaise.    Patient Active Problem List   Diagnosis Date Noted  . Chronic renal insufficiency, stage III (moderate) 02/05/2013  . NSTEMI (non-ST elevated myocardial infarction) 02/04/2013  . CAD - PCI 1980s, CABG X 4 1994, DES CFX April 2010 01/11/2013  . Old MI (myocardial infarction) 01/11/2013  . OSA on CPAP 01/11/2013  . ADENOCARCINOMA, PROSTATE, GLEASON GRADE 7 04/05/2009  . PROSTATITIS, ACUTE 12/08/2008  . NEOPLASM UNCERTAIN BEHAVIOR MAJOR SALIV GLANDS 02/24/2008  . URI 09/05/2007  . BACK PAIN 09/05/2007  . HYPERLIPIDEMIA 12/18/2006  . HYPERTENSION 12/18/2006    Past Medical History  Diagnosis Date  . Hypertension   . 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 levels LDL IIIa+b% and HDL2b%  . Coronary artery disease 1994    PCI'80s, CABG '94, CFX DES 4/10  . HTN (hypertension) 10/16/2011    Echo -EF 50-55% ,LV NORMAL  . Sleep apnea, obstructive     using C-PAP  . Myocardial infarction 1994    inferior wall MI at [facility]    Past Surgical History  Procedure Laterality Date  . Coronary artery bypass graft  1994  . Cardiac catheterization  06/23/2008    occlusion sequential OM1 andOM2 graft as well vein graft to  RCA.LAD and diagonal system remain intact. RCA  collateralized  . Percutaneous coronary stent intervention (pci-s)  06/25/2008    rotational atherectomy w/diffuse disease native circ. w/drug eluting stents , PTCA on one vessel; grat occlusion to RCA w/native RCA proxim. occlusion. Circ.supplied collaaterals distal RCA  following intervention  . Left parotid gland surgry  69/6295    Dr. Erik Obey  . Prostatectomy  06/2009    robotic-asssisted laparoscopic radical by Dr Rosana Hoes  . Exploratory laparotomy      ideopathic retroperitoneal fibrosis    History  Substance Use Topics  . Smoking status: Never Smoker   . Smokeless tobacco: Never Used  . Alcohol Use: No    Family History  Problem Relation Age of Onset  . Coronary artery disease Brother 38    2 bros with early CAD    No Known Allergies  Medication list has been reviewed and updated.  Current Outpatient Prescriptions on File Prior to Visit  Medication Sig Dispense Refill  . aspirin 81 MG tablet Take 81 mg by mouth daily.      . cholecalciferol (VITAMIN D) 1000 UNITS tablet Take 1,000 Units by mouth daily.      . Coenzyme Q10 (CO Q 10) 100 MG CAPS Take 300 mg by mouth every evening.       . isosorbide mononitrate (IMDUR) 60 MG 24 hr tablet  Take 1 tablet (60 mg total) by mouth daily.  90 tablet  3  . losartan (COZAAR) 50 MG tablet Take 1 tablet (50 mg total) by mouth daily.  90 tablet  3  . metoprolol tartrate (LOPRESSOR) 25 MG tablet TAKE 1 TABLET BY MOUTH TWICE A DAY  180 tablet  3  . nitroGLYCERIN (NITROSTAT) 0.4 MG SL tablet Place 0.4 mg under the tongue every 5 (five) minutes as needed for chest pain.      Marland Kitchen RANEXA 500 MG 12 hr tablet TAKE 1 TABLET TWICE DAILY  180 tablet  2  . rosuvastatin (CRESTOR) 40 MG tablet Take 1 tablet (40 mg total) by mouth daily.  30 tablet  9  . Ticagrelor (BRILINTA) 90 MG TABS tablet Take 1 tablet (90 mg total) by mouth 2 (two) times daily.  60 tablet  10  . vitamin B-12 (CYANOCOBALAMIN) 100 MCG  tablet Take 100 mcg by mouth daily.       . vitamin C (ASCORBIC ACID) 500 MG tablet Take 1,000 mg by mouth daily.      Marland Kitchen omega-3 acid ethyl esters (LOVAZA) 1 G capsule Take 2 g by mouth 2 (two) times daily.       No current facility-administered medications on file prior to visit.    Review of Systems:  As per HPI- otherwise negative.   Physical Examination: Filed Vitals:   08/13/13 1211  BP: 110/80  Pulse: 60  Temp: 97.9 F (36.6 C)  Resp: 16   Filed Vitals:   08/13/13 1211  Height: 5\' 8"  (1.727 m)  Weight: 212 lb (96.163 kg)   Body mass index is 32.24 kg/(m^2). Ideal Body Weight: Weight in (lb) to have BMI = 25: 164.1  GEN: WDWN, NAD, Non-toxic, A & O x 3, looks well HEENT: Atraumatic, Normocephalic. Neck supple. No masses, No LAD. Ears and Nose: No external deformity. CV: RRR, No M/G/R. No JVD. No thrill. No extra heart sounds. PULM: CTA B, no wheezes, crackles, rhonchi. No retractions. No resp. distress. No accessory muscle use. EXTR: No c/c/e NEURO Normal gait.  PSYCH: Normally interactive. Conversant. Not depressed or anxious appearing.  Calm demeanor.  slightly inflamed bite site on right flank.    Assessment and Plan: Tick bite - Plan: doxycycline (VIBRA-TABS) 100 MG tablet  Explained that he is unlikely to develop RMSF, but will provide an rx for doxycycline as he will be in [place].  There is a possible interaction between his malaria medication malarone and doxycycline- however discussed with ID pharmacist at [facility] who still felt that doxycycline was the best choice here.    Went over signs and sx of RMSF; he will start doxycycline at the first sign of illness and seek care if possible.    Signed Lamar Blinks, MD

## 2013-08-25 ENCOUNTER — Encounter: Payer: Medicare Other | Admitting: Gastroenterology

## 2013-09-04 ENCOUNTER — Telehealth: Payer: Self-pay

## 2013-09-04 DIAGNOSIS — C61 Malignant neoplasm of prostate: Secondary | ICD-10-CM | POA: Diagnosis not present

## 2013-09-04 NOTE — Telephone Encounter (Signed)
Left message on machine to call back  

## 2013-09-04 NOTE — Telephone Encounter (Signed)
Message copied by Barron Alvine on Fri Sep 04, 2013  8:27 AM ------      Message from: Barron Alvine      Created: Mon Aug 10, 2013  8:29 AM         Per Dr. Ardis Hughs, pt needs an OV with him before procedure.   He doesn't have any appointments available for April or May and June schedule isn't available.  I spoke with the pt and explained why he needs to have OV first. .  I told him we would call him to set up a new pt appointment as soon as schedule is available.  Understanding voiced.         Note sent to [facility]   ------

## 2013-09-04 NOTE — Telephone Encounter (Signed)
Pt has been scheduled.  °

## 2013-09-09 ENCOUNTER — Other Ambulatory Visit: Payer: Self-pay | Admitting: *Deleted

## 2013-09-09 MED ORDER — OMEGA-3-ACID ETHYL ESTERS 1 G PO CAPS: 2.0000 g | ORAL_CAPSULE | Freq: Two times a day (BID) | ORAL | Status: DC

## 2013-09-09 NOTE — Telephone Encounter (Signed)
Rx refill sent to patient pharmacy   

## 2013-09-11 DIAGNOSIS — N529 Male erectile dysfunction, unspecified: Secondary | ICD-10-CM | POA: Diagnosis not present

## 2013-09-11 DIAGNOSIS — E559 Vitamin D deficiency, unspecified: Secondary | ICD-10-CM | POA: Diagnosis not present

## 2013-09-11 DIAGNOSIS — C61 Malignant neoplasm of prostate: Secondary | ICD-10-CM | POA: Diagnosis not present

## 2013-10-01 DIAGNOSIS — H1045 Other chronic allergic conjunctivitis: Secondary | ICD-10-CM | POA: Diagnosis not present

## 2013-10-01 DIAGNOSIS — H40029 Open angle with borderline findings, high risk, unspecified eye: Secondary | ICD-10-CM | POA: Diagnosis not present

## 2013-10-21 ENCOUNTER — Telehealth: Payer: Self-pay | Admitting: *Deleted

## 2013-10-21 NOTE — Telephone Encounter (Signed)
RN spoke with patient. He is taking Brilinta 90mg  BID instead of Plavix (our office has been getting refill requests for Plavix). RN faxed noted to pharmacy on 6/16 stating that patient was changed from Plavix to [place] in Oct. 2014.

## 2013-10-30 ENCOUNTER — Encounter: Payer: Self-pay | Admitting: Gastroenterology

## 2013-10-30 ENCOUNTER — Ambulatory Visit (INDEPENDENT_AMBULATORY_CARE_PROVIDER_SITE_OTHER): Payer: Medicare Other | Admitting: Gastroenterology

## 2013-10-30 VITALS — BP 124/88 | HR 80 | Ht 68.5 in | Wt 216.0 lb

## 2013-10-30 DIAGNOSIS — Z1211 Encounter for screening for malignant neoplasm of colon: Secondary | ICD-10-CM | POA: Diagnosis not present

## 2013-10-30 NOTE — Progress Notes (Signed)
HPI: This is a  very pleasant 68 year old man who I am meeting for the first time today.    Colonoscopy Dr. Lyla Son, 2005 done for routine screening. No polyps were found, he did note left-sided diverticulosis. He was recommended to have repeat colonoscopy at 7 year interval as well as annual Hemoccult testing.  No colon cancer in family.  No GI symptoms.  Has been on brilinta for 6 months.  October 2014 he underwent coronary angiogram and placement of another drug-eluting stent. He had 2 previous drug eluding stents placed in 2010 as well as coronary artery bypass grafting in the 1990s I believe.   Review of systems: Pertinent positive and negative review of systems were noted in the above HPI section. Complete review of systems was performed and was otherwise normal.    Past Medical History  Diagnosis Date  . 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 levels LDL IIIa+b% and HDL2b%  . Coronary artery disease 1994    PCI'80s, CABG '94, CFX DES 4/10  . HTN (hypertension) 10/16/2011    Echo -EF 50-55% ,LV NORMAL  . Sleep apnea, obstructive     using C-PAP  . Myocardial infarction 1994    inferior wall MI at [facility]and [facility]G  . Diverticulosis   . Prostate cancer     Past Surgical History  Procedure Laterality Date  . Coronary artery bypass graft  1994  . Cardiac catheterization  06/23/2008    occlusion sequential OM1 andOM2 graft as well vein graft to RCA.LAD and diagonal system remain intact. RCA  collateralized  . Percutaneous coronary stent intervention (pci-s)  06/25/2008    rotational atherectomy w/diffuse disease native circ. w/drug eluting stents , PTCA on one vessel; grat occlusion to RCA w/native RCA proxim. occlusion. Circ.supplied collaaterals distal RCA  following intervention  . Left parotid gland surgry  37/8588    Dr. Erik Obey  . Prostatectomy  06/2009    robotic-asssisted  laparoscopic radical by Dr Rosana Hoes  . Exploratory laparotomy      ideopathic retroperitoneal fibrosis    Current Outpatient Prescriptions  Medication Sig Dispense Refill  . aspirin 81 MG tablet Take 81 mg by mouth daily.      Marland Kitchen atovaquone-proguanil (MALARONE) 250-100 MG TABS Take 1 tablet by mouth daily.      . cholecalciferol (VITAMIN D) 1000 UNITS tablet Take 1,000 Units by mouth daily.      . Coenzyme Q10 (CO Q 10) 100 MG CAPS Take 300 mg by mouth every evening.       Marland Kitchen losartan (COZAAR) 50 MG tablet Take 1 tablet (50 mg total) by mouth daily.  90 tablet  3  . metoprolol tartrate (LOPRESSOR) 25 MG tablet TAKE 1 TABLET BY MOUTH TWICE A DAY  180 tablet  3  . omega-3 acid ethyl esters (LOVAZA) 1 G capsule Take 2 capsules (2 g total) by mouth 2 (two) times daily.  120 capsule  2  . RANEXA 500 MG 12 hr tablet TAKE 1 TABLET TWICE DAILY  180 tablet  2  . rosuvastatin (CRESTOR) 40 MG tablet Take 1 tablet (40 mg total) by mouth daily.  30 tablet  9  . Ticagrelor (BRILINTA) 90 MG TABS tablet Take 1 tablet (90 mg total) by mouth 2 (two) times daily.  60 tablet  10  . vitamin B-12 (CYANOCOBALAMIN) 100 MCG tablet Take 100 mcg by mouth daily.       Marland Kitchen  vitamin C (ASCORBIC ACID) 500 MG tablet Take 1,000 mg by mouth daily.      . nitroGLYCERIN (NITROSTAT) 0.4 MG SL tablet Place 0.4 mg under the tongue every 5 (five) minutes as needed for chest pain.       No current facility-administered medications for this visit.    Allergies as of 10/30/2013  . (No Known Allergies)    Family History  Problem Relation Age of Onset  . Coronary artery disease Brother 61    2 bros with early CAD  . Pancreatic cancer Father   . Stroke Mother   . Heart disease Father   . Heart disease Brother     x 3  . Hypertension Father   . Hypertension Mother   . Throat cancer Brother     History   Social History  . Marital Status: Married    Spouse Name: N/A    Number of Children: 2  . Years of Education: N/A    Occupational History  . pastor    Social History Main Topics  . Smoking status: Former Smoker    Types: Cigarettes    Quit date: 05/07/1973  . Smokeless tobacco: Never Used  . Alcohol Use: No  . Drug Use: No  . Sexual Activity: Not on file   Other Topics Concern  . Not on file   Social History Narrative  . No narrative on file       Physical Exam: BP 124/88  Pulse 80  Ht 5' 8.5" (1.74 m)  Wt 216 lb (97.977 kg)  BMI 32.36 kg/m2 Constitutional: generally well-appearing Psychiatric: alert and oriented x3 Eyes: extraocular movements intact Mouth: oral pharynx moist, no lesions Neck: supple no lymphadenopathy Cardiovascular: heart regular rate and rhythm Lungs: clear to auscultation bilaterally Abdomen: soft, nontender, nondistended, no obvious ascites, no peritoneal signs, normal bowel sounds Extremities: no lower extremity edema bilaterally Skin: no lesions on visible extremities    Assessment and plan: 68 y.o. male with  routine risk for colon cancer, currently on blood thinning medicine for October 2014 drug eluding stent placed in a coronary artery  We will communicate with Dr. Claiborne Billings about the safety of holding his brilinta  for 5 days prior to a colonoscopy. Usually drug-eluting stents require about a year of anticoagulation, blood thinning prior to electively holding it.  Certainly his screening colonoscopy could wait until later this year when it is safer to hold that blood thinning medicine if it is advised by his cardiologist.

## 2013-10-30 NOTE — Patient Instructions (Addendum)
We will contact Dr. Claiborne Billings from cardiology about safety of holding your brilinta for 5 days prior to a colonoscopy. If he thinks it is not wise yet, should wait another few months then your screening colonoscopy can wait until then.

## 2013-11-10 ENCOUNTER — Telehealth: Payer: Self-pay

## 2013-11-10 NOTE — Telephone Encounter (Signed)
Message copied by Barron Alvine on Tue Nov 10, 2013  9:40 AM ------      Message from: Barron Alvine      Created: Fri Oct 30, 2013 11:05 AM       Need clearance to schedule colon from Dr Claiborne Billings for brilinta see 10/30/13 note  ------

## 2013-11-10 NOTE — Telephone Encounter (Signed)
Letter resent to Dr Claiborne Billings

## 2013-11-11 ENCOUNTER — Telehealth: Payer: Self-pay | Admitting: Cardiovascular Disease

## 2013-11-11 NOTE — Telephone Encounter (Signed)
Wants to know if he can stop his Brilinta for his colonoscopy?This is in Dr Evette Georges in -basket and also Wanda's in-basket.

## 2013-11-11 NOTE — Telephone Encounter (Signed)
Dr. Claiborne Billings please review and advise. If okay then how many days?

## 2013-11-11 NOTE — Telephone Encounter (Signed)
I spoke with Sunday Spillers and she will get a message to Dr Claiborne Billings and his assistant Lavon Paganini I also sent a copy of the letter to [facility]

## 2013-11-11 NOTE — Telephone Encounter (Signed)
Sent to my in box

## 2013-11-13 NOTE — Telephone Encounter (Signed)
He had DES stents inserted on 02/05/13; therefore, ideally should defer elective procedure and not hold for 1 yr. Will need to hold brilinta for 5 days but would not hold until 02/04/14

## 2013-11-13 NOTE — Telephone Encounter (Signed)
Left message on machine to call back  

## 2013-11-13 NOTE — Telephone Encounter (Signed)
Dr. Evette Georges recommendation for colonoscopy sent to Dr. Ardis Hughs.

## 2013-11-13 NOTE — Telephone Encounter (Signed)
Patty, can you contact patient.  As we thought, should not stop brilinta until 10/15;  Can you put him in recall for 03/2014.  Thanks

## 2013-11-13 NOTE — Telephone Encounter (Signed)
See alternate note  

## 2013-11-17 NOTE — Telephone Encounter (Signed)
Left message on machine to call back  

## 2013-11-18 NOTE — Telephone Encounter (Signed)
Left message on machine to call back letter mailed. 

## 2014-02-26 ENCOUNTER — Other Ambulatory Visit: Payer: Self-pay | Admitting: Cardiovascular Disease

## 2014-02-27 NOTE — Telephone Encounter (Signed)
Rx was sent to pharmacy electronically. 

## 2014-03-29 ENCOUNTER — Other Ambulatory Visit: Payer: Self-pay | Admitting: Cardiovascular Disease

## 2014-03-30 ENCOUNTER — Other Ambulatory Visit: Payer: Self-pay | Admitting: Cardiology

## 2014-03-30 NOTE — Telephone Encounter (Signed)
Rx was sent to pharmacy electronically. 

## 2014-03-30 NOTE — Telephone Encounter (Signed)
Rx refill sent to patient pharmacy   

## 2014-04-15 ENCOUNTER — Encounter (HOSPITAL_COMMUNITY): Payer: Self-pay | Admitting: Cardiovascular Disease

## 2014-04-21 ENCOUNTER — Other Ambulatory Visit: Payer: Self-pay | Admitting: Cardiovascular Disease

## 2014-04-22 NOTE — Telephone Encounter (Signed)
Rx was sent to pharmacy electronically. 

## 2014-05-31 ENCOUNTER — Encounter: Payer: Self-pay | Admitting: Cardiovascular Disease

## 2014-05-31 ENCOUNTER — Ambulatory Visit (INDEPENDENT_AMBULATORY_CARE_PROVIDER_SITE_OTHER): Payer: Medicare Other | Admitting: Cardiovascular Disease

## 2014-05-31 VITALS — BP 138/78 | HR 60 | Ht 70.0 in | Wt 219.0 lb

## 2014-05-31 DIAGNOSIS — G4733 Obstructive sleep apnea (adult) (pediatric): Secondary | ICD-10-CM

## 2014-05-31 DIAGNOSIS — I252 Old myocardial infarction: Secondary | ICD-10-CM | POA: Diagnosis not present

## 2014-05-31 DIAGNOSIS — E782 Mixed hyperlipidemia: Secondary | ICD-10-CM | POA: Insufficient documentation

## 2014-05-31 DIAGNOSIS — Z79899 Other long term (current) drug therapy: Secondary | ICD-10-CM | POA: Diagnosis not present

## 2014-05-31 DIAGNOSIS — I2510 Atherosclerotic heart disease of native coronary artery without angina pectoris: Secondary | ICD-10-CM

## 2014-05-31 DIAGNOSIS — Z9989 Dependence on other enabling machines and devices: Secondary | ICD-10-CM

## 2014-05-31 NOTE — Patient Instructions (Signed)
Your physician recommends that you return for lab work fasting.  Your physician has requested that you have en exercise stress myoview and office visit in 6 months.

## 2014-05-31 NOTE — Progress Notes (Signed)
Patient ID: Joshua Hahn, male   DOB: Sep 22, 1945, 69 y.o.   MRN: [id number]      HPI: Joshua Hahn is a 69 y.o. male who presents for a 13 month follow-up cardiology evaluation.   Rev. Hauss has established coronary artery disease dating back to 1988 at which time I performed PTCA of his distal RCA. 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  In 2010 he developed unstable angina and was found to have significant native CAD with total occlusion of the LAD at its ostium, diffuse AV groove circumflex stenoses, 99% stenosis in a diffusely diseased a 1 vessel with 95% on 2 stenosis. The distal limb of the graft was opacified between OM1-2 but had subtotal occlusion. He had an occluded proximal limb of the graft from the aorta and consequently the entire circumflex territory was in jeopardy. He had native RCA occlusion as well as an occluded graft to the RCA. The RCA was collateralized from the circumflex vessel. He had a patent LIMA graft to the diagonal vessel  And patent vein graft to the LAD. On  08/23/2008 he underwent complex intervention to his native circumflex system utilizing high-speed rotational atherectomy was diffusely diseased native circumflex vessel ultimate insertion of a 2.5x28 mm Xience DES stent proximally and 2.25 Taxus stent more distally in tandem.  In January 2013 anuclear perfusion study remained low risk but did show previously noted fixed basal to mid inferior scar. He has a history of prostate CA, status post laparoscopic radical prostatectomy.  Joshua Hahn developed recurrent episodes of chest pain symptoms which led to hospitalization on 02/04/2013. Catheterization revealed a 95%  focal in-stent restenosis in the distal third of the stent in the circumflex vessel and he also had an 85-90% near ostial stenosis in the vein graft supplying the mid LAD. He was hydrated following his catheterization and on up to over 2 2014 underwent two-vessel intervention to the native circumflex vessel for his in-stent restenosis enteroscope/PTCA in the 95% stenosis reduced to 0%, and PTCA/stenting of the ostium in the vein graft supplying the LAD ultimate insertion of a 3.5x15 mm size expedition DES stent was dilated 3.6 mm. The stenosis was reduced to 0%. Subsequently, he has felt significantly improved. He has more energy. He denies shortness of breath with activity. He denies recurrent chest pain  Additional problems include hypertension, mixed hyperlipidemia, obstructive sleep apnea CPAP therapy, prostate cancer, status post radical prostatectomy laparoscopically performed by [name] and left parotid gland surgery. I last saw him I stressed the importance that he continue to use his CPAP therapy with 100% compliant. I also reinitiated ARB therapy with losartan, although the past he had been on Diovan but due to cost issues his insurance company would no longer cover this.   An NMR profile in 2014 shpwed significant improvement with a previous LDL particle #2733  being reduced to 1491. Calculated LDL cholesterol was reduced from 170 to 91. Triglycerides although improved from 289 to 215 were still elevated. Small particles were markedly reduced but still elevated at 859, where previously these had been 1823. Insulin resistance were still elevated at 64.  Since I last saw him in December 2014, Joshua Hahn continues to be active.  Even though he is retired he is still working at times up to 12 hours per day.  He travels to [place] on a yearly basis.  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  He denies recurrent angina.  He denies significant shortness of breath.  He admits to being  more cognizant of his diet.  He tries to exercise fairly regularly.  He did state using CPAP, but it also admits not with 100% compliance.  He's not had recent laboratory.  He presents for evaluation.  Past Medical History  Diagnosis Date  . 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 levels LDL IIIa+b% and HDL2b%  . Coronary artery disease 1994    PCI'80s, CABG '94, CFX DES 4/10  . HTN (hypertension) 10/16/2011    Echo -EF 50-55% ,LV NORMAL  . Sleep apnea, obstructive     using C-PAP  . Myocardial infarction 1994    inferior wall MI at [facility]  . Diverticulosis   . Prostate cancer     Past Surgical History  Procedure Laterality Date  . Coronary artery bypass graft  1994  . Cardiac catheterization  06/23/2008    occlusion sequential OM1 andOM2 graft as well vein graft to RCA.LAD and diagonal system remain intact. RCA  collateralized  . Percutaneous coronary stent intervention (pci-s)  06/25/2008    rotational atherectomy w/diffuse disease native circ. w/drug eluting stents , PTCA on one vessel; grat occlusion to RCA w/native RCA proxim. occlusion. Circ.supplied collaaterals distal RCA  following intervention  . Left parotid gland surgry  46/5681    Dr. Erik Obey  . Prostatectomy  06/2009    robotic-asssisted laparoscopic radical by Dr Rosana Hoes  . Exploratory laparotomy      ideopathic retroperitoneal fibrosis  . Left heart catheterization with coronary/graft angiogram N/A 02/04/2013    Procedure: LEFT HEART CATHETERIZATION WITH Beatrix Fetters;  Surgeon: [name], MD;  Location: [facility] CATH LAB;  Service: Cardiovascular;  Laterality: N/A;  . Percutaneous coronary stent intervention (pci-s) N/A 02/05/2013    Procedure: PERCUTANEOUS CORONARY STENT INTERVENTION (PCI-S);  Surgeon: [name], MD;  Location: [facility] CATH LAB;  Service: Cardiovascular;  Laterality: N/A;    No Known  Allergies  Current Outpatient Prescriptions  Medication Sig Dispense Refill  . aspirin 81 MG tablet Take 81 mg by mouth daily.    Marland Kitchen BRILINTA 90 MG TABS tablet TAKE 1 TABLET BY MOUTH TWICE DAILY 60 tablet 1  . cholecalciferol (VITAMIN D) 1000 UNITS tablet Take 1,000 Units by mouth daily.    . Coenzyme Q10 (CO Q 10) 100 MG CAPS Take 300 mg by mouth every evening.     Marland Kitchen losartan (COZAAR) 50 MG tablet Take 1 tablet (50 mg total) by mouth daily. 90 tablet 0  . metoprolol tartrate (LOPRESSOR) 25 MG tablet Take 1 tablet (25 mg total) by mouth 2 (two) times daily. <PLEASE MAKE APPOINTMENT FOR REFILLS> 180 tablet 0  . nitroGLYCERIN (NITROSTAT) 0.4 MG SL tablet Place 0.4 mg under the tongue every 5 (five) minutes as needed for chest pain.    Marland Kitchen omega-3 acid ethyl esters (LOVAZA) 1 G capsule TAKE 2 CAPSULES (2 G TOTAL) BY MOUTH 2 (TWO) TIMES DAILY. 120 capsule 2  . RANEXA 500 MG 12 hr tablet TAKE 1 TABLET BY MOUTH TWICE DAILY 60 tablet 1  . rosuvastatin (CRESTOR) 40 MG tablet Take 1 tablet (40 mg total) by mouth daily. 30 tablet  9  . vitamin B-12 (CYANOCOBALAMIN) 100 MCG tablet Take 100 mcg by mouth daily.     . vitamin C (ASCORBIC ACID) 500 MG tablet Take 1,000 mg by mouth daily.     No current facility-administered medications for this visit.    History   Social History  . Marital Status: Married    Spouse Name: N/A    Number of Children: 2  . Years of Education: N/A   Occupational History  . pastor    Social History Main Topics  . Smoking status: Former Smoker    Types: Cigarettes    Quit date: 05/07/1973  . Smokeless tobacco: Joshua Used  . Alcohol Use: No  . Drug Use: No  . Sexual Activity: Not on file   Other Topics Concern  . Not on file   Social History Narrative   Socially he is married. He has 2 children 5 grandchildren. 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 There is no tobacco use. He does travel. He was planning to go to [place] on a [place] trip that this will be deferred presently.  Family History  Problem Relation Age of Onset  . Coronary artery disease Brother 72    2 bros with early CAD  . Pancreatic cancer Father   . Stroke Mother   . Heart disease Father   . Heart disease Brother     x 3  . Hypertension Father   . Hypertension Mother   . Throat cancer Brother     ROS General: Negative; No fevers, chills, or night sweats;  HEENT: Negative; No changes in vision or hearing, sinus congestion, difficulty swallowing Pulmonary: Negative; No cough, wheezing, shortness of breath, hemoptysis Cardiovascular: Negative; No chest pain, presyncope, syncope, palpitations GI: Negative; No nausea, vomiting, diarrhea, or abdominal pain GU: Negative; No dysuria, hematuria, or difficulty voiding Musculoskeletal: Negative; no myalgias, joint pain, or weakness Hematologic/Oncology: Negative; no easy bruising, bleeding Endocrine: Negative; no heat/cold intolerance; no diabetes Neuro: Negative; no changes in balance, headaches Skin: Negative; No rashes or skin lesions Psychiatric: Negative; No behavioral problems, depression Sleep: Positive for OSA on CPAP.  No snoring, daytime sleepiness, hypersomnolence, bruxism, restless legs, hypnogognic hallucinations, no cataplexy Other comprehensive 14 point system review is negative.  PE BP 138/78 mmHg  Pulse 60  Ht 5\' 10"  (1.778 m)  Wt 219 lb (99.338 kg)  BMI 31.42 kg/m2  General: Alert, oriented, no distress.  Skin: normal turgor, no rashes HEENT: Normocephalic, atraumatic. Pupils round and reactive; sclera anicteric;no lid lag.  Nose without nasal septal hypertrophy Mouth/Parynx benign; Mallinpatti scale 3 Neck: No JVD, no carotid bruits with normal carotid upstroke. Lungs: clear to ausculatation and percussion; no wheezing or rales Chest wall: Nontender to palpation Heart: RRR,  s1 s2 normal  8-6/7 systolic murmur; no S3 gallop.  No diastolic murmur.  No rubs thrills or heaves. Abdomen: soft, nontender; no hepatosplenomehaly, BS+; abdominal aorta nontender and not dilated by palpation. Pulses 2+ groin catheterization site stable. No ecchymoses or hematoma. Extremities: no clubbing cyanosis or edema, Homan's sign negative  Back: No CVA tenderness Neurologic: grossly nonfocal Psychological: Normal affect and mood; normal cognition  ECG (independently read by me): Normal sinus rhythm at 60 bpm.  First-degree AV block with a PR interval at 236 ms.  Nonspecific ST changes  December 2014 ECG: Sinus rhythm at 61 beats per minute.  Previously old Q wave in 3. Nondiagnostic ST changes   LABS:  BMET    Component Value Date/Time   NA 136 04/13/2013 1505   K 4.7 04/13/2013 1505   CL 99 04/13/2013 1505   CO2 27 04/13/2013 1505   GLUCOSE 84 04/13/2013 1505   GLUCOSE 97 03/04/2006 0946   BUN 10 04/13/2013 1505   CREATININE 1.40* 04/13/2013 1505   CREATININE 1.55* 02/07/2013 0440   CALCIUM 10.2 04/13/2013 1505   GFRNONAA 44* 02/07/2013 0440   GFRAA 51* 02/07/2013 0440     Hepatic Function Panel     Component Value Date/Time   PROT 7.5 04/13/2013 1505   ALBUMIN 4.4 04/13/2013 1505   AST 24 04/13/2013 1505   ALT 20 04/13/2013 1505   ALKPHOS 60 04/13/2013 1505   BILITOT 0.9 04/13/2013 1505   BILIDIR 0.0 02/27/2012 1718     CBC    Component Value Date/Time   WBC 8.2 04/13/2013 1505   WBC 7.8 11/17/2012 1106   RBC 4.95 04/13/2013 1505   RBC 5.05 11/17/2012 1106   HGB 15.0 04/13/2013 1505   HGB 15.3 11/17/2012 1106   HCT 43.9 04/13/2013 1505   HCT 48.9 11/17/2012 1106   PLT 205 04/13/2013 1505   MCV 88.7 04/13/2013 1505   MCV 96.8 11/17/2012 1106   MCH 30.3 04/13/2013 1505   MCH 30.3 11/17/2012 1106   MCHC 34.2 04/13/2013 1505   MCHC 31.3* 11/17/2012 1106   RDW 13.8 04/13/2013 1505   LYMPHSABS 2.5 02/27/2012 1718   MONOABS 0.7 02/27/2012 1718    EOSABS 0.1 02/27/2012 1718   BASOSABS 0.0 02/27/2012 1718     BNP    Component Value Date/Time   PROBNP 310.8* 02/04/2013 0816    Lipid Panel     Component Value Date/Time   CHOL 205* 02/27/2012 1718   TRIG 215* 04/13/2013 1505   TRIG 230.0* 02/27/2012 1718   TRIG 216* 03/04/2006 0946   HDL 39.40 02/27/2012 1718   CHOLHDL 5 02/27/2012 1718   VLDL 46.0* 02/27/2012 1718   LDLCALC 91 04/13/2013 1505   LDLCALC  10/24/2009 0450    UNABLE TO CALCULATE IF TRIGLYCERIDE OVER 400 mg/dL        Total Cholesterol/HDL:CHD Risk Coronary Heart Disease Risk Table                     Men   Women  1/2 Average Risk   3.4   3.3  Average Risk       5.0   4.4  2 X Average Risk   9.6   7.1  3 X Average Risk  23.4   11.0        Use the calculated Patient Ratio above and the CHD Risk Table to determine the patient's CHD Risk.        ATP III CLASSIFICATION (LDL):  <100     mg/dL   Optimal  100-129  mg/dL   Near or Above                    Optimal  130-159  mg/dL   Borderline  160-189  mg/dL   High  >190     mg/dL   Very High     RADIOLOGY: No results found.    ASSESSMENT AND PLAN: Joshua Hahn is now 24 years following his initial PTCA of his distal RCA and 21 years following his inferior wall myocardial infarction and subsequent CABG revascularization surgery. He is  status post complex rotational atherectomy and diffuse stenting of his native circumflex vessel which also supplied his RCA territory. He  developed unstable anginal symptoms in October 2014 and had mild positive troponin ruling in for non-ST segment MI. Catheterization done 02/04/2013 showed focal 95% in-stent restenosis in the distal third of the previously placed stent in the circumflex vessel. This stenosis occurred after the collateral was aeration of the circumflex supplying the distal right coronary artery. He also had high grade 85-90% near ostial stenosis in the vein graft supplying the LAD. Fortunately he underwent  successful two-vessel coronary intervention with cutting balloon into scope to the circumflex in-stent restenosis, and PTCA/insertion of a new DES stent in the ostium of the vein graft supplying the LAD.  He continues to be without recurrent anginal symptoms on his medical regimen currently consisting of Lopressor 25 mg twice a day, Ranexa 500 mg twice a day, is into his losartan 51 g daily.  His blood pressure is well controlled today on this therapy.  I have recommended long-term indefinite antiplatelet therapy with aspirin and Brilinta and he continues to tolerate this well without side effects.  He is on Crestor 40 mg for hyperlipidemia.  He takes coenzyme Q10.  He denies myalgias.  When he goes to [place].  He takes antimalarial prophylaxis.  I am recommending a complete set of blood work be obtained in the fasting state.  Adjustments will be made to his medical regimen if necessary.  He is now 5 years since his complex retention requiring high-speed rotational atherectomy.  In 6 months, I'm scheduling him for a nuclear perfusion study in light of his previous diffuse disease in follow-up of his last two-vessel coronary intervention in a [place] 2014.  This will be helpful to make certain he is not having significant residual a symptomatically ischemia.  At his last presentation.  He was not having significant chest pain symptoms but had noted dyspnea as his initial complaint.  We also discussed importance of 100% compliance with reference to his obstructive sleep apnea with reference to his CPAP use.  I will contact him regarding his blood work.  Adjustment will be made if necessary to his medical regimen.  I will see him in follow-up of his nuclear study in 6 months and additional recommendations will be made at that time.   [name], MD, [facility]  05/31/2014 10:53 AM

## 2014-06-23 ENCOUNTER — Other Ambulatory Visit: Payer: Self-pay | Admitting: Cardiovascular Disease

## 2014-06-23 NOTE — Telephone Encounter (Signed)
Rx(s) sent to pharmacy electronically.  

## 2014-07-16 ENCOUNTER — Other Ambulatory Visit: Payer: Self-pay | Admitting: Cardiovascular Disease

## 2014-07-16 NOTE — Telephone Encounter (Signed)
Rx has been sent to the pharmacy electronically. ° °

## 2014-07-19 ENCOUNTER — Other Ambulatory Visit: Payer: Self-pay | Admitting: Cardiovascular Disease

## 2014-07-19 NOTE — Telephone Encounter (Signed)
Rx has been sent to the pharmacy electronically. ° °

## 2014-07-21 DIAGNOSIS — H40023 Open angle with borderline findings, high risk, bilateral: Secondary | ICD-10-CM | POA: Diagnosis not present

## 2014-07-21 DIAGNOSIS — H25013 Cortical age-related cataract, bilateral: Secondary | ICD-10-CM | POA: Diagnosis not present

## 2014-07-21 DIAGNOSIS — H2513 Age-related nuclear cataract, bilateral: Secondary | ICD-10-CM | POA: Diagnosis not present

## 2014-07-28 DIAGNOSIS — H02831 Dermatochalasis of right upper eyelid: Secondary | ICD-10-CM | POA: Diagnosis not present

## 2014-07-28 DIAGNOSIS — H02413 Mechanical ptosis of bilateral eyelids: Secondary | ICD-10-CM | POA: Diagnosis not present

## 2014-07-28 DIAGNOSIS — H02834 Dermatochalasis of left upper eyelid: Secondary | ICD-10-CM | POA: Diagnosis not present

## 2014-07-28 DIAGNOSIS — H53483 Generalized contraction of visual field, bilateral: Secondary | ICD-10-CM | POA: Diagnosis not present

## 2014-08-06 ENCOUNTER — Telehealth: Payer: Self-pay | Admitting: Cardiovascular Disease

## 2014-08-06 MED ORDER — RANOLAZINE ER 500 MG PO TB12: 500.0000 mg | ORAL_TABLET | Freq: Two times a day (BID) | ORAL | Status: DC

## 2014-08-06 MED ORDER — TICAGRELOR 90 MG PO TABS: 90.0000 mg | ORAL_TABLET | Freq: Two times a day (BID) | ORAL | Status: DC

## 2014-08-06 NOTE — Telephone Encounter (Signed)
Refill submitted. 

## 2014-08-06 NOTE — Telephone Encounter (Signed)
°  1. Which medications need to be refilled?Brilinta and Ranexa  2. Which pharmacy is medication to be sent to?[phone]  3. Do they need a 30 day or 90 day supply? 90 and refills  4. Would they like a call back once the medication has been sent to the pharmacy? no

## 2014-08-09 ENCOUNTER — Encounter: Payer: Self-pay | Admitting: Gastroenterology

## 2014-08-10 DIAGNOSIS — H40023 Open angle with borderline findings, high risk, bilateral: Secondary | ICD-10-CM | POA: Diagnosis not present

## 2014-08-22 ENCOUNTER — Other Ambulatory Visit: Payer: Self-pay | Admitting: Cardiovascular Disease

## 2014-08-23 NOTE — Telephone Encounter (Signed)
Rx has been sent to the pharmacy electronically. ° °

## 2014-08-25 ENCOUNTER — Other Ambulatory Visit: Payer: Self-pay | Admitting: *Deleted

## 2014-08-25 MED ORDER — ROSUVASTATIN CALCIUM 40 MG PO TABS: 40.0000 mg | ORAL_TABLET | Freq: Every day | ORAL | Status: DC

## 2014-08-27 ENCOUNTER — Other Ambulatory Visit: Payer: Self-pay | Admitting: *Deleted

## 2014-08-27 MED ORDER — ROSUVASTATIN CALCIUM 40 MG PO TABS: 40.0000 mg | ORAL_TABLET | Freq: Every day | ORAL | Status: DC

## 2014-08-27 NOTE — Telephone Encounter (Signed)
In person request for refill & samples. Advised no samples for Crestor due to switch to generic, will refill at [place] per pt request.

## 2014-09-08 ENCOUNTER — Other Ambulatory Visit: Payer: Self-pay | Admitting: Cardiovascular Disease

## 2014-09-08 NOTE — Telephone Encounter (Signed)
Rx(s) sent to pharmacy electronically.  

## 2014-09-23 ENCOUNTER — Encounter (HOSPITAL_COMMUNITY): Payer: Medicare Other

## 2014-09-24 ENCOUNTER — Encounter (HOSPITAL_COMMUNITY): Payer: Medicare Other

## 2014-09-28 ENCOUNTER — Ambulatory Visit (INDEPENDENT_AMBULATORY_CARE_PROVIDER_SITE_OTHER): Payer: Medicare Other | Admitting: Family Medicine

## 2014-09-28 ENCOUNTER — Encounter: Payer: Self-pay | Admitting: Family Medicine

## 2014-09-28 VITALS — BP 120/74 | HR 67 | Temp 98.2°F | Wt 216.0 lb

## 2014-09-28 DIAGNOSIS — N2889 Other specified disorders of kidney and ureter: Secondary | ICD-10-CM

## 2014-09-28 DIAGNOSIS — N189 Chronic kidney disease, unspecified: Secondary | ICD-10-CM

## 2014-09-28 DIAGNOSIS — Z23 Encounter for immunization: Secondary | ICD-10-CM

## 2014-09-28 DIAGNOSIS — R430 Anosmia: Secondary | ICD-10-CM

## 2014-09-28 DIAGNOSIS — E785 Hyperlipidemia, unspecified: Secondary | ICD-10-CM

## 2014-09-28 DIAGNOSIS — Z1211 Encounter for screening for malignant neoplasm of colon: Secondary | ICD-10-CM | POA: Diagnosis not present

## 2014-09-28 DIAGNOSIS — N183 Chronic kidney disease, stage 3 unspecified: Secondary | ICD-10-CM

## 2014-09-28 DIAGNOSIS — I10 Essential (primary) hypertension: Secondary | ICD-10-CM

## 2014-09-28 NOTE — Assessment & Plan Note (Signed)
S: Cr 1.4-1.6 range in recent years stable.  A/P: continue BP control, lipid control, losartan 50mg 

## 2014-09-28 NOTE — Progress Notes (Signed)
Joshua Reddish, MD Phone: [phone]  Subjective:  Patient presents today to establish care with me as their new primary care provider. Patient was formerly a patient of Dr. Arnoldo Morale. Chief complaint-noted.   See problem oriented charting- ROS- denies chest pain while on ranexa and imdur, no shortness of breath, no unintentional weight loss, no myalgias  The following were reviewed and entered/updated in epic: Past Medical History  Diagnosis Date  . 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 levels LDL IIIa+b% and HDL2b%  . Coronary artery disease 1994    PCI'80s, CABG '94, CFX DES 4/10  . HTN (hypertension) 10/16/2011    Echo -EF 50-55% ,LV NORMAL  . Sleep apnea, obstructive     using C-PAP  . Myocardial infarction 1994    infer[facility] wall [facility] at [facility]  . Diverticulosis   . Prostate cancer    Patient Active Problem List   Diagnosis Date Noted  . CAD - PCI 1980s, CABG X 4 1994, DES CFX 08/2008, stent 2014 nstemi 01/11/2013    Priority: High  . ADENOCARCINOMA, PROSTATE, GLEASON GRADE 7 04/05/2009    Priority: High  . Anosmia 09/28/2014    Priority: Medium  . Chronic renal insufficiency, stage III (moderate) 02/05/2013    Priority: Medium  . OSA on CPAP 01/11/2013    Priority: Medium  . Hyperlipidemia 12/18/2006    Priority: Medium  . Essential hypertension 12/18/2006    Priority: Medium  . Old MI (myocardial infarction) 01/11/2013    Priority: Low   Past Surgical History  Procedure Laterality Date  . Coronary artery bypass graft  1994  . Cardiac catheterization  06/23/2008    occlusion sequential OM1 andOM2 graft as well vein graft to RCA.LAD and diagonal system remain intact. RCA  collateralized  . Percutaneous coronary stent intervention (pci-s)  06/25/2008    rotational atherectomy w/diffuse disease native circ. w/drug eluting stents , PTCA on one vessel; grat occlusion to RCA  w/native RCA proxim. occlusion. Circ.supplied collaaterals distal RCA  following intervention  . Left parotid gland surgry  25/0539    Dr. Erik Obey  . Prostatectomy  06/2009    robotic-asssisted laparoscopic radical by Dr Rosana Hoes  . Exploratory laparotomy      ideopathic retroperitoneal fibrosis  . Left heart catheterization with coronary/graft angiogram N/A 02/04/2013    Procedure: LEFT HEART CATHETERIZATION WITH Beatrix Fetters;  Surgeon: [name], MD;  Location: [facility] CATH LAB;  Service: Cardiovascular;  Laterality: N/A;  . Percutaneous coronary stent intervention (pci-s) N/A 02/05/2013    Procedure: PERCUTANEOUS CORONARY STENT INTERVENTION (PCI-S);  Surgeon: [name], MD;  Location: [facility] CATH LAB;  Service: Cardiovascular;  Laterality: N/A;    Family History  Problem Relation Age of Onset  . Coronary artery disease Brother 34    2 bros with early CAD  . Pancreatic cancer Father   . Stroke Mother   . Heart disease Father   . Heart disease Brother     x 3  . Hypertension Father   . Hypertension Mother   . Throat cancer Brother     Medications- reviewed and updated Current Outpatient Prescriptions  Medication Sig Dispense Refill  . aspirin 81 MG tablet Take 81 mg by mouth daily.    . cholecalciferol (VITAMIN D) 1000 UNITS tablet Take 1,000 Units by mouth daily.    . Coenzyme Q10 (CO Q 10) 100 MG CAPS Take 300 mg by mouth every  evening.     . isosorbide mononitrate (IMDUR) 60 MG 24 hr tablet TAKE 1 TABLET (60 MG TOTAL) BY MOUTH DAILY. 90 tablet 2  . losartan (COZAAR) 50 MG tablet TAKE 1 TABLET (50 MG TOTAL) BY MOUTH DAILY. 90 tablet 3  . metoprolol tartrate (LOPRESSOR) 25 MG tablet TAKE 1 TABLET BY MOUTH 2 TIMES DAILY. PLEASE MAKE APPOINTMENT FOR REFILLS (Patient not taking: Reported on 09/28/2014) 180 tablet 2  . nitroGLYCERIN (NITROSTAT) 0.4 MG SL tablet Place 0.4 mg under the tongue every 5 (five) minutes as needed for chest pain.    Marland Kitchen omega-3 acid ethyl esters  (LOVAZA) 1 G capsule TAKE 2 CAPSULES (2 G TOTAL) BY MOUTH 2 (TWO) TIMES DAILY. 120 capsule 8  . ranolazine (RANEXA) 500 MG 12 hr tablet Take 1 tablet (500 mg total) by mouth 2 (two) times daily. 90 tablet 1  . rosuvastatin (CRESTOR) 40 MG tablet Take 1 tablet (40 mg total) by mouth daily. 30 tablet 6  . ticagrelor (BRILINTA) 90 MG TABS tablet Take 1 tablet (90 mg total) by mouth 2 (two) times daily. 90 tablet 1  . vitamin B-12 (CYANOCOBALAMIN) 100 MCG tablet Take 100 mcg by mouth daily.     . vitamin C (ASCORBIC ACID) 500 MG tablet Take 1,000 mg by mouth daily.     No current facility-administered medications for this visit.    Allergies-reviewed and updated No Known Allergies  History   Social History  . Marital Status: Married    Spouse Name: N/A  . Number of Children: 2  . Years of Education: N/A   Occupational History  . pastor    Social History Main Topics  . Smoking status: Former Smoker    Types: Cigarettes    Quit date: 05/07/1973  . Smokeless tobacco: Never Used  . Alcohol Use: No  . Drug Use: No  . Sexual Activity: Not on file   Other Topics Concern  . None   Social History Narrative   Married 48 years in 2016. 2 children. 5 grand kids.       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       Hobbies: speaking events, traveling    ROS--See HPI   Objective: BP 120/74 mmHg  Pulse 67  Temp(Src) 98.2 F (36.8 C)  Wt 216 lb (97.977 kg) Gen: NAD, resting comfortably HEENT: Mucous membranes are moist. Oropharynx normal. Some rhinorrhea noted CV: RRR no murmurs rubs or gallops Lungs: CTAB no crackles, wheeze, rhonchi Abdomen: soft/nontender/nondistended/normal bowel sounds.  Ext: no edema Skin: warm, dry, no rash Neuro: grossly normal, moves all extremities, PERRLA   Assessment/Plan:  Hyperlipidemia S: reasonable control last check on Crestor 40 mg. A/P: offered to check lipids but Labs planned 10/2013 with Dr. Claiborne Billings. Continue current rx-max medical  therapy.     Essential hypertension S: excellent control on imdur 60mg , losartan 50mg , metoprolol 25mg  BID A/P: continue current rx    Chronic renal insufficiency, stage III (moderate) S: Cr 1.4-1.6 range in recent years stable.  A/P: continue BP control, lipid control, losartan 50mg     Anosmia S: lost sense of smell and taste over last few months. No medication changes. Does complain of watery/itchy eyes and postnasal drip A/P: advised ENT referral btu declined. Patient will start flonase given some symptoms concerning for allergic rhinitis which is one of many potential causes.      Orders Placed This Encounter  Procedures  . Pneumococcal conjugate vaccine 13-valent  . Ambulatory referral to Gastroenterology  brilinta use would have to be coordinated with Dr. Claiborne Billings With severe cardiac disease, wonder if risks outweigh benefits of colonoscopy

## 2014-09-28 NOTE — Assessment & Plan Note (Signed)
S: lost sense of smell and taste over last few months. No medication changes. Does complain of watery/itchy eyes and postnasal drip A/P: advised ENT referral btu declined. Patient will start flonase given some symptoms concerning for allergic rhinitis which is one of many potential causes.

## 2014-09-28 NOTE — Assessment & Plan Note (Signed)
S: excellent control on imdur 60mg , losartan 50mg , metoprolol 25mg  BID A/P: continue current rx

## 2014-09-28 NOTE — Assessment & Plan Note (Signed)
S: reasonable control last check on Crestor 40 mg. A/P: offered to check lipids but Labs planned 10/2013 with Dr. Claiborne Billings. Continue current rx-max medical therapy.

## 2014-09-28 NOTE — Patient Instructions (Addendum)
[facility] will call you to schedule repeat colonoscopy.  3-6 months follow up. Offered referral to ENT today. Not clear what is causing decreased smell/taste from brief review. You declined for now but we will follow up next visit.   Trial flonase for postnasal drip

## 2014-09-29 ENCOUNTER — Encounter: Payer: Self-pay | Admitting: Physician Assistant

## 2014-09-30 DIAGNOSIS — E559 Vitamin D deficiency, unspecified: Secondary | ICD-10-CM | POA: Diagnosis not present

## 2014-09-30 DIAGNOSIS — C61 Malignant neoplasm of prostate: Secondary | ICD-10-CM | POA: Diagnosis not present

## 2014-09-30 LAB — PSA: PSA: 0.01

## 2014-10-06 DIAGNOSIS — N5201 Erectile dysfunction due to arterial insufficiency: Secondary | ICD-10-CM | POA: Diagnosis not present

## 2014-10-06 DIAGNOSIS — N393 Stress incontinence (female) (male): Secondary | ICD-10-CM | POA: Diagnosis not present

## 2014-10-06 DIAGNOSIS — C61 Malignant neoplasm of prostate: Secondary | ICD-10-CM | POA: Diagnosis not present

## 2014-10-06 DIAGNOSIS — N261 Atrophy of kidney (terminal): Secondary | ICD-10-CM | POA: Diagnosis not present

## 2014-10-06 DIAGNOSIS — E559 Vitamin D deficiency, unspecified: Secondary | ICD-10-CM | POA: Diagnosis not present

## 2014-10-07 ENCOUNTER — Encounter: Payer: Self-pay | Admitting: Family Medicine

## 2014-10-07 LAB — VITAMIN D 25 HYDROXY (VIT D DEFICIENCY, FRACTURES): VIT D 25 HYDROXY: 40

## 2014-10-08 ENCOUNTER — Ambulatory Visit (INDEPENDENT_AMBULATORY_CARE_PROVIDER_SITE_OTHER): Payer: Medicare Other | Admitting: Family Medicine

## 2014-10-08 DIAGNOSIS — Z23 Encounter for immunization: Secondary | ICD-10-CM | POA: Diagnosis not present

## 2014-10-15 ENCOUNTER — Encounter: Payer: Self-pay | Admitting: Physician Assistant

## 2014-10-15 ENCOUNTER — Telehealth: Payer: Self-pay | Admitting: *Deleted

## 2014-10-15 ENCOUNTER — Ambulatory Visit (INDEPENDENT_AMBULATORY_CARE_PROVIDER_SITE_OTHER): Payer: Medicare Other | Admitting: Physician Assistant

## 2014-10-15 VITALS — BP 130/76 | HR 72 | Ht 69.25 in | Wt 214.2 lb

## 2014-10-15 DIAGNOSIS — Z9861 Coronary angioplasty status: Secondary | ICD-10-CM | POA: Diagnosis not present

## 2014-10-15 DIAGNOSIS — Z1211 Encounter for screening for malignant neoplasm of colon: Secondary | ICD-10-CM | POA: Diagnosis not present

## 2014-10-15 DIAGNOSIS — Z7901 Long term (current) use of anticoagulants: Secondary | ICD-10-CM | POA: Diagnosis not present

## 2014-10-15 DIAGNOSIS — I2510 Atherosclerotic heart disease of native coronary artery without angina pectoris: Secondary | ICD-10-CM | POA: Diagnosis not present

## 2014-10-15 MED ORDER — MOVIPREP 100 G PO SOLR: 1.0000 | ORAL | Status: DC

## 2014-10-15 NOTE — Progress Notes (Signed)
i agree with the above note, plan 

## 2014-10-15 NOTE — Progress Notes (Signed)
Patient ID: Joshua Hahn, male   DOB: [birth year], 69 y.o.   MRN: [id number]     History of Present Illness: Joshua Hahn is a pleasant 69 year old male who underwent colonoscopy by Dr. Lyla Son in 2005 for screening. No polyps were noted but he was found to have left-sided diverticulosis. He was advised to have a repeat colonoscopy in 7 years. He presented to be scheduled for screening in June 2015 but had recently been started on brilinta. In October 2014 he underwent coronary angiogram and placement of another drug-eluting stents. He had 2 previous drug-eluting stents placed in 2010 as well as coronary artery bypass grafting in the 1990s. At that time his cardiologist was contacted after his last visit and preferred the patient completed a full year on brilinta  before coming off it for his colonoscopy. He is here today to schedule colonoscopy. He feels well. He has had no change in his bowel habits or stool caliber. He has had no bright red blood per rectum or melena. His appetite is good and his weight is stable. Other medical problems include hypertension, hyperlipidemia, obstructive sleep apnea with CPAP therapy, prostate cancer status post radical prostatectomy, and left parotid gland surgery.   Past Medical History  Diagnosis Date  . 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 levels LDL IIIa+b% and HDL2b%  . Coronary artery disease 1994    PCI'80s, CABG '94, CFX DES 4/10  . HTN (hypertension) 10/16/2011    Echo -EF 50-55% ,LV NORMAL  . Sleep apnea, obstructive     using C-PAP  . Myocardial infarction 1994    inferior wall MI at [facility]  . Diverticulosis   . Prostate cancer     Past Surgical History  Procedure Laterality Date  . Coronary artery bypass graft  1994  . Cardiac catheterization  06/23/2008    occlusion sequential OM1 andOM2 graft as well vein graft to RCA.LAD and diagonal  system remain intact. RCA  collateralized  . Percutaneous coronary stent intervention (pci-s)  06/25/2008    rotational atherectomy w/diffuse disease native circ. w/drug eluting stents , PTCA on one vessel; grat occlusion to RCA w/native RCA proxim. occlusion. Circ.supplied collaaterals distal RCA  following intervention  . Left parotid gland surgry  33/2951    Dr. Erik Obey  . Prostatectomy  06/2009    robotic-asssisted laparoscopic radical by Dr Rosana Hoes  . Exploratory laparotomy      ideopathic retroperitoneal fibrosis  . Left heart catheterization with coronary/graft angiogram N/A 02/04/2013    Procedure: LEFT HEART CATHETERIZATION WITH Beatrix Fetters;  Surgeon: [name], MD;  Location: [facility] CATH LAB;  Service: Cardiovascular;  Laterality: N/A;  . Percutaneous coronary stent intervention (pci-s) N/A 02/05/2013    Procedure: PERCUTANEOUS CORONARY STENT INTERVENTION (PCI-S);  Surgeon: [name], MD;  Location: [facility] CATH LAB;  Service: Cardiovascular;  Laterality: N/A;   Family History  Problem Relation Age of Onset  . Coronary artery disease Brother 69    2 bros with early CAD  . Pancreatic cancer Father   . Stroke Mother   . Heart disease Father   . Heart disease Brother     x 3  . Hypertension Father   . Hypertension Mother   . Throat cancer Brother    History  Substance Use Topics  . Smoking status: Former Smoker    Types: Cigarettes    Quit date: 05/07/1973  . Smokeless  tobacco: Never Used  . Alcohol Use: No   Current Outpatient Prescriptions  Medication Sig Dispense Refill  . aspirin 81 MG tablet Take 81 mg by mouth daily.    . cholecalciferol (VITAMIN D) 1000 UNITS tablet Take 1,000 Units by mouth daily.    . Coenzyme Q10 (CO Q 10) 100 MG CAPS Take 300 mg by mouth every evening.     . isosorbide mononitrate (IMDUR) 60 MG 24 hr tablet TAKE 1 TABLET (60 MG TOTAL) BY MOUTH DAILY. 90 tablet 2  . losartan (COZAAR) 50 MG tablet TAKE 1 TABLET (50 MG TOTAL) BY MOUTH  DAILY. 90 tablet 3  . metoprolol tartrate (LOPRESSOR) 25 MG tablet TAKE 1 TABLET BY MOUTH 2 TIMES DAILY. PLEASE MAKE APPOINTMENT FOR REFILLS 180 tablet 2  . nitroGLYCERIN (NITROSTAT) 0.4 MG SL tablet Place 0.4 mg under the tongue every 5 (five) minutes as needed for chest pain.    Marland Kitchen omega-3 acid ethyl esters (LOVAZA) 1 G capsule TAKE 2 CAPSULES (2 G TOTAL) BY MOUTH 2 (TWO) TIMES DAILY. 120 capsule 8  . ranolazine (RANEXA) 500 MG 12 hr tablet Take 1 tablet (500 mg total) by mouth 2 (two) times daily. 90 tablet 1  . rosuvastatin (CRESTOR) 40 MG tablet Take 1 tablet (40 mg total) by mouth daily. 30 tablet 6  . ticagrelor (BRILINTA) 90 MG TABS tablet Take 1 tablet (90 mg total) by mouth 2 (two) times daily. 90 tablet 1  . vitamin B-12 (CYANOCOBALAMIN) 100 MCG tablet Take 100 mcg by mouth daily.     . vitamin C (ASCORBIC ACID) 500 MG tablet Take 1,000 mg by mouth daily.     No current facility-administered medications for this visit.   No Known Allergies    Review of Systems: Gen: Denies any fever, chills, sweats, anorexia, fatigue, weakness, malaise, weight loss, and sleep disorder CV: Denies chest pain, angina, palpitations, syncope, orthopnea, PND, peripheral edema, and claudication. Resp: Denies dyspnea at rest, dyspnea with exercise, cough, sputum, wheezing, coughing up blood, and pleurisy. GI: Denies vomiting blood, jaundice, and fecal incontinence.   Denies dysphagia or odynophagia. GU : Denies urinary burning, blood in urine, urinary frequency, urinary hesitancy, nocturnal urination, and urinary incontinence. MS: Denies joint pain, limitation of movement, and swelling, stiffness, low back pain, extremity pain. Denies muscle weakness, cramps, atrophy.  Derm: Denies rash, itching, dry skin, hives, moles, warts, or unhealing ulcers.  Psych: Denies depression, anxiety, memory loss, suicidal ideation, hallucinations, paranoia, and confusion. Heme: Denies bruising, bleeding, and enlarged lymph  nodes. Neuro:  Denies any headaches, dizziness, paresthesia Endo:  Denies any problems with DM, thyroid, adrenal    Physical Exam: General: Pleasant, well developed African-American male in no acute distress Head: Normocephalic and atraumatic Eyes:  sclerae anicteric, conjunctiva pink  Ears: Normal auditory acuity Lungs: Clear throughout to auscultation Heart: Regular rate and rhythm Abdomen: Soft, non distended, non-tender. No masses, no hepatomegaly. Normal bowel sounds Musculoskeletal: Symmetrical with no gross deformities  Extremities: No edema  Neurological: Alert oriented x 4, grossly nonfocal Psychological:  Alert and cooperative. Normal mood and affect  Assessment and Recommendations:  69 year old male with routine risk for colon cancer currently on brilinta, presenting for colonoscopy. Patient has been advised to undergo colonoscopy to evaluate for polyps or neoplasia.The risks, benefits, and alternatives to colonoscopy with possible biopsy and possible polypectomy were discussed with the patient and they consent to proceed. Hold brilinta  5 days before procedure - will instruct when and how to resume after procedure.  Risks and benefits of procedure including bleeding, perforation, infection, missed lesions, medication reactions and possible hospitalization or surgery if complications occur explained. Additional rare but real risk of cardiovascular event such as heart attack or ischemia/infarct of other organs off brilinta explained and need to seek urgent help if this occurs. Will communicate by phone or EMR with patient's prescribing provider that to confirm holding brilinta is reasonable in this case.        Katoria Yetman, Vita Barley PA-C 10/15/2014,  CC: Dr. Rushie Chestnut         Dr. Shelva Majestic

## 2014-10-15 NOTE — Telephone Encounter (Signed)
  10/15/2014   RE: Joshua Hahn DOB: Dec 09, 1945 MRN: [id number]   Dear Dr. Shelva Majestic,    We have scheduled the above patient for an endoscopic procedure. Our records show that he is on anticoagulation therapy.   Please advise as to how long the patient may come off his therapy of Brilinta prior to the procedure, which is scheduled for  12-03-2014.  Please fax back/ or route the completed form to [place] at [phone].   Sincerely,    Cecille Rubin Hvozdovic PA-C

## 2014-10-15 NOTE — Patient Instructions (Signed)
You have been scheduled for a colonoscopy. Please follow written instructions given to you at your visit today.  Please pick up your prep supplies at the pharmacy within the next 1-3 days. [facility]  [address]  If you use inhalers (even only as needed), please bring them with you on the day of your procedure. Your physician has requested that you go to www.startemmi.com and enter the access code given to you at your visit today. This web site gives a general overview about your procedure. However, you should still follow specific instructions given to you by our office regarding your preparation for the procedure.  

## 2014-10-16 ENCOUNTER — Encounter: Payer: Self-pay | Admitting: Physician Assistant

## 2014-10-17 NOTE — Telephone Encounter (Signed)
Can hold brilinta for 5 days prior to procedurwe

## 2014-10-20 ENCOUNTER — Telehealth: Payer: Self-pay | Admitting: *Deleted

## 2014-10-20 ENCOUNTER — Telehealth: Payer: Self-pay | Admitting: Cardiovascular Disease

## 2014-10-20 NOTE — Telephone Encounter (Signed)
Returned cardiac clearance for patient to have eyelid surgery.

## 2014-10-20 NOTE — Telephone Encounter (Signed)
PATIENT STATES HE IS HAVING MINOR SURGERY ON HIS EYELIDS NEXT Wednesday 10/28/14 BY DR Kennedy Bucker.  PATIENT WANTED TO KNOW IF HE NEED TO HOLD ANY MEDICATION FOR PROCEDURE AND FOR HOW LONG. PATIENT AWARE WILL DEFER TO DR Claiborne Billings AND CALL HIM BACK WITH INFORMATION

## 2014-10-20 NOTE — Telephone Encounter (Signed)
this has already been done. Faxed to surgeon on 10/01/14. Confirmed received. Patient notified.

## 2014-10-20 NOTE — Telephone Encounter (Signed)
Pt is going to have minor surgery. Which medicine does he need to stop for surgery?i

## 2014-10-20 NOTE — Telephone Encounter (Signed)
I called and LM for the patient asking him to call me regarding his Brilinta instructions.

## 2014-10-22 NOTE — Telephone Encounter (Signed)
LM with daughter Nickola Major.  She took the [employer] instructions and will give them to her father when her returns later today.  He is to stop the Brilinta on 11-28-14 and resume the Brilinta on 12-04-2014.  She verbalized understanding the instructions.

## 2014-10-27 DIAGNOSIS — H02834 Dermatochalasis of left upper eyelid: Secondary | ICD-10-CM | POA: Diagnosis not present

## 2014-10-27 DIAGNOSIS — H53483 Generalized contraction of visual field, bilateral: Secondary | ICD-10-CM | POA: Diagnosis not present

## 2014-10-27 DIAGNOSIS — H02413 Mechanical ptosis of bilateral eyelids: Secondary | ICD-10-CM | POA: Diagnosis not present

## 2014-10-27 DIAGNOSIS — H02403 Unspecified ptosis of bilateral eyelids: Secondary | ICD-10-CM | POA: Diagnosis not present

## 2014-10-27 DIAGNOSIS — H02831 Dermatochalasis of right upper eyelid: Secondary | ICD-10-CM | POA: Diagnosis not present

## 2014-11-01 ENCOUNTER — Ambulatory Visit (INDEPENDENT_AMBULATORY_CARE_PROVIDER_SITE_OTHER): Payer: Medicare Other | Admitting: Family Medicine

## 2014-11-01 ENCOUNTER — Telehealth: Payer: Self-pay | Admitting: Family Medicine

## 2014-11-01 ENCOUNTER — Encounter: Payer: Self-pay | Admitting: Family Medicine

## 2014-11-01 VITALS — BP 130/72 | HR 66 | Temp 98.0°F | Wt 217.0 lb

## 2014-11-01 DIAGNOSIS — M79605 Pain in left leg: Secondary | ICD-10-CM | POA: Diagnosis not present

## 2014-11-01 NOTE — Telephone Encounter (Signed)
Patient Name: Joshua Hahn DOB: 13-Jun-1945 Initial Comment caller states her husband hurt his leg a few wks ago - now has a knot on his leg and a bruise, his foot is also swollen - has been on blood thinners Nurse Assessment Nurse: Ronnald Ramp, RN, Miranda Date/Time (Eastern Time): 11/01/2014 8:46:06 AM Confirm and document reason for call. If symptomatic, describe symptoms. ---Spoke with pt, states he hurt his left lower leg a couple of weeks ago (18 days ago) and bruising and swelling. Symptoms improved but has been sore and still has "hematoma" at the site. Also, was off his blood thinner for 5 days last week due to eye surgery. Has the patient traveled out of the country within the last 30 days? ---Not Applicable Does the patient require triage? ---Yes Related visit to physician within the last 2 weeks? ---No Does the PT have any chronic conditions? (i.e. diabetes, asthma, etc.) ---Yes List chronic conditions. ---Heart disease, Guidelines Guideline Title Affirmed Question Affirmed Notes Leg Injury [1] After 2 weeks AND [2] still painful or swollen Final Disposition User See PCP When Office is Open (within 3 days) Ronnald Ramp, RN, Miranda Comments Appt scheduled for today at 4:30p with Dr. Yong Channel.

## 2014-11-01 NOTE — Telephone Encounter (Signed)
FYI

## 2014-11-01 NOTE — Progress Notes (Signed)
Garret Reddish, MD  Subjective:  Joshua Hahn is a 69 y.o. year old very pleasant male patient who presents with:  Left leg pain Patient fell off the bed of his pickup truck 18 days ago and slammed his left leg up against the truck while trying to catch himself. Immediate high level pain and states nearly cried. Inner portion of lower leg just distal and medial to knee. Had a large swollen area which he iced and helped some. Pain now resolved. After that, he had some swelling around the ankle and some tightness in the skin that is slowly improving as well. He still has some of that swelling as well as some area under his skin he is concerned is a hematoma. As stated, pain where he originally hit has now resolved. Takes aspirin daily, as well as brilinta. Slowly improving ROS- no erythema, expanding redness, able to walk without difficulty, no fever, chills  Past Medical History- history prostate cancer, CAD with history of CABG and stents, CKD III, HTN, HLD  Medications- reviewed and updated Current Outpatient Prescriptions  Medication Sig Dispense Refill  . aspirin 81 MG tablet Take 81 mg by mouth daily.    . cholecalciferol (VITAMIN D) 1000 UNITS tablet Take 1,000 Units by mouth daily.    . Coenzyme Q10 (CO Q 10) 100 MG CAPS Take 300 mg by mouth every evening.     . isosorbide mononitrate (IMDUR) 60 MG 24 hr tablet TAKE 1 TABLET (60 MG TOTAL) BY MOUTH DAILY. 90 tablet 2  . losartan (COZAAR) 50 MG tablet TAKE 1 TABLET (50 MG TOTAL) BY MOUTH DAILY. 90 tablet 3  . metoprolol tartrate (LOPRESSOR) 25 MG tablet TAKE 1 TABLET BY MOUTH 2 TIMES DAILY. PLEASE MAKE APPOINTMENT FOR REFILLS 180 tablet 2  . omega-3 acid ethyl esters (LOVAZA) 1 G capsule TAKE 2 CAPSULES (2 G TOTAL) BY MOUTH 2 (TWO) TIMES DAILY. 120 capsule 8  . ranolazine (RANEXA) 500 MG 12 hr tablet Take 1 tablet (500 mg total) by mouth 2 (two) times daily. 90 tablet 1  . rosuvastatin (CRESTOR) 40 MG tablet Take 1 tablet (40 mg total)  by mouth daily. 30 tablet 6  . ticagrelor (BRILINTA) 90 MG TABS tablet Take 1 tablet (90 mg total) by mouth 2 (two) times daily. 90 tablet 1  . vitamin B-12 (CYANOCOBALAMIN) 100 MCG tablet Take 100 mcg by mouth daily.     . vitamin C (ASCORBIC ACID) 500 MG tablet Take 1,000 mg by mouth daily.    Marland Kitchen MOVIPREP 100 G SOLR Take 1 kit (200 g total) by mouth as directed. (Patient not taking: Reported on 11/01/2014) 1 kit 0  . nitroGLYCERIN (NITROSTAT) 0.4 MG SL tablet Place 0.4 mg under the tongue every 5 (five) minutes as needed for chest pain.     Objective: BP 130/72 mmHg  Pulse 66  Temp(Src) 98 F (36.7 C)  Wt 217 lb (98.431 kg) Gen: NAD, resting comfortably in chair CV: RRR no murmurs rubs or gallops Lungs: CTAB no crackles, wheeze, rhonchi Abdomen: soft/nontender/nondistended/normal bowel sounds.  Ext: right leg normal Left leg: with 4 x 4 cm area with some fluctuance just distal to fibular head- no pain with palpation, trace pitting edema at ankle with loss of skin lines.  MSK: normal ligamentous exam L knee Skin: warm, dry, no rash Neuro: grossly normal, moves all extremities  Assessment/Plan:  Left leg pain Contusion or hematoma on left lower leg from prior injury. High risk patient on brilinta and  aspirin but area is Slowly improving. No pain in area to suggest need for imaging. Associated swelling noted in lower leg probably a reaction to injury to higher portion of lower leg. Also see AVS. Return precautions given.

## 2014-11-01 NOTE — Patient Instructions (Signed)
I think you have a hematoma that is slowly healing. You can transition to heat. Think this will take probably a month to resolve.   Let me know if you want our sports medicine doc to take a look but with no pain and slowly improving, think we should hold off today.   Take it easy! Stay off the truck.

## 2014-11-01 NOTE — Telephone Encounter (Signed)
FYI  Patient has appointment 11/01/14 at 4:30pm

## 2014-11-22 ENCOUNTER — Other Ambulatory Visit: Payer: Self-pay | Admitting: Cardiovascular Disease

## 2014-11-22 DIAGNOSIS — I2510 Atherosclerotic heart disease of native coronary artery without angina pectoris: Secondary | ICD-10-CM | POA: Diagnosis not present

## 2014-11-22 DIAGNOSIS — E785 Hyperlipidemia, unspecified: Secondary | ICD-10-CM | POA: Diagnosis not present

## 2014-11-22 DIAGNOSIS — I10 Essential (primary) hypertension: Secondary | ICD-10-CM | POA: Diagnosis not present

## 2014-11-22 LAB — COMPREHENSIVE METABOLIC PANEL
ALT: 16 U/L (ref 0–53)
AST: 21 U/L (ref 0–37)
Albumin: 4 g/dL (ref 3.5–5.2)
Alkaline Phosphatase: 61 U/L (ref 39–117)
BUN: 11 mg/dL (ref 6–23)
CO2: 27 meq/L (ref 19–32)
Calcium: 9.9 mg/dL (ref 8.4–10.5)
Chloride: 105 mEq/L (ref 96–112)
Creat: 1.62 mg/dL — ABNORMAL HIGH (ref 0.50–1.35)
Glucose, Bld: 100 mg/dL — ABNORMAL HIGH (ref 70–99)
Potassium: 4.6 mEq/L (ref 3.5–5.3)
SODIUM: 140 meq/L (ref 135–145)
Total Bilirubin: 0.7 mg/dL (ref 0.2–1.2)
Total Protein: 7.1 g/dL (ref 6.0–8.3)

## 2014-11-23 LAB — CBC
HEMATOCRIT: 42.7 % (ref 39.0–52.0)
HEMOGLOBIN: 14.1 g/dL (ref 13.0–17.0)
MCH: 29.5 pg (ref 26.0–34.0)
MCHC: 33 g/dL (ref 30.0–36.0)
MCV: 89.3 fL (ref 78.0–100.0)
MPV: 10.7 fL (ref 8.6–12.4)
PLATELETS: 208 10*3/uL (ref 150–400)
RBC: 4.78 MIL/uL (ref 4.22–5.81)
RDW: 13.1 % (ref 11.5–15.5)
WBC: 6.7 10*3/uL (ref 4.0–10.5)

## 2014-11-24 LAB — NMR LIPOPROFILE WITH LIPIDS
Cholesterol, Total: 137 mg/dL (ref 100–199)
HDL Particle Number: 30.3 umol/L — ABNORMAL LOW (ref >=30.5)
HDL Size: 8.4 nm — ABNORMAL LOW (ref >=9.2)
HDL-C: 43 mg/dL (ref >39)
LDL (calc): 62 mg/dL (ref 0–99)
LDL Particle Number: 1014 nmol/L — ABNORMAL HIGH (ref <1000)
LDL Size: 21.2 nm (ref >=20.8)
LP-IR Score: 50 — ABNORMAL HIGH (ref <=45)
Large HDL-P: <1.3 umol/L — ABNORMAL LOW (ref >=4.8)
Large VLDL-P: 1.3 nmol/L (ref <=2.7)
SMALL LDL PARTICLE NUMBER: 423 nmol/L (ref <=527)
Triglycerides: 160 mg/dL — ABNORMAL HIGH (ref 0–149)
VLDL SIZE: 43.6 nm (ref <=46.6)

## 2014-11-25 ENCOUNTER — Encounter: Payer: Self-pay | Admitting: Cardiovascular Disease

## 2014-11-25 ENCOUNTER — Ambulatory Visit (INDEPENDENT_AMBULATORY_CARE_PROVIDER_SITE_OTHER): Payer: Medicare Other | Admitting: Cardiovascular Disease

## 2014-11-25 VITALS — BP 122/84 | HR 64 | Ht 70.0 in | Wt 218.1 lb

## 2014-11-25 DIAGNOSIS — I10 Essential (primary) hypertension: Secondary | ICD-10-CM

## 2014-11-25 DIAGNOSIS — G4733 Obstructive sleep apnea (adult) (pediatric): Secondary | ICD-10-CM

## 2014-11-25 DIAGNOSIS — Z9861 Coronary angioplasty status: Secondary | ICD-10-CM

## 2014-11-25 DIAGNOSIS — I2510 Atherosclerotic heart disease of native coronary artery without angina pectoris: Secondary | ICD-10-CM

## 2014-11-25 DIAGNOSIS — E782 Mixed hyperlipidemia: Secondary | ICD-10-CM

## 2014-11-25 DIAGNOSIS — N189 Chronic kidney disease, unspecified: Secondary | ICD-10-CM

## 2014-11-25 DIAGNOSIS — Z9989 Dependence on other enabling machines and devices: Secondary | ICD-10-CM

## 2014-11-25 DIAGNOSIS — I25118 Atherosclerotic heart disease of native coronary artery with other forms of angina pectoris: Secondary | ICD-10-CM | POA: Diagnosis not present

## 2014-11-25 DIAGNOSIS — I252 Old myocardial infarction: Secondary | ICD-10-CM

## 2014-11-25 DIAGNOSIS — N183 Chronic kidney disease, stage 3 unspecified: Secondary | ICD-10-CM

## 2014-11-25 NOTE — Patient Instructions (Signed)
Your physician wants you to follow-up in: 6 Months. You will receive a reminder letter in the mail two months in advance. If you don't receive a letter, please call our office to schedule the follow-up appointment.  Your physician recommends that you return for lab work in: 4 Weeks

## 2014-11-25 NOTE — Progress Notes (Signed)
Patient ID: Joshua Hahn, male   DOB: 1945/05/25, 69 y.o.   MRN: [id number]    HPI: Joshua Hahn is a 69 y.o. male who presents for a 6 month follow-up cardiology evaluation.   Rev. Bellew has established coronary artery disease dating back to 1988 at which time I performed PTCA of his distal RCA. 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  In 2010 he developed unstable angina and was found to have significant native CAD with total occlusion of the LAD at its ostium, diffuse AV groove circumflex stenoses, 99% stenosis in a diffusely diseased a 1 vessel with 95% on 2 stenosis. The distal limb of the graft was opacified between OM1-2 but had subtotal occlusion. He had an occluded proximal limb of the graft from the aorta and consequently the entire circumflex territory was in jeopardy. He had native RCA occlusion as well as an occluded graft to the RCA. The RCA was collateralized from the circumflex vessel. He had a patent LIMA graft to the diagonal vessel and patent vein graft to the LAD. On  08/23/2008 he underwent complex intervention to his native circumflex system utilizing high-speed rotational atherectomy was diffusely diseased native circumflex vessel ultimate insertion of a 2.5x28 mm Xience DES stent proximally and 2.25 Taxus stent more distally in tandem.  In January 2013 anuclear perfusion study remained low risk but did show previously noted fixed basal to mid inferior scar. He has a history of prostate CA, status post laparoscopic radical prostatectomy.  Rev. Heinbaugh developed recurrent episodes of chest pain symptoms which led to hospitalization on 02/04/2013. Catheterization revealed a 95% focal  in-stent restenosis in the distal third of the stent in the circumflex vessel and he also had an 85-90% near ostial stenosis in the vein graft supplying the mid LAD. He was hydrated following his catheterization and on up to over 2 2014 underwent two-vessel intervention to the native circumflex vessel for his in-stent restenosis enteroscope/PTCA in the 95% stenosis reduced to 0%, and PTCA/stenting of the ostium in the vein graft supplying the LAD ultimate insertion of a 3.5x15 mm size expedition DES stent was dilated 3.6 mm. The stenosis was reduced to 0%. Subsequently, he has felt significantly improved. He has more energy. He denies shortness of breath with activity. He denies recurrent chest pain  Additional problems include hypertension, mixed hyperlipidemia, obstructive sleep apnea CPAP therapy, prostate cancer, status post radical prostatectomy laparoscopically performed by [name] and left parotid gland surgery. I last saw him I stressed the importance that he continue to use his CPAP therapy with 100% compliant. I also reinitiated ARB therapy with losartan, although the past he had been on Diovan but due to cost issues his insurance company would no longer cover this.   An NMR profile in 2014 shpwed significant improvement with a previous LDL particle #2733  being reduced to 1491. Calculated LDL cholesterol was reduced from 170 to 91. Triglycerides although improved from 289 to 215 were still elevated. Small particles were markedly reduced but still elevated at 859, where previously these had been 1823. Insulin resistance were still elevated at 64.  Since I last saw him 6 months ago, he feels that he has been doing exceptionally well.  He denies any chest pain or shortness of breath.  He feels that he has had the most energy that he has had over the last several years.  He now admits to 100% CPAP compliance.  He oftentimes is working up to 12 hours per day and denies any hypersomnolence or daytime  fatigue.  He is unaware of any palpitations.  He continues to exercise.  Occasionally, he notes some trivial ankle edema.  He denies excessive bleeding.  He presents for evaluation.  Past Medical History  Diagnosis Date  . 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 levels LDL IIIa+b% and HDL2b%  . Coronary artery disease 1994    PCI'80s, CABG '94, CFX DES 4/10  . HTN (hypertension) 10/16/2011    Echo -EF 50-55% ,LV NORMAL  . Sleep apnea, obstructive     using C-PAP  . Myocardial infarction 1994    infer[facility] wall [facility]  . Diverticulosis   . Prostate cancer     Past Surgical History  Procedure Laterality Date  . Coronary artery bypass graft  1994  . Cardiac catheterization  06/23/2008    occlusion sequential OM1 andOM2 graft as well vein graft to RCA.LAD and diagonal system remain intact. RCA  collateralized  . Percutaneous coronary stent intervention (pci-s)  06/25/2008    rotational atherectomy w/diffuse disease native circ. w/drug eluting stents , PTCA on one vessel; grat occlusion to RCA w/native RCA proxim. occlusion. Circ.supplied collaaterals distal RCA  following intervention  . Left parotid gland surgry  65/7846    Dr. Erik Obey  . Prostatectomy  06/2009    robotic-asssisted laparoscopic radical by Dr Rosana Hoes  . Exploratory laparotomy      ideopathic retroperitoneal fibrosis  . Left heart catheterization with coronary/graft angiogram N/A 02/04/2013    Procedure: LEFT HEART CATHETERIZATION WITH Beatrix Fetters;  Surgeon: [name], MD;  Location: [facility] CATH LAB;  Service: Cardiovascular;  Laterality: N/A;  . Percutaneous coronary stent intervention (pci-s) N/A 02/05/2013    Procedure: PERCUTANEOUS CORONARY STENT INTERVENTION (PCI-S);  Surgeon: [name], MD;  Location: [facility] CATH LAB;  Service: Cardiovascular;  Laterality: N/A;    No Known Allergies  Current Outpatient  Prescriptions  Medication Sig Dispense Refill  . aspirin 81 MG tablet Take 81 mg by mouth daily.    . cholecalciferol (VITAMIN D) 1000 UNITS tablet Take 1,000 Units by mouth daily.    . Coenzyme Q10 (CO Q 10) 100 MG CAPS Take 300 mg by mouth every evening.     . isosorbide mononitrate (IMDUR) 60 MG 24 hr tablet TAKE 1 TABLET (60 MG TOTAL) BY MOUTH DAILY. 90 tablet 2  . losartan (COZAAR) 50 MG tablet TAKE 1 TABLET (50 MG TOTAL) BY MOUTH DAILY. 90 tablet 3  . metoprolol tartrate (LOPRESSOR) 25 MG tablet TAKE 1 TABLET BY MOUTH 2 TIMES DAILY. PLEASE MAKE APPOINTMENT FOR REFILLS 180 tablet 2  . nitroGLYCERIN (NITROSTAT) 0.4 MG SL tablet Place 0.4 mg under the tongue every 5 (five) minutes as needed for chest pain.    Marland Kitchen omega-3 acid ethyl esters (LOVAZA) 1 G capsule TAKE 2 CAPSULES (2 G TOTAL) BY MOUTH 2 (TWO) TIMES DAILY. 120 capsule 8  . ranolazine (RANEXA) 500 MG 12 hr tablet Take 1 tablet (500 mg total) by mouth 2 (two) times daily. 90 tablet 1  . rosuvastatin (CRESTOR) 40 MG tablet Take 1 tablet (40 mg total) by mouth daily. Cuartelez  tablet 6  . ticagrelor (BRILINTA) 90 MG TABS tablet Take 1 tablet (90 mg total) by mouth 2 (two) times daily. 90 tablet 1  . vitamin B-12 (CYANOCOBALAMIN) 100 MCG tablet Take 100 mcg by mouth daily.     . vitamin C (ASCORBIC ACID) 500 MG tablet Take 1,000 mg by mouth daily.     No current facility-administered medications for this visit.    History   Social History  . Marital Status: Married    Spouse Name: N/A  . Number of Children: 2  . Years of Education: N/A   Occupational History  . pastor    Social History Main Topics  . Smoking status: Former Smoker    Types: Cigarettes    Quit date: 05/07/1973  . Smokeless tobacco: Never Used  . Alcohol Use: No  . Drug Use: No  . Sexual Activity: Not on file   Other Topics Concern  . Not on file   Social History Narrative   Married 48 years in 2016. 2 children. 5 grand kids.       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       Hobbies: speaking events, traveling   Socially he is married. He has 2 children 5 grandchildren. 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 There is no tobacco use. He does travel. He was planning to go to [place] on a [place] trip that this will be deferred presently.  Family History  Problem Relation Age of Onset  . Coronary artery disease Brother 40    2 bros with early CAD  . Pancreatic cancer Father   . Stroke Mother   . Heart disease Father   . Heart disease Brother     x 3  . Hypertension Father   . Hypertension Mother   . Throat cancer Brother     ROS General: Negative; No fevers, chills, or night sweats;  HEENT: Negative; No changes in vision or hearing, sinus congestion, difficulty swallowing Pulmonary: Negative; No cough, wheezing, shortness of breath, hemoptysis Cardiovascular: Negative; No chest pain, presyncope, syncope, palpitations GI: Negative; No nausea, vomiting, diarrhea, or abdominal pain GU: Negative; No dysuria, hematuria, or difficulty voiding Musculoskeletal: Negative; no myalgias, joint pain, or weakness Hematologic/Oncology: Negative; no easy bruising, bleeding Endocrine: Negative; no heat/cold intolerance; no diabetes Neuro: Negative; no changes in balance, headaches Skin: Negative; No rashes or skin lesions Psychiatric: Negative; No behavioral problems, depression Sleep: Positive for OSA on CPAP.  No snoring, daytime sleepiness, hypersomnolence, bruxism, restless legs, hypnogognic hallucinations, no cataplexy Other comprehensive 14 point system review is negative.  PE BP 122/84 mmHg  Pulse 64  Ht _0  (1.778 m)  Wt 218 lb 1.6 oz (98.93 kg)  BMI 31.29 kg/m2   Wt Readings from Last 3 Encounters:  11/25/14 218 lb 1.6 oz (98.93 kg)  11/01/14 217 lb (98.431 kg)  10/15/14 214 lb 3.2 oz (97.16 kg)    General: Alert, oriented, no distress.  Skin: normal turgor, no rashes HEENT: Normocephalic, atraumatic. Pupils round and reactive; sclera anicteric;no lid lag.  Nose without nasal septal hypertrophy Mouth/Parynx benign; Mallinpatti scale 3 Neck: No JVD, no carotid bruits with normal carotid upstroke. Lungs: clear to ausculatation and percussion; no wheezing or rales Chest wall: Nontender to palpation Heart: RRR, s1 s2 normal  0-3/5 systolic murmur; no S3 gallop.  No diastolic murmur.  No rubs thrills or heaves. Abdomen: soft, nontender; no hepatosplenomehaly, BS+; abdominal aorta nontender and not dilated by palpation. Pulses 2+ groin catheterization site stable. No ecchymoses or hematoma. Extremities: no clubbing cyanosis or edema, Homan's sign negative  Back: No CVA tenderness Neurologic: grossly nonfocal Psychological: Normal affect and mood; normal cognition  ECG (independently read by me): Normal sinus rhythm at 64 bpm.  Nondiagnostic T-wave changes inferolaterally.  First-degree AV block with a PR interval at 238 ms.  ECG (independently read by me): Normal sinus rhythm at 60 bpm.  First-degree AV block with a PR interval at 236 ms.  Nonspecific ST changes  December 2014 ECG: Sinus rhythm at 61 beats per minute. Previously old Q wave in 3. Nondiagnostic ST changes   LABS: BMP Latest Ref Rng 11/22/2014 04/13/2013 02/07/2013  Glucose 70 - 99 mg/dL 100(H) 84 114(H)  BUN 6 - 23 mg/dL _0 Creatinine 0.50 - 1.35 mg/dL 1.62(H) 1.40(H) 1.55(H)  Sodium 135 - 145 mEq/L 140 136 134(L)  Potassium 3.5 - 5.3 mEq/L 4.6 4.7 4.1  Chloride 96 - 112 mEq/L 105 99 99  CO2 19 - 32 mEq/L _1 Calcium 8.4 - 10.5 mg/dL 9.9 10.2 9.5   Hepatic Function Latest Ref Rng 11/22/2014 04/13/2013 01/09/2013  Total Protein 6.0 - 8.3 g/dL 7.1 7.5 7.0  Albumin 3.5 - 5.2 g/dL 4.0 4.4 4.2  AST 0 - 37 U/L _2 ALT 0 - 53 U/L _3 Alk Phosphatase 39 - 117 U/L 61 60 56  Total Bilirubin 0.2 - 1.2  mg/dL 0.7 0.9 0.7  Bilirubin, Direct 0.0 - 0.3 mg/dL - - -   CBC Latest Ref Rng 11/22/2014 04/13/2013 02/07/2013  WBC 4.0 - 10.5 K/uL 6.7 8.2 9.3  Hemoglobin 13.0 - 17.0 g/dL 14.1 15.0 15.0  Hematocrit 39.0 - 52.0 % 42.7 43.9 42.4  Platelets 150 - 400 K/uL 208 205 181   Lab Results  Component Value Date   MCV 89.3 11/22/2014   MCV 88.7 04/13/2013   MCV 88.3 02/07/2013   Lab Results  Component Value Date   TSH 2.137 01/09/2013   Lab Results  Component Value Date   HGBA1C * 10/23/2009    6.4 (NOTE)                                                                       According to the ADA Clinical Practice Recommendations for 2011, when HbA1c is used as a screening test:   >=6.5%   Diagnostic of Diabetes Mellitus           (if abnormal result  is confirmed)  5.7-6.4%   Increased risk of developing Diabetes Mellitus  References:Diagnosis and Classification of Diabetes Mellitus,Diabetes PIRJ,1884,16(SAYTK 1):S62-S69 and Standards of Medical Care in         Diabetes - 2011,Diabetes Care,2011,34  (Suppl 1):S11-S61.   Lipid Panel     Component Value Date/Time   CHOL 137 11/22/2014 1117   CHOL 205* 02/27/2012 1718   TRIG 160* 11/22/2014 1117   TRIG 230.0* 02/27/2012 1718   TRIG 216* 03/04/2006 0946   HDL 43 11/22/2014 1117   HDL 39.40 02/27/2012 1718   CHOLHDL 5 02/27/2012 1718  CHOLHDL 6.3 CALC 03/04/2006 0946   VLDL 46.0* 02/27/2012 1718   LDLCALC 62 11/22/2014 1117   LDLCALC  10/24/2009 0450    UNABLE TO CALCULATE IF TRIGLYCERIDE OVER 400 mg/dL        Total Cholesterol/HDL:CHD Risk Coronary Heart Disease Risk Table                     Men   Women  1/2 Average Risk   3.4   3.3  Average Risk       5.0   4.4  2 X Average Risk   9.6   7.1  3 X Average Risk  23.4   11.0        Use the calculated Patient Ratio above and the CHD Risk Table to determine the patient's CHD Risk.        ATP III CLASSIFICATION (LDL):  <100     mg/dL   Optimal  100-129  mg/dL   Near or Above                     Optimal  130-159  mg/dL   Borderline  160-189  mg/dL   High  >190     mg/dL   Very High   LDLDIRECT 135.5 02/27/2012 1718   LDLDIRECT 143.1 03/04/2006 0946     RADIOLOGY: No results found.    ASSESSMENT AND PLAN: Rev. Windmiller is a 69 year old African-American male who is 28 years following his initial PTCA of his distal RCA and 21 years following his inferior wall myocardial infarction and subsequent CABG revascularization surgery. He is status post complex rotational atherectomy and diffuse stenting of his native circumflex vessel which also supplied his RCA territory. He  developed unstable anginal symptoms in October 2014 and had mild positive troponin ruling in for non-ST segment MI. Catheterization done 02/04/2013 showed focal 95% in-stent restenosis in the distal third of the previously placed stent in the circumflex vessel. This stenosis occurred after the collateral was aeration of the circumflex supplying the distal right coronary artery. He also had high grade 85-90% near ostial stenosis in the vein graft supplying the LAD. Fortunately he underwent successful two-vessel coronary intervention with cutting balloon angiosculpt to the circumflex in-stent restenosis, and PTCA/insertion of a new DES stent in the ostium of the vein graft supplying the LAD.  Presently, his blood pressure is controlled on his current regimen consisting of Lopressor 25 mg twice a day, losartan 50 mg daily in addition to his isosorbide mononitrate.  His most recent laboratory has shown mild renal insufficiency.  His creatinine had risen to 1.6-3 days ago from 1.40 in December 2014 and 1.55 in October 2014.  I will repeat his be met in approximate 3-4 weeks.  If his creatinine is further increase it may be necessary to reduce his losartan dose.  His lipid studies are markedly improved from one year ago and on recent NMR LipoProfile his LDL particle number was reduced to 1014 from a previous LDL particle  number of 2733.  He is tolerating Crestor 40 mg daily.  He also takes coenzyme Q10.  He denies myalgias.  He now has more energy.  He denies residual daytime sleepiness.  I suspect this is significantly contributed by his 100% CPAP compliance.  He is not having bleeding issues and is tolerating dual antiplatelets therapy.  There is trivial ankle edema.  Regarding his repeat chemistry profile in one month.  As long as he remains stable I'll  see him in 6 months for reevaluation.  Time spent: 25 minutes  [name], MD, [facility]  11/25/2014 6:05 PM

## 2014-12-02 ENCOUNTER — Telehealth: Payer: Self-pay | Admitting: Gastroenterology

## 2014-12-02 MED ORDER — MOVIPREP 100 G PO SOLR: 1.0000 | Freq: Once | ORAL | Status: DC

## 2014-12-02 NOTE — Telephone Encounter (Signed)
rx sent to the pharmacy. 

## 2014-12-03 ENCOUNTER — Ambulatory Visit (AMBULATORY_SURGERY_CENTER): Payer: Medicare Other | Admitting: Gastroenterology

## 2014-12-03 ENCOUNTER — Encounter: Payer: Self-pay | Admitting: Gastroenterology

## 2014-12-03 VITALS — BP 103/58 | HR 54 | Temp 97.3°F | Resp 20 | Ht 69.25 in | Wt 214.0 lb

## 2014-12-03 DIAGNOSIS — D124 Benign neoplasm of descending colon: Secondary | ICD-10-CM | POA: Diagnosis not present

## 2014-12-03 DIAGNOSIS — Z1211 Encounter for screening for malignant neoplasm of colon: Secondary | ICD-10-CM

## 2014-12-03 DIAGNOSIS — G4733 Obstructive sleep apnea (adult) (pediatric): Secondary | ICD-10-CM | POA: Diagnosis not present

## 2014-12-03 DIAGNOSIS — D122 Benign neoplasm of ascending colon: Secondary | ICD-10-CM | POA: Diagnosis not present

## 2014-12-03 DIAGNOSIS — I2510 Atherosclerotic heart disease of native coronary artery without angina pectoris: Secondary | ICD-10-CM | POA: Diagnosis not present

## 2014-12-03 DIAGNOSIS — K5730 Diverticulosis of large intestine without perforation or abscess without bleeding: Secondary | ICD-10-CM

## 2014-12-03 MED ORDER — SODIUM CHLORIDE 0.9 % IV SOLN: 500.0000 mL | INTRAVENOUS | Status: DC

## 2014-12-03 NOTE — Progress Notes (Signed)
Report to PACU, RN, vss, BBS= Clear.  

## 2014-12-03 NOTE — Patient Instructions (Signed)
YOU HAD AN ENDOSCOPIC PROCEDURE TODAY AT [facility]:   Refer to the procedure report that was given to you for any specific questions about what was found during the examination.  If the procedure report does not answer your questions, please call your gastroenterologist to clarify.  If you requested that your care partner not be given the details of your procedure findings, then the procedure report has been included in a sealed envelope for you to review at your convenience later.  YOU SHOULD EXPECT: Some feelings of bloating in the abdomen. Passage of more gas than usual.  Walking can help get rid of the air that was put into your GI tract during the procedure and reduce the bloating. If you had a lower endoscopy (such as a colonoscopy or flexible sigmoidoscopy) you may notice spotting of blood in your stool or on the toilet paper. If you underwent a bowel prep for your procedure, you may not have a normal bowel movement for a few days.  Please Note:  You might notice some irritation and congestion in your nose or some drainage.  This is from the oxygen used during your procedure.  There is no need for concern and it should clear up in a day or so.  SYMPTOMS TO REPORT IMMEDIATELY:   Following lower endoscopy (colonoscopy or flexible sigmoidoscopy):  Excessive amounts of blood in the stool  Significant tenderness or worsening of abdominal pains  Swelling of the abdomen that is new, acute  Fever of 100F or higher   For urgent or emergent issues, a gastroenterologist can be reached at any hour by calling [phone].   DIET: Your first meal following the procedure should be a small meal and then it is ok to progress to your normal diet. Heavy or fried foods are harder to digest and may make you feel nauseous or bloated.  Likewise, meals heavy in dairy and vegetables can increase bloating.  Drink plenty of fluids but you should avoid alcoholic beverages for 24  hours.  ACTIVITY:  You should plan to take it easy for the rest of today and you should NOT DRIVE or use heavy machinery until tomorrow (because of the sedation medicines used during the test).    FOLLOW UP: Our staff will call the number listed on your records the next business day following your procedure to check on you and address any questions or concerns that you may have regarding the information given to you following your procedure. If we do not reach you, we will leave a message.  However, if you are feeling well and you are not experiencing any problems, there is no need to return our call.  We will assume that you have returned to your regular daily activities without incident.  If any biopsies were taken you will be contacted by phone or by letter within the next 1-3 weeks.  Please call us at [phone] if you have not heard about the biopsies in 3 weeks.    SIGNATURES/CONFIDENTIALITY: You and/or your care partner have signed paperwork which will be entered into your electronic medical record.  These signatures attest to the fact that that the information above on your After Visit Summary has been reviewed and is understood.  Full responsibility of the confidentiality of this discharge information lies with you and/or your care-partner.  Polyps/diverticulosis handout given Await pathology report

## 2014-12-03 NOTE — Progress Notes (Signed)
Pt stopped Brilinta on 11-30-14.  Per anti-coagulation letter, pt was to stop 5 days before procedure.  I asked pt about this and he states his cardiologist told him "3 days was enough to be off of medicine."  I made Dr. Ardis Hughs aware of this.

## 2014-12-03 NOTE — Progress Notes (Signed)
Called to room to assist during endoscopic procedure.  Patient ID and intended procedure confirmed with present staff. Received instructions for my participation in the procedure from the performing physician.  

## 2014-12-03 NOTE — Op Note (Signed)
[place]  [address] [place], [id number]   COLONOSCOPY PROCEDURE REPORT  PATIENT: Braeson, Rupe  MR#: [id number] BIRTHDATE: 15-Feb-1946 , 22  yrs. old GENDER: male ENDOSCOPIST: Milus Banister, MD PROCEDURE DATE:  12/03/2014 PROCEDURE:   Colonoscopy, screening and Colonoscopy with snare polypectomy First Screening Colonoscopy - Avg.  risk and is 50 yrs.  old or older - No.  Prior Negative Screening - Now for repeat screening. 10 or more years since last screening  History of Adenoma - Now for follow-up colonoscopy & has been > or = to 3 yrs.  N/A  Polyps removed today? Yes ASA CLASS:   Class II INDICATIONS:Screening for colonic neoplasia, Colorectal Neoplasm Risk Assessment for this procedure is average risk, and Colonoscopy Dr.  Velora Heckler 2005 was normal. MEDICATIONS: Monitored anesthesia care and Propofol 220 mg IV  DESCRIPTION OF PROCEDURE:   After the risks benefits and alternatives of the procedure were thoroughly explained, informed consent was obtained.  The digital rectal exam revealed no abnormalities of the rectum.   The LB [id number]  endoscope was introduced through the anus and advanced to the cecum, which was identified by both the appendix and ileocecal valve. No adverse events experienced.   The quality of the prep was excellent.  The instrument was then slowly withdrawn as the colon was fully examined. Estimated blood loss is zero unless otherwise noted in this procedure report.  COLON FINDINGS: Two sessile polyps ranging between 3-6mm in size were found in the descending colon and ascending colon. Polypectomies were performed with a cold snare.  The resection was complete, the polyp tissue was completely retrieved and sent to histology.   There was mild diverticulosis noted in the left colon. The examination was otherwise normal.  Retroflexed views revealed no abnormalities. The time to cecum = 2.6 Withdrawal time = 8.4 The scope  was withdrawn and the procedure completed. COMPLICATIONS: There were no immediate complications.  ENDOSCOPIC IMPRESSION: 1.  Two sessile polyps ranging between 3-67mm in size were found in the descending colon and ascending colon; polypectomies were performed with a cold snare 2.   Mild diverticulosis was noted in the left colon 3.   The examination was otherwise normal  RECOMMENDATIONS: 1. If the polyp(s) removed today are proven to be adenomatous (pre-cancerous) polyps, you will need a repeat colonoscopy in 5 years.  Otherwise you should continue to follow colorectal cancer screening guidelines for "routine risk" patients with colonoscopy in 10 years.  You will receive a letter within 1-2 weeks with the results of your biopsy as well as final recommendations.  Please call my office if you have not received a letter after 3 weeks. 2. You can restart your Brilinta today  eSigned:  Milus Banister, MD 12/03/2014 10:32 AM revise   PATIENT NAME:  Xaden, Kaufman MR#: [id number]

## 2014-12-06 ENCOUNTER — Telehealth: Payer: Self-pay | Admitting: *Deleted

## 2014-12-06 NOTE — Telephone Encounter (Signed)
  Follow up Call-  Call back number 12/03/2014  Post procedure Call Back phone  # [phone]  Permission to leave phone message Yes     Patient questions:  Do you have a fever, pain , or abdominal swelling? No. Pain Score  0 *  Have you tolerated food without any problems? Yes.    Have you been able to return to your normal activities? Yes.    Do you have any questions about your discharge instructions: Diet   No. Medications  No. Follow up visit  No.  Do you have questions or concerns about your Care? No.  Actions: * If pain score is 4 or above: No action needed, pain <4.

## 2014-12-09 ENCOUNTER — Encounter: Payer: Self-pay | Admitting: Gastroenterology

## 2014-12-10 ENCOUNTER — Encounter: Payer: Self-pay | Admitting: Family Medicine

## 2014-12-10 DIAGNOSIS — Z86010 Personal history of colonic polyps: Secondary | ICD-10-CM | POA: Insufficient documentation

## 2014-12-14 ENCOUNTER — Other Ambulatory Visit: Payer: Self-pay | Admitting: Cardiovascular Disease

## 2014-12-14 NOTE — Telephone Encounter (Signed)
Rx(s) sent to pharmacy electronically.  

## 2014-12-28 ENCOUNTER — Other Ambulatory Visit: Payer: Self-pay | Admitting: Cardiovascular Disease

## 2014-12-28 NOTE — Telephone Encounter (Signed)
REFILL 

## 2015-02-11 ENCOUNTER — Other Ambulatory Visit: Payer: Self-pay | Admitting: Cardiovascular Disease

## 2015-02-11 NOTE — Telephone Encounter (Signed)
Rx request sent to pharmacy.  

## 2015-05-17 ENCOUNTER — Other Ambulatory Visit: Payer: Self-pay | Admitting: Cardiovascular Disease

## 2015-05-17 NOTE — Telephone Encounter (Signed)
Rx(s) sent to pharmacy electronically.  

## 2015-06-12 ENCOUNTER — Other Ambulatory Visit: Payer: Self-pay | Admitting: Cardiovascular Disease

## 2015-06-13 NOTE — Telephone Encounter (Signed)
Rx request sent to pharmacy.  

## 2015-06-16 ENCOUNTER — Other Ambulatory Visit: Payer: Self-pay | Admitting: Cardiovascular Disease

## 2015-06-16 NOTE — Telephone Encounter (Signed)
Rx(s) sent to pharmacy electronically.  

## 2015-07-09 ENCOUNTER — Other Ambulatory Visit: Payer: Self-pay | Admitting: Cardiovascular Disease

## 2015-07-10 ENCOUNTER — Other Ambulatory Visit: Payer: Self-pay | Admitting: Cardiovascular Disease

## 2015-07-11 NOTE — Telephone Encounter (Signed)
Rx(s) sent to pharmacy electronically. OV 4/14

## 2015-07-22 DIAGNOSIS — H2513 Age-related nuclear cataract, bilateral: Secondary | ICD-10-CM | POA: Diagnosis not present

## 2015-07-22 DIAGNOSIS — H40023 Open angle with borderline findings, high risk, bilateral: Secondary | ICD-10-CM | POA: Diagnosis not present

## 2015-07-22 DIAGNOSIS — H25013 Cortical age-related cataract, bilateral: Secondary | ICD-10-CM | POA: Diagnosis not present

## 2015-07-22 DIAGNOSIS — H1013 Acute atopic conjunctivitis, bilateral: Secondary | ICD-10-CM | POA: Diagnosis not present

## 2015-07-25 ENCOUNTER — Emergency Department (HOSPITAL_BASED_OUTPATIENT_CLINIC_OR_DEPARTMENT_OTHER)
Admission: EM | Admit: 2015-07-25 | Discharge: 2015-07-25 | Disposition: A | Discharge status: Home / Self Care | Payer: Medicare Other | Attending: Emergency Medicine | Admitting: Emergency Medicine

## 2015-07-25 ENCOUNTER — Emergency Department (HOSPITAL_BASED_OUTPATIENT_CLINIC_OR_DEPARTMENT_OTHER): Payer: Medicare Other

## 2015-07-25 ENCOUNTER — Encounter (HOSPITAL_BASED_OUTPATIENT_CLINIC_OR_DEPARTMENT_OTHER): Payer: Self-pay

## 2015-07-25 DIAGNOSIS — I10 Essential (primary) hypertension: Secondary | ICD-10-CM | POA: Insufficient documentation

## 2015-07-25 DIAGNOSIS — R509 Fever, unspecified: Secondary | ICD-10-CM | POA: Diagnosis not present

## 2015-07-25 DIAGNOSIS — I252 Old myocardial infarction: Secondary | ICD-10-CM | POA: Diagnosis not present

## 2015-07-25 DIAGNOSIS — Z9861 Coronary angioplasty status: Secondary | ICD-10-CM | POA: Diagnosis not present

## 2015-07-25 DIAGNOSIS — Z87891 Personal history of nicotine dependence: Secondary | ICD-10-CM | POA: Diagnosis not present

## 2015-07-25 DIAGNOSIS — J069 Acute upper respiratory infection, unspecified: Secondary | ICD-10-CM | POA: Diagnosis not present

## 2015-07-25 DIAGNOSIS — R05 Cough: Secondary | ICD-10-CM | POA: Diagnosis not present

## 2015-07-25 HISTORY — DX: Acute myocardial infarction, unspecified: I21.9

## 2015-07-25 LAB — CBC
HCT: 41.8 % (ref 39.0–52.0)
HEMOGLOBIN: 13.8 g/dL (ref 13.0–17.0)
MCH: 29.9 pg (ref 26.0–34.0)
MCHC: 33 g/dL (ref 30.0–36.0)
MCV: 90.7 fL (ref 78.0–100.0)
PLATELETS: 172 10*3/uL (ref 150–400)
RBC: 4.61 MIL/uL (ref 4.22–5.81)
RDW: 13.2 % (ref 11.5–15.5)
WBC: 7.9 10*3/uL (ref 4.0–10.5)

## 2015-07-25 LAB — BASIC METABOLIC PANEL
ANION GAP: 6 (ref 5–15)
BUN: 13 mg/dL (ref 6–20)
CHLORIDE: 105 mmol/L (ref 101–111)
CO2: 23 mmol/L (ref 22–32)
Calcium: 8.7 mg/dL — ABNORMAL LOW (ref 8.9–10.3)
Creatinine, Ser: 1.97 mg/dL — ABNORMAL HIGH (ref 0.61–1.24)
GFR, EST AFRICAN AMERICAN: 38 mL/min — AB (ref >60)
GFR, EST NON AFRICAN AMERICAN: 33 mL/min — AB (ref >60)
Glucose, Bld: 118 mg/dL — ABNORMAL HIGH (ref 65–99)
POTASSIUM: 4.2 mmol/L (ref 3.5–5.1)
SODIUM: 134 mmol/L — AB (ref 135–145)

## 2015-07-25 MED ORDER — KETOROLAC TROMETHAMINE 30 MG/ML IJ SOLN
30.0000 mg | Freq: Once | INTRAMUSCULAR | Status: AC
  Administered 2015-07-25: 30 mg via INTRAVENOUS
  Filled 2015-07-25: qty 1

## 2015-07-25 MED ORDER — OSELTAMIVIR PHOSPHATE 75 MG PO CAPS
75.0000 mg | ORAL_CAPSULE | Freq: Once | ORAL | Status: AC
  Administered 2015-07-25: 75 mg via ORAL
  Filled 2015-07-25: qty 1

## 2015-07-25 MED ORDER — OSELTAMIVIR PHOSPHATE 75 MG PO CAPS: 75.0000 mg | ORAL_CAPSULE | Freq: Two times a day (BID) | ORAL | Status: DC

## 2015-07-25 MED ORDER — ACETAMINOPHEN 325 MG PO TABS
650.0000 mg | ORAL_TABLET | Freq: Once | ORAL | Status: AC
  Administered 2015-07-25: 650 mg via ORAL
  Filled 2015-07-25: qty 2

## 2015-07-25 MED ORDER — BUTALBITAL-APAP-CAFFEINE 50-325-40 MG PO TABS
1.0000 | ORAL_TABLET | Freq: Once | ORAL | Status: DC
  Filled 2015-07-25: qty 1

## 2015-07-25 MED ORDER — SODIUM CHLORIDE 0.9 % IV BOLUS (SEPSIS)
1000.0000 mL | Freq: Once | INTRAVENOUS | Status: AC
  Administered 2015-07-25: 1000 mL via INTRAVENOUS

## 2015-07-25 NOTE — ED Provider Notes (Signed)
CSN: [id number]     Arrival date & time 07/25/15  1858 History   First MD Initiated Contact with Patient 07/25/15 2038     Chief Complaint  Patient presents with  . Cough     HPI   70 year old male presenting for congestion, cough, fever for 2 days. Patient reports that 2 days ago he developed a scratchy throat which progressed to cough. He developed fever last night to a Tmax of 102. He took tylenol at that time. He also reports headache with gradual onset that started 1 hour prior to coming to the ED.  He denies chest pain, SOB, weakness, changes in vision, N/V/D  Past Medical History  Diagnosis Date  . Myocardial infarct ([place])   . Hypertension    Past Surgical History  Procedure Laterality Date  . Coronary angioplasty with stent placement    . Abdominal surgery     No family history on file. Social History  Substance Use Topics  . Smoking status: Former Research scientist (life sciences)  . Smokeless tobacco: None  . Alcohol Use: No    Review of Systems  Constitutional: Negative.   HENT: Positive for congestion. Negative for dental problem, drooling, ear discharge, ear pain, facial swelling, hearing loss, mouth sores, nosebleeds, postnasal drip, rhinorrhea, sinus pressure, sneezing, sore throat, tinnitus, trouble swallowing and voice change.   Eyes: Negative.   Respiratory: Positive for cough. Negative for apnea, choking, shortness of breath, wheezing and stridor.   Cardiovascular: Negative.   Gastrointestinal: Negative.   Genitourinary: Negative.   Musculoskeletal: Negative.   Skin: Negative.       Allergies  Review of patient's allergies indicates no known allergies.  Home Medications   Prior to Admission medications   Medication Sig Start Date End Date Taking? Authorizing Provider  oseltamivir (TAMIFLU) 75 MG capsule Take 1 capsule (75 mg total) by mouth 2 (two) times daily. 07/25/15   Ramadan Couey A Havanah Nelms, MD   BP 120/61 mmHg  Pulse 85  Temp(Src) 98.3 F (36.8 C) (Oral)  Resp 20  Ht 5'  10" (1.778 m)  Wt 92.987 kg  BMI 29.41 kg/m2  SpO2 93% Physical Exam  Constitutional: He is oriented to person, place, and time. He appears well-developed and well-nourished.  HENT:  Head: Normocephalic and atraumatic.  Eyes: Conjunctivae and EOM are normal. Pupils are equal, round, and reactive to light.  Neck: Normal range of motion.  Cardiovascular: Normal rate, regular rhythm and normal heart sounds.   Pulmonary/Chest: Effort normal and breath sounds normal. No respiratory distress. He has no wheezes.  Abdominal: Bowel sounds are normal. He exhibits no distension. There is no tenderness.  Musculoskeletal: Normal range of motion.  Neurological: He is alert and oriented to person, place, and time.  Skin: Skin is warm and dry.    ED Course  Procedures (including critical care time) Labs Review Labs Reviewed  BASIC METABOLIC PANEL - Abnormal; Notable for the following:    Sodium 134 (*)    Glucose, Bld 118 (*)    Creatinine, Ser 1.97 (*)    Calcium 8.7 (*)    GFR calc non Af Amer 33 (*)    GFR calc Af Amer 38 (*)    All other components within normal limits  CBC    Imaging Review Dg Chest 2 View  07/25/2015  CLINICAL DATA:  Productive cough with fever, headaches and central chest pain for 2 days. History of MI and hypertension. EXAM: CHEST  2 VIEW COMPARISON:  None. FINDINGS: The heart size  is at the upper limits of normal status post median sternotomy and CABG. There is probable mild patchy atelectasis at both lung bases. No edema, confluent airspace opacity or pleural effusion is seen. The bones appear unremarkable. IMPRESSION: Probable mild basilar atelectasis post CABG. No confluent airspace opacity to suggest pneumonia. Electronically Signed   By: Richardean Sale M.D.   On: 07/25/2015 19:31   I have personally reviewed and evaluated these images and lab results as part of my medical decision-making.   EKG Interpretation None      MDM   Final diagnoses:  URI (upper  respiratory infection)    70 y/o M presenting for cough, congestion and fever. Chest xray was normal however he was febrile on presentation. After receiving tylenol, tramadol and IVF, his headache improved, his fever resolved and he reported feeling better. Given his age and co-morbidities he was was treated with Tamiflu for suspected influenza. He was discharged with return precautions and counseled to follow with his PCP with ine the next week.   Daiden Coltrane A. Lincoln Brigham MD, [facility] Resident PGY-2 Pager [phone]     Veatrice Bourbon, MD 07/25/15 [id number]  Leonard Schwartz, MD 07/26/15 [phone]

## 2015-07-25 NOTE — ED Notes (Signed)
MD at bedside. 

## 2015-07-25 NOTE — ED Notes (Addendum)
C/o prod cough, fever, HA x 2 days-NAD-steady gait

## 2015-07-26 ENCOUNTER — Encounter: Payer: Self-pay | Admitting: Gastroenterology

## 2015-08-08 ENCOUNTER — Other Ambulatory Visit: Payer: Self-pay | Admitting: Cardiovascular Disease

## 2015-08-08 NOTE — Telephone Encounter (Signed)
Rx refill sent to pharmacy. 

## 2015-08-17 ENCOUNTER — Other Ambulatory Visit: Payer: Self-pay | Admitting: Cardiovascular Disease

## 2015-08-17 DIAGNOSIS — Z79899 Other long term (current) drug therapy: Secondary | ICD-10-CM | POA: Diagnosis not present

## 2015-08-17 DIAGNOSIS — I2510 Atherosclerotic heart disease of native coronary artery without angina pectoris: Secondary | ICD-10-CM | POA: Diagnosis not present

## 2015-08-17 LAB — CBC
HEMATOCRIT: 42.8 % (ref 38.5–50.0)
Hemoglobin: 14.2 g/dL (ref 13.2–17.1)
MCH: 29.9 pg (ref 27.0–33.0)
MCHC: 33.2 g/dL (ref 32.0–36.0)
MCV: 90.1 fL (ref 80.0–100.0)
MPV: 10.8 fL (ref 7.5–12.5)
Platelets: 230 10*3/uL (ref 140–400)
RBC: 4.75 MIL/uL (ref 4.20–5.80)
RDW: 13.1 % (ref 11.0–15.0)
WBC: 6.8 10*3/uL (ref 3.8–10.8)

## 2015-08-17 LAB — COMPREHENSIVE METABOLIC PANEL
ALBUMIN: 4.1 g/dL (ref 3.6–5.1)
ALT: 20 U/L (ref 9–46)
AST: 24 U/L (ref 10–35)
Alkaline Phosphatase: 62 U/L (ref 40–115)
BILIRUBIN TOTAL: 0.9 mg/dL (ref 0.2–1.2)
BUN: 8 mg/dL (ref 7–25)
CALCIUM: 9.8 mg/dL (ref 8.6–10.3)
CHLORIDE: 102 mmol/L (ref 98–110)
CO2: 28 mmol/L (ref 20–31)
Creat: 1.46 mg/dL — ABNORMAL HIGH (ref 0.70–1.18)
GLUCOSE: 101 mg/dL — AB (ref 65–99)
POTASSIUM: 5.2 mmol/L (ref 3.5–5.3)
Sodium: 137 mmol/L (ref 135–146)
Total Protein: 6.9 g/dL (ref 6.1–8.1)

## 2015-08-17 LAB — LIPID PANEL
CHOL/HDL RATIO: 3.4 ratio (ref <=5.0)
Cholesterol: 136 mg/dL (ref 125–200)
HDL: 40 mg/dL (ref >=40)
LDL CALC: 64 mg/dL (ref <130)
TRIGLYCERIDES: 158 mg/dL — AB (ref <150)
VLDL: 32 mg/dL — ABNORMAL HIGH (ref <30)

## 2015-08-17 LAB — TSH: TSH: 1.9 m[IU]/L (ref 0.40–4.50)

## 2015-08-19 ENCOUNTER — Ambulatory Visit (INDEPENDENT_AMBULATORY_CARE_PROVIDER_SITE_OTHER): Payer: Medicare Other | Admitting: Cardiovascular Disease

## 2015-08-19 ENCOUNTER — Encounter: Payer: Self-pay | Admitting: Cardiovascular Disease

## 2015-08-19 VITALS — BP 110/76 | HR 57 | Ht 71.0 in | Wt 215.4 lb

## 2015-08-19 DIAGNOSIS — I252 Old myocardial infarction: Secondary | ICD-10-CM

## 2015-08-19 DIAGNOSIS — N183 Chronic kidney disease, stage 3 unspecified: Secondary | ICD-10-CM

## 2015-08-19 DIAGNOSIS — E785 Hyperlipidemia, unspecified: Secondary | ICD-10-CM

## 2015-08-19 DIAGNOSIS — I10 Essential (primary) hypertension: Secondary | ICD-10-CM

## 2015-08-19 DIAGNOSIS — I25118 Atherosclerotic heart disease of native coronary artery with other forms of angina pectoris: Secondary | ICD-10-CM | POA: Diagnosis not present

## 2015-08-19 DIAGNOSIS — Z9989 Dependence on other enabling machines and devices: Secondary | ICD-10-CM

## 2015-08-19 DIAGNOSIS — N189 Chronic kidney disease, unspecified: Secondary | ICD-10-CM | POA: Diagnosis not present

## 2015-08-19 DIAGNOSIS — G4733 Obstructive sleep apnea (adult) (pediatric): Secondary | ICD-10-CM

## 2015-08-19 MED ORDER — TICAGRELOR 90 MG PO TABS: 90.0000 mg | ORAL_TABLET | Freq: Two times a day (BID) | ORAL | Status: DC

## 2015-08-19 NOTE — Patient Instructions (Signed)
Your physician wants you to follow-up in: 6 Months You will receive a reminder letter in the mail two months in advance. If you don't receive a letter, please call our office to schedule the follow-up appointment.  

## 2015-08-21 ENCOUNTER — Encounter: Payer: Self-pay | Admitting: Cardiovascular Disease

## 2015-08-21 NOTE — Progress Notes (Signed)
Patient ID: Joshua Hahn, male   DOB: 1945-10-20, 70 y.o.   MRN: [id number]    HPI: Joshua Hahn is a 70 y.o. male who presents for a 9 month follow-up cardiology evaluation.   Joshua Hahn has established CAD dating back to 1988 at which time I performed PTCA of his distal RCA. 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  In 2010 he developed unstable angina and was found to have significant native CAD with total occlusion of the LAD at its ostium, diffuse AV groove circumflex stenoses, 99% stenosis in a diffusely diseased a 1 vessel with 95% on 2 stenosis. The distal limb of the graft was opacified between OM1-2 but had subtotal occlusion. He had an occluded proximal limb of the graft from the aorta and consequently the entire circumflex territory was in jeopardy. He had native RCA occlusion as well as an occluded graft to the RCA. The RCA was collateralized from the circumflex vessel. He had a patent LIMA graft to the diagonal vessel and patent vein graft to the LAD. On  08/23/2008 he underwent complex intervention to his native circumflex system utilizing high-speed rotational atherectomy of his diffusely diseased native circumflex vessel with ultimate insertion of a 2.5x28 mm Xience DES stent proximally and 2.25 Taxus stent more distally in tandem.  In January 2013 a nuclear perfusion study remained low risk but did show previously noted fixed basal to mid inferior scar. He has a history of prostate CA, status post laparoscopic radical prostatectomy.  Joshua Hahn developed recurrent episodes of chest pain symptoms which led to hospitalization on 02/04/2013. Catheterization revealed a 95% focal in-stent  restenosis in the distal third of the stent in the circumflex vessel and he also had an 85-90% near ostial stenosis in the vein graft supplying the mid LAD. He was hydrated following his catheterization and on up to over 2 2014 underwent two-vessel intervention to the native circumflex vessel for his in-stent restenosis enteroscope/PTCA in the 95% stenosis reduced to 0%, and PTCA/stenting of the ostium in the vein graft supplying the LAD ultimate insertion of a 3.5x15 mm size expedition DES stent was dilated 3.6 mm. The stenosis was reduced to 0%. Subsequently, he has felt significantly improved. He has more energy. He denies shortness of breath with activity. He denies recurrent chest pain  Additional problems include hypertension, mixed hyperlipidemia, obstructive sleep apnea CPAP therapy, prostate cancer, status post radical prostatectomy laparoscopically performed by Dr. Rosana Hahn and left parotid gland surgery. I last saw him I stressed the importance that he continue to use his CPAP therapy with 100% compliant. I also reinitiated ARB therapy with losartan, although the past he had been on Diovan but due to cost issues his insurance company would no longer cover this.   An NMR profile in 2014 showed significant improvement with a previous LDL particle #2733  being reduced to 1491. Calculated LDL cholesterol was reduced from 170 to 91. Triglycerides although improved from 289 to 215 were still elevated. Small particles were markedly reduced but still elevated at 859, where previously these had been 1823. Insulin resistance were still elevated at 64.  Since I last saw him 6 months ago, he feels that he has been doing exceptionally well.  He denies any chest pain or shortness of breath.  He feels that he has had the most energy that he has had over the last several years.  He now admits to 100% CPAP compliance.  He oftentimes is working up to 12 hours per day and denies any hypersomnolence or daytime fatigue.   He is unaware of any palpitations.  He continues to exercise.  Occasionally, he notes some trivial ankle edema.  He denies excessive bleeding.    Since I last saw him 9 months ago, he has continued to feel well.  He is unaware of any recurrent anginal symptoms.  He is using CPAP.  He has more energy.  He sees Dr. Minus Hahn for urologic follow-up evaluation.  He had recent laboratory several days ago.  He has mild renal insufficiency, but his creatinine had improved.  He has continued to take Crestor 40 mg daily for hyperlipidemia.  He is on dual antiplatelet therapy with aspirin and Brilinta.  He is not having anginal symptoms on isosorbide, metoprolol, in addition to Ranexa 500 mg twice a day.  He presents for evaluation.  Past Medical History  Diagnosis Date  . 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 levels LDL IIIa+b% and HDL2b%  . Coronary artery disease 1994    PCI'80s, CABG '94, CFX DES 4/10  . HTN (hypertension) 10/16/2011    Echo -EF 50-55% ,LV NORMAL  . Sleep apnea, obstructive     using C-PAP  . Myocardial infarction [facility]) 1994    inferior wall MI at [facility]  . Diverticulosis   . Prostate cancer ([place])   . Sleep apnea     uses CPAP PRN  . Myocardial infarct ([name])   . Hypertension     Past Surgical History  Procedure Laterality Date  . Coronary artery bypass graft  1994  . Cardiac catheterization  06/23/2008    occlusion sequential OM1 andOM2 graft as well vein graft to RCA.LAD and diagonal system remain intact. RCA  collateralized  . Percutaneous coronary stent intervention (pci-s)  06/25/2008    rotational atherectomy w/diffuse disease native circ. w/drug eluting stents , PTCA on one vessel; grat occlusion to RCA w/native RCA proxim. occlusion. Circ.supplied collaaterals distal RCA  following intervention  . Left parotid gland surgry  50/2774    Dr. Erik Obey  . Prostatectomy  06/2009     robotic-asssisted laparoscopic radical by Dr Joshua Hahn  . Exploratory laparotomy      ideopathic retroperitoneal fibrosis  . Left heart catheterization with coronary/graft angiogram N/A 02/04/2013    Procedure: LEFT HEART CATHETERIZATION WITH Beatrix Fetters;  Surgeon: [name], MD;  Location: [facility] CATH LAB;  Service: Cardiovascular;  Laterality: N/A;  . Percutaneous coronary stent intervention (pci-s) N/A 02/05/2013    Procedure: PERCUTANEOUS CORONARY STENT INTERVENTION (PCI-S);  Surgeon: [name], MD;  Location: [facility] CATH LAB;  Service: Cardiovascular;  Laterality: N/A;  . Coronary angioplasty with stent placement    . Abdominal surgery      No Known Allergies  Current Outpatient Prescriptions  Medication Sig Dispense Refill  . aspirin 81 MG tablet Take 81 mg by mouth daily.    . cholecalciferol (VITAMIN D) 1000 UNITS tablet Take 1,000 Units by mouth daily.    . Coenzyme Q10 (CO Q 10) 100 MG CAPS Take 300 mg by mouth every evening.     . isosorbide mononitrate (IMDUR) 60 MG 24 hr tablet Take 1  tablet (60 mg total) by mouth daily. Must keep appointment on 4/14 30 tablet 1  . losartan (COZAAR) 50 MG tablet TAKE 1 TABLET (50 MG TOTAL) BY MOUTH DAILY. 90 tablet 0  . metoprolol tartrate (LOPRESSOR) 25 MG tablet TAKE 1 TABLET BY MOUTH 2 TIMES DAILY. PLEASE MAKE APPOINTMENT FOR REFILLS 180 tablet 2  . nitroGLYCERIN (NITROSTAT) 0.4 MG SL tablet Place 0.4 mg under the tongue every 5 (five) minutes as needed for chest pain.    Marland Kitchen omega-3 acid ethyl esters (LOVAZA) 1 G capsule TAKE 2 CAPSULES (2 G TOTAL) BY MOUTH 2 (TWO) TIMES DAILY. 120 capsule 8  . RANEXA 500 MG 12 hr tablet TAKE 1 TABLET (500 MG TOTAL) BY MOUTH 2 (TWO) TIMES DAILY. 90 tablet 0  . rosuvastatin (CRESTOR) 40 MG tablet Take 1 tablet (40 mg total) by mouth daily. PLEASE KEEP UPCOMING APPOINTMENT 30 tablet 2  . ticagrelor (BRILINTA) 90 MG TABS tablet Take 1 tablet (90 mg total) by mouth 2 (two) times daily. 180 tablet 3    . vitamin B-12 (CYANOCOBALAMIN) 100 MCG tablet Take 100 mcg by mouth daily.      No current facility-administered medications for this visit.    Social History   Social History  . Marital Status: Married    Spouse Name: N/A  . Number of Children: 2  . Years of Education: N/A   Occupational History  . pastor    Social History Main Topics  . Smoking status: Former Smoker    Types: Cigarettes    Quit date: 05/07/1973  . Smokeless tobacco: Not on file  . Alcohol Use: No  . Drug Use: No  . Sexual Activity: Not on file   Other Topics Concern  . Not on file   Social History Narrative   ** Merged History Encounter **       Married 48 years in 2016. 2 children. 5 grand kids.   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   Hobbies: speaking events, traveling   Socially he is married. He has 2 children 5 grandchildren. 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 There is no tobacco use. He does travel. He will be returning to [place] in September for his mission trip and church work.  Family History  Problem Relation Age of Onset  . Coronary artery disease Brother 55    2 bros with early CAD  . Pancreatic cancer Father   . Heart disease Father   . Hypertension Father   . Stroke Mother   . Hypertension Mother   . Heart disease Brother     x 3  . Throat cancer Brother   . Colon cancer Neg Hx   . Esophageal cancer Neg Hx   . Stomach cancer Neg Hx   . Rectal cancer Neg Hx     ROS General: Negative; No fevers, chills, or night sweats;  HEENT: Negative; No changes in vision or hearing, sinus congestion, difficulty swallowing Pulmonary: Negative; No cough, wheezing, shortness of breath, hemoptysis Cardiovascular: See HPI GI: Negative; No nausea, vomiting, diarrhea, or abdominal pain GU: Negative; No dysuria, hematuria, or difficulty  voiding Musculoskeletal: Negative; no myalgias, joint pain, or weakness Hematologic/Oncology: Negative; no easy bruising, bleeding Endocrine: Negative; no heat/cold intolerance; no diabetes Neuro: Negative; no changes in balance, headaches Skin: Negative; No rashes or skin lesions Psychiatric: Negative; No behavioral  problems, depression Sleep: Positive for OSA on CPAP.  No snoring, daytime sleepiness, hypersomnolence, bruxism, restless legs, hypnogognic hallucinations, no cataplexy Other comprehensive 14 point system review is negative.  PE BP 110/76 mmHg  Pulse 57  Ht 5' 11"  (1.803 m)  Wt 215 lb 6.4 oz (97.705 kg)  BMI 30.06 kg/m2   Wt Readings from Last 3 Encounters:  08/19/15 215 lb 6.4 oz (97.705 kg)  07/25/15 205 lb (92.987 kg)  12/03/14 214 lb (97.07 kg)  General: Alert, oriented, no distress.  Skin: normal turgor, no rashes HEENT: Normocephalic, atraumatic. Pupils round and reactive; sclera anicteric;no lid lag.  Nose without nasal septal hypertrophy Mouth/Parynx benign; Mallinpatti scale 3 Neck: No JVD, no carotid bruits with normal carotid upstroke. Lungs: clear to ausculatation and percussion; no wheezing or rales Chest wall: Nontender to palpation Heart: RRR, s1 s2 normal  1-6/1 systolic murmur; no S3 gallop.  No diastolic murmur.  No rubs thrills or heaves. Abdomen: soft, nontender; no hepatosplenomehaly, BS+; abdominal aorta nontender and not dilated by palpation. Pulses 2+ groin catheterization site stable. No ecchymoses or hematoma. Extremities: no clubbing cyanosis or edema, Homan's sign negative  Back: No CVA tenderness Neurologic: grossly nonfocal Psychological: Normal affect and mood; normal cognition  ECG (independently read by me): Sinus bradycardia 57 bpm.  First-degree AV block with a PR interval 264 ms.  Previously noted T-wave changes inferolaterally.  July 2016 ECG (independently read by me): Normal sinus rhythm at 64 bpm.  Nondiagnostic T-wave  changes inferolaterally.  First-degree AV block with a PR interval at 238 ms.  January 2016 ECG (independently read by me): Normal sinus rhythm at 60 bpm.  First-degree AV block with a PR interval at 236 ms.  Nonspecific ST changes  December 2014 ECG: Sinus rhythm at 61 beats per minute. Previously old Q wave in 3. Nondiagnostic ST changes   LABS: BMP Latest Ref Rng 08/17/2015 07/25/2015 11/22/2014  Glucose 65 - 99 mg/dL 101(H) 118(H) 100(H)  BUN 7 - 25 mg/dL 8 13 11   Creatinine 0.70 - 1.18 mg/dL 1.46(H) 1.97(H) 1.62(H)  Sodium 135 - 146 mmol/L 137 134(L) 140  Potassium 3.5 - 5.3 mmol/L 5.2 4.2 4.6  Chloride 98 - 110 mmol/L 102 105 105  CO2 20 - 31 mmol/L 28 23 27   Calcium 8.6 - 10.3 mg/dL 9.8 8.7(L) 9.9   Hepatic Function Latest Ref Rng 08/17/2015 11/22/2014 04/13/2013  Total Protein 6.1 - 8.1 g/dL 6.9 7.1 7.5  Albumin 3.6 - 5.1 g/dL 4.1 4.0 4.4  AST 10 - 35 U/L 24 21 24   ALT 9 - 46 U/L 20 16 20   Alk Phosphatase 40 - 115 U/L 62 61 60  Total Bilirubin 0.2 - 1.2 mg/dL 0.9 0.7 0.9   CBC Latest Ref Rng 08/17/2015 07/25/2015 11/22/2014  WBC 3.8 - 10.8 K/uL 6.8 7.9 6.7  Hemoglobin 13.2 - 17.1 g/dL 14.2 13.8 14.1  Hematocrit 38.5 - 50.0 % 42.8 41.8 42.7  Platelets 140 - 400 K/uL 230 172 208   Lab Results  Component Value Date   MCV 90.1 08/17/2015   MCV 90.7 07/25/2015   MCV 89.3 11/22/2014   Lab Results  Component Value Date   TSH 1.90 08/17/2015   Lab Results  Component Value Date   HGBA1C * 10/23/2009    6.4 (NOTE)  According to the ADA Clinical Practice Recommendations for 2011, when HbA1c is used as a screening test:   >=6.5%   Diagnostic of Diabetes Mellitus           (if abnormal result  is confirmed)  5.7-6.4%   Increased risk of developing Diabetes Mellitus  References:Diagnosis and Classification of Diabetes Mellitus,Diabetes OMAY,0459,97(FSFSE 1):S62-S69 and Standards of Medical Care in         Diabetes -  2011,Diabetes LTRV,2023,34  (Suppl 1):S11-S61.   Lipid Panel     Component Value Date/Time   CHOL 136 08/17/2015 1158   CHOL 137 11/22/2014 1117   TRIG 158* 08/17/2015 1158   TRIG 160* 11/22/2014 1117   TRIG 216* 03/04/2006 0946   HDL 40 08/17/2015 1158   HDL 43 11/22/2014 1117   CHOLHDL 3.4 08/17/2015 1158   CHOLHDL 6.3 CALC 03/04/2006 0946   VLDL 32* 08/17/2015 1158   LDLCALC 64 08/17/2015 1158   LDLCALC 62 11/22/2014 1117   LDLDIRECT 135.5 02/27/2012 1718   LDLDIRECT 143.1 03/04/2006 0946     RADIOLOGY: No results found.    ASSESSMENT AND PLAN: Rev. Mikesell is a 70 year old African-American male who is 29 years following his initial PTCA of his distal RCA and 23 years following his inferior wall myocardial infarction and subsequent CABG revascularization surgery. He is status post complex rotational atherectomy and diffuse stenting of his native circumflex vessel which also supplied his RCA territory. He  developed unstable anginal symptoms in October 2014 and had mild positive troponin ruling in for non-ST segment MI. Catheterization in 02/04/2013 showed focal 95% in-stent restenosis in the distal third of the previously placed stent in the circumflex vessel. This stenosis occurred after the collateral  of the circumflex supplying the distal right coronary artery. He also had high grade 85-90% near ostial stenosis in the vein graft supplying the LAD. Fortunately he underwent successful two-vessel coronary intervention with cutting balloon angiosculpt to the circumflex in-stent restenosis, and PTCA/insertion of a new DES stent in the ostium of the vein graft supplying the LAD.  Since his last interventions, he has continued to remarkably well.  He denies recurrent anginal symptoms on his regimen now consisting of Ranexa 500 mg twice a day, Toprol, alt 25 mg twice a day, losartan 50 g daily, and isosorbide mononitrate 60 mg.  His blood pressure today is well controlled and on repeat by  me was 120/76.  He continues to be on dual antiplatelet therapy with aspirin and Brilinta and is doing well without bleeding.  I reviewed his recent laboratory.  He has a history of renal insufficiency was most recent creatinine has improved at 1.47 from 1.97 and 1.62.   He is tolerating Crestor 40 mg daily and his recent lipids show cholesterol of 136, triglycerides 158, HDL of 40 and LDL of 64, which is remarkably improved from 2 years previously.  He also takes coenzyme Q10.  He denies myalgias.  He has obstructive sleep apnea but has been using CPAP therapy compliantly.  He now has more energy.  He denies residual daytime sleepiness.  I suspect this is significantly contributed by his 100% CPAP compliance.  He will be going to [place] in September.  At present he is stable on his current medical regimen.  I will see him in 6 months for reevaluation.  Time spent: 25 minutes  [name], MD, [facility]  08/21/2015 8:59 PM

## 2015-09-09 ENCOUNTER — Other Ambulatory Visit: Payer: Self-pay | Admitting: Cardiovascular Disease

## 2015-09-09 NOTE — Telephone Encounter (Signed)
Rx request sent to pharmacy.  

## 2015-09-18 ENCOUNTER — Other Ambulatory Visit: Payer: Self-pay | Admitting: Cardiovascular Disease

## 2015-09-19 NOTE — Telephone Encounter (Signed)
Rx request sent to pharmacy.  

## 2015-10-07 ENCOUNTER — Other Ambulatory Visit: Payer: Self-pay | Admitting: Cardiovascular Disease

## 2015-10-07 NOTE — Telephone Encounter (Signed)
Rx(s) sent to pharmacy electronically.  

## 2015-10-24 DIAGNOSIS — C61 Malignant neoplasm of prostate: Secondary | ICD-10-CM | POA: Diagnosis not present

## 2015-10-25 DIAGNOSIS — N5201 Erectile dysfunction due to arterial insufficiency: Secondary | ICD-10-CM | POA: Diagnosis not present

## 2015-10-25 DIAGNOSIS — N393 Stress incontinence (female) (male): Secondary | ICD-10-CM | POA: Diagnosis not present

## 2015-10-25 DIAGNOSIS — C61 Malignant neoplasm of prostate: Secondary | ICD-10-CM | POA: Diagnosis not present

## 2016-01-02 ENCOUNTER — Other Ambulatory Visit: Payer: Self-pay

## 2016-01-11 ENCOUNTER — Other Ambulatory Visit: Payer: Self-pay | Admitting: Cardiovascular Disease

## 2016-01-12 ENCOUNTER — Other Ambulatory Visit: Payer: Self-pay | Admitting: Internal Medicine

## 2016-01-27 DIAGNOSIS — H40023 Open angle with borderline findings, high risk, bilateral: Secondary | ICD-10-CM | POA: Diagnosis not present

## 2016-02-22 ENCOUNTER — Other Ambulatory Visit: Payer: Self-pay | Admitting: Cardiovascular Disease

## 2016-03-13 ENCOUNTER — Encounter: Payer: Self-pay | Admitting: Cardiovascular Disease

## 2016-03-19 ENCOUNTER — Telehealth: Payer: Self-pay | Admitting: *Deleted

## 2016-03-19 ENCOUNTER — Ambulatory Visit (INDEPENDENT_AMBULATORY_CARE_PROVIDER_SITE_OTHER): Payer: Medicare Other | Admitting: Cardiovascular Disease

## 2016-03-19 ENCOUNTER — Encounter: Payer: Self-pay | Admitting: Cardiovascular Disease

## 2016-03-19 VITALS — BP 121/71 | HR 65 | Ht 70.5 in | Wt 213.2 lb

## 2016-03-19 DIAGNOSIS — I10 Essential (primary) hypertension: Secondary | ICD-10-CM | POA: Diagnosis not present

## 2016-03-19 DIAGNOSIS — G4733 Obstructive sleep apnea (adult) (pediatric): Secondary | ICD-10-CM

## 2016-03-19 DIAGNOSIS — I25118 Atherosclerotic heart disease of native coronary artery with other forms of angina pectoris: Secondary | ICD-10-CM

## 2016-03-19 DIAGNOSIS — I252 Old myocardial infarction: Secondary | ICD-10-CM | POA: Diagnosis not present

## 2016-03-19 DIAGNOSIS — Z9989 Dependence on other enabling machines and devices: Secondary | ICD-10-CM | POA: Diagnosis not present

## 2016-03-19 DIAGNOSIS — N183 Chronic kidney disease, stage 3 unspecified: Secondary | ICD-10-CM

## 2016-03-19 NOTE — Patient Instructions (Signed)
Your physician wants you to follow-up in: 6 months or sooner if needed. You will receive a reminder letter in the mail two months in advance. If you don't receive a letter, please call our office to schedule the follow-up appointment.   If you need a refill on your cardiac medications before your next appointment, please call your pharmacy. 

## 2016-03-19 NOTE — Telephone Encounter (Signed)
Faxed order and supporting documentation over to [facility] for patient to get new CPAP supplies and headgear.

## 2016-03-19 NOTE — Progress Notes (Signed)
Patient ID: Joshua Hahn, male   DOB: October 31, 1945, 70 y.o.   MRN: [id number]    HPI: Joshua Hahn is a 70 y.o. male who presents for a 9 month follow-up cardiology evaluation.   Joshua Hahn has CAD dating back to 1988 at which time I performed PTCA of his distal RCA. 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  In 2010 he developed unstable angina and was found to have significant native CAD with total occlusion of the LAD at its ostium, diffuse AV groove circumflex stenoses, 99% stenosis in a diffusely diseased a 1 vessel with 95% on 2 stenosis. The distal limb of the graft was opacified between OM1-2 but had subtotal occlusion. He had an occluded proximal limb of the graft from the aorta and consequently the entire circumflex territory was in jeopardy. He had native RCA occlusion as well as an occluded graft to the RCA. The RCA was collateralized from the circumflex vessel. He had a patent LIMA graft to the diagonal vessel and patent vein graft to the LAD. On  08/23/2008 he underwent complex intervention to his native circumflex system utilizing high-speed rotational atherectomy of his diffusely diseased native circumflex vessel with ultimate insertion of a 2.5x28 mm Xience DES stent proximally and 2.25 Taxus stent more distally in tandem.  In January 2013 a nuclear perfusion study remained low risk but did show previously noted fixed basal to mid inferior scar. He has a history of prostate CA, status post laparoscopic radical prostatectomy.  Joshua Hahn developed recurrent episodes of chest pain symptoms which led to hospitalization on 02/04/2013. Catheterization revealed a 95% focal in-stent restenosis in the  distal third of the stent in the circumflex vessel and he also had an 85-90% near ostial stenosis in the vein graft supplying the mid LAD. He was hydrated following his catheterization and on up to over 2 2014 underwent two-vessel intervention to the native circumflex vessel for his in-stent restenosis enteroscope/PTCA in the 95% stenosis reduced to 0%, and PTCA/stenting of the ostium in the vein graft supplying the LAD ultimate insertion of a 3.5x15 mm size expedition DES stent was dilated 3.6 mm. The stenosis was reduced to 0%. Subsequently, he has felt significantly improved. He has more energy. He denies shortness of breath with activity. He denies recurrent chest pain  Additional problems include hypertension, mixed hyperlipidemia, obstructive sleep apnea CPAP therapy, prostate cancer, status post radical prostatectomy laparoscopically performed by [name] and left parotid gland surgery. I last saw him I stressed the importance that he continue to use his CPAP therapy with 100% compliant. I also reinitiated ARB therapy with losartan, although the past he had been on Diovan but due to cost issues his insurance company would no longer cover this.   An NMR profile in 2014 showed significant improvement with a previous LDL particle #2733  being reduced to 1491. Calculated LDL cholesterol was reduced from 170 to 91. Triglycerides although improved from 289 to 215 were still elevated. Small particles were markedly reduced but still elevated at 859, where previously these had been 1823. Insulin resistance were still elevated at 64.  He feels that he has been doing exceptionally well.  He denies any chest pain or shortness of  breath.  He exercises regularly and walks 5 miles at least 3-4 days per week.  He feels that he has had the most energy that he has had over the last several years.  He admits to 100% CPAP compliance.  He oftentimes is working up to 12 hours per day and denies any hypersomnolence or  daytime fatigue.  He is unaware of any palpitations.  Occasionally, he notes some trivial ankle edema.  He denies excessive bleeding.    He sees Dr. Gaynelle Arabian for urologic follow-up evaluation.  He last laboratory was in April 2017.  He has mild renal insufficiency, but his creatinine had improved to 1.47.  He has continued to take Crestor 40 mg daily for hyperlipidemia.  He is on dual antiplatelet therapy with aspirin and Brilinta.  He is not having anginal symptoms on isosorbide, metoprolol, in addition to Ranexa 500 mg twice a day.  He presents for evaluation.  Past Medical History:  Diagnosis Date  . Coronary artery disease 1994   PCI'80s, CABG '94, CFX DES 4/10  . Diverticulosis   . HTN (hypertension) 10/16/2011   Echo -EF 50-55% ,LV NORMAL  . 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 levels LDL IIIa+b% and HDL2b%  . Hypertension   . Myocardial infarct   . Myocardial infarction 1994   inferior wall MI [facility]  . Prostate cancer ([place])   . Sleep apnea    uses CPAP PRN  . Sleep apnea, obstructive    using C-PAP    Past Surgical History:  Procedure Laterality Date  . ABDOMINAL SURGERY    . CARDIAC CATHETERIZATION  06/23/2008   occlusion sequential OM1 andOM2 graft as well vein graft to RCA.LAD and diagonal system remain intact. RCA  collateralized  . CORONARY ANGIOPLASTY WITH STENT PLACEMENT    . CORONARY ARTERY BYPASS GRAFT  1994  . EXPLORATORY LAPAROTOMY     ideopathic retroperitoneal fibrosis  . LEFT HEART CATHETERIZATION WITH CORONARY/GRAFT ANGIOGRAM N/A 02/04/2013   Procedure: LEFT HEART CATHETERIZATION WITH Beatrix Fetters;  Surgeon: [name], MD;  Location: [facility] C[facility] LAB;  Service: Cardiovascular;  Laterality: N/A;  . LEFT PAROTID GLAND SURGRY  [id number]   Dr. Erik Obey  . PERCUTANEOUS CORONARY STENT INTERVENTION (PCI-S)  06/25/2008   rotational atherectomy w/diffuse  disease native circ. w/drug eluting stents , PTCA on one vessel; grat occlusion to RCA w/native RCA proxim. occlusion. Circ.supplied collaaterals distal RCA  following intervention  . PERCUTANEOUS CORONARY STENT INTERVENTION (PCI-S) N/A 02/05/2013   Procedure: PERCUTANEOUS CORONARY STENT INTERVENTION (PCI-S);  Surgeon: [name], MD;  Location: [facility] CATH LAB;  Service: Cardiovascular;  Laterality: N/A;  . PROSTATECTOMY  06/2009   robotic-asssisted laparoscopic radical by Dr Rosana Hoes    No Known Allergies  Current Outpatient Prescriptions  Medication Sig Dispense Refill  . aspirin 81 MG tablet Take 81 mg by mouth daily.    . cholecalciferol (VITAMIN D) 1000 UNITS tablet Take 1,000 Units by mouth daily.    . Coenzyme Q10 (CO Q 10) 100 MG CAPS Take 300 mg by mouth every evening.     . isosorbide mononitrate (IMDUR) 60 MG 24 hr tablet Take 1 tablet (60 mg total) by mouth daily. 30 tablet 11  . losartan (COZAAR) 50 MG tablet TAKE 1 TABLET (50 MG TOTAL) BY MOUTH DAILY. 90 tablet 0  . metoprolol tartrate (LOPRESSOR) 25 MG tablet Take 1 tablet (25 mg total) by mouth 2 (two)  times daily. 180 tablet 2  . nitroGLYCERIN (NITROSTAT) 0.4 MG SL tablet Place 0.4 mg under the tongue every 5 (five) minutes as needed for chest pain.    Marland Kitchen omega-3 acid ethyl esters (LOVAZA) 1 g capsule TAKE 2 CAPSULES (2 G TOTAL) BY MOUTH 2 (TWO) TIMES DAILY. 120 capsule 6  . RANEXA 500 MG 12 hr tablet TAKE 1 TABLET (500 MG TOTAL) BY MOUTH 2 (TWO) TIMES DAILY. 90 tablet 0  . rosuvastatin (CRESTOR) 40 MG tablet Take 1 tablet (40 mg total) by mouth daily. 30 tablet 5  . ticagrelor (BRILINTA) 90 MG TABS tablet Take 1 tablet (90 mg total) by mouth 2 (two) times daily. 180 tablet 3  . vitamin B-12 (CYANOCOBALAMIN) 100 MCG tablet Take 100 mcg by mouth daily.      No current facility-administered medications for this visit.     Social History   Social History  . Marital status: Married    Spouse name: N/A  . Number of children: 2   . Years of education: N/A   Occupational History  . pastor    Social History Main Topics  . Smoking status: Former Smoker    Types: Cigarettes    Quit date: 05/07/1973  . Smokeless tobacco: Never Used  . Alcohol use No  . Drug use: No  . Sexual activity: Not on file   Other Topics Concern  . Not on file   Social History Narrative   ** Merged History Encounter **       Married 48 years in 2016. 2 children. 5 grand kids.   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   Hobbies: speaking events, traveling   Socially he is married. He has 2 children 5 grandchildren. 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 There is no tobacco use. He does travel.  Continues to be active and recently ran several conferences for [employer].  He still preaches.  Family History  Problem Relation Age of Onset  . Pancreatic cancer Father   . Heart disease Father   . Hypertension Father   . Stroke Mother   . Hypertension Mother   . Coronary artery disease Brother 33    2 bros with early CAD  . Heart disease Brother     x 3  . Throat cancer Brother   . Colon cancer Neg Hx   . Esophageal cancer Neg Hx   . Stomach cancer Neg Hx   . Rectal cancer Neg Hx     ROS General: Negative; No fevers, chills, or night sweats;  HEENT: Negative; No changes in vision or hearing, sinus congestion, difficulty swallowing Pulmonary: Negative; No cough, wheezing, shortness of breath, hemoptysis Cardiovascular: See HPI GI: Negative; No nausea, vomiting, diarrhea, or abdominal pain GU: Negative; No dysuria, hematuria, or difficulty voiding Musculoskeletal: Negative; no myalgias, joint pain, or weakness Hematologic/Oncology: Negative; no easy bruising, bleeding Endocrine: Negative; no heat/cold intolerance; no diabetes Neuro: Negative; no changes in balance, headaches Skin: Negative; No rashes  or skin lesions Psychiatric: Negative; No behavioral problems, depression Sleep: Positive for OSA on CPAP.  No snoring, daytime sleepiness, hypersomnolence, bruxism, restless legs, hypnogognic hallucinations, no cataplexy Other comprehensive 14 point system review is negative.  PE BP 121/71   Pulse 65   Ht 5' 10.5" (1.791 m)   Wt 213 lb 3.2 oz (96.7 kg)   BMI 30.16  kg/m   Wt Readings from Last 3 Encounters:  08/19/15 215 lb 6.4 oz (97.7 kg)  07/25/15 205 lb (93 kg)  12/03/14 214 lb (97.1 kg)  General: Alert, oriented, no distress.  Skin: normal turgor, no rashes HEENT: Normocephalic, atraumatic. Pupils round and reactive; sclera anicteric;no lid lag.  Nose without nasal septal hypertrophy Mouth/Parynx benign; Mallinpatti scale 3 Neck: No JVD, no carotid bruits with normal carotid upstroke. Lungs: clear to ausculatation and percussion; no wheezing or rales Chest wall: Nontender to palpation Heart: RRR, s1 s2 normal  1-1/9 systolic murmur; no S3 gallop.  No diastolic murmur.  No rubs thrills or heaves. Abdomen: soft, nontender; no hepatosplenomehaly, BS+; abdominal aorta nontender and not dilated by palpation. Pulses 2+ groin catheterization site stable. No ecchymoses or hematoma. Extremities: no clubbing cyanosis or edema, Homan's sign negative  Back: No CVA tenderness Neurologic: grossly nonfocal Psychological: Normal affect and mood; normal cognition  ECG (independently read by me): Normal sinus rhythm at 65 bpm.  First degree AV block with PR interval 240 ms.  Small inferior Q wave compatible with his prior inferior MI.  ECG (independently read by me): Sinus bradycardia 57 bpm.  First-degree AV block with a PR interval 264 ms.  Previously noted T-wave changes inferolaterally.  July 2016 ECG (independently read by me): Normal sinus rhythm at 64 bpm.  Nondiagnostic T-wave changes inferolaterally.  First-degree AV block with a PR interval at 238 ms.  January 2016 ECG  (independently read by me): Normal sinus rhythm at 60 bpm.  First-degree AV block with a PR interval at 236 ms.  Nonspecific ST changes  December 2014 ECG: Sinus rhythm at 61 beats per minute. Previously old Q wave in 3. Nondiagnostic ST changes   LABS: BMP Latest Ref Rng & Units 08/17/2015 07/25/2015 11/22/2014  Glucose 65 - 99 mg/dL 101(H) 118(H) 100(H)  BUN 7 - 25 mg/dL _0 Creatinine 0.70 - 1.18 mg/dL 1.46(H) 1.97(H) 1.62(H)  Sodium 135 - 146 mmol/L 137 134(L) 140  Potassium 3.5 - 5.3 mmol/L 5.2 4.2 4.6  Chloride 98 - 110 mmol/L 102 105 105  CO2 20 - 31 mmol/L _1 Calcium 8.6 - 10.3 mg/dL 9.8 8.7(L) 9.9   Hepatic Function Latest Ref Rng & Units 08/17/2015 11/22/2014 04/13/2013  Total Protein 6.1 - 8.1 g/dL 6.9 7.1 7.5  Albumin 3.6 - 5.1 g/dL 4.1 4.0 4.4  AST 10 - 35 U/L _2 ALT 9 - 46 U/L _3 Alk Phosphatase 40 - 115 U/L 62 61 60  Total Bilirubin 0.2 - 1.2 mg/dL 0.9 0.7 0.9  Bilirubin, Direct 0.0 - 0.3 mg/dL - - -   CBC Latest Ref Rng & Units 08/17/2015 07/25/2015 11/22/2014  WBC 3.8 - 10.8 K/uL 6.8 7.9 6.7  Hemoglobin 13.2 - 17.1 g/dL 14.2 13.8 14.1  Hematocrit 38.5 - 50.0 % 42.8 41.8 42.7  Platelets 140 - 400 K/uL 230 172 208   Lab Results  Component Value Date   MCV 90.1 08/17/2015   MCV 90.7 07/25/2015   MCV 89.3 11/22/2014   Lab Results  Component Value Date   TSH 1.90 08/17/2015   Lab Results  Component Value Date   HGBA1C (H) 10/23/2009    6.4 (NOTE)  According to the ADA Clinical Practice Recommendations for 2011, when HbA1c is used as a screening test:   >=6.5%   Diagnostic of Diabetes Mellitus           (if abnormal result  is confirmed)  5.7-6.4%   Increased risk of developing Diabetes Mellitus  References:Diagnosis and Classification of Diabetes Mellitus,Diabetes SWNI,6270,35(KKXFG 1):S62-S69 and Standards of Medical Care in         Diabetes - 2011,Diabetes HWEX,9371,69    (Suppl 1):S11-S61.   Lipid Panel     Component Value Date/Time   CHOL 136 08/17/2015 1158   CHOL 137 11/22/2014 1117   TRIG 158 (H) 08/17/2015 1158   TRIG 160 (H) 11/22/2014 1117   TRIG 216 (HH) 03/04/2006 0946   HDL 40 08/17/2015 1158   HDL 43 11/22/2014 1117   CHOLHDL 3.4 08/17/2015 1158   VLDL 32 (H) 08/17/2015 1158   LDLCALC 64 08/17/2015 1158   LDLCALC 62 11/22/2014 1117   LDLDIRECT 135.5 02/27/2012 1718     RADIOLOGY: No results found.    ASSESSMENT AND PLAN: Joshua Hahn is a 70 year-old African-American male who is 29 years following his initial PTCA of his distal RCA and 23 years following his inferior wall myocardial infarction and subsequent CABG revascularization surgery. He is status post complex rotational atherectomy and diffuse stenting of his native circumflex vessel which also supplied his RCA territory. He  developed unstable anginal symptoms in October 2014 and had mild positive troponin ruling in for non-ST segment MI. Catheterization in 02/04/2013 showed focal 95% in-stent restenosis in the distal third of the previously placed stent in the circumflex vessel. This stenosis occurred after the collateral  of the circumflex supplying the distal right coronary artery. He also had high grade 85-90% near ostial stenosis in the vein graft supplying the LAD. Fortunately he underwent successful two-vessel coronary intervention with cutting balloon angiosculpt to the circumflex in-stent restenosis, and PTCA/insertion of a new DES stent in the ostium of the vein graft supplying the LAD.  Since his last interventions he has not had any recurrent anginal symptomatomology on his antianginal regimen consisting of Ranexa 500 mg twice a day, Toprol 25 mg twice a day, losartan 50 g daily, and isosorbide mononitrate 60 mg.  His blood pressure today is well controlled and on repeat by me was 122/72.  He continues to be on dual antiplatelet therapy with aspirin and Brilinta and is doing  well without bleeding.  I again reviewed his laboratory from April 2017.  He has a history of renal insufficiency was most recent creatinine has improved at 1.47 from 1.97 and 1.62.   He is tolerating Crestor 40 mg daily and his  lipids were stable with a cholesterol of 136, triglycerides 158, HDL of 40 and LDL of 64, which is remarkably improved from 2 years previously.  He also takes coenzyme Q10.  He denies myalgias.  He has obstructive sleep apnea.  He admits to 100% compliance with CPAP use.  He is in need of new supplies and will need to be set up with a new DME company depending upon his insurance.  He has more energy.  He denies residual daytime sleepiness.  He is a vegetarian.  He has been exercising and walking 5 miles a least 3-4 days per week.  His weight remains stable.  In 6 months.  He will undergo repeat fasting laboratory and I will see him back in the office for reevaluation.  Time spent: 25 minutes  [name], MD, [facility]  03/19/2016 8:20 AM

## 2016-03-23 ENCOUNTER — Other Ambulatory Visit: Payer: Self-pay | Admitting: Internal Medicine

## 2016-03-23 NOTE — Telephone Encounter (Signed)
Rx(s) sent to pharmacy electronically.  

## 2016-03-28 ENCOUNTER — Other Ambulatory Visit: Payer: Self-pay | Admitting: Internal Medicine

## 2016-04-14 ENCOUNTER — Other Ambulatory Visit: Payer: Self-pay | Admitting: Cardiovascular Disease

## 2016-04-16 NOTE — Telephone Encounter (Signed)
Rx(s) sent to pharmacy electronically.  

## 2016-05-30 ENCOUNTER — Other Ambulatory Visit: Payer: Self-pay | Admitting: Cardiovascular Disease

## 2016-06-06 ENCOUNTER — Emergency Department (HOSPITAL_BASED_OUTPATIENT_CLINIC_OR_DEPARTMENT_OTHER)
Admission: EM | Admit: 2016-06-06 | Discharge: 2016-06-06 | Disposition: A | Discharge status: Home / Self Care | Payer: Medicare Other | Attending: Emergency Medicine | Admitting: Emergency Medicine

## 2016-06-06 ENCOUNTER — Encounter (HOSPITAL_BASED_OUTPATIENT_CLINIC_OR_DEPARTMENT_OTHER): Payer: Self-pay | Admitting: *Deleted

## 2016-06-06 DIAGNOSIS — I2510 Atherosclerotic heart disease of native coronary artery without angina pectoris: Secondary | ICD-10-CM | POA: Diagnosis not present

## 2016-06-06 DIAGNOSIS — M542 Cervicalgia: Secondary | ICD-10-CM | POA: Diagnosis not present

## 2016-06-06 DIAGNOSIS — I252 Old myocardial infarction: Secondary | ICD-10-CM | POA: Insufficient documentation

## 2016-06-06 DIAGNOSIS — Z8546 Personal history of malignant neoplasm of prostate: Secondary | ICD-10-CM | POA: Diagnosis not present

## 2016-06-06 DIAGNOSIS — Z7982 Long term (current) use of aspirin: Secondary | ICD-10-CM | POA: Insufficient documentation

## 2016-06-06 DIAGNOSIS — Z87891 Personal history of nicotine dependence: Secondary | ICD-10-CM | POA: Diagnosis not present

## 2016-06-06 DIAGNOSIS — I129 Hypertensive chronic kidney disease with stage 1 through stage 4 chronic kidney disease, or unspecified chronic kidney disease: Secondary | ICD-10-CM | POA: Insufficient documentation

## 2016-06-06 DIAGNOSIS — N183 Chronic kidney disease, stage 3 (moderate): Secondary | ICD-10-CM | POA: Insufficient documentation

## 2016-06-06 DIAGNOSIS — Z79899 Other long term (current) drug therapy: Secondary | ICD-10-CM | POA: Diagnosis not present

## 2016-06-06 MED ORDER — METHOCARBAMOL 500 MG PO TABS: 1000.0000 mg | ORAL_TABLET | Freq: Three times a day (TID) | ORAL | 0 refills | Status: DC | PRN

## 2016-06-06 MED ORDER — ACETAMINOPHEN 500 MG PO TABS: 1000.0000 mg | ORAL_TABLET | Freq: Once | ORAL | Status: DC

## 2016-06-06 MED ORDER — IBUPROFEN 200 MG PO TABS: 200.0000 mg | ORAL_TABLET | Freq: Once | ORAL | Status: DC

## 2016-06-06 MED FILL — METHOCARBAMOL 500 MG TABLET: 500 | 3 days supply | Qty: 20 | Fill #0

## 2016-06-06 NOTE — Discharge Instructions (Signed)
It was our pleasure to provide your ER care today - we hope that you feel better.  Take motrin or aleve as need for pain.   You may also try taking robaxin as need for muscle pain/spasm - no driving when taking.  You may also try gentle massage and heat therapy for symptom relief.  Follow up with primary care doctor in the next couple weeks if symptoms fail to improve/resolve - discuss possible advanced imaging then.   Return to ER if worse, new symptoms, arm numbness/weakness, fevers, neck swelling, other concern.

## 2016-06-06 NOTE — ED Triage Notes (Signed)
Neck pain x 3 weeks unrelieved with heat packs and cold packs. Denies injury

## 2016-06-06 NOTE — ED Provider Notes (Signed)
[facility] DEPT [facility] Provider Note   CSN: [id number] Arrival date & time: 06/06/16  1002     History   Chief Complaint Chief Complaint  Patient presents with  . Neck Pain    HPI Joshua Hahn is a 71 y.o. male.  Patient c/o left sided, posterior/lat neck pain in the past 3 weeks. Patient indicates feels it may start to left of spine, and move up to scalp and out towards left shoulder. No radicular pain down arm. No arm numbness/weakness. No swelling or skin changes to area. No ear pain. No anterior neck or face pain. Denies hx ddd. Denies specific trauma or injury. Is currently taking no meds for pain.  No acute or abrupt worsening today, but persistence of symptoms.    The history is provided by the patient.  Neck Pain   Pertinent negatives include no chest pain, no numbness, no headaches and no weakness.    Past Medical History:  Diagnosis Date  . Coronary artery disease 1994   PCI'80s, CABG '94, CFX DES 4/10  . Diverticulosis   . HTN (hypertension) 10/16/2011   Echo -EF 50-55% ,LV NORMAL  . 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 levels LDL IIIa+b% and HDL2b%  . Hypertension   . Myocardial infarct   . Myocardial infarction 1994   inferior wall [facility] at [facility]  . Prostate cancer ([place])   . Sleep apnea    uses CPAP PRN  . Sleep apnea, obstructive    using C-PAP    Patient Active Problem List   Diagnosis Date Noted  . History of adenomatous polyp of colon 12/10/2014  . 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 Chronic renal insufficiency, stage III (moderate) 02/05/2013  . CAD - PCI 1980s, CABG X 4 1994, DES CFX 08/2008, stent 2014 nstemi 01/11/2013  . Old MI (myocardial infarction) 01/11/2013  . OSA on CPAP 01/11/2013  . ADENOCARCINOMA, PROSTATE, GLEASON GRADE 7 04/05/2009  . Hyperlipidemia 12/18/2006  . Essential hypertension 12/18/2006    Past Surgical History:  Procedure  Laterality Date  . ABDOMINAL SURGERY    . CARDIAC CATHETERIZATION  06/23/2008   occlusion sequential OM1 andOM2 graft as well vein graft to RCA.LAD and diagonal system remain intact. RCA  collateralized  . CORONARY ANGIOPLASTY WITH STENT PLACEMENT    . CORONARY ARTERY BYPASS GRAFT  1994  . EXPLORATORY LAPAROTOMY     ideopathic retroperitoneal fibrosis  . LEFT HEART CATHETERIZATION WITH CORONARY/GRAFT ANGIOGRAM N/A 02/04/2013   Procedure: LEFT HEART CATHETERIZATION WITH Beatrix Fetters;  Surgeon: [name], MD;  Location: [facility] CATH LAB;  Service: Cardiovascular;  Laterality: N/A;  . LEFT PAROTID GLAND SURGRY  [id number]   Dr. Erik Obey  . PERCUTANEOUS CORONARY STENT INTERVENTION (PCI-S)  06/25/2008   rotational atherectomy w/diffuse disease native circ. w/drug eluting stents , PTCA on one vessel; grat occlusion to RCA w/native RCA proxim. occlusion. Circ.supplied collaaterals distal RCA  following intervention  . PERCUTANEOUS CORONARY STENT INTERVENTION (PCI-S) N/A 02/05/2013   Procedure: PERCUTANEOUS CORONARY STENT INTERVENTION (PCI-S);  Surgeon: [name], MD;  Location: [facility] CATH LAB;  Service: Cardiovascular;  Laterality: N/A;  . PROSTATECTOMY  06/2009   robotic-asssisted laparoscopic radical by Dr Rosana Hoes       Home Medications    Prior to Admission medications   Medication Sig Start Date End Date Taking? Authorizing Provider  aspirin 81 MG tablet Take 81 mg by mouth daily.    Historical Provider,  MD  cholecalciferol (VITAMIN D) 1000 UNITS tablet Take 1,000 Units by mouth daily.    Historical Provider, MD  Coenzyme Q10 (CO Q 10) 100 MG CAPS Take 300 mg by mouth every evening.     Historical Provider, MD  isosorbide mononitrate (IMDUR) 60 MG 24 hr tablet Take 1 tablet (60 mg total) by mouth daily. 01/12/16   Pixie Casino, MD  losartan (COZAAR) 50 MG tablet TAKE 1 TABLET (50 MG TOTAL) BY MOUTH DAILY. 04/16/16   [name], MD  metoprolol tartrate (LOPRESSOR) 25 MG tablet  Take 1 tablet (25 mg total) by mouth 2 (two) times daily. 10/07/15   [name], MD  nitroGLYCERIN (NITROSTAT) 0.4 MG SL tablet Place 0.4 mg under the tongue every 5 (five) minutes as needed for chest pain.    Historical Provider, MD  omega-3 acid ethyl esters (LOVAZA) 1 g capsule TAKE 2 CAPSULES (2 G TOTAL) BY MOUTH 2 (TWO) TIMES DAILY. 09/19/15   [name], MD  RANEXA 500 MG 12 hr tablet TAKE 1 TABLET (500 MG TOTAL) BY MOUTH 2 (TWO) TIMES DAILY. 05/31/16   [name], MD  rosuvastatin (CRESTOR) 40 MG tablet Take 1 tablet (40 mg total) by mouth daily. 03/23/16   [name], MD  rosuvastatin (CRESTOR) 40 MG tablet TAKE 1 TABLET BY MOUTH DAILY 03/28/16   [name], MD  ticagrelor (BRILINTA) 90 MG TABS tablet Take 1 tablet (90 mg total) by mouth 2 (two) times daily. 08/19/15   [name], MD  vitamin B-12 (CYANOCOBALAMIN) 100 MCG tablet Take 100 mcg by mouth daily.     Historical Provider, MD    Family History Family History  Problem Relation Age of Onset  . Pancreatic cancer Father   . Heart disease Father   . Hypertension Father   . Stroke Mother   . Hypertension Mother   . Coronary artery disease Brother 82    2 bros with early CAD  . Heart disease Brother     x 3  . Throat cancer Brother   . Colon cancer Neg Hx   . Esophageal cancer Neg Hx   . Stomach cancer Neg Hx   . Rectal cancer Neg Hx     Social History Social History  Substance Use Topics  . Smoking status: Former Smoker    Types: Cigarettes    Quit date: 05/07/1973  . Smokeless tobacco: Never Used  . Alcohol use No     Allergies   Patient has no known allergies.   Review of Systems Review of Systems  Constitutional: Negative for fever.  HENT: Negative for ear pain, sore throat and trouble swallowing.   Respiratory: Negative for shortness of breath.   Cardiovascular: Negative for chest pain.  Musculoskeletal: Positive for neck pain.  Neurological: Negative for weakness, numbness and  headaches.     Physical Exam Updated Vital Signs BP 133/81 (BP Location: Right Arm)   Pulse 60   Temp 98.1 F (36.7 C) (Oral)   Resp 16   Ht 5\' 10"  (1.778 m)   Wt 95.3 kg   SpO2 99%   BMI 30.13 kg/m   Physical Exam  Constitutional: He appears well-developed and well-nourished. No distress.  HENT:  Left Ear: External ear normal.  Eyes: Conjunctivae are normal.  Neck: Neck supple. No tracheal deviation present.  No rigidity. Mild pain with turning head towards right. No neck mass or swelling noted.   Cardiovascular: Normal rate.  Pulmonary/Chest: Effort normal. No accessory muscle usage. No respiratory distress.  Musculoskeletal: He exhibits no edema.  C spine non tender. Left cervical and trapezius muscular tenderness. Mild pain w rotating neck/turning to right. No neck swelling or mass noted.   Neurological: He is alert.  Speech clear/fluent. Ambulates with steady gait. LUE motor 5/5. sens grossly intact.   Skin: Skin is warm and dry. He is not diaphoretic.  Psychiatric: He has a normal mood and affect.  Nursing note and vitals reviewed.    ED Treatments / Results  Labs (all labs ordered are listed, but only abnormal results are displayed) Labs Reviewed - No data to display  EKG  EKG Interpretation None       Radiology No results found.  Procedures Procedures (including critical care time)  Medications Ordered in ED Medications  ibuprofen (ADVIL,MOTRIN) tablet 200 mg (not administered)  acetaminophen (TYLENOL) tablet 1,000 mg (not administered)     Initial Impression / Assessment and Plan / ED Course  I have reviewed the triage vital signs and the nursing notes.  Pertinent labs & imaging results that were available during my care of the patient were reviewed by me and considered in my medical decision making (see chart for details).  Symptoms felt most c/w musculoskeletal pain, discussed diff incl ddd, arthritis, nerve impingement, muscle strain.    Discuss will try meds for symptom relief, and to f/u pcp in next couple weeks if symptoms persist, and to discuss imaging then w pcp.   Tylenol po. Motrin po.  Patient current appears stable for d/c.     Final Clinical Impressions(s) / ED Diagnoses   Final diagnoses:  None    New Prescriptions New Prescriptions   No medications on file     Lajean Saver, MD 06/06/16 1032

## 2016-06-07 ENCOUNTER — Ambulatory Visit (INDEPENDENT_AMBULATORY_CARE_PROVIDER_SITE_OTHER): Payer: Medicare Other | Admitting: Family Medicine

## 2016-06-07 ENCOUNTER — Encounter: Payer: Self-pay | Admitting: Family Medicine

## 2016-06-07 VITALS — BP 134/78 | HR 61 | Temp 98.0°F | Ht 70.0 in | Wt 215.8 lb

## 2016-06-07 DIAGNOSIS — M542 Cervicalgia: Secondary | ICD-10-CM

## 2016-06-07 MED ORDER — PREDNISONE 20 MG PO TABS: ORAL_TABLET | ORAL | 0 refills | Status: DC

## 2016-06-07 NOTE — Progress Notes (Signed)
Subjective:  Joshua Hahn is a 71 y.o. year old very pleasant male patient who presents for/with See problem oriented charting ROS- no fever, chills. No hand or arm weakness. No numbness/tingling into the arm.    Past Medical History-  Patient Active Problem List   Diagnosis Date Noted  . CAD - PCI 1980s, CABG X 4 1994, DES CFX 08/2008, stent 2014 nstemi 01/11/2013    Priority: High  . ADENOCARCINOMA, PROSTATE, GLEASON GRADE 7 04/05/2009    Priority: High  . Anosmia 09/28/2014    Priority: Medium  . Chronic renal insufficiency, stage III (moderate) 02/05/2013    Priority: Medium  . OSA on CPAP 01/11/2013    Priority: Medium  . Hyperlipidemia 12/18/2006    Priority: Medium  . Essential hypertension 12/18/2006    Priority: Medium  . Old MI (myocardial infarction) 01/11/2013    Priority: Low  . History of adenomatous polyp of colon 12/10/2014    Medications- reviewed and updated Current Outpatient Prescriptions  Medication Sig Dispense Refill  . aspirin 81 MG tablet Take 81 mg by mouth daily.    . cholecalciferol (VITAMIN D) 1000 UNITS tablet Take 1,000 Units by mouth daily.    . Coenzyme Q10 (CO Q 10) 100 MG CAPS Take 300 mg by mouth every evening.     . isosorbide mononitrate (IMDUR) 60 MG 24 hr tablet Take 1 tablet (60 mg total) by mouth daily. 30 tablet 11  . losartan (COZAAR) 50 MG tablet TAKE 1 TABLET (50 MG TOTAL) BY MOUTH DAILY. 90 tablet 3  . methocarbamol (ROBAXIN) 500 MG tablet Take 2 tablets (1,000 mg total) by mouth 3 (three) times daily as needed for muscle spasms. 20 tablet 0  . metoprolol tartrate (LOPRESSOR) 25 MG tablet Take 1 tablet (25 mg total) by mouth 2 (two) times daily. 180 tablet 2  . nitroGLYCERIN (NITROSTAT) 0.4 MG SL tablet Place 0.4 mg under the tongue every 5 (five) minutes as needed for chest pain.    Marland Kitchen omega-3 acid ethyl esters (LOVAZA) 1 g capsule TAKE 2 CAPSULES (2 G TOTAL) BY MOUTH 2 (TWO) TIMES DAILY. 120 capsule 6  . RANEXA 500 MG 12 hr  tablet TAKE 1 TABLET (500 MG TOTAL) BY MOUTH 2 (TWO) TIMES DAILY. 90 tablet 1  . rosuvastatin (CRESTOR) 40 MG tablet Take 1 tablet (40 mg total) by mouth daily. 30 tablet 11  . ticagrelor (BRILINTA) 90 MG TABS tablet Take 1 tablet (90 mg total) by mouth 2 (two) times daily. 180 tablet 3  . vitamin B-12 (CYANOCOBALAMIN) 100 MCG tablet Take 100 mcg by mouth daily.     . predniSONE (DELTASONE) 20 MG tablet Take 2 pills for 3 days, 1 pill for 4 days 10 tablet 0   No current facility-administered medications for this visit.     Objective: BP 134/78 (BP Location: Left Arm, Patient Position: Sitting, Cuff Size: Large)   Pulse 61   Temp 98 F (36.7 C) (Oral)   Ht 5' 10"  (1.778 m)   Wt 215 lb 12.8 oz (97.9 kg)   SpO2 97%   BMI 30.96 kg/m  Gen: NAD, appears uncomfortable with turning neck. Audible popping sound at times. CV: RRR no murmurs rubs or gallops Lungs: CTAB no crackles, wheeze, rhonchi  Ext: no edema Skin: warm, dry MSK: Spurling reproduces pain but limited in getting to position. No pain into arm. Limited range of motion with head tilted back. Slowed range of motion with neck in neutral position.  Neuro:  5/5 strength bilateral arms, normal sensation.   Assessment/Plan:  Neck pain on left side - Plan: DG Cervical Spine Complete S:3 weeks symptoms including pain in left neck down to left shoulder and into back of left scalp. Pain at worse up to 8/10 steadily worsening. Going to chiropractor for years - never had neck issues. No fall or injury. Trialed massage for hour--> reduced pain to 3/10 then steadily worsened again. Seen in ED yesterday. He was hoping for neck films but none were done. Given muscle relaxant which takes pain down to 4/10. Hears a popping sound when twisting neck which is new for him.  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 Will trial 7 day course of prednisone. Opts to get films tomorrow. We also discussed with some pain  going up into his head- possibility of temporal arteritis but strongly doubt givne position of the pain and the tightness in the neck- likely muscle spasm related to cervical arthritis. Did not think high enough on differential to get ESR/CRP.   Orders Placed This Encounter  Procedures  . DG Cervical Spine Complete    Standing Status:   Future    Standing Expiration Date:   08/05/2017    Order Specific Question:   Reason for Exam (SYMPTOM  OR DIAGNOSIS REQUIRED)    Answer:   left sided neck pain for 3 weeks. suspect DDD spine related    Order Specific Question:   Preferred imaging location?    Answer:   [facility]    Meds ordered this encounter  Medications  . predniSONE (DELTASONE) 20 MG tablet    Sig: Take 2 pills for 3 days, 1 pill for 4 days    Dispense:  10 tablet    Refill:  0    Return precautions advised.  Garret Reddish, MD

## 2016-06-07 NOTE — Patient Instructions (Signed)
Suspect arthritis in the cervical spine (neck portion of the spine)  Prednisone 7 days  Please go to [facility] for films of neck - located [address] from [facility] - in the basement - Hours: 8:30-5:30 PM M-F. Do not need appointment.    Starting October 1st 2018, I will be transferring to our new location: [place] [address], [address] [place] [place] Phone: [phone]  I would love to have you remain my patient at this new location as long as it remains convenient for you. I am excited about the opportunity to have x-ray and sports medicine in the new building but will really miss the awesome staff and physicians at [facility].

## 2016-06-07 NOTE — Progress Notes (Signed)
Pre visit review using our clinic review tool, if applicable. No additional management support is needed unless otherwise documented below in the visit note. 

## 2016-06-08 ENCOUNTER — Ambulatory Visit (INDEPENDENT_AMBULATORY_CARE_PROVIDER_SITE_OTHER)
Admission: RE | Admit: 2016-06-08 | Discharge: 2016-06-08 | Disposition: A | Discharge status: Home / Self Care | Payer: Medicare Other | Source: Ambulatory Visit | Attending: Family Medicine | Admitting: Family Medicine

## 2016-06-08 ENCOUNTER — Encounter: Payer: Self-pay | Admitting: Family Medicine

## 2016-06-08 DIAGNOSIS — M542 Cervicalgia: Secondary | ICD-10-CM

## 2016-06-11 ENCOUNTER — Encounter: Payer: Self-pay | Admitting: Cardiovascular Disease

## 2016-06-12 ENCOUNTER — Ambulatory Visit (INDEPENDENT_AMBULATORY_CARE_PROVIDER_SITE_OTHER)
Admission: RE | Admit: 2016-06-12 | Discharge: 2016-06-12 | Disposition: A | Discharge status: Home / Self Care | Payer: Medicare Other | Source: Ambulatory Visit | Attending: Family Medicine | Admitting: Family Medicine

## 2016-06-12 ENCOUNTER — Other Ambulatory Visit: Payer: Self-pay

## 2016-06-12 ENCOUNTER — Encounter: Payer: Self-pay | Admitting: Family Medicine

## 2016-06-12 DIAGNOSIS — M542 Cervicalgia: Secondary | ICD-10-CM

## 2016-06-12 DIAGNOSIS — I10 Essential (primary) hypertension: Secondary | ICD-10-CM | POA: Diagnosis not present

## 2016-06-14 ENCOUNTER — Telehealth: Payer: Self-pay | Admitting: Family Medicine

## 2016-06-14 NOTE — Telephone Encounter (Signed)
Joshua Hahn pt returned your call. °

## 2016-06-15 ENCOUNTER — Other Ambulatory Visit: Payer: Self-pay

## 2016-06-15 DIAGNOSIS — J181 Lobar pneumonia, unspecified organism: Principal | ICD-10-CM

## 2016-06-15 DIAGNOSIS — J189 Pneumonia, unspecified organism: Secondary | ICD-10-CM

## 2016-06-15 NOTE — Telephone Encounter (Signed)
Spoke to patient yesterday

## 2016-06-28 ENCOUNTER — Encounter: Payer: Self-pay | Admitting: Cardiovascular Disease

## 2016-07-27 DIAGNOSIS — H40023 Open angle with borderline findings, high risk, bilateral: Secondary | ICD-10-CM | POA: Diagnosis not present

## 2016-07-27 DIAGNOSIS — H25013 Cortical age-related cataract, bilateral: Secondary | ICD-10-CM | POA: Diagnosis not present

## 2016-07-27 DIAGNOSIS — H2513 Age-related nuclear cataract, bilateral: Secondary | ICD-10-CM | POA: Diagnosis not present

## 2016-07-27 DIAGNOSIS — H43813 Vitreous degeneration, bilateral: Secondary | ICD-10-CM | POA: Diagnosis not present

## 2016-07-27 LAB — HM DIABETES EYE EXAM

## 2016-08-20 ENCOUNTER — Other Ambulatory Visit: Payer: Self-pay | Admitting: Cardiovascular Disease

## 2016-08-29 ENCOUNTER — Other Ambulatory Visit: Payer: Self-pay | Admitting: Cardiovascular Disease

## 2016-10-16 DIAGNOSIS — C61 Malignant neoplasm of prostate: Secondary | ICD-10-CM | POA: Diagnosis not present

## 2016-10-16 LAB — PSA: PSA: <0.02

## 2016-10-18 ENCOUNTER — Other Ambulatory Visit: Payer: Self-pay | Admitting: *Deleted

## 2016-10-18 ENCOUNTER — Other Ambulatory Visit: Payer: Self-pay | Admitting: Cardiovascular Disease

## 2016-10-18 MED ORDER — METOPROLOL TARTRATE 25 MG PO TABS: 25.0000 mg | ORAL_TABLET | Freq: Two times a day (BID) | ORAL | 0 refills | Status: DC

## 2016-10-22 ENCOUNTER — Encounter: Payer: Self-pay | Admitting: Cardiovascular Disease

## 2016-10-22 ENCOUNTER — Ambulatory Visit (INDEPENDENT_AMBULATORY_CARE_PROVIDER_SITE_OTHER): Payer: Medicare Other | Admitting: Cardiovascular Disease

## 2016-10-22 VITALS — BP 132/88 | HR 58 | Ht 70.0 in | Wt 214.0 lb

## 2016-10-22 DIAGNOSIS — G4733 Obstructive sleep apnea (adult) (pediatric): Secondary | ICD-10-CM

## 2016-10-22 DIAGNOSIS — E785 Hyperlipidemia, unspecified: Secondary | ICD-10-CM | POA: Diagnosis not present

## 2016-10-22 DIAGNOSIS — I10 Essential (primary) hypertension: Secondary | ICD-10-CM | POA: Diagnosis not present

## 2016-10-22 DIAGNOSIS — N183 Chronic kidney disease, stage 3 unspecified: Secondary | ICD-10-CM

## 2016-10-22 DIAGNOSIS — I25810 Atherosclerosis of coronary artery bypass graft(s) without angina pectoris: Secondary | ICD-10-CM | POA: Diagnosis not present

## 2016-10-22 DIAGNOSIS — Z9989 Dependence on other enabling machines and devices: Secondary | ICD-10-CM | POA: Diagnosis not present

## 2016-10-22 MED ORDER — NITROGLYCERIN 0.4 MG SL SUBL: 0.4000 mg | SUBLINGUAL_TABLET | SUBLINGUAL | 3 refills | Status: DC | PRN

## 2016-10-22 MED ORDER — TICAGRELOR 60 MG PO TABS: 60.0000 mg | ORAL_TABLET | Freq: Two times a day (BID) | ORAL | 3 refills | Status: DC

## 2016-10-22 NOTE — Progress Notes (Signed)
Patient ID: Joshua Hahn, male   DOB: 1945/11/05, 71 y.o.   MRN: [id number]    HPI: Joshua Hahn is a 71 y.o. male who presents for a 7 month follow-up cardiology evaluation.   Joshua Hahn has CAD dating back to 1988 at which time I performed PTCA of his distal RCA. 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  In 2010 he developed unstable angina and was found to have significant native CAD with total occlusion of the LAD at its ostium, diffuse AV groove circumflex stenoses, 99% stenosis in a diffusely diseased a 1 vessel with 95% on 2 stenosis. The distal limb of the graft was opacified between OM1-2 but had subtotal occlusion. He had an occluded proximal limb of the graft from the aorta and consequently the entire circumflex territory was in jeopardy. He had native RCA occlusion as well as an occluded graft to the RCA. The RCA was collateralized from the circumflex vessel. He had a patent LIMA graft to the diagonal vessel and patent vein graft to the LAD. On  08/23/2008 he underwent complex intervention to his native circumflex system utilizing high-speed rotational atherectomy of his diffusely diseased native circumflex vessel with ultimate insertion of a 2.5x28 mm Xience DES stent proximally and 2.25 Taxus stent more distally in tandem.  In January 2013 a nuclear perfusion study remained low risk but did show previously noted fixed basal to mid inferior scar. He has a history of prostate CA, status post laparoscopic radical prostatectomy.  Joshua Hahn developed recurrent episodes of chest pain symptoms which led to hospitalization on 02/04/2013. Catheterization revealed a 95% focal in-stent restenosis in the  distal third of the stent in the circumflex vessel and he also had an 85-90% near ostial stenosis in the vein graft supplying the mid LAD. He was hydrated following his catheterization and on up to over 2 2014 underwent two-vessel intervention to the native circumflex vessel for his in-stent restenosis enteroscope/PTCA in the 95% stenosis reduced to 0%, and PTCA/stenting of the ostium in the vein graft supplying the LAD ultimate insertion of a 3.5x15 mm size expedition DES stent was dilated 3.6 mm. The stenosis was reduced to 0%. Subsequently, he has felt significantly improved. He has more energy. He denies shortness of breath with activity. He denies recurrent chest pain  Additional problems include hypertension, mixed hyperlipidemia, obstructive sleep apnea CPAP therapy, prostate cancer, status post radical prostatectomy laparoscopically performed by Dr. Rosana Hahn and left parotid gland surgery. I  stressed the importance that he continue to use his CPAP therapy with 100% compliant. I also reinitiated ARB therapy with losartan, although the past he had been on Diovan but due to cost issues his insurance company would no longer cover this.   An NMR profile in 2014 showed significant improvement with a previous LDL particle #2733  being reduced to 1491. Calculated LDL cholesterol was reduced from 170 to 91. Triglycerides although improved from 289 to 215 were still elevated. Small particles were markedly reduced but still elevated at 859, where previously these had been 1823. Insulin resistance were still elevated at 64.  He feels that he has been doing exceptionally well.  He denies any chest pain or shortness of breath.  He  exercises regularly and walks 5 miles at least 3-4 days per week.  He feels that he has had the most energy that he has had over the last several years.  He admits to 100% CPAP compliance.  He oftentimes is working up to 12 hours per day and denies any hypersomnolence or daytime fatigue.  He  is unaware of any palpitations.  Occasionally, he notes some trivial ankle edema.  He denies excessive bleeding.    He sees Dr. Gaynelle Arabian for urologic follow-up evaluation.  He last laboratory was in April 2017.  He has mild renal insufficiency, but his creatinine had improved to 1.47.  He has continued to take Crestor 40 mg daily for hyperlipidemia.  He is on dual antiplatelet therapy with aspirin and Brilinta.  He is not having anginal symptoms on isosorbide, metoprolol, in addition to Ranexa 500 mg twice a day.    Since I last saw him in November 2017, he has continued to do well.  He denies any recurrent episodes of chest pain or shortness of breath.  He admits to 100% compliance with CPAP.  He has been trying to exercise.  He continues to work.  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  Unfortunately, his grandson died unexpectedly in 24-Jun-2016.  This was very difficult.  He presents for evaluation.  Past Medical History:  Diagnosis Date  . Coronary artery disease 1994   PCI'80s, CABG '94, CFX DES 4/10  . Diverticulosis   . HTN (hypertension) 10/16/2011   Echo -EF 50-55% ,LV NORMAL  . 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 levels LDL IIIa+b% and HDL2b%  . Hypertension   . Myocardial infarct ([place])   . Myocardial infarction [facility]) 1994   inferior wall MI at [facility]  . Prostate cancer ([place])   . Sleep apnea    uses CPAP PRN  . Sleep apnea, obstructive    using C-PAP    Past Surgical History:  Procedure Laterality Date  . ABDOMINAL SURGERY    . CARDIAC CATHETERIZATION  06/23/2008   occlusion sequential OM1 andOM2 graft as well vein graft to RCA.LAD and diagonal system remain intact. RCA  collateralized  . CORONARY ANGIOPLASTY WITH STENT PLACEMENT    . CORONARY ARTERY BYPASS GRAFT  1994  . EXPLORATORY LAPAROTOMY     ideopathic  retroperitoneal fibrosis  . LEFT HEART CATHETERIZATION WITH CORONARY/GRAFT ANGIOGRAM N/A 02/04/2013   Procedure: LEFT HEART CATHETERIZATION WITH Beatrix Fetters;  Surgeon: [name], MD;  Location: [facility] CATH LAB;  Service: Cardiovascular;  Laterality: N/A;  . LEFT PAROTID GLAND SURGRY  20/2542   Dr. Erik Obey  . PERCUTANEOUS CORONARY STENT INTERVENTION (PCI-S)  06/25/2008   rotational atherectomy w/diffuse disease native circ. w/drug eluting stents , PTCA on one vessel; grat occlusion to RCA w/native RCA proxim. occlusion. Circ.supplied collaaterals distal RCA  following intervention  . PERCUTANEOUS CORONARY STENT INTERVENTION (PCI-S) N/A 02/05/2013   Procedure: PERCUTANEOUS CORONARY STENT INTERVENTION (PCI-S);  Surgeon: [name], MD;  Location: [facility] C[facility] LAB;  Service: Cardiovascular;  Laterality: N/A;  . PROSTATECTOMY  06/2009   robotic-asssisted laparoscopic radical by Dr Joshua Hahn    No Known Allergies  Current Outpatient Prescriptions  Medication Sig Dispense Refill  . aspirin 81 MG tablet Take 81 mg by mouth daily.    . cholecalciferol (VITAMIN D) 1000 UNITS tablet Take 1,000 Units by mouth daily.    Marland Kitchen  Coenzyme Q10 (CO Q 10) 100 MG CAPS Take 300 mg by mouth every evening.     . isosorbide mononitrate (IMDUR) 60 MG 24 hr tablet Take 1 tablet (60 mg total) by mouth daily. 30 tablet 11  . losartan (COZAAR) 50 MG tablet TAKE 1 TABLET (50 MG TOTAL) BY MOUTH DAILY. 90 tablet 3  . metoprolol tartrate (LOPRESSOR) 25 MG tablet Take 1 tablet (25 mg total) by mouth 2 (two) times daily. 180 tablet 0  . nitroGLYCERIN (NITROSTAT) 0.4 MG SL tablet Place 1 tablet (0.4 mg total) under the tongue every 5 (five) minutes as needed for chest pain. 75 tablet 3  . omega-3 acid ethyl esters (LOVAZA) 1 g capsule TAKE 2 CAPSULES (2 G TOTAL) BY MOUTH 2 (TWO) TIMES DAILY. 120 capsule 6  . RANEXA 500 MG 12 hr tablet TAKE 1 TABLET (500 MG TOTAL) BY MOUTH 2 (TWO) TIMES DAILY. 90 tablet 1  . rosuvastatin  (CRESTOR) 40 MG tablet Take 1 tablet (40 mg total) by mouth daily. 30 tablet 11  . ticagrelor (BRILINTA) 60 MG TABS tablet Take 1 tablet (60 mg total) by mouth 2 (two) times daily. 180 tablet 3  . vitamin B-12 (CYANOCOBALAMIN) 100 MCG tablet Take 100 mcg by mouth daily.      No current facility-administered medications for this visit.     Social History   Social History  . Marital status: Married    Spouse name: N/A  . Number of children: 2  . Years of education: N/A   Occupational History  . pastor    Social History Main Topics  . Smoking status: Former Smoker    Types: Cigarettes    Quit date: 05/07/1973  . Smokeless tobacco: Never Used  . Alcohol use No  . Drug use: No  . Sexual activity: Not on file   Other Topics Concern  . Not on file   Social History Narrative   ** Merged History Encounter **       Married 48 years in 2016. 2 children. 5 grand kids.   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   Hobbies: speaking events, traveling   Socially he is married. He has 2 children 5 grandchildren. 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 There is no tobacco use. He does travel.  Continues to be active and recently ran several conferences for [employer].  He still preaches. He will be traveling to [place] this year.  Family History  Problem Relation Age of Onset  . Pancreatic cancer Father   . Heart disease Father   . Hypertension Father   . Stroke Mother   . Hypertension Mother   . Coronary artery disease Brother 29       2 bros with early CAD  . Heart disease Brother        x 3  . Throat cancer Brother   . Colon cancer Neg Hx   . Esophageal cancer Neg Hx   . Stomach cancer Neg Hx   . Rectal cancer Neg Hx     ROS General: Negative; No fevers, chills, or night sweats;  HEENT: Negative; No changes in vision or hearing, sinus congestion,  difficulty swallowing Pulmonary: Negative; No cough, wheezing, shortness of breath, hemoptysis Cardiovascular: See HPI GI: Negative; No nausea, vomiting, diarrhea, or abdominal pain [id number] history of prostate CA Musculoskeletal: Negative; no myalgias,  joint pain, or weakness Hematologic/Oncology: Negative; no easy bruising, bleeding Endocrine: Negative; no heat/cold intolerance; no diabetes Neuro: Negative; no changes in balance, headaches Skin: Negative; No rashes or skin lesions Psychiatric: Negative; No behavioral problems, depression Sleep: Positive for OSA on CPAP.  No snoring, daytime sleepiness, hypersomnolence, bruxism, restless legs, hypnogognic hallucinations, no cataplexy Other comprehensive 14 point system review is negative.  PE BP 132/88   Pulse (!) 58   Ht 5' 10"  (1.778 m)   Wt 214 lb (97.1 kg)   BMI 30.71 kg/m   Wt Readings from Last 3 Encounters:  10/22/16 214 lb (97.1 kg)  06/07/16 215 lb 12.8 oz (97.9 kg)  06/06/16 210 lb (95.3 kg)   General: Alert, oriented, no distress.  Skin: normal turgor, no rashes, warm and dry HEENT: Normocephalic, atraumatic. Pupils equal round and reactive to light; sclera anicteric; extraocular muscles intact;  Nose without nasal septal hypertrophy Mouth/Parynx benign; Mallinpatti scale 3 Neck: No JVD, no carotid bruits; normal carotid upstroke Lungs: clear to ausculatation and percussion; no wheezing or rales Chest wall: without tenderness to palpitation Heart: PMI not displaced, RRR, s1 s2 normal, 7-4/7 systolic murmur, no diastolic murmur, no rubs, gallops, thrills, or heaves Abdomen: Diastases recti; soft, nontender; no hepatosplenomehaly, BS+; abdominal aorta nontender and not dilated by palpation. Back: no CVA tenderness Pulses 2+ Musculoskeletal: full range of motion, normal strength, no joint deformities Extremities: no clubbing cyanosis or edema, Homan's sign negative  Neurologic: grossly nonfocal; Cranial nerves  grossly wnl Psychologic: Normal mood and affect   ECG (independently read by me): Sinus bradycardia 58 bpm.  First-degree AV block with PR interval 246 ms.  Nonspecific T wave changes in leads V5 and V6.  Inferior Q waves, old  November 2017 ECG (independently read by me): Normal sinus rhythm at 65 bpm.  First degree AV block with PR interval 240 ms.  Small inferior Q wave compatible with his prior inferior MI.  April  2017 ECG (independently read by me): Sinus bradycardia 57 bpm.  First-degree AV block with a PR interval 264 ms.  Previously noted T-wave changes inferolaterally.  July 2016 ECG (independently read by me): Normal sinus rhythm at 64 bpm.  Nondiagnostic T-wave changes inferolaterally.  First-degree AV block with a PR interval at 238 ms.  January 2016 ECG (independently read by me): Normal sinus rhythm at 60 bpm.  First-degree AV block with a PR interval at 236 ms.  Nonspecific ST changes  December 2014 ECG: Sinus rhythm at 61 beats per minute. Previously old Q wave in 3. Nondiagnostic ST changes   LABS: BMP Latest Ref Rng & Units 08/17/2015 07/25/2015 11/22/2014  Glucose 65 - 99 mg/dL 101(H) 118(H) 100(H)  BUN 7 - 25 mg/dL 8 13 11   Creatinine 0.70 - 1.18 mg/dL 1.46(H) 1.97(H) 1.62(H)  Sodium 135 - 146 mmol/L 137 134(L) 140  Potassium 3.5 - 5.3 mmol/L 5.2 4.2 4.6  Chloride 98 - 110 mmol/L 102 105 105  CO2 20 - 31 mmol/L 28 23 27   Calcium 8.6 - 10.3 mg/dL 9.8 8.7(L) 9.9   Hepatic Function Latest Ref Rng & Units 08/17/2015 11/22/2014 04/13/2013  Total Protein 6.1 - 8.1 g/dL 6.9 7.1 7.5  Albumin 3.6 - 5.1 g/dL 4.1 4.0 4.4  AST 10 - 35 U/L 24 21 24   ALT 9 - 46 U/L 20 16 20   Alk Phosphatase 40 - 115 U/L 62 61 60  Total Bilirubin 0.2 - 1.2 mg/dL 0.9 0.7 0.9  Bilirubin, Direct 0.0 - 0.3 mg/dL - - -  CBC Latest Ref Rng & Units 08/17/2015 07/25/2015 11/22/2014  WBC 3.8 - 10.8 K/uL 6.8 7.9 6.7  Hemoglobin 13.2 - 17.1 g/dL 14.2 13.8 14.1  Hematocrit 38.5 - 50.0 % 42.8 41.8 42.7    Platelets 140 - 400 K/uL 230 172 208   Lab Results  Component Value Date   MCV 90.1 08/17/2015   MCV 90.7 07/25/2015   MCV 89.3 11/22/2014   Lab Results  Component Value Date   TSH 1.90 08/17/2015   Lab Results  Component Value Date   HGBA1C (H) 10/23/2009    6.4 (NOTE)                                                                       According to the ADA Clinical Practice Recommendations for 2011, when HbA1c is used as a screening test:   >=6.5%   Diagnostic of Diabetes Mellitus           (if abnormal result  is confirmed)  5.7-6.4%   Increased risk of developing Diabetes Mellitus  References:Diagnosis and Classification of Diabetes Mellitus,Diabetes OZDG,6440,34(VQQVZ 1):S62-S69 and Standards of Medical Care in         Diabetes - 2011,Diabetes Care,2011,34  (Suppl 1):S11-S61.   Lipid Panel     Component Value Date/Time   CHOL 136 08/17/2015 1158   CHOL 137 11/22/2014 1117   TRIG 158 (H) 08/17/2015 1158   TRIG 160 (H) 11/22/2014 1117   TRIG 216 (HH) 03/04/2006 0946   HDL 40 08/17/2015 1158   HDL 43 11/22/2014 1117   CHOLHDL 3.4 08/17/2015 1158   VLDL 32 (H) 08/17/2015 1158   LDLCALC 64 08/17/2015 1158   LDLCALC 62 11/22/2014 1117   LDLDIRECT 135.5 02/27/2012 1718     RADIOLOGY: No results found.  IMPRESSION:   1. Coronary artery disease involving coronary bypass graft of native heart without angina pectoris   2. Essential hypertension   3. Chronic renal insufficiency, stage III (moderate)   4. OSA on CPAP   5. Hyperlipidemia with target LDL less than 70     ASSESSMENT AND PLAN: Rev. Zwilling is a 71 year-old African-American male who is 30 years following his initial PTCA of his distal RCA and 24 years following his inferior wall myocardial infarction and subsequent CABG revascularization surgery. He is status post complex rotational atherectomy and diffuse stenting of his native circumflex vessel which also supplied his RCA territory. He  developed unstable  anginal symptoms in October 2014 and had mild positive troponin ruling in for non-ST segment MI. Catheterization in 02/04/2013 showed focal 95% in-stent restenosis in the distal third of the previously placed stent in the circumflex vessel. This stenosis occurred after the collateral  of the circumflex supplying the distal right coronary artery. He also had high grade 85-90% near ostial stenosis in the vein graft supplying the LAD. Fortunately he underwent successful two-vessel coronary intervention with cutting balloon angiosculpt to the circumflex in-stent restenosis, and PTCA/insertion of a new DES stent in the ostium of the vein graft supplying the LAD.  Since his last interventions he has not had any recurrent anginal symptomatomology on his antianginal regimen consisting of Ranexa 500 mg twice a day, Toprol 25 mg twice a day, losartan 50 g daily,  and isosorbide mononitrate 60 mg.  his blood pressure today is 132/88, repeat by me was 132/78.  He continues to be on aspirin and Brilinta for antiplatelet therapy.  When his current dose runs out.  I reviewed the Pegasys trial data with him and will reduce his Brilinta dose to 60 mg twice a day.  He continues to be on a high potency statin with Crestor 40 mg and low vase at 2 capsules twice a day with target LDL less than 70.  His most recent lipid panel was improved in 2017 with an LDL of 64.  He has history of renal insufficiency in March 2017.  His creatinine had risen to 1.97, but improved to 1.46 last year.  Fasting laboratory will be obtained.  He continues to use CPAP with 100% compliance.  Although was very difficult for him to initiate therapy.  Now that he has been using it consistently.  He will not sleep without it.  As long as he remains stable I will see him in 6 months for reevaluation.  [name], MD, [facility]  10/24/2016 10:29 PM

## 2016-10-22 NOTE — Patient Instructions (Addendum)
Medication Instructions:   DECREASE BRILINTA TO 60 MG TWICE DAILY  Labwork:  Your physician recommends that you return for lab work WHEN FASTING  Follow-Up:  Your physician wants you to follow-up in: [place] will receive a reminder letter in the mail two months in advance. If you don't receive a letter, please call our office to schedule the follow-up appointment.   If you need a refill on your cardiac medications before your next appointment, please call your pharmacy.

## 2016-10-23 DIAGNOSIS — N5201 Erectile dysfunction due to arterial insufficiency: Secondary | ICD-10-CM | POA: Diagnosis not present

## 2016-10-23 DIAGNOSIS — N393 Stress incontinence (female) (male): Secondary | ICD-10-CM | POA: Diagnosis not present

## 2016-10-23 DIAGNOSIS — C61 Malignant neoplasm of prostate: Secondary | ICD-10-CM | POA: Diagnosis not present

## 2016-10-28 ENCOUNTER — Other Ambulatory Visit: Payer: Self-pay | Admitting: Cardiovascular Disease

## 2016-10-29 ENCOUNTER — Encounter: Payer: Self-pay | Admitting: Family Medicine

## 2016-12-19 ENCOUNTER — Other Ambulatory Visit: Payer: Self-pay | Admitting: Cardiovascular Disease

## 2017-01-08 ENCOUNTER — Telehealth: Payer: Self-pay | Admitting: Family Medicine

## 2017-01-08 NOTE — Telephone Encounter (Signed)
Pt needs a copy of immunization records. Please call wife when ready for pick up. Pt we be going to [place]

## 2017-01-08 NOTE — Telephone Encounter (Signed)
Pt wife came by and picked up forms at front desk.

## 2017-01-08 NOTE — Telephone Encounter (Signed)
Report printed and placed at front desk for pt pick up. I called patient and made him aware

## 2017-01-14 DIAGNOSIS — Z23 Encounter for immunization: Secondary | ICD-10-CM | POA: Diagnosis not present

## 2017-01-15 ENCOUNTER — Other Ambulatory Visit: Payer: Self-pay | Admitting: Cardiovascular Disease

## 2017-01-15 ENCOUNTER — Other Ambulatory Visit: Payer: Self-pay | Admitting: Internal Medicine

## 2017-01-25 DIAGNOSIS — H524 Presbyopia: Secondary | ICD-10-CM | POA: Diagnosis not present

## 2017-01-25 DIAGNOSIS — H02839 Dermatochalasis of unspecified eye, unspecified eyelid: Secondary | ICD-10-CM | POA: Diagnosis not present

## 2017-01-25 DIAGNOSIS — H1013 Acute atopic conjunctivitis, bilateral: Secondary | ICD-10-CM | POA: Diagnosis not present

## 2017-01-25 DIAGNOSIS — H40023 Open angle with borderline findings, high risk, bilateral: Secondary | ICD-10-CM | POA: Diagnosis not present

## 2017-03-30 ENCOUNTER — Other Ambulatory Visit: Payer: Self-pay | Admitting: Cardiovascular Disease

## 2017-04-05 ENCOUNTER — Other Ambulatory Visit: Payer: Self-pay | Admitting: Cardiovascular Disease

## 2017-04-18 ENCOUNTER — Other Ambulatory Visit: Payer: Self-pay | Admitting: Cardiovascular Disease

## 2017-04-21 ENCOUNTER — Other Ambulatory Visit: Payer: Self-pay | Admitting: Cardiovascular Disease

## 2017-04-23 ENCOUNTER — Encounter: Payer: Self-pay | Admitting: Cardiovascular Disease

## 2017-04-23 ENCOUNTER — Ambulatory Visit (INDEPENDENT_AMBULATORY_CARE_PROVIDER_SITE_OTHER): Payer: Medicare Other | Admitting: Cardiovascular Disease

## 2017-04-23 VITALS — BP 156/82 | HR 66 | Ht 70.0 in | Wt 221.0 lb

## 2017-04-23 DIAGNOSIS — I25810 Atherosclerosis of coronary artery bypass graft(s) without angina pectoris: Secondary | ICD-10-CM | POA: Diagnosis not present

## 2017-04-23 DIAGNOSIS — I10 Essential (primary) hypertension: Secondary | ICD-10-CM

## 2017-04-23 DIAGNOSIS — N183 Chronic kidney disease, stage 3 unspecified: Secondary | ICD-10-CM

## 2017-04-23 DIAGNOSIS — G4733 Obstructive sleep apnea (adult) (pediatric): Secondary | ICD-10-CM

## 2017-04-23 DIAGNOSIS — Z951 Presence of aortocoronary bypass graft: Secondary | ICD-10-CM

## 2017-04-23 DIAGNOSIS — Z9989 Dependence on other enabling machines and devices: Secondary | ICD-10-CM | POA: Diagnosis not present

## 2017-04-23 DIAGNOSIS — Z79899 Other long term (current) drug therapy: Secondary | ICD-10-CM | POA: Diagnosis not present

## 2017-04-23 DIAGNOSIS — E785 Hyperlipidemia, unspecified: Secondary | ICD-10-CM | POA: Diagnosis not present

## 2017-04-23 MED ORDER — LOSARTAN POTASSIUM 50 MG PO TABS
75.0000 mg | ORAL_TABLET | Freq: Every day | ORAL | 3 refills | Status: DC
Start: 2017-04-23 — End: 2017-09-25

## 2017-04-23 MED ORDER — METOPROLOL TARTRATE 25 MG PO TABS: ORAL_TABLET | ORAL | 3 refills | Status: DC

## 2017-04-23 NOTE — Patient Instructions (Signed)
Medication Instructions:  INCREASE Losartan to 75 mg (1.5 tablets) daily  INCREASE metoprolol to 25 mg (1 tablet) in the AM and 12.5 mg (1/2 tablet) in the PM  Labwork: Please return for FASTING labs after the holidays (CMET, CBC, Lipid, TSH)  Follow-Up: Your physician wants you to follow-up in: 6 months with Dr. Claiborne Billings. You will receive a reminder letter in the mail two months in advance. If you don't receive a letter, please call our office to schedule the follow-up appointment.    If you need a refill on your cardiac medications before your next appointment, please call your pharmacy.
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
## 2017-05-02 ENCOUNTER — Other Ambulatory Visit: Payer: Self-pay

## 2017-05-02 MED ORDER — ISOSORBIDE MONONITRATE ER 60 MG PO TB24: 60.0000 mg | ORAL_TABLET | Freq: Every day | ORAL | 2 refills | Status: DC

## 2017-07-10 ENCOUNTER — Encounter: Payer: Self-pay | Admitting: Physician Assistant

## 2017-07-10 ENCOUNTER — Ambulatory Visit (INDEPENDENT_AMBULATORY_CARE_PROVIDER_SITE_OTHER): Payer: Medicare Other | Admitting: Physician Assistant

## 2017-07-10 VITALS — BP 124/70 | HR 59 | Temp 97.9°F | Ht 70.0 in | Wt 223.5 lb

## 2017-07-10 DIAGNOSIS — Z23 Encounter for immunization: Secondary | ICD-10-CM | POA: Diagnosis not present

## 2017-07-10 DIAGNOSIS — H9190 Unspecified hearing loss, unspecified ear: Secondary | ICD-10-CM | POA: Diagnosis not present

## 2017-07-10 DIAGNOSIS — D229 Melanocytic nevi, unspecified: Secondary | ICD-10-CM

## 2017-07-10 NOTE — Patient Instructions (Signed)
It was great to meet you!  You will be contacted about your referral to Dermatology and the Ear Doctor.

## 2017-07-10 NOTE — Progress Notes (Signed)
Joshua Hahn is a 72 y.o. male here for a new problem.  I acted as a Education administrator for [employer], PA-C Anselmo Pickler, LPN  History of Present Illness:   Chief Complaint  Patient presents with  . Nevus    Other  This is a new problem. Episode onset: Pt here today to have Nevus checked, pt has 3 one on left cheek and 2 on chest, they have been there for 3 months. Progression since onset: all 3 nevus have grown in size. Associated symptoms comments: The nevus are very sensitive, no bleeding.. Nothing aggravates the symptoms. He has tried nothing for the symptoms.   He does not wear sunscreen. Denies recent prolonged sun exposure.  Hearing Loss He reports bilateral hearing loss that he has noticed over the past few years. He reports that his two brothers both have hearing aids and they are younger than he is. He denies any loud noise exposure recently or chronically, but does note that he was exposed to loud noises while he was in [place].    Past Medical History:  Diagnosis Date  . Coronary artery disease 1994   PCI'80s, CABG '94, CFX DES 4/10  . Diverticulosis   . HTN (hypertension) 10/16/2011   Echo -EF 50-55% ,LV NORMAL  . 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 levels LDL IIIa+b% and HDL2b%  . Hypertension   . Myocardial infarct ([place])   . Myocardial infarction [facility]) 1994   inferior [facility]  . Prostate cancer ([place])   . Sleep apnea    uses CPAP PRN  . Sleep apnea, obstructive    using C-PAP     Social History   Socioeconomic History  . Marital status: Married    Spouse name: Not on file  . Number of children: 2  . Years of education: Not on file  . Highest education level: Not on file  Social Needs  . Financial resource strain: Not on file  . Food insecurity - worry: Not on file  . Food insecurity - inability: Not on file  . Transportation needs - medical:  Not on file  . Transportation needs - non-medical: Not on file  Occupational History  . Occupation: pastor  Tobacco Use  . Smoking status: Former Smoker    Types: Cigarettes    Last attempt to quit: 05/07/1973    Years since quitting: 44.2  . Smokeless tobacco: Never Used  Substance and Sexual Activity  . Alcohol use: No  . Drug use: No  . Sexual activity: Not on file  Other Topics Concern  . Not on file  Social History Narrative   ** Merged History Encounter **       Married 48 years in 2016. 2 children. 5 grand kids.   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   Hobbies: speaking events, traveling    Past Surgical History:  Procedure Laterality Date  . ABDOMINAL SURGERY    . CARDIAC CATHETERIZATION  06/23/2008   occlusion sequential OM1 andOM2 graft as well vein graft to RCA.LAD and diagonal system remain intact. RCA  collateralized  . CORONARY ANGIOPLASTY WITH STENT PLACEMENT    . CORONARY ARTERY BYPASS GRAFT  1994  . EXPLORATORY LAPAROTOMY     ideopathic retroperitoneal fibrosis  . LEFT HEART CATHETERIZATION WITH CORONARY/GRAFT ANGIOGRAM N/A 02/04/2013   Procedure: LEFT HEART CATHETERIZATION WITH Beatrix Fetters;  Surgeon: [name], MD;  Location:  [facility];  Service: Cardiovascular;  Laterality: N/A;  . LEFT PAROTID GLAND SURGRY  96/2952   Dr. Erik Obey  . PERCUTANEOUS CORONARY STENT INTERVENTION (PCI-S)  06/25/2008   rotational atherectomy w/diffuse disease native circ. w/drug eluting stents , PTCA on one vessel; grat occlusion to RCA w/native RCA proxim. occlusion. Circ.supplied collaaterals distal RCA  following intervention  . PERCUTANEOUS CORONARY STENT INTERVENTION (PCI-S) N/A 02/05/2013   Procedure: PERCUTANEOUS CORONARY STENT INTERVENTION (PCI-S);  Surgeon: [name], MD;  Location: [facility] CATH LAB;  Service: Cardiovascular;  Laterality: N/A;  . PROSTATECTOMY  06/2009   robotic-asssisted laparoscopic radical by Dr Rosana Hoes    Family History  Problem  Relation Age of Onset  . Pancreatic cancer Father   . Heart disease Father   . Hypertension Father   . Stroke Mother   . Hypertension Mother   . Coronary artery disease Brother 4       2 bros with early CAD  . Heart disease Brother        x 3  . Throat cancer Brother   . Colon cancer Neg Hx   . Esophageal cancer Neg Hx   . Stomach cancer Neg Hx   . Rectal cancer Neg Hx     No Known Allergies  Current Medications:   Current Outpatient Medications:  .  aspirin 81 MG tablet, Take 81 mg by mouth daily., Disp: , Rfl:  .  cholecalciferol (VITAMIN D) 1000 UNITS tablet, Take 1,000 Units by mouth daily., Disp: , Rfl:  .  Coenzyme Q10 (CO Q 10) 100 MG CAPS, Take 300 mg by mouth every evening. , Disp: , Rfl:  .  isosorbide mononitrate (IMDUR) 60 MG 24 hr tablet, Take 1 tablet (60 mg total) by mouth daily., Disp: 30 tablet, Rfl: 2 .  losartan (COZAAR) 50 MG tablet, Take 1.5 tablets (75 mg total) by mouth daily., Disp: 135 tablet, Rfl: 3 .  metoprolol tartrate (LOPRESSOR) 25 MG tablet, Take 1 tablet (25 mg total) by mouth every morning AND 0.5 tablets (12.5 mg total) every evening., Disp: 135 tablet, Rfl: 3 .  nitroGLYCERIN (NITROSTAT) 0.4 MG SL tablet, Place 1 tablet (0.4 mg total) under the tongue every 5 (five) minutes as needed for chest pain., Disp: 75 tablet, Rfl: 3 .  omega-3 acid ethyl esters (LOVAZA) 1 g capsule, TAKE 2 CAPSULES (2 G TOTAL) BY MOUTH 2 (TWO) TIMES DAILY., Disp: 120 capsule, Rfl: 6 .  RANEXA 500 MG 12 hr tablet, TAKE 1 TABLET (500 MG TOTAL) BY MOUTH 2 (TWO) TIMES DAILY., Disp: 180 tablet, Rfl: 2 .  rosuvastatin (CRESTOR) 40 MG tablet, Take 1 tablet (40 mg total) by mouth daily., Disp: 30 tablet, Rfl: 11 .  rosuvastatin (CRESTOR) 40 MG tablet, TAKE 1 TABLET BY MOUTH DAILY, Disp: 30 tablet, Rfl: 4 .  ticagrelor (BRILINTA) 60 MG TABS tablet, Take 1 tablet (60 mg total) by mouth 2 (two) times daily., Disp: 180 tablet, Rfl: 3 .  vitamin B-12 (CYANOCOBALAMIN) 100 MCG tablet,  Take 100 mcg by mouth daily. , Disp: , Rfl:    Review of Systems:   ROS  Negative unless otherwise specified per HPI.   Vitals:   Vitals:   07/10/17 1039  BP: 124/70  Pulse: (!) 59  Temp: 97.9 F (36.6 C)  TempSrc: Oral  SpO2: 97%  Weight: 223 lb 8 oz (101.4 kg)  Height: 5\' 10"  (1.778 m)     Body mass index is 32.07 kg/m.  Physical Exam:  Physical Exam  Constitutional: He is oriented to person, place, and time. He appears well-developed and well-nourished.  HENT:  Head: Normocephalic.  Right Ear: Tympanic membrane, external ear and ear canal normal. Tympanic membrane is not perforated, not erythematous and not retracted.  Left Ear: Tympanic membrane, external ear and ear canal normal. Tympanic membrane is not perforated, not erythematous and not retracted.  Eyes: Pupils are equal, round, and reactive to light.  Neck: Normal range of motion.  Pulmonary/Chest: Effort normal.  Musculoskeletal: Normal range of motion.  Neurological: He is alert and oriented to person, place, and time.  Skin: Skin is warm and dry.  3 flesh-colored nevi: L cheekbone -- 51mm pedunculated area R shoulder -- 42mm x 40mm papule with irregular borders L upper chest -- 63mm x 100mm paplule with irregular borders   Psychiatric: He has a normal mood and affect. His speech is normal and behavior is normal. Thought content normal.  Nursing note and vitals reviewed.     Assessment and Plan:    Joren was seen today for nevus.  Diagnoses and all orders for this visit:  Multiple nevi I did not take any skin biopsies during today's visit as one of the areas was on his face, I will defer to dermatology, patient is understanding and agreeable to this. -     Ambulatory referral to Dermatology  Need for prophylactic vaccination against Streptococcus pneumoniae (pneumococcus) -     Pneumococcal polysaccharide vaccine 23-valent greater than or equal to 2yo subcutaneous/IM  Hearing loss, unspecified  hearing loss type, unspecified laterality Patient would like to go to audiology for further evaluation for hearing aids. -     Ambulatory referral to ENT   . Reviewed expectations re: course of current medical issues. . Discussed self-management of symptoms. . Outlined signs and symptoms indicating need for more acute intervention. . Patient verbalized understanding and all questions were answered. . See orders for this visit as documented in the electronic medical record. . Patient received an After-Visit Summary.  CMA or LPN served as scribe during this visit. History, Physical, and Plan performed by medical provider. Documentation and orders reviewed and attested to.  Inda Coke, PA-C

## 2017-07-19 DIAGNOSIS — H35033 Hypertensive retinopathy, bilateral: Secondary | ICD-10-CM | POA: Diagnosis not present

## 2017-07-19 DIAGNOSIS — H40023 Open angle with borderline findings, high risk, bilateral: Secondary | ICD-10-CM | POA: Diagnosis not present

## 2017-07-19 DIAGNOSIS — H2513 Age-related nuclear cataract, bilateral: Secondary | ICD-10-CM | POA: Diagnosis not present

## 2017-07-19 DIAGNOSIS — I7090 Unspecified atherosclerosis: Secondary | ICD-10-CM | POA: Diagnosis not present

## 2017-08-03 ENCOUNTER — Other Ambulatory Visit: Payer: Self-pay | Admitting: Cardiovascular Disease

## 2017-09-17 ENCOUNTER — Other Ambulatory Visit: Payer: Self-pay | Admitting: Cardiovascular Disease

## 2017-09-25 ENCOUNTER — Other Ambulatory Visit: Payer: Self-pay | Admitting: Cardiovascular Disease

## 2017-09-25 NOTE — Telephone Encounter (Signed)
Rx(s) sent to pharmacy electronically.  

## 2017-10-02 ENCOUNTER — Ambulatory Visit: Payer: Medicare Other | Attending: Physician Assistant | Admitting: Audiology

## 2017-10-02 DIAGNOSIS — H903 Sensorineural hearing loss, bilateral: Secondary | ICD-10-CM | POA: Insufficient documentation

## 2017-10-02 DIAGNOSIS — H93299 Other abnormal auditory perceptions, unspecified ear: Secondary | ICD-10-CM | POA: Insufficient documentation

## 2017-10-02 DIAGNOSIS — H93213 Auditory recruitment, bilateral: Secondary | ICD-10-CM | POA: Diagnosis not present

## 2017-10-02 NOTE — Procedures (Signed)
[facility]  [place], [place] [address]  [phone]   Audiological Evaluation  Patient Name: Joshua Hahn   Status: Outpatient   DOB: 21-Nov-1945    Diagnosis: Hearing Loss                 MRN: [id number] Date:  10/02/2017     Referent:Inda Coke, [place]    History: RACIEL CAFFREY was seen for an audiological evaluation. Primary Concern: Hearing loss in each ear, difficulty hearing conversation-especially in background noise.  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 History of hypertension: Y - controlled with medication. History of diabetes: N    Evaluation: Conventional pure tone audiometry from 250Hz  - 8000Hz  with using insert earphones.  Hearing Thresholds are symmetrical from 250Hz  - 1000Hz  ranging from 10-15 dBHL; In the high frequencies the left ear is poorer with hearing thresholds at 2000Hz  of 15 dBHL in the right ear and 35 dBHL in the left ear. At 3000Hz -4000Hz  30 dBHL on tehr ight and 60dBHL on the left. At 6000Hz  30 dBHL on the right and 50 dBHL on the left and at 8000Hz  25 dBHL on the right and 35 dBHL on the left. 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 Reliability is good Speech reception levels (repeating words near threshold) using recorded spondee word lists:  Right ear: 15 dBHL.  Left ear:  15 dBHL Word recognition (at comfortably loud volumes) using recorded NU-6 word lists at 55 dBHL (Most Comfortable Loudness Level), in quiet.  Right ear: 100%.  Left ear:   100% Word recognition using PBK word lists in recorded multitalker noise  noise:  +5 dBHL  Right ear: 58%                              Left ear:  48%  Tympanometry shows normal middle ear volume, pressure and compliance (Type A). Ipsilateral acoustic reflexes are 85-90dBHL from 500Hz  - 1000Hz  and 95 dBHL from 2000Hz  - 4000Hz .    CONCLUSION:      DRAYSON DORKO has normal hearing in the low frequencies with a high frequency hearing loss bilaterally, that is poorer on the the left side. The left side has a moderately severe high frequency hearing loss where as the right ear has a borderline mild to moderate hearing loss. 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 Consider using musician's ear plugs to protect hearing while minimizing sound distortion. These may be obtained from http://www.[employer].com/. However, molded hearing protection may be obtained from audiologists who dispense hearing aids.   Word recognition is excellent in quiet at conversational speech levels bilaterally. In minimal background noise, word recognition drops to poor in each ear. It is expected that [name]  will miss about 50% of what is said in most social settings, including when watching TV.  NASIIR MONTS is an excellent candidate for amplification; therefore a hearing aid evaluation is recommended. The test results were discussed and WEYLYN RICCIUTI counseled.  RECOMMENDATIONS: 1.  A hearing aid evaluation at the location of Aleem Elza Livecchi's choice.  As discussed a) check with insurance benefits first to identify hearing aid benefit as well as participating providers b) contact [facility] to determine hearing aid benefits. 2.  Strategies that help improve hearing  include: A) Face the speaker directly. Optimal is having the speakers face well - lit.  Unless amplified, being within 6 feet of the speaker will enhance word recognition. B) Avoid having the speaker back-lit as this will minimize the ability to use cues from lip-reading, facial expression and gestures. C)  Word recognition is poorer in background noise. For optimal word recognition, turn off the TV, radio or noisy fan when engaging in conversation. In a restaurant, try to sit away from noise sources and close to the primary speaker.  D)  Ask for topic clarification from time to time in order to remain in the conversation.  Most people don't mind repeating or clarifying a point when asked.  If needed, explain the difficulty hearing in background noise or hearing loss. Have conversation face to face and maintain eye contact. Avoid having important conversation when Chivas's back is to the speaker.  E)  Avoid "multitasking" with electronic devices during conversation (i.ePhilis Nettle without looking at phone, computer, etc). 3.   Use hearing protection during noisy activities such as using a weed eater, moving the lawn, shooting, etc.    Musician's plugs, are available from [employer].com for music related hearing protection because there is no distortion. Molded hearing protection may be obtained from audiologists which may be more discrete during loud church services.  Other hearing protection, such as sponge plugs (available at pharmacies) or earmuffs (available at sporting goods stores or department stores such as Paediatric nurse) are useful for noisy activities and venues. 4.   A computer based program to enhance listening skills, especially helpful to learn to use hearing aids, is LACE by [employer].  This is available online.     Deborah L. Heide Spark Au.D., CCC-A Doctor of Audiology  10/02/2017   cc: Marin Olp, MD

## 2017-10-04 DIAGNOSIS — N183 Chronic kidney disease, stage 3 (moderate): Secondary | ICD-10-CM | POA: Diagnosis not present

## 2017-10-04 DIAGNOSIS — E785 Hyperlipidemia, unspecified: Secondary | ICD-10-CM | POA: Diagnosis not present

## 2017-10-04 DIAGNOSIS — I25810 Atherosclerosis of coronary artery bypass graft(s) without angina pectoris: Secondary | ICD-10-CM | POA: Diagnosis not present

## 2017-10-04 DIAGNOSIS — I10 Essential (primary) hypertension: Secondary | ICD-10-CM | POA: Diagnosis not present

## 2017-10-04 DIAGNOSIS — Z79899 Other long term (current) drug therapy: Secondary | ICD-10-CM | POA: Diagnosis not present

## 2017-10-04 DIAGNOSIS — C61 Malignant neoplasm of prostate: Secondary | ICD-10-CM | POA: Diagnosis not present

## 2017-10-04 LAB — PSA: PSA: <0.015

## 2017-10-05 LAB — COMPREHENSIVE METABOLIC PANEL
A/G RATIO: 1.8 (ref 1.2–2.2)
ALT: 19 IU/L (ref 0–44)
AST: 27 IU/L (ref 0–40)
Albumin: 4.4 g/dL (ref 3.5–4.8)
Alkaline Phosphatase: 80 IU/L (ref 39–117)
BILIRUBIN TOTAL: 0.7 mg/dL (ref 0.0–1.2)
BUN/Creatinine Ratio: 8 — ABNORMAL LOW (ref 10–24)
BUN: 15 mg/dL (ref 8–27)
CHLORIDE: 103 mmol/L (ref 96–106)
CO2: 24 mmol/L (ref 20–29)
Calcium: 10.3 mg/dL — ABNORMAL HIGH (ref 8.6–10.2)
Creatinine, Ser: 1.99 mg/dL — ABNORMAL HIGH (ref 0.76–1.27)
GFR calc Af Amer: 38 mL/min/{1.73_m2} — ABNORMAL LOW (ref >59)
GFR calc non Af Amer: 33 mL/min/{1.73_m2} — ABNORMAL LOW (ref >59)
Globulin, Total: 2.5 g/dL (ref 1.5–4.5)
Glucose: 97 mg/dL (ref 65–99)
POTASSIUM: 5 mmol/L (ref 3.5–5.2)
SODIUM: 141 mmol/L (ref 134–144)
Total Protein: 6.9 g/dL (ref 6.0–8.5)

## 2017-10-05 LAB — CBC
HEMOGLOBIN: 14.1 g/dL (ref 13.0–17.7)
Hematocrit: 41.5 % (ref 37.5–51.0)
MCH: 30.1 pg (ref 26.6–33.0)
MCHC: 34 g/dL (ref 31.5–35.7)
MCV: 89 fL (ref 79–97)
PLATELETS: 185 10*3/uL (ref 150–450)
RBC: 4.69 x10E6/uL (ref 4.14–5.80)
RDW: 13.1 % (ref 12.3–15.4)
WBC: 7.7 10*3/uL (ref 3.4–10.8)

## 2017-10-05 LAB — LIPID PANEL
CHOLESTEROL TOTAL: 131 mg/dL (ref 100–199)
Chol/HDL Ratio: 3.1 ratio (ref 0.0–5.0)
HDL: 42 mg/dL (ref >39)
LDL CALC: 57 mg/dL (ref 0–99)
TRIGLYCERIDES: 160 mg/dL — AB (ref 0–149)
VLDL CHOLESTEROL CAL: 32 mg/dL (ref 5–40)

## 2017-10-05 LAB — TSH: TSH: 2.11 u[IU]/mL (ref 0.450–4.500)

## 2017-10-10 ENCOUNTER — Other Ambulatory Visit: Payer: Self-pay | Admitting: *Deleted

## 2017-10-10 ENCOUNTER — Encounter: Payer: Self-pay | Admitting: Cardiovascular Disease

## 2017-10-10 DIAGNOSIS — N289 Disorder of kidney and ureter, unspecified: Secondary | ICD-10-CM

## 2017-10-10 DIAGNOSIS — Z79899 Other long term (current) drug therapy: Secondary | ICD-10-CM

## 2017-10-11 DIAGNOSIS — H02839 Dermatochalasis of unspecified eye, unspecified eyelid: Secondary | ICD-10-CM | POA: Diagnosis not present

## 2017-10-11 DIAGNOSIS — H524 Presbyopia: Secondary | ICD-10-CM | POA: Diagnosis not present

## 2017-10-11 DIAGNOSIS — H1013 Acute atopic conjunctivitis, bilateral: Secondary | ICD-10-CM | POA: Diagnosis not present

## 2017-10-11 DIAGNOSIS — H40023 Open angle with borderline findings, high risk, bilateral: Secondary | ICD-10-CM | POA: Diagnosis not present

## 2017-10-21 ENCOUNTER — Other Ambulatory Visit: Payer: Self-pay | Admitting: Cardiovascular Disease

## 2017-10-24 ENCOUNTER — Encounter: Payer: Self-pay | Admitting: Physician Assistant

## 2017-10-24 ENCOUNTER — Ambulatory Visit (INDEPENDENT_AMBULATORY_CARE_PROVIDER_SITE_OTHER): Payer: Medicare Other | Admitting: Physician Assistant

## 2017-10-24 VITALS — BP 126/76 | HR 57 | Ht 71.0 in | Wt 218.4 lb

## 2017-10-24 DIAGNOSIS — N189 Chronic kidney disease, unspecified: Secondary | ICD-10-CM

## 2017-10-24 DIAGNOSIS — N393 Stress incontinence (female) (male): Secondary | ICD-10-CM | POA: Diagnosis not present

## 2017-10-24 DIAGNOSIS — G4733 Obstructive sleep apnea (adult) (pediatric): Secondary | ICD-10-CM | POA: Diagnosis not present

## 2017-10-24 DIAGNOSIS — C61 Malignant neoplasm of prostate: Secondary | ICD-10-CM | POA: Diagnosis not present

## 2017-10-24 DIAGNOSIS — N5201 Erectile dysfunction due to arterial insufficiency: Secondary | ICD-10-CM | POA: Diagnosis not present

## 2017-10-24 DIAGNOSIS — E785 Hyperlipidemia, unspecified: Secondary | ICD-10-CM | POA: Diagnosis not present

## 2017-10-24 DIAGNOSIS — Z951 Presence of aortocoronary bypass graft: Secondary | ICD-10-CM | POA: Diagnosis not present

## 2017-10-24 DIAGNOSIS — I10 Essential (primary) hypertension: Secondary | ICD-10-CM

## 2017-10-24 DIAGNOSIS — N261 Atrophy of kidney (terminal): Secondary | ICD-10-CM | POA: Diagnosis not present

## 2017-10-24 DIAGNOSIS — N289 Disorder of kidney and ureter, unspecified: Secondary | ICD-10-CM

## 2017-10-24 NOTE — Patient Instructions (Addendum)
Your physician recommends that you return for lab work TODAY - BMET  Your physician wants you to follow-up in: 6 months with Dr. Claiborne Billings. You will receive a reminder letter in the mail two months in advance. If you don't receive a letter, please call our office to schedule the follow-up appointment.

## 2017-10-24 NOTE — Progress Notes (Signed)
Cardiology Office Note    Date:  10/26/2017   ID:  Joshua Hahn, Mcomber 06-05-1945, MRN [id number]  PCP:  Marin Olp, MD  Cardiologist:  Dr. Claiborne Billings  Chief Complaint  Patient presents with  . Follow-up    seen for Dr. Claiborne Billings.     History of Present Illness:  DEAUNDRE Hahn is a 72 y.o. male with PMH of CAD s/p CABG, HTN, HLD, and OSA.  Patient had a PTCA to distal RCA in 1988.  He had a inferior wall MI in 1994 and the subsequently underwent CABG with free LIMA to large D1, SVG to LAD, SVG to OM1 and OM 2, SVG to RCA.  Cardiac catheterization in 2010 showed significant native CAD with total occlusion of LAD, diffuse AV groove left circumflex stenosis, occluded SVG to RCA, patent LIMA to diagonal, patent SVG to LAD, occluded SVG to obtuse marginal.  He underwent complex intervention to native left circumflex on 08/23/2008 and had placement of 2 DES.  Myoview in January 2013 was low risk, fixed basal to mid inferior scar.  He developed recurrent chest pain in October 2014, repeat cardiac catheterization revealed 95% focal in-stent restenosis of the distal third of the stent in the left circumflex vessel, he also had high-grade lesion in the SVG to mid LAD.  Left circumflex vessel was treated with PTCA, SVG to LAD was treated with PTCA and DES.  He has been using CPAP machine for his obstructive sleep apnea.  He was last seen by Dr. Claiborne Billings in December 2018, at which time he was doing well.  His Brilinta was reduced to 60 mg twice daily.  Both metoprolol and losartan were increased during the last office visit.  Patient presents to neurology office today.  He denies any recent chest pain or shortness of breath.  He has no lower extremity edema, orthopnea or PND.  Recent lab work obtained on 10/04/2017 showed well-controlled cholesterol, HDL and LDL, borderline high triglyceride.  I will continue him on the current dose of statin.  Recent lab work does show that creatinine has increased to 1.99,  losartan was decreased from 75 mg to 50 mg daily.  I will obtain a repeat basic metabolic panel today.   Past Medical History:  Diagnosis Date  . Coronary artery disease 1994   PCI'80s, CABG '94, CFX DES 4/10  . Diverticulosis   . HTN (hypertension) 10/16/2011   Echo -EF 50-55% ,LV NORMAL  . 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 levels LDL IIIa+b% and HDL2b%  . Hypertension   . Myocardial infarct ([place])   . Myocardial infarction [facility]) 1994   infer[facility] wall [facility] at [facility]  . Prostate cancer ([place])   . Sleep apnea    uses CPAP PRN  . Sleep apnea, obstructive    using C-PAP    Past Surgical History:  Procedure Laterality Date  . ABDOMINAL SURGERY    . CARDIAC CATHETERIZATION  06/23/2008   occlusion sequential OM1 andOM2 graft as well vein graft to RCA.LAD and diagonal system remain intact. RCA  collateralized  . CORONARY ANGIOPLASTY WITH STENT PLACEMENT    . CORONARY ARTERY BYPASS GRAFT  1994  . EXPLORATORY LAPAROTOMY     ideopathic retroperitoneal fibrosis  . LEFT HEART CATHETERIZATION WITH CORONARY/GRAFT ANGIOGRAM N/A 02/04/2013   Procedure: LEFT HEART CATHETERIZATION WITH Beatrix Fetters;  Surgeon: [name], MD;  Location: [facility] CATH LAB;  Service:  Cardiovascular;  Laterality: N/A;  . LEFT PAROTID GLAND SURGRY  60/1093   Dr. Erik Obey  . PERCUTANEOUS CORONARY STENT INTERVENTION (PCI-S)  06/25/2008   rotational atherectomy w/diffuse disease native circ. w/drug eluting stents , PTCA on one vessel; grat occlusion to RCA w/native RCA proxim. occlusion. Circ.supplied collaaterals distal RCA  following intervention  . PERCUTANEOUS CORONARY STENT INTERVENTION (PCI-S) N/A 02/05/2013   Procedure: PERCUTANEOUS CORONARY STENT INTERVENTION (PCI-S);  Surgeon: [name], MD;  Location: [facility] CATH LAB;  Service: Cardiovascular;  Laterality: N/A;  . PROSTATECTOMY  06/2009   robotic-asssisted  laparoscopic radical by Dr Rosana Hoes    Current Medications: Outpatient Medications Prior to Visit  Medication Sig Dispense Refill  . aspirin 81 MG tablet Take 81 mg by mouth daily.    . cholecalciferol (VITAMIN D) 1000 UNITS tablet Take 1,000 Units by mouth daily.    . Coenzyme Q10 (CO Q 10) 100 MG CAPS Take 300 mg by mouth every evening.     . isosorbide mononitrate (IMDUR) 60 MG 24 hr tablet Take 1 tablet (60 mg total) by mouth daily. 30 tablet 2  . isosorbide mononitrate (IMDUR) 60 MG 24 hr tablet TAKE 1 TABLET BY MOUTH EVERY DAY 30 tablet 5  . losartan (COZAAR) 50 MG tablet Take 1 tablet (50 mg total) by mouth daily. PLEASE CONTACT OFFICE FOR ADDITIONAL REFILLS 30 tablet 0  . metoprolol tartrate (LOPRESSOR) 25 MG tablet Take 1 tablet (25 mg total) by mouth every morning AND 0.5 tablets (12.5 mg total) every evening. 135 tablet 3  . nitroGLYCERIN (NITROSTAT) 0.4 MG SL tablet Place 1 tablet (0.4 mg total) under the tongue every 5 (five) minutes as needed for chest pain. 75 tablet 3  . omega-3 acid ethyl esters (LOVAZA) 1 g capsule TAKE 2 CAPSULES (2 G TOTAL) BY MOUTH 2 (TWO) TIMES DAILY. 120 capsule 5  . RANEXA 500 MG 12 hr tablet TAKE 1 TABLET (500 MG TOTAL) BY MOUTH 2 (TWO) TIMES DAILY. 180 tablet 2  . rosuvastatin (CRESTOR) 40 MG tablet Take 1 tablet (40 mg total) by mouth daily. 30 tablet 11  . rosuvastatin (CRESTOR) 40 MG tablet TAKE 1 TABLET BY MOUTH DAILY 30 tablet 7  . ticagrelor (BRILINTA) 60 MG TABS tablet Take 1 tablet (60 mg total) by mouth 2 (two) times daily. 180 tablet 3  . vitamin B-12 (CYANOCOBALAMIN) 100 MCG tablet Take 100 mcg by mouth daily.      No facility-administered medications prior to visit.      Allergies:   Patient has no known allergies.   Social History   Socioeconomic History  . Marital status: Married    Spouse name: Not on file  . Number of children: 2  . Years of education: Not on file  . Highest education level: Not on file  Occupational History    . Occupation: Theme park manager  Social Needs  . Financial resource strain: Not on file  . Food insecurity:    Worry: Not on file    Inability: Not on file  . Transportation needs:    Medical: Not on file    Non-medical: Not on file  Tobacco Use  . Smoking status: Former Smoker    Types: Cigarettes    Last attempt to quit: 05/07/1973    Years since quitting: 44.5  . Smokeless tobacco: Never Used  Substance and Sexual Activity  . Alcohol use: No  . Drug use: No  . Sexual activity: Not on file  Lifestyle  . Physical activity:  Days per week: Not on file    Minutes per session: Not on file  . Stress: Not on file  Relationships  . Social connections:    Talks on phone: Not on file    Gets together: Not on file    Attends religious service: Not on file    Active member of club or organization: Not on file    Attends meetings of clubs or organizations: Not on file    Relationship status: Not on file  Other Topics Concern  . Not on file  Social History Narrative   ** Merged History Encounter **       Married 48 years in 2016. 2 children. 5 grand kids.   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   Hobbies: speaking events, traveling     Family History:  The patient's family history includes Coronary artery disease (age of onset: 60) in his brother; Heart disease in his brother and father; Hypertension in his father and mother; Pancreatic cancer in his father; Stroke in his mother; Throat cancer in his brother.   ROS:   Please see the history of present illness.    ROS All other systems reviewed and are negative.   PHYSICAL EXAM:   VS:  BP 126/76   Pulse (!) 57   Ht 5\' 11"  (1.803 m)   Wt 218 lb 6.4 oz (99.1 kg)   BMI 30.46 kg/m    GEN: Well nourished, well developed, in no acute distress  HEENT: normal  Neck: no JVD, carotid bruits, or masses Cardiac: RRR; no murmurs, rubs, or gallops,no edema  Respiratory:  clear to auscultation bilaterally, normal work of breathing GI:  soft, nontender, nondistended, + BS MS: no deformity or atrophy  Skin: warm and dry, no rash Neuro:  Alert and Oriented x 3, Strength and sensation are intact Psych: euthymic mood, full affect  Wt Readings from Last 3 Encounters:  10/24/17 218 lb 6.4 oz (99.1 kg)  07/10/17 223 lb 8 oz (101.4 kg)  04/23/17 221 lb (100.2 kg)      Studies/Labs Reviewed:   EKG:  EKG is ordered today.  The ekg ordered today demonstrates sinus brady with 1st degree AV block, otherwise no significant ST-T wave changes  Recent Labs: 10/04/2017: ALT 19; Hemoglobin 14.1; Platelets 185; TSH 2.110 10/24/2017: BUN 15; Creatinine, Ser 1.97; Potassium 4.8; Sodium 139   Lipid Panel    Component Value Date/Time   CHOL 131 10/04/2017 1106   CHOL 137 11/22/2014 1117   TRIG 160 (H) 10/04/2017 1106   TRIG 160 (H) 11/22/2014 1117   TRIG 216 (HH) 03/04/2006 0946   HDL 42 10/04/2017 1106   HDL 43 11/22/2014 1117   CHOLHDL 3.1 10/04/2017 1106   CHOLHDL 3.4 08/17/2015 1158   VLDL 32 (H) 08/17/2015 1158   LDLCALC 57 10/04/2017 1106   LDLCALC 62 11/22/2014 1117   LDLDIRECT 135.5 02/27/2012 1718    Additional studies/ records that were reviewed today include:   Echo 10/16/2011    ASSESSMENT:    1. Hx of CABG   2. Acute on chronic renal insufficiency   3. Essential hypertension   4. Hyperlipidemia, unspecified hyperlipidemia type   5. OSA (obstructive sleep apnea)      PLAN:  In order of problems listed above:  1. CAD s/p CABG: Continue aspirin and Crestor.  Denies any recent chest pain.  2. Acute on chronic renal insufficiency: Recent lab work showed elevated creatinine.  3. Hypertension: Blood pressure stable on  current medication, losartan was recently decreased due to elevated creatinine  4. Hyperlipidemia: On Crestor.  Recent cholesterol obtained on 10/04/2017 showed borderline elevated triglyceride, well controlled HDL and LDL.  5. Obstructive sleep apnea: He has not used CPAP machine in the  past 2 weeks.  He will restart CPAP machine now.    Medication Adjustments/Labs and Tests Ordered: Current medicines are reviewed at length with the patient today.  Concerns regarding medicines are outlined above.  Medication changes, Labs and Tests ordered today are listed in the Patient Instructions below. Patient Instructions  Your physician recommends that you return for lab work TODAY - BMET  Your physician wants you to follow-up in: 6 months with Dr. Claiborne Billings. You will receive a reminder letter in the mail two months in advance. If you don't receive a letter, please call our office to schedule the follow-up appointment.     Hilbert Corrigan, [place]  10/26/2017 11:35 PM    [facility] [place], [place], [place]  [address] Phone: [phone]; Fax: [phone]

## 2017-10-25 ENCOUNTER — Encounter: Payer: Self-pay | Admitting: Family Medicine

## 2017-10-25 LAB — BASIC METABOLIC PANEL
BUN/Creatinine Ratio: 8 — ABNORMAL LOW (ref 10–24)
BUN: 15 mg/dL (ref 8–27)
CALCIUM: 10.1 mg/dL (ref 8.6–10.2)
CHLORIDE: 101 mmol/L (ref 96–106)
CO2: 26 mmol/L (ref 20–29)
Creatinine, Ser: 1.97 mg/dL — ABNORMAL HIGH (ref 0.76–1.27)
GFR, EST AFRICAN AMERICAN: 38 mL/min/{1.73_m2} — AB (ref >59)
GFR, EST NON AFRICAN AMERICAN: 33 mL/min/{1.73_m2} — AB (ref >59)
Glucose: 101 mg/dL — ABNORMAL HIGH (ref 65–99)
Potassium: 4.8 mmol/L (ref 3.5–5.2)
Sodium: 139 mmol/L (ref 134–144)

## 2017-10-26 ENCOUNTER — Encounter: Payer: Self-pay | Admitting: Physician Assistant

## 2017-10-29 ENCOUNTER — Other Ambulatory Visit: Payer: Self-pay

## 2017-10-29 ENCOUNTER — Encounter: Payer: Self-pay | Admitting: Physician Assistant

## 2017-10-29 DIAGNOSIS — N184 Chronic kidney disease, stage 4 (severe): Secondary | ICD-10-CM

## 2017-11-04 ENCOUNTER — Other Ambulatory Visit: Payer: Self-pay | Admitting: Cardiovascular Disease

## 2017-11-04 DIAGNOSIS — L821 Other seborrheic keratosis: Secondary | ICD-10-CM | POA: Diagnosis not present

## 2017-11-04 DIAGNOSIS — L813 Cafe au lait spots: Secondary | ICD-10-CM | POA: Diagnosis not present

## 2017-11-07 ENCOUNTER — Other Ambulatory Visit: Payer: Self-pay | Admitting: Cardiovascular Disease

## 2017-11-19 ENCOUNTER — Other Ambulatory Visit: Payer: Self-pay | Admitting: Cardiovascular Disease

## 2017-11-19 NOTE — Telephone Encounter (Signed)
Rx sent to pharmacy   

## 2017-12-10 ENCOUNTER — Other Ambulatory Visit: Payer: Self-pay | Admitting: Cardiovascular Disease

## 2017-12-10 NOTE — Telephone Encounter (Signed)
Rx sent to pharmacy   

## 2017-12-27 DIAGNOSIS — H02839 Dermatochalasis of unspecified eye, unspecified eyelid: Secondary | ICD-10-CM | POA: Diagnosis not present

## 2017-12-27 DIAGNOSIS — H524 Presbyopia: Secondary | ICD-10-CM | POA: Diagnosis not present

## 2017-12-27 DIAGNOSIS — H1013 Acute atopic conjunctivitis, bilateral: Secondary | ICD-10-CM | POA: Diagnosis not present

## 2017-12-27 DIAGNOSIS — H40023 Open angle with borderline findings, high risk, bilateral: Secondary | ICD-10-CM | POA: Diagnosis not present

## 2018-01-01 ENCOUNTER — Ambulatory Visit (INDEPENDENT_AMBULATORY_CARE_PROVIDER_SITE_OTHER): Payer: Medicare Other

## 2018-01-01 VITALS — BP 124/78 | HR 57 | Ht 71.0 in | Wt 215.0 lb

## 2018-01-01 DIAGNOSIS — Z0000 Encounter for general adult medical examination without abnormal findings: Secondary | ICD-10-CM | POA: Diagnosis not present

## 2018-01-01 NOTE — Patient Instructions (Signed)
[name] , Thank you for taking time to come for your Medicare Wellness Visit. I appreciate your ongoing commitment to your health goals. Please review the following plan we discussed and let me know if I can assist you in the future.   These are the goals we discussed: Goals    . Increase physical activity     Maintain home gym exercise program, looking to join local [employer] for social interaction also       This is a list of the screening recommended for you and due dates:  Health Maintenance  Topic Date Due  .  Hepatitis C: One time screening is recommended by Center for Disease Control  (CDC) for  adults born from [birth year] through 1966.   1946-04-29  . Flu Shot  12/05/2017  . Colon Cancer Screening  12/03/2019  . Tetanus Vaccine  10/07/2024  . Pneumonia vaccines  Completed   Preventive Care for Adults  A healthy lifestyle and preventive care can promote health and wellness. Preventive health guidelines for adults include the following key practices.  . A routine yearly physical is a good way to check with your health care provider about your health and preventive screening. It is a chance to share any concerns and updates on your health and to receive a thorough exam.  . Visit your dentist for a routine exam and preventive care every 6 months. Brush your teeth twice a day and floss once a day. Good oral hygiene prevents tooth decay and gum disease.  . The frequency of eye exams is based on your age, health, family medical history, use  of contact lenses, and other factors. Follow your health care provider's recommendations for frequency of eye exams.  . Eat a healthy diet. Foods like vegetables, fruits, whole grains, low-fat dairy products, and lean protein foods contain the nutrients you need without too many calories. Decrease your intake of foods high in solid fats, added sugars, and salt. Eat the right amount of calories for you. Get information about a proper diet from your health  care provider, if necessary.  . Regular physical exercise is one of the most important things you can do for your health. Most adults should get at least 150 minutes of moderate-intensity exercise (any activity that increases your heart rate and causes you to sweat) each week. In addition, most adults need muscle-strengthening exercises on 2 or more days a week.  Silver Sneakers may be a benefit available to you. To determine eligibility, you may visit the website: www.silversneakers.com or contact program at 902-589-8621 Mon-Fri between 8AM-8PM.   . Maintain a healthy weight. The body mass index (BMI) is a screening tool to identify possible weight problems. It provides an estimate of body fat based on height and weight. Your health care provider can find your BMI and can help you achieve or maintain a healthy weight.   For adults 20 years and older: ? A BMI below 18.5 is considered underweight. ? A BMI of 18.5 to 24.9 is normal. ? A BMI of 25 to 29.9 is considered overweight. ? A BMI of 30 and above is considered obese.   . Maintain normal blood lipids and cholesterol levels by exercising and minimizing your intake of saturated fat. Eat a balanced diet with plenty of fruit and vegetables. Blood tests for lipids and cholesterol should begin at age 60 and be repeated every 5 years. If your lipid or cholesterol levels are high, you are over 50, or you are  at high risk for heart disease, you may need your cholesterol levels checked more frequently. Ongoing high lipid and cholesterol levels should be treated with medicines if diet and exercise are not working.  . If you smoke, find out from your health care provider how to quit. If you do not use tobacco, please do not start.  . If you choose to drink alcohol, please do not consume more than 2 drinks per day. One drink is considered to be 12 ounces (355 mL) of beer, 5 ounces (148 mL) of wine, or 1.5 ounces (44 mL) of liquor.  . If you are 45-5  years old, ask your health care provider if you should take aspirin to prevent strokes.  . Use sunscreen. Apply sunscreen liberally and repeatedly throughout the day. You should seek shade when your shadow is shorter than you. Protect yourself by wearing long sleeves, pants, a wide-brimmed hat, and sunglasses year round, whenever you are outdoors.  . Once a month, do a whole body skin exam, using a mirror to look at the skin on your back. Tell your health care provider of new moles, moles that have irregular borders, moles that are larger than a pencil eraser, or moles that have changed in shape or color.

## 2018-01-01 NOTE — Progress Notes (Signed)
PCP notes: Last OV with PCP 06/07/16   Health maintenance: Schedule Flu Shot for this Fall   Abnormal screenings: Hearing Screening recommendation of referral. Discussed going to the [place] for further testing and possible hearing aids   Patient concerns: draw B12 level at next visit, red pimples sporadically on body, scheduled with Dr. Yong Channel on 01/07/18   Nurse concerns:None   Next PCP appt: 01/07/18

## 2018-01-01 NOTE — Progress Notes (Signed)
I have reviewed and agree with note, evaluation, plan. Look forward to visit on the 3rd to address concerns.   Garret Reddish, MD
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
## 2018-01-07 ENCOUNTER — Ambulatory Visit: Payer: Medicare Other | Admitting: Family Medicine

## 2018-01-09 ENCOUNTER — Ambulatory Visit (INDEPENDENT_AMBULATORY_CARE_PROVIDER_SITE_OTHER): Payer: Medicare Other | Admitting: Family Medicine

## 2018-01-09 ENCOUNTER — Encounter: Payer: Self-pay | Admitting: Family Medicine

## 2018-01-09 VITALS — BP 128/76 | HR 54 | Temp 97.8°F | Ht 71.0 in | Wt 216.0 lb

## 2018-01-09 DIAGNOSIS — N183 Chronic kidney disease, stage 3 unspecified: Secondary | ICD-10-CM

## 2018-01-09 DIAGNOSIS — Z1159 Encounter for screening for other viral diseases: Secondary | ICD-10-CM

## 2018-01-09 DIAGNOSIS — E785 Hyperlipidemia, unspecified: Secondary | ICD-10-CM

## 2018-01-09 DIAGNOSIS — I10 Essential (primary) hypertension: Secondary | ICD-10-CM | POA: Diagnosis not present

## 2018-01-09 DIAGNOSIS — I25118 Atherosclerotic heart disease of native coronary artery with other forms of angina pectoris: Secondary | ICD-10-CM | POA: Diagnosis not present

## 2018-01-09 DIAGNOSIS — E559 Vitamin D deficiency, unspecified: Secondary | ICD-10-CM

## 2018-01-09 DIAGNOSIS — E538 Deficiency of other specified B group vitamins: Secondary | ICD-10-CM

## 2018-01-09 DIAGNOSIS — C61 Malignant neoplasm of prostate: Secondary | ICD-10-CM

## 2018-01-09 LAB — CBC
HCT: 42.4 % (ref 39.0–52.0)
Hemoglobin: 14.2 g/dL (ref 13.0–17.0)
MCHC: 33.5 g/dL (ref 30.0–36.0)
MCV: 89.8 fl (ref 78.0–100.0)
PLATELETS: 183 10*3/uL (ref 150.0–400.0)
RBC: 4.72 Mil/uL (ref 4.22–5.81)
RDW: 13.2 % (ref 11.5–15.5)
WBC: 6.5 10*3/uL (ref 4.0–10.5)

## 2018-01-09 LAB — COMPREHENSIVE METABOLIC PANEL
ALBUMIN: 4.1 g/dL (ref 3.5–5.2)
ALK PHOS: 61 U/L (ref 39–117)
ALT: 18 U/L (ref 0–53)
AST: 22 U/L (ref 0–37)
BILIRUBIN TOTAL: 0.7 mg/dL (ref 0.2–1.2)
BUN: 13 mg/dL (ref 6–23)
CALCIUM: 9.9 mg/dL (ref 8.4–10.5)
CO2: 25 meq/L (ref 19–32)
CREATININE: 1.67 mg/dL — AB (ref 0.40–1.50)
Chloride: 104 mEq/L (ref 96–112)
GFR: 52.1 mL/min — ABNORMAL LOW (ref >60.00)
Glucose, Bld: 91 mg/dL (ref 70–99)
Potassium: 4.5 mEq/L (ref 3.5–5.1)
Sodium: 138 mEq/L (ref 135–145)
TOTAL PROTEIN: 7.2 g/dL (ref 6.0–8.3)

## 2018-01-09 LAB — VITAMIN B12

## 2018-01-09 LAB — VITAMIN D 25 HYDROXY (VIT D DEFICIENCY, FRACTURES): VITD: 41.84 ng/mL (ref 30.00–100.00)

## 2018-01-09 LAB — LDL CHOLESTEROL, DIRECT: LDL DIRECT: 66 mg/dL

## 2018-01-09 NOTE — Assessment & Plan Note (Signed)
Continues regular urology follow up and reports no recurrence

## 2018-01-09 NOTE — Assessment & Plan Note (Signed)
S: well controlled on rosuvastaitn 40mg  with LDL 66 today, too early for full lipid repeat A/P: continue current rx

## 2018-01-09 NOTE — Progress Notes (Signed)
Subjective:  Joshua Hahn is a 72 y.o. year old very pleasant male patient who presents for/with See problem oriented charting ROS- No chest pain or shortness of breath. No headache or blurry vision.  Some stress with moving back into home and trying to help his organizations efforts in [place].   Past Medical History-  Patient Active Problem List   Diagnosis Date Noted  . CAD - PCI 1980s, CABG X 4 1994, DES CFX 08/2008, stent 2014 nstemi 01/11/2013    Priority: High  . ADENOCARCINOMA, PROSTATE, GLEASON GRADE 7 04/05/2009    Priority: High  . Anosmia 09/28/2014    Priority: Medium  . Chronic renal insufficiency, stage III (moderate) (HCC) 02/05/2013    Priority: Medium  . OSA on CPAP 01/11/2013    Priority: Medium  . Hyperlipidemia 12/18/2006    Priority: Medium  . Essential hypertension 12/18/2006    Priority: Medium  . Old MI (myocardial infarction) 01/11/2013    Priority: Low  . History of adenomatous polyp of colon 12/10/2014    Medications- reviewed and updated Current Outpatient Medications  Medication Sig Dispense Refill  . aspirin 81 MG tablet Take 81 mg by mouth daily.    Marland Kitchen BRILINTA 60 MG TABS tablet TAKE 1 TABLET (60 MG TOTAL) BY MOUTH 2 (TWO) TIMES DAILY. 60 tablet 6  . cholecalciferol (VITAMIN D) 1000 UNITS tablet Take 1,000 Units by mouth daily.    . Coenzyme Q10 (CO Q 10) 100 MG CAPS Take 300 mg by mouth every evening.     . isosorbide mononitrate (IMDUR) 60 MG 24 hr tablet Take 1 tablet (60 mg total) by mouth daily. 30 tablet 2  . losartan (COZAAR) 50 MG tablet Take 1 tablet (50 mg total) by mouth daily. 30 tablet 3  . metoprolol tartrate (LOPRESSOR) 25 MG tablet Take 1 tablet (25 mg total) by mouth every morning AND 0.5 tablets (12.5 mg total) every evening. 135 tablet 3  . nitroGLYCERIN (NITROSTAT) 0.4 MG SL tablet Place 1 tablet (0.4 mg total) under the tongue every 5 (five) minutes as needed for chest pain. 75 tablet 3  . omega-3 acid ethyl esters (LOVAZA) 1  g capsule TAKE 2 CAPSULES (2 G TOTAL) BY MOUTH 2 (TWO) TIMES DAILY. 120 capsule 5  . RANEXA 500 MG 12 hr tablet TAKE 1 TABLET BY MOUTH TWICE A DAY 180 tablet 1  . rosuvastatin (CRESTOR) 40 MG tablet Take 1 tablet (40 mg total) by mouth daily. 30 tablet 11  . vitamin B-12 (CYANOCOBALAMIN) 100 MCG tablet Take 100 mcg by mouth daily.      Objective: BP 128/76 (BP Location: Left Arm, Patient Position: Sitting, Cuff Size: Large)   Pulse (!) 54   Temp 97.8 F (36.6 C) (Oral)   Ht 5\' 11"  (1.803 m)   Wt 216 lb (98 kg)   SpO2 97%   BMI 30.13 kg/m  Gen: NAD, resting comfortably Tm normal, oropharynx normal CV: slightly bradycardic but regular, no murmurs rubs or gallops Lungs: CTAB no crackles, wheeze, rhonchi Abdomen: soft/nontender/nondistended/normal bowel sounds. Ext: no edema Skin: warm, dry Neuro: grossly normal, moves all extremities  Assessment/Plan:  Other notes: [facility] 2. Cherry angiomas on chest and back- been there for years but has just noticed a few more 3. Some seasonal allergies- see avs comments  ADENOCARCINOMA, PROSTATE, GLEASON GRADE 7 Continues regular urology follow up and reports no recurrence  CAD - PCI 1980s, CABG X 4 1994, DES CFX 08/2008,  stent 2014 nstemi S: follows with Dr. Claiborne Billings. On ASA and brilinta, crestor. Imdur, ranexa for angina treatment.  A/P: he is doing really well- actually only half tablet twice a day ranexa and is chest pain free- continue current regimen    Hyperlipidemia S: well controlled on rosuvastaitn 40mg  with LDL 66 today, too early for full lipid repeat A/P: continue current rx    Essential hypertension S: controlled on imdur 60mg , losartan 50mg , metoprolol 25mg  BID BP Readings from Last 3 Encounters:  01/09/18 128/76  01/01/18 124/78  10/24/17 126/76  A/P: blood pressure goal at least of <140/90. Continue current meds- doing very well    Chronic renal insufficiency, stage III (moderate) S:  patient on ace-I in case proteinuric element (losartan 50mg ). Bp controlled as are lipids.  A/P: GFR actually stable to slightly improved today- contiue to monitor. Should avoid nsaids.    Future Appointments  Date Time Provider [place]  02/10/2019 10:00 AM [facility]   No follow-ups on file.  Lab/Order associations: Low serum vitamin B12 - Plan: Vitamin B12  Vitamin D deficiency - Plan: VITAMIN D 25 Hydroxy (Vit-D Deficiency, Fractures)  Hyperlipidemia, unspecified hyperlipidemia type - Plan: CBC, Comprehensive metabolic panel, LDL cholesterol, direct  Encounter for hepatitis C screening test for low risk patient - Plan: Hepatitis C antibody  ADENOCARCINOMA, PROSTATE, GLEASON GRADE 7  Coronary artery disease involving native coronary artery of native heart with other form of angina pectoris (HCC)  Essential hypertension  Chronic renal insufficiency, stage III (moderate) (HCC)  Return precautions advised.  Garret Reddish, MD

## 2018-01-09 NOTE — Assessment & Plan Note (Signed)
S: follows with Dr. Claiborne Billings. On ASA and brilinta, crestor. Imdur, ranexa for angina treatment.  A/P: he is doing really well- actually only half tablet twice a day ranexa and is chest pain free- continue current regimen

## 2018-01-09 NOTE — Assessment & Plan Note (Signed)
S: patient on ace-I in case proteinuric element (losartan 50mg ). Bp controlled as are lipids.  A/P: GFR actually stable to slightly improved today- contiue to monitor. Should avoid nsaids.

## 2018-01-09 NOTE — Assessment & Plan Note (Signed)
S: controlled on imdur 60mg , losartan 50mg , metoprolol 25mg  BID BP Readings from Last 3 Encounters:  01/09/18 128/76  01/01/18 124/78  10/24/17 126/76  A/P: blood pressure goal at least of <140/90. Continue current meds- doing very well

## 2018-01-09 NOTE — Patient Instructions (Addendum)
Health Maintenance Due  Topic Date Due  . Hepatitis C Screening - today with labs 03-Apr-1946  . INFLUENZA VACCINE -schedule for this Fall- October or november 12/05/2017   Please stop by lab before you go  Could try flonase during worst part of allergies. Low systemic risks and might be helpful for you

## 2018-01-10 LAB — HEPATITIS C ANTIBODY
HEP C AB: NONREACTIVE
SIGNAL TO CUT-OFF: 0.03 (ref <1.00)

## 2018-02-06 ENCOUNTER — Other Ambulatory Visit: Payer: Self-pay | Admitting: Cardiovascular Disease

## 2018-02-10 ENCOUNTER — Other Ambulatory Visit: Payer: Self-pay | Admitting: *Deleted

## 2018-02-13 ENCOUNTER — Other Ambulatory Visit: Payer: Self-pay | Admitting: Cardiovascular Disease

## 2018-02-17 DIAGNOSIS — I2510 Atherosclerotic heart disease of native coronary artery without angina pectoris: Secondary | ICD-10-CM | POA: Diagnosis not present

## 2018-02-17 DIAGNOSIS — C61 Malignant neoplasm of prostate: Secondary | ICD-10-CM | POA: Diagnosis not present

## 2018-02-17 DIAGNOSIS — N183 Chronic kidney disease, stage 3 (moderate): Secondary | ICD-10-CM | POA: Diagnosis not present

## 2018-02-17 DIAGNOSIS — I129 Hypertensive chronic kidney disease with stage 1 through stage 4 chronic kidney disease, or unspecified chronic kidney disease: Secondary | ICD-10-CM | POA: Diagnosis not present

## 2018-03-15 ENCOUNTER — Other Ambulatory Visit: Payer: Self-pay | Admitting: Cardiovascular Disease

## 2018-03-25 DIAGNOSIS — H1013 Acute atopic conjunctivitis, bilateral: Secondary | ICD-10-CM | POA: Diagnosis not present

## 2018-03-25 DIAGNOSIS — H40023 Open angle with borderline findings, high risk, bilateral: Secondary | ICD-10-CM | POA: Diagnosis not present

## 2018-07-18 ENCOUNTER — Other Ambulatory Visit: Payer: Self-pay

## 2018-07-18 ENCOUNTER — Other Ambulatory Visit: Payer: Self-pay | Admitting: Cardiovascular Disease

## 2018-08-07 ENCOUNTER — Other Ambulatory Visit: Payer: Self-pay | Admitting: Cardiovascular Disease

## 2018-08-07 NOTE — Telephone Encounter (Signed)
Refilled Lovaza

## 2018-08-19 ENCOUNTER — Other Ambulatory Visit: Payer: Self-pay | Admitting: Cardiovascular Disease

## 2018-08-19 NOTE — Telephone Encounter (Signed)
Isosorbide refilled. 

## 2018-08-21 DIAGNOSIS — L821 Other seborrheic keratosis: Secondary | ICD-10-CM | POA: Diagnosis not present

## 2018-08-21 DIAGNOSIS — D225 Melanocytic nevi of trunk: Secondary | ICD-10-CM | POA: Diagnosis not present

## 2018-08-21 DIAGNOSIS — S20462A Insect bite (nonvenomous) of left back wall of thorax, initial encounter: Secondary | ICD-10-CM | POA: Diagnosis not present

## 2018-09-19 ENCOUNTER — Telehealth: Payer: Self-pay

## 2018-09-19 NOTE — Telephone Encounter (Signed)
Called pt and LVM to call back to move appt up to 09/24/18 with Jory Sims, DNP if pt would like to have telephone or video visit sooner than June.

## 2018-09-20 ENCOUNTER — Other Ambulatory Visit: Payer: Self-pay | Admitting: Cardiovascular Disease

## 2018-10-24 ENCOUNTER — Other Ambulatory Visit: Payer: Self-pay | Admitting: Cardiovascular Disease

## 2018-10-24 ENCOUNTER — Telehealth: Payer: Self-pay | Admitting: Physician Assistant

## 2018-10-24 NOTE — Telephone Encounter (Signed)
Mychart, smartphone, consent, pre reg complete 10/24/18 AF

## 2018-10-27 ENCOUNTER — Telehealth: Payer: Medicare Other | Admitting: Cardiovascular Disease

## 2018-10-29 NOTE — Progress Notes (Signed)
Virtual Visit via Video Note   This visit type was conducted due to national recommendations for restrictions regarding the COVID-19 Pandemic (e.g. social distancing) in an effort to limit this patient's exposure and mitigate transmission in our community.  Due to his co-morbid illnesses, this patient is at least at moderate risk for complications without adequate follow up.  This format is felt to be most appropriate for this patient at this time.  All issues noted in this document were discussed and addressed.  A limited physical exam was performed with this format.  Please refer to the patient's chart for his consent to telehealth for [facility].   Date:  10/30/2018   ID:  Joshua Hahn, Joshua Hahn [birth year], MRN [id number]  Patient Location: Home Provider Location: Office  PCP:  Marin Olp, MD  Cardiologist:  Shelva Majestic, MD  Electrophysiologist:  None   Evaluation Performed:  Follow-Up Visit  Chief Complaint:  CAD, OSA  History of Present Illness:    Joshua DIMAANO is a 73 y.o. male with CAD s/p PCI 1980s, CABG x 4 (1994: free LIMA to large D1, SVG to LAD, SVG to OM1 and OM 2, SVG to RCA), heart cath 2010 with patent LIMA-diagonal, patent SVG-LAD, occluded SVG to OM and he underwent complex intervention with DES x2 to Cx; myoview 2013 low risk with scar; repeat heart cath due to recurrent chest pain in 2014 with 95% focal in-stent restenosis of the distal third of the stent in the Cx and high grade lesion in the SVG-midLAD, Cx treated wit PTCA, SVG to LAD treated with PTCA and DES. He also has a history of ypertension, hyperlipidemia, CKD stage III, OSA on CPAP, and prostate cancer.  He saw Dr. Claiborne Billings 04/2017 in follow up and his brilinta was reduced to 60 mg BID. He was last seen in clinic by Almyra Deforest [facility] 10/24/17 and was doing well at that time. His lipid profile looked god except borderline triglycerides. sCr had increased to 1.99 and his losartan was decreased from 75 mg to 50  mg.  He presents today for annual follow up. He remains on ASA and brilinta 60 mg BID, valsartan, crestor, imdur, and ranexa. He has three main complaints.   1. Brilinta is now too expensive and he's in the donut hole. I will reach out to Dr. Claiborne Billings about switching him to plavix - no load.   2. He is interested in upgrading his CPAP to a new, quieter machine. He questions what he needs to do - contact the company or go through Dr. Claiborne Billings.  3. Pain in right shoulder, better in the morning. Will refer to ortho. This sounds possibly consistent with arthritic changes. Will order ambulatory referral to orthopedics.   He has a home gym and mows a 7 acre yard with a zero turn. He weed-eats and pulls weeds by hand.  He stays active and has no anginal complaints. No bleeding problems on DAPT.    The patient does not have symptoms concerning for COVID-19 infection (fever, chills, cough, or new shortness of breath).    Past Medical History:  Diagnosis Date  . Chronic kidney disease (CKD), stage III (moderate) (HCC)   . Coronary artery disease 1994   PCI'80s, CABG '94, CFX DES 4/10  . Diverticulosis   . HTN (hypertension) 10/16/2011   Echo -EF 50-55% ,LV NORMAL  . 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 levels LDL IIIa+b% and HDL2b%  .  Hypertension   . Myocardial infarct ([place])   . Myocardial infarction [facility]) 1994   inferior wall MI at [facility]  . Prostate cancer ([place])   . Sleep apnea    uses CPAP PRN  . Sleep apnea, obstructive    using C-PAP   Past Surgical History:  Procedure Laterality Date  . ABDOMINAL SURGERY    . CARDIAC CATHETERIZATION  06/23/2008   occlusion sequential OM1 andOM2 graft as well vein graft to RCA.LAD and diagonal system remain intact. RCA  collateralized  . CORONARY ANGIOPLASTY WITH STENT PLACEMENT    . CORONARY ARTERY BYPASS GRAFT  1994  . EXPLORATORY LAPAROTOMY      ideopathic retroperitoneal fibrosis  . LEFT HEART CATHETERIZATION WITH CORONARY/GRAFT ANGIOGRAM N/A 02/04/2013   Procedure: LEFT HEART CATHETERIZATION WITH Beatrix Fetters;  Surgeon: [name], MD;  Location: [facility] C[facility] LAB;  Service: Cardiovascular;  Laterality: N/A;  . LEFT PAROTID GLAND SURGRY  94/1740   Dr. Erik Obey  . PERCUTANEOUS CORONARY STENT INTERVENTION (PCI-S)  06/25/2008   rotational atherectomy w/diffuse disease native circ. w/drug eluting stents , PTCA on one vessel; grat occlusion to RCA w/native RCA proxim. occlusion. Circ.supplied collaaterals distal RCA  following intervention  . PERCUTANEOUS CORONARY STENT INTERVENTION (PCI-S) N/A 02/05/2013   Procedure: PERCUTANEOUS CORONARY STENT INTERVENTION (PCI-S);  Surgeon: [name], MD;  Location: [facility] CATH LAB;  Service: Cardiovascular;  Laterality: N/A;  . PROSTATECTOMY  06/2009   robotic-asssisted laparoscopic radical by Dr Rosana Hoes     Current Meds  Medication Sig  . aspirin 81 MG tablet Take 81 mg by mouth daily.  . cholecalciferol (VITAMIN D) 1000 UNITS tablet Take 1,000 Units by mouth daily.  . Coenzyme Q10 (CO Q 10) 100 MG CAPS Take 300 mg by mouth every evening.   . isosorbide mononitrate (IMDUR) 60 MG 24 hr tablet TAKE 1 TABLET BY MOUTH EVERY DAY  . metoprolol tartrate (LOPRESSOR) 25 MG tablet TAKE 1 TABLET (25 MG TOTAL) BY MOUTH EVERY MORNING AND 0.5 TABLETS (12.5 MG TOTAL) EVERY EVENING.  . nitroGLYCERIN (NITROSTAT) 0.4 MG SL tablet Place 1 tablet (0.4 mg total) under the tongue every 5 (five) minutes as needed for chest pain.  Marland Kitchen omega-3 acid ethyl esters (LOVAZA) 1 g capsule TAKE 2 CAPSULES (2 G TOTAL) BY MOUTH 2 (TWO) TIMES DAILY.  Marland Kitchen RANEXA 500 MG 12 hr tablet TAKE 1 TABLET BY MOUTH TWICE A DAY  . rosuvastatin (CRESTOR) 40 MG tablet Take 1 tablet (40 mg total) by mouth daily. Keep June Appointment  . valsartan (DIOVAN) 40 MG tablet Take 1 tablet (40 mg total) by mouth daily. To replace losartan 50mg   . vitamin  B-12 (CYANOCOBALAMIN) 100 MCG tablet Take 100 mcg by mouth daily.   . [DISCONTINUED] BRILINTA 60 MG TABS tablet TAKE 1 TABLET (60 MG TOTAL) BY MOUTH 2 (TWO) TIMES DAILY.     Allergies:   Patient has no known allergies.   Social History   Tobacco Use  . Smoking status: Former Smoker    Types: Cigarettes    Quit date: 05/07/1973    Years since quitting: 45.5  . Smokeless tobacco: Never Used  Substance Use Topics  . Alcohol use: No  . Drug use: No     Family Hx: The patient's family history includes Coronary artery disease in his brother and brother; Coronary artery disease (age of onset: 25) in his brother; Heart attack in his maternal grandfather; Heart disease in his father; Hypertension in  his brother, brother, brother, father, mother, paternal grandfather, sister, sister, and son; Lupus in his daughter; Pancreatic cancer in his father; Stroke in his mother; Throat cancer in his brother. There is no history of Colon cancer, Esophageal cancer, Stomach cancer, or Rectal cancer.  ROS:   Please see the history of present illness.     All other systems reviewed and are negative.   Prior CV studies:   The following studies were reviewed today:     Labs/Other Tests and Data Reviewed:    EKG:  An ECG dated 10/24/17 was personally reviewed today and demonstrated:  sinus rhythm, first degree heart block, ST depression inferior leads and lateral leads, similar to prior HR 57   Recent Labs: 01/09/2018: ALT 18; BUN 13; Creatinine, Ser 1.67; Hemoglobin 14.2; Platelets 183.0; Potassium 4.5; Sodium 138   Recent Lipid Panel Lab Results  Component Value Date/Time   CHOL 131 10/04/2017 11:06 AM   CHOL 137 11/22/2014 11:17 AM   TRIG 160 (H) 10/04/2017 11:06 AM   TRIG 160 (H) 11/22/2014 11:17 AM   TRIG 216 (HH) 03/04/2006 09:46 AM   HDL 42 10/04/2017 11:06 AM   HDL 43 11/22/2014 11:17 AM   CHOLHDL 3.1 10/04/2017 11:06 AM   CHOLHDL 3.4 08/17/2015 11:58 AM   LDLCALC 57 10/04/2017 11:06 AM    LDLCALC 62 11/22/2014 11:17 AM   LDLDIRECT 66.0 01/09/2018 12:29 PM    Wt Readings from Last 3 Encounters:  10/30/18 210 lb (95.3 kg)  01/09/18 216 lb (98 kg)  01/01/18 215 lb (97.5 kg)     Objective:    Vital Signs:  BP 118/78   Pulse 64   Ht 5\' 11"  (1.803 m)   Wt 210 lb (95.3 kg)   BMI 29.29 kg/m    VITAL SIGNS:  reviewed GEN:  no acute distress EYES:  sclerae anicteric, EOMI - Extraocular Movements Intact RESPIRATORY:  normal respiratory effort, symmetric expansion CARDIOVASCULAR:  no peripheral edema SKIN:  no rash, lesions or ulcers. MUSCULOSKELETAL:  no obvious deformities. NEURO:  alert and oriented x 3, no obvious focal deficit PSYCH:  normal affect  ASSESSMENT & PLAN:     CAD s/p CABG and multiple interventions Remains on ASA and 60 mg brilinta BID. No bleeding problems. Brilinta is now too expensive. I will switch to plavix 75 mg daily. Dr. Claiborne Billings to confirm. Taking imdur and ranexa.    Hyperlipidemia LDL 66 (01/2018). On 40 mg crestor.  Will repeat fasting labs and LFTs.    Hypertension Pressure has been 118/78. No medication changes. He has not had labs since last fall. I will collect BMP.  Valsartan 40 mg,    OSA compliant on CPAP He is now using CPAP. He has had his CPAP machine for about 5 years and he is interested in updating his machine and mask. I will send a message to Barry Brunner about what needs to happen to upgrade.    CKD stage III He has seen nephrology once, but has not followed. Will repeat labs today to make sure renal function is stable on ARB.     Right shoulder pain Will refer to ortho   COVID-19 Education: The signs and symptoms of COVID-19 were discussed with the patient and how to seek care for testing (follow up with PCP or arrange E-visit).  The importance of social distancing was discussed today.   Time:   Today, I have spent 19 minutes with the patient with telehealth technology discussing the above problems.  Medication Adjustments/Labs and Tests Ordered: Current medicines are reviewed at length with the patient today.  Concerns regarding medicines are outlined above.   Tests Ordered: Orders Placed This Encounter  Procedures  . CBC  . Comprehensive metabolic panel  . Lipid panel  . AMB referral to orthopedics    Medication Changes: Meds ordered this encounter  Medications  . clopidogrel (PLAVIX) 75 MG tablet    Sig: Take 1 tablet (75 mg total) by mouth daily.    Dispense:  90 tablet    Refill:  1    Follow Up:  Virtual Visit or In Person in 6 month(s)  Signed, Ledora Bottcher, [place]  10/30/2018 11:02 AM    [place]

## 2018-10-29 NOTE — Telephone Encounter (Signed)
I called and talked to pt's wife to confirm his appointment with [name] on 10-30-18.

## 2018-10-30 ENCOUNTER — Telehealth: Payer: Medicare Other | Admitting: Physician Assistant

## 2018-10-30 ENCOUNTER — Telehealth (INDEPENDENT_AMBULATORY_CARE_PROVIDER_SITE_OTHER): Payer: Medicare Other | Admitting: Physician Assistant

## 2018-10-30 ENCOUNTER — Encounter: Payer: Self-pay | Admitting: Physician Assistant

## 2018-10-30 VITALS — BP 118/78 | HR 64 | Ht 71.0 in | Wt 210.0 lb

## 2018-10-30 DIAGNOSIS — Z9989 Dependence on other enabling machines and devices: Secondary | ICD-10-CM | POA: Diagnosis not present

## 2018-10-30 DIAGNOSIS — I10 Essential (primary) hypertension: Secondary | ICD-10-CM | POA: Diagnosis not present

## 2018-10-30 DIAGNOSIS — I25118 Atherosclerotic heart disease of native coronary artery with other forms of angina pectoris: Secondary | ICD-10-CM

## 2018-10-30 DIAGNOSIS — I25810 Atherosclerosis of coronary artery bypass graft(s) without angina pectoris: Secondary | ICD-10-CM

## 2018-10-30 DIAGNOSIS — E785 Hyperlipidemia, unspecified: Secondary | ICD-10-CM | POA: Diagnosis not present

## 2018-10-30 DIAGNOSIS — M25511 Pain in right shoulder: Secondary | ICD-10-CM

## 2018-10-30 DIAGNOSIS — Z951 Presence of aortocoronary bypass graft: Secondary | ICD-10-CM

## 2018-10-30 DIAGNOSIS — G4733 Obstructive sleep apnea (adult) (pediatric): Secondary | ICD-10-CM | POA: Diagnosis not present

## 2018-10-30 MED ORDER — CLOPIDOGREL BISULFATE 75 MG PO TABS: 75.0000 mg | ORAL_TABLET | Freq: Every day | ORAL | 1 refills | Status: DC

## 2018-10-30 NOTE — Patient Instructions (Addendum)
Medication Instructions:  STOP Brilinta---ok to finish what you have left  START Plavix (Clopidogrel) 75 mg daily---after finishing Brilinta  If you need a refill on your cardiac medications before your next appointment, please call your pharmacy.   Lab work: Your physician recommends that you return for a FASTING lipid profile, CMET, and CBC at your earliest convenience. I am mailing your lab slips to you. Please bring them with you when you come in for blood work. No appointment is needed for this.  If you have labs (blood work) drawn today and your tests are completely normal, you will receive your results only by: Marland Kitchen MyChart Message (if you have MyChart) OR . A paper copy in the mail If you have any lab test that is abnormal or we need to change your treatment, we will call you to review the results.   Follow-Up: At [facility], you and your health needs are our priority.  As part of our continuing mission to provide you with exceptional heart care, we have created designated Provider Care Teams.  These Care Teams include your primary Cardiologist (physician) and Advanced Practice Providers (APPs -  Physician Assistants and Nurse Practitioners) who all work together to provide you with the care you need, when you need it. . Please call our office to schedule a follow-up appointment with Dr. Claiborne Billings 2 months in advance. You will need to follow-up in 6 months.  Any Other Special Instructions Will Be Listed Below (If Applicable). You have been referred to Orthopedics for you shoulder pain. They will call you to schedule an appointment.

## 2018-11-03 ENCOUNTER — Encounter: Payer: Self-pay | Admitting: Orthopaedic Surgery

## 2018-11-03 ENCOUNTER — Ambulatory Visit (INDEPENDENT_AMBULATORY_CARE_PROVIDER_SITE_OTHER): Payer: Medicare Other

## 2018-11-03 ENCOUNTER — Other Ambulatory Visit: Payer: Self-pay

## 2018-11-03 ENCOUNTER — Ambulatory Visit (INDEPENDENT_AMBULATORY_CARE_PROVIDER_SITE_OTHER): Payer: Medicare Other | Admitting: Orthopaedic Surgery

## 2018-11-03 DIAGNOSIS — G8929 Other chronic pain: Secondary | ICD-10-CM

## 2018-11-03 DIAGNOSIS — I25810 Atherosclerosis of coronary artery bypass graft(s) without angina pectoris: Secondary | ICD-10-CM

## 2018-11-03 DIAGNOSIS — M25511 Pain in right shoulder: Secondary | ICD-10-CM

## 2018-11-03 MED ORDER — METHYLPREDNISOLONE ACETATE 40 MG/ML IJ SUSP
40.0000 mg | INTRAMUSCULAR | Status: AC | PRN
  Administered 2018-11-03: 40 mg via INTRA_ARTICULAR

## 2018-11-03 MED ORDER — LIDOCAINE HCL 1 % IJ SOLN
3.0000 mL | INTRAMUSCULAR | Status: AC | PRN
  Administered 2018-11-03: 3 mL

## 2018-11-03 NOTE — Progress Notes (Signed)
Office Visit Note   Patient: Joshua Hahn           Date of Birth: 04/16/1946           MRN: [id number] Visit Date: 11/03/2018              Requested by: Ledora Bottcher, [address] [place],  [place] [address] PCP: Marin Olp, MD   Assessment & Plan: Visit Diagnoses:  1. Chronic right shoulder pain     Plan: He does have quite significant impingement syndrome of his right shoulder.  I showed him exercises along the try and offered a steroid injection subacromial outlet since he is on blood thinners and cannot take anti-inflammatories.  I explained the risk and benefits of injections and he agreed to this and tolerated it well.  Of also recommended Voltaren gel.  All question concerns were answered and addressed.  Follow-up will be as needed with no and we can always repeat an injection in about 8 to 12 weeks if needed.  Follow-Up Instructions: Return if symptoms worsen or fail to improve.   Orders:  Orders Placed This Encounter  Procedures  . Large Joint Inj  . XR Shoulder Right   No orders of the defined types were placed in this encounter.     Procedures: Large Joint Inj: R subacromial bursa on 11/03/2018 8:42 AM Indications: pain and diagnostic evaluation Details: 22 G 1.5 in needle  Arthrogram: No  Medications: 3 mL lidocaine 1 %; 40 mg methylPREDNISolone acetate 40 MG/ML Outcome: tolerated well, no immediate complications Procedure, treatment alternatives, risks and benefits explained, specific risks discussed. Consent was given by the patient. Immediately prior to procedure a time out was called to verify the correct patient, procedure, equipment, support staff and site/side marked as required. Patient was prepped and draped in the usual sterile fashion.       Clinical Data: No additional findings.   Subjective: Chief Complaint  Patient presents with  . Right Shoulder - Pain  The patient is a very pleasant and active 73 year old  gentleman who comes in for evaluation treatment of right shoulder pain.  He denies any injuries.  He is right-hand dominant.  It has been hurting for about 6 to 8 months now and mainly hurts at night.  Is not a constant pain.  It hurts with overhead activities and reaching behind him.  He denies any neck pain.  Denies any numbness tingling in his right hand.  He is not a diabetic and not a smoker.  HPI  Review of Systems He currently denies any headache, chest pain, shortness of breath, fever, chills, nausea, vomiting  Objective: Vital Signs: There were no vitals taken for this visit.  Physical Exam He is alert and orient x3 and in no acute distress Ortho Exam Examination of his right shoulder shows full abduction and full external rotation.  His internal rotation with adduction is just to the lower lumbar spine and definitely painful.  He has positive Neer and Hawkins signs.  The shoulder is well located.  He is got excellent strength in his rotator cuff.  His liftoff is negative. Specialty Comments:  No specialty comments available.  Imaging: Xr Shoulder Right  Result Date: 11/03/2018 3 views of the right shoulder show significant AC joint arthritis and a small inferior bone spur suggesting glenohumeral arthritis.  The subacromial outlet is well-maintained.    PMFS History: Patient Active Problem List   Diagnosis Date Noted  .  History of adenomatous polyp of colon 12/10/2014  . 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 Chronic renal insufficiency, stage III (moderate) ([place]) 02/05/2013  . CAD - PCI 1980s, CABG X 4 1994, DES CFX 08/2008, stent 2014 nstemi 01/11/2013  . Old MI (myocardial infarction) 01/11/2013  . OSA on CPAP 01/11/2013  . ADENOCARCINOMA, PROSTATE, GLEASON GRADE 7 04/05/2009  . Hyperlipidemia 12/18/2006  . Essential hypertension 12/18/2006   Past Medical History:  Diagnosis Date  . Chronic kidney disease (CKD), stage III (moderate) (HCC)   . Coronary artery disease 1994   PCI'80s,  CABG '94, CFX DES 4/10  . Diverticulosis   . HTN (hypertension) 10/16/2011   Echo -EF 50-55% ,LV NORMAL  . 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 levels LDL IIIa+b% and HDL2b%  . Hypertension   . Myocardial infarct ([place])   . Myocardial infarction [facility]) 1994   infer[facility] wall [facility] at [facility]  . Prostate cancer ([place])   . Sleep apnea    uses CPAP PRN  . Sleep apnea, obstructive    using C-PAP    Family History  Problem Relation Age of Onset  . Pancreatic cancer Father   . Heart disease Father   . Hypertension Father   . Stroke Mother   . Hypertension Mother   . Coronary artery disease Brother 55       2 bros with early CAD  . Hypertension Brother   . Coronary artery disease Brother   . Hypertension Brother   . Coronary artery disease Brother   . Hypertension Brother   . Heart attack Maternal Grandfather   . Hypertension Paternal Grandfather   . Lupus Daughter   . Hypertension Son   . Hypertension Sister   . Hypertension Sister   . Throat cancer Brother   . Colon cancer Neg Hx   . Esophageal cancer Neg Hx   . Stomach cancer Neg Hx   . Rectal cancer Neg Hx     Past Surgical History:  Procedure Laterality Date  . ABDOMINAL SURGERY    . CARDIAC CATHETERIZATION  06/23/2008   occlusion sequential OM1 andOM2 graft as well vein graft to RCA.LAD and diagonal system remain intact. RCA  collateralized  . CORONARY ANGIOPLASTY WITH STENT PLACEMENT    . CORONARY ARTERY BYPASS GRAFT  1994  . EXPLORATORY LAPAROTOMY     ideopathic retroperitoneal fibrosis  . LEFT HEART CATHETERIZATION WITH CORONARY/GRAFT ANGIOGRAM N/A 02/04/2013   Procedure: LEFT HEART CATHETERIZATION WITH Beatrix Fetters;  Surgeon: [name], MD;  Location[facility] C[facility] LAB;  Service: Cardiovascular;  Laterality: N/A;  . LEFT PAROTID GLAND SURGRY  41/6606   Dr. Erik Obey  . PERCUTANEOUS CORONARY STENT INTERVENTION  (PCI-S)  06/25/2008   rotational atherectomy w/diffuse disease native circ. w/drug eluting stents , PTCA on one vessel; grat occlusion to RCA w/native RCA proxim. occlusion. Circ.supplied collaaterals distal RCA  following intervention  . PERCUTANEOUS CORONARY STENT INTERVENTION (PCI-S) N/A 02/05/2013   Procedure: PERCUTANEOUS CORONARY STENT INTERVENTION (PCI-S);  Surgeon: [name], MD;  Location: [facility] CATH LAB;  Service: Cardiovascular;  Laterality: N/A;  . PROSTATECTOMY  06/2009   robotic-asssisted laparoscopic radical by Dr Rosana Hoes   Social History   Occupational History  . Occupation: pastor  Tobacco Use  . Smoking status: Former Smoker    Types: Cigarettes    Quit date: 05/07/1973    Years since quitting: 45.5  . Smokeless tobacco: Never Used  Substance and  Sexual Activity  . Alcohol use: No  . Drug use: No  . Sexual activity: Not on file

## 2018-11-03 NOTE — Progress Notes (Signed)
Okay to switch to plavix

## 2018-11-25 ENCOUNTER — Other Ambulatory Visit: Payer: Self-pay | Admitting: *Deleted

## 2018-11-25 DIAGNOSIS — Z20822 Contact with and (suspected) exposure to covid-19: Secondary | ICD-10-CM

## 2018-11-26 ENCOUNTER — Other Ambulatory Visit: Payer: Self-pay

## 2018-11-26 DIAGNOSIS — Z20822 Contact with and (suspected) exposure to covid-19: Secondary | ICD-10-CM

## 2018-11-30 ENCOUNTER — Other Ambulatory Visit: Payer: Self-pay | Admitting: Cardiovascular Disease

## 2018-11-30 LAB — NOVEL CORONAVIRUS, NAA: SARS-CoV-2, NAA: NOT DETECTED

## 2019-01-19 ENCOUNTER — Other Ambulatory Visit: Payer: Self-pay | Admitting: Cardiovascular Disease

## 2019-01-31 ENCOUNTER — Encounter

## 2019-02-03 DIAGNOSIS — E785 Hyperlipidemia, unspecified: Secondary | ICD-10-CM | POA: Diagnosis not present

## 2019-02-03 DIAGNOSIS — I25118 Atherosclerotic heart disease of native coronary artery with other forms of angina pectoris: Secondary | ICD-10-CM | POA: Diagnosis not present

## 2019-02-03 DIAGNOSIS — I10 Essential (primary) hypertension: Secondary | ICD-10-CM | POA: Diagnosis not present

## 2019-02-03 LAB — CBC
Hematocrit: 46.3 % (ref 37.5–51.0)
Hemoglobin: 15 g/dL (ref 13.0–17.7)
MCH: 30.1 pg (ref 26.6–33.0)
MCHC: 32.4 g/dL (ref 31.5–35.7)
MCV: 93 fL (ref 79–97)
Platelets: 178 10*3/uL (ref 150–450)
RBC: 4.99 x10E6/uL (ref 4.14–5.80)
RDW: 12.3 % (ref 11.6–15.4)
WBC: 7.5 10*3/uL (ref 3.4–10.8)

## 2019-02-03 LAB — COMPREHENSIVE METABOLIC PANEL
ALT: 15 IU/L (ref 0–44)
AST: 22 IU/L (ref 0–40)
Albumin/Globulin Ratio: 1.5 (ref 1.2–2.2)
Albumin: 4 g/dL (ref 3.7–4.7)
Alkaline Phosphatase: 71 IU/L (ref 39–117)
BUN/Creatinine Ratio: 6 — ABNORMAL LOW (ref 10–24)
BUN: 11 mg/dL (ref 8–27)
Bilirubin Total: 0.6 mg/dL (ref 0.0–1.2)
CO2: 23 mmol/L (ref 20–29)
Calcium: 9.8 mg/dL (ref 8.6–10.2)
Chloride: 102 mmol/L (ref 96–106)
Creatinine, Ser: 1.85 mg/dL — ABNORMAL HIGH (ref 0.76–1.27)
GFR calc Af Amer: 41 mL/min/{1.73_m2} — ABNORMAL LOW (ref >59)
GFR calc non Af Amer: 35 mL/min/{1.73_m2} — ABNORMAL LOW (ref >59)
Globulin, Total: 2.7 g/dL (ref 1.5–4.5)
Glucose: 102 mg/dL — ABNORMAL HIGH (ref 65–99)
Potassium: 4.9 mmol/L (ref 3.5–5.2)
Sodium: 138 mmol/L (ref 134–144)
Total Protein: 6.7 g/dL (ref 6.0–8.5)

## 2019-02-03 LAB — LIPID PANEL
Chol/HDL Ratio: 3.3 ratio (ref 0.0–5.0)
Cholesterol, Total: 127 mg/dL (ref 100–199)
HDL: 39 mg/dL — ABNORMAL LOW (ref >39)
LDL Chol Calc (NIH): 63 mg/dL (ref 0–99)
Triglycerides: 144 mg/dL (ref 0–149)
VLDL Cholesterol Cal: 25 mg/dL (ref 5–40)

## 2019-02-04 DIAGNOSIS — I129 Hypertensive chronic kidney disease with stage 1 through stage 4 chronic kidney disease, or unspecified chronic kidney disease: Secondary | ICD-10-CM | POA: Diagnosis not present

## 2019-02-04 DIAGNOSIS — I2510 Atherosclerotic heart disease of native coronary artery without angina pectoris: Secondary | ICD-10-CM | POA: Diagnosis not present

## 2019-02-04 DIAGNOSIS — N183 Chronic kidney disease, stage 3 (moderate): Secondary | ICD-10-CM | POA: Diagnosis not present

## 2019-02-04 DIAGNOSIS — C61 Malignant neoplasm of prostate: Secondary | ICD-10-CM | POA: Diagnosis not present

## 2019-02-10 ENCOUNTER — Ambulatory Visit (INDEPENDENT_AMBULATORY_CARE_PROVIDER_SITE_OTHER): Payer: Medicare Other

## 2019-02-10 ENCOUNTER — Other Ambulatory Visit: Payer: Self-pay

## 2019-02-10 VITALS — BP 136/72 | Temp 98.0°F | Ht 71.0 in | Wt 211.4 lb

## 2019-02-10 DIAGNOSIS — Z0000 Encounter for general adult medical examination without abnormal findings: Secondary | ICD-10-CM | POA: Diagnosis not present

## 2019-02-10 NOTE — Progress Notes (Signed)
I have reviewed and agree with note, evaluation, plan.   Sulma Ruffino, MD  

## 2019-02-10 NOTE — Patient Instructions (Signed)
Joshua Hahn , Thank you for taking time to come for your Medicare Wellness Visit. I appreciate your ongoing commitment to your health goals. Please review the following plan we discussed and let me know if I can assist you in the future.   Screening recommendations/referrals: Colorectal Screening: colonoscopy with Dr. Ardis Hughs 12/03/14  Vision and Dental Exams: Recommended annual ophthalmology exams for early detection of glaucoma and other disorders of the eye Recommended annual dental exams for proper oral hygiene  Vaccinations: Influenza vaccine:  recommended this fall either at PCP office or through your local pharmacy  Pneumococcal vaccine: up to date; last 07/10/17 Tdap vaccine: up to date; last 10/08/14  Shingles vaccine: Please call your insurance company to determine your out of pocket expense for the Shingrix vaccine. You may receive this vaccine at your local pharmacy.  Advanced directives: Please bring a copy of your POA (Power of Attorney) and/or Living Will to your next appointment.  Goals: Eat a heart healthy diet with plenty of fresh fruits and vegetables.  Limit the amount of fried foods that you eat.   Next appointment: Please schedule your Annual Wellness Visit with your Nurse Health Advisor in one year.  Preventive Care 88 Years and Older, Male Preventive care refers to lifestyle choices and visits with your health care provider that can promote health and wellness. What does preventive care include?  A yearly physical exam. This is also called an annual well check.  Dental exams once or twice a year.  Routine eye exams. Ask your health care provider how often you should have your eyes checked.  Personal lifestyle choices, including:  Daily care of your teeth and gums.  Regular physical activity.  Eating a healthy diet.  Avoiding tobacco and drug use.  Limiting alcohol use.  Practicing safe sex.  Taking low doses of aspirin every day if recommended by your  health care provider..  Taking vitamin and mineral supplements as recommended by your health care provider. What happens during an annual well check? The services and screenings done by your health care provider during your annual well check will depend on your age, overall health, lifestyle risk factors, and family history of disease. Counseling  Your health care provider may ask you questions about your:  Alcohol use.  Tobacco use.  Drug use.  Emotional well-being.  Home and relationship well-being.  Sexual activity.  Eating habits.  History of falls.  Memory and ability to understand (cognition).  Work and work Statistician. Screening  You may have the following tests or measurements:  Height, weight, and BMI.  Blood pressure.  Lipid and cholesterol levels. These may be checked every 5 years, or more frequently if you are over 87 years old.  Skin check.  Lung cancer screening. You may have this screening every year starting at age 59 if you have a 30-pack-year history of smoking and currently smoke or have quit within the past 15 years.  Fecal occult blood test (FOBT) of the stool. You may have this test every year starting at age 67.  Flexible sigmoidoscopy or colonoscopy. You may have a sigmoidoscopy every 5 years or a colonoscopy every 10 years starting at age 68.  Prostate cancer screening. Recommendations will vary depending on your family history and other risks.  Hepatitis C blood test.  Hepatitis B blood test.  Sexually transmitted disease (STD) testing.  Diabetes screening. This is done by checking your blood sugar (glucose) after you have not eaten for a while (fasting). You  may have this done every 1-3 years.  Abdominal aortic aneurysm (AAA) screening. You may need this if you are a current or former smoker.  Osteoporosis. You may be screened starting at age 83 if you are at high risk. Talk with your health care provider about your test results,  treatment options, and if necessary, the need for more tests. Vaccines  Your health care provider may recommend certain vaccines, such as:  Influenza vaccine. This is recommended every year.  Tetanus, diphtheria, and acellular pertussis (Tdap, Td) vaccine. You may need a Td booster every 10 years.  Zoster vaccine. You may need this after age 95.  Pneumococcal 13-valent conjugate (PCV13) vaccine. One dose is recommended after age 58.  Pneumococcal polysaccharide (PPSV23) vaccine. One dose is recommended after age 69. Talk to your health care provider about which screenings and vaccines you need and how often you need them. This information is not intended to replace advice given to you by your health care provider. Make sure you discuss any questions you have with your health care provider. Document Released: 05/20/2015 Document Revised: 01/11/2016 Document Reviewed: 02/22/2015 Elsevier Interactive Patient Education  2017 [place] Prevention in the Home Falls can cause injuries. They can happen to people of all ages. There are many things you can do to make your home safe and to help prevent falls. What can I do on the outside of my home?  Regularly fix the edges of walkways and driveways and fix any cracks.  Remove anything that might make you trip as you walk through a door, such as a raised step or threshold.  Trim any bushes or trees on the path to your home.  Use bright outdoor lighting.  Clear any walking paths of anything that might make someone trip, such as rocks or tools.  Regularly check to see if handrails are loose or broken. Make sure that both sides of any steps have handrails.  Any raised decks and porches should have guardrails on the edges.  Have any leaves, snow, or ice cleared regularly.  Use sand or salt on walking paths during winter.  Clean up any spills in your garage right away. This includes oil or grease spills. What can I do in the  bathroom?  Use night lights.  Install grab bars by the toilet and in the tub and shower. Do not use towel bars as grab bars.  Use non-skid mats or decals in the tub or shower.  If you need to sit down in the shower, use a plastic, non-slip stool.  Keep the floor dry. Clean up any water that spills on the floor as soon as it happens.  Remove soap buildup in the tub or shower regularly.  Attach bath mats securely with double-sided non-slip rug tape.  Do not have throw rugs and other things on the floor that can make you trip. What can I do in the bedroom?  Use night lights.  Make sure that you have a light by your bed that is easy to reach.  Do not use any sheets or blankets that are too big for your bed. They should not hang down onto the floor.  Have a firm chair that has side arms. You can use this for support while you get dressed.  Do not have throw rugs and other things on the floor that can make you trip. What can I do in the kitchen?  Clean up any spills right away.  Avoid walking on  wet floors.  Keep items that you use a lot in easy-to-reach places.  If you need to reach something above you, use a strong step stool that has a grab bar.  Keep electrical cords out of the way.  Do not use floor polish or wax that makes floors slippery. If you must use wax, use non-skid floor wax.  Do not have throw rugs and other things on the floor that can make you trip. What can I do with my stairs?  Do not leave any items on the stairs.  Make sure that there are handrails on both sides of the stairs and use them. Fix handrails that are broken or loose. Make sure that handrails are as long as the stairways.  Check any carpeting to make sure that it is firmly attached to the stairs. Fix any carpet that is loose or worn.  Avoid having throw rugs at the top or bottom of the stairs. If you do have throw rugs, attach them to the floor with carpet tape.  Make sure that you have a  light switch at the top of the stairs and the bottom of the stairs. If you do not have them, ask someone to add them for you. What else can I do to help prevent falls?  Wear shoes that:  Do not have high heels.  Have rubber bottoms.  Are comfortable and fit you well.  Are closed at the toe. Do not wear sandals.  If you use a stepladder:  Make sure that it is fully opened. Do not climb a closed stepladder.  Make sure that both sides of the stepladder are locked into place.  Ask someone to hold it for you, if possible.  Clearly mark and make sure that you can see:  Any grab bars or handrails.  First and last steps.  Where the edge of each step is.  Use tools that help you move around (mobility aids) if they are needed. These include:  Canes.  Walkers.  Scooters.  Crutches.  Turn on the lights when you go into a dark area. Replace any light bulbs as soon as they burn out.  Set up your furniture so you have a clear path. Avoid moving your furniture around.  If any of your floors are uneven, fix them.  If there are any pets around you, be aware of where they are.  Review your medicines with your doctor. Some medicines can make you feel dizzy. This can increase your chance of falling. Ask your doctor what other things that you can do to help prevent falls. This information is not intended to replace advice given to you by your health care provider. Make sure you discuss any questions you have with your health care provider. Document Released: 02/17/2009 Document Revised: 09/29/2015 Document Reviewed: 05/28/2014 Elsevier Interactive Patient Education  2017 [employer].

## 2019-02-10 NOTE — Progress Notes (Signed)
Subjective:   Joshua Hahn is a 73 y.o. male who presents for Medicare Annual/Subsequent preventive examination.  Review of Systems:   Cardiac Risk Factors include: hypertension;male gender;advanced age (>69men, >53 women);dyslipidemia     Objective:    Vitals: BP 136/72 (BP Location: Left Arm, Patient Position: Sitting, Cuff Size: Normal)   Temp 98 F (36.7 C) (Temporal)   Ht 5\' 11"  (1.803 m)   Wt 211 lb 6.4 oz (95.9 kg)   BMI 29.48 kg/m   Body mass index is 29.48 kg/m.  Advanced Directives 02/10/2019 01/01/2018 06/06/2016 07/25/2015 12/03/2014 02/07/2013  Does Patient Have a Medical Advance Directive? Yes Yes Yes Yes Yes Patient does not have advance directive  Type of Advance Directive Living will;Healthcare Power of [place];Living will - [place];Living will [place] -  Does patient want to make changes to medical advance directive? No - Patient declined No - Patient declined - - - -  Copy of [place] in Chart? No - copy requested No - copy requested - - - -  Pre-existing out of facility DNR order (yellow form or pink MOST form) - - - - - No    Tobacco Social History   Tobacco Use  Smoking Status Former Smoker  . Types: Cigarettes  . Quit date: 05/07/1973  . Years since quitting: 45.7  Smokeless Tobacco Never Used     Counseling given: Not Answered   Clinical Intake:  Pre-visit preparation completed: Yes  Pain : No/denies pain  Diabetes: No  How often do you need to have someone help you when you read instructions, pamphlets, or other written materials from your doctor or pharmacy?: 1 - Never What is the last grade level you completed in school?: college degree  Interpreter Needed?: No  Information entered by :: Denman Quirino LPN  Past Medical History:  Diagnosis Date  . Chronic kidney disease (CKD), stage III (moderate)   . Coronary artery disease 1994   PCI'80s,  CABG '94, CFX DES 4/10  . Diverticulosis   . HTN (hypertension) 10/16/2011   Echo -EF 50-55% ,LV NORMAL  . Hyperlipidemia    2012 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 levels LDL IIIa+b% and HDL2b%  . Hypertension   . Myocardial infarct ([place])   . Myocardial infarction [facility]) 1994   inferior wall MI at [facility] hospitaland [facility]G  . Prostate cancer ([place])   . Sleep apnea    uses CPAP PRN  . Sleep apnea, obstructive    using C-PAP   Past Surgical History:  Procedure Laterality Date  . ABDOMINAL SURGERY    . CARDIAC CATHETERIZATION  06/23/2008   occlusion sequential OM1 andOM2 graft as well vein graft to RCA.LAD and diagonal system remain intact. RCA  collateralized  . CORONARY ANGIOPLASTY WITH STENT PLACEMENT    . CORONARY ARTERY BYPASS GRAFT  1994  . EXPLORATORY LAPAROTOMY     ideopathic retroperitoneal fibrosis  . LEFT HEART CATHETERIZATION WITH CORONARY/GRAFT ANGIOGRAM N/A 02/04/2013   Procedure: LEFT HEART CATHETERIZATION WITH Beatrix Fetters;  Surgeon: [name], MD;  Location: [facility] CATH LAB;  Service: Cardiovascular;  Laterality: N/A;  . LEFT PAROTID GLAND SURGRY  [id number]   Dr. Erik Obey  . PERCUTANEOUS CORONARY STENT INTERVENTION (PCI-S)  06/25/2008   rotational atherectomy w/diffuse disease native circ. w/drug eluting stents , PTCA on one vessel; grat occlusion to RCA w/native RCA proxim. occlusion. Circ.supplied collaaterals distal  RCA  following intervention  . PERCUTANEOUS CORONARY STENT INTERVENTION (PCI-S) N/A 02/05/2013   Procedure: PERCUTANEOUS CORONARY STENT INTERVENTION (PCI-S);  Surgeon: [name], MD;  Location: [facility] CATH LAB;  Service: Cardiovascular;  Laterality: N/A;  . PROSTATECTOMY  06/2009   robotic-asssisted laparoscopic radical by Dr Rosana Hoes   Family History  Problem Relation Age of Onset  . Pancreatic cancer Father   . Heart disease Father   . Hypertension Father   . Stroke Mother   .  Hypertension Mother   . Coronary artery disease Brother 62       2 bros with early CAD  . Hypertension Brother   . Coronary artery disease Brother   . Hypertension Brother   . Coronary artery disease Brother   . Hypertension Brother   . Heart attack Maternal Grandfather   . Hypertension Paternal Grandfather   . Lupus Daughter   . Hypertension Son   . Hypertension Sister   . Hypertension Sister   . Throat cancer Brother   . Colon cancer Neg Hx   . Esophageal cancer Neg Hx   . Stomach cancer Neg Hx   . Rectal cancer Neg Hx    Social History   Socioeconomic History  . Marital status: Married    Spouse name: Not on file  . Number of children: 2  . Years of education: Not on file  . Highest education level: Not on file  Occupational History  . Occupation: Theme park manager -retired     Comment: [place] [employer]  . Financial resource strain: Not on file  . Food insecurity    Worry: Not on file    Inability: Not on file  . Transportation needs    Medical: Not on file    Non-medical: Not on file  Tobacco Use  . Smoking status: Former Smoker    Types: Cigarettes    Quit date: 05/07/1973    Years since quitting: 45.7  . Smokeless tobacco: Never Used  Substance and Sexual Activity  . Alcohol use: No  . Drug use: No  . Sexual activity: Not on file  Lifestyle  . Physical activity    Days per week: Not on file    Minutes per session: Not on file  . Stress: Not on file  Relationships  . Social Herbalist on phone: Not on file    Gets together: Not on file    Attends religious service: Not on file    Active member of club or organization: Not on file    Attends meetings of clubs or organizations: Not on file    Relationship status: Not on file  Other Topics Concern  . Not on file  Social History Narrative   ** Merged History Encounter **       Married 48 years in 2016. 2 children. 5 grand kids.   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   Hobbies: speaking events, traveling    Outpatient Encounter Medications as of 02/10/2019  Medication Sig  . aspirin 81 MG tablet Take 81 mg by mouth daily.  . cholecalciferol (VITAMIN D) 1000 UNITS tablet Take 1,000 Units by mouth daily.  . clopidogrel (PLAVIX) 75 MG tablet Take 1 tablet (75 mg total) by mouth daily.  . Coenzyme Q10 (CO Q 10) 100 MG CAPS Take 300 mg by mouth every evening.   . isosorbide mononitrate (IMDUR) 60 MG 24 hr tablet TAKE 1 TABLET BY MOUTH  EVERY DAY  . metoprolol tartrate (LOPRESSOR) 25 MG tablet TAKE 1 TABLET (25 MG TOTAL) BY MOUTH EVERY MORNING AND 0.5 TABLETS (12.5 MG TOTAL) EVERY EVENING.  . nitroGLYCERIN (NITROSTAT) 0.4 MG SL tablet Place 1 tablet (0.4 mg total) under the tongue every 5 (five) minutes as needed for chest pain.  Marland Kitchen omega-3 acid ethyl esters (LOVAZA) 1 g capsule TAKE 2 CAPSULES (2 G TOTAL) BY MOUTH 2 (TWO) TIMES DAILY.  . ranolazine (RANEXA) 500 MG 12 hr tablet TAKE 1 TABLET BY MOUTH TWICE A DAY  . rosuvastatin (CRESTOR) 40 MG tablet Take 1 tablet (40 mg total) by mouth daily. Keep June Appointment  . valsartan (DIOVAN) 40 MG tablet Take 1 tablet (40 mg total) by mouth daily. To replace losartan 50mg   . vitamin B-12 (CYANOCOBALAMIN) 100 MCG tablet Take 100 mcg by mouth daily.    No facility-administered encounter medications on file as of 02/10/2019.     Activities of Daily Living In your present state of health, do you have any difficulty performing the following activities: 02/10/2019  Hearing? N  Vision? N  Difficulty concentrating or making decisions? N  Walking or climbing stairs? N  Dressing or bathing? N  Doing errands, shopping? N  Preparing Food and eating ? N  Using the Toilet? N  In the past six months, have you accidently leaked urine? N  Do you have problems with loss of bowel control? N  Managing your Medications? N  Managing your Finances? N  Housekeeping or managing your Housekeeping? N  Some recent data might be  hidden    Patient Care Team: Marin Olp, MD as PCP - General (Family Medicine) [name], MD as PCP - Cardiology (Cardiology) Marin Olp, MD (Family Medicine) Inda Coke, [place] as Physician Assistant (Physician Assistant)   Assessment:   This is a routine wellness examination for [place].  Exercise Activities and Dietary recommendations Current Exercise Habits: Home exercise routine, Type of exercise: walking;Other - see comments;strength training/weights(yardwork), Time (Minutes): 40, Frequency (Times/Week): 5, Weekly Exercise (Minutes/Week): 200, Intensity: Mild  Goals    . Increase physical activity     Maintain home gym exercise program, looking to join local [employer] for social interaction also       Fall Risk Fall Risk  02/10/2019 01/01/2018 01/01/2018 07/10/2017 06/07/2016  Falls in the past year? 0 No No No Yes  Comment - - - - -  Number falls in past yr: - - - - 1  Injury with Fall? 0 - - - No  Follow up Education provided;Falls prevention discussed;Falls evaluation completed - - - -   Is the patient's home free of loose throw rugs in walkways, pet beds, electrical cords, etc?   yes      Grab bars in the bathroom? yes      Handrails on the stairs?   yes      Adequate lighting?   yes  Timed Get Up and Go Performed: completed and within normal timeframe; no gait abnormalities noted    Depression Screen PHQ 2/9 Scores 02/10/2019 01/01/2018 01/01/2018 07/10/2017  PHQ - 2 Score 0 0 0 0  PHQ- 9 Score - 0 - -    Cognitive Function- no cognitive concerns at this time      6CIT Screen 02/10/2019 01/01/2018  What Year? 0 points 0 points  What month? 0 points 0 points  What time? 0 points 0 points  Count back from 20 0 points 0 points  Months  in reverse 0 points 0 points  Repeat phrase 0 points -  Total Score 0 -    Immunization History  Administered Date(s) Administered  . Influenza-Unspecified 08/06/2017  . Pneumococcal Conjugate-13 09/28/2014  .  Pneumococcal Polysaccharide-23 07/10/2017  . Td 05/08/1999, 10/08/2014    Qualifies for Shingles Vaccine? Discussed and patient will check with pharmacy for coverage.  Patient education handout provided    Screening Tests Health Maintenance  Topic Date Due  . INFLUENZA VACCINE  12/06/2018  . COLONOSCOPY  12/03/2019  . TETANUS/TDAP  10/07/2024  . Hepatitis C Screening  Completed  . PNA vac Low Risk Adult  Completed   Cancer Screenings: Lung: Low Dose CT Chest recommended if Age 50-80 years, 30 pack-year currently smoking OR have quit w/in 15years. Patient does not qualify. Colorectal: colonoscopy 12/03/14 with Dr. Ardis Hughs       Plan:  I have personally reviewed and addressed the Medicare Annual Wellness questionnaire and have noted the following in the patient's chart:  A. Medical and social history B. Use of alcohol, tobacco or illicit drugs  C. Current medications and supplements D. Functional ability and status E.  Nutritional status F.  Physical activity G. Advance directives H. List of other physicians I.  Hospitalizations, surgeries, and ER visits in previous 12 months J.  [place] such as hearing and vision if needed, cognitive and depression L. Referrals, records requested, and appointments- none   In addition, I have reviewed and discussed with patient certain preventive protocols, quality metrics, and best practice recommendations. A written personalized care plan for preventive services as well as general preventive health recommendations were provided to patient.   Signed,  Denman Bruce, LPN  Nurse Health Advisor   Nurse Notes: no additional

## 2019-02-15 ENCOUNTER — Other Ambulatory Visit: Payer: Self-pay | Admitting: Cardiovascular Disease

## 2019-04-23 ENCOUNTER — Other Ambulatory Visit: Payer: Self-pay | Admitting: Cardiovascular Disease

## 2019-05-14 ENCOUNTER — Other Ambulatory Visit: Payer: Self-pay | Admitting: Cardiovascular Disease

## 2019-07-04 ENCOUNTER — Other Ambulatory Visit: Payer: Self-pay | Admitting: Cardiovascular Disease

## 2019-07-22 ENCOUNTER — Other Ambulatory Visit: Payer: Self-pay | Admitting: Cardiovascular Disease

## 2019-07-29 ENCOUNTER — Encounter: Payer: Self-pay | Admitting: Family Medicine

## 2019-08-06 DIAGNOSIS — H35033 Hypertensive retinopathy, bilateral: Secondary | ICD-10-CM | POA: Diagnosis not present

## 2019-08-06 DIAGNOSIS — H401112 Primary open-angle glaucoma, right eye, moderate stage: Secondary | ICD-10-CM | POA: Diagnosis not present

## 2019-08-06 DIAGNOSIS — H35013 Changes in retinal vascular appearance, bilateral: Secondary | ICD-10-CM | POA: Diagnosis not present

## 2019-08-06 DIAGNOSIS — H401121 Primary open-angle glaucoma, left eye, mild stage: Secondary | ICD-10-CM | POA: Diagnosis not present

## 2019-08-09 ENCOUNTER — Other Ambulatory Visit: Payer: Self-pay | Admitting: Cardiovascular Disease

## 2019-08-15 ENCOUNTER — Other Ambulatory Visit: Payer: Self-pay | Admitting: Cardiovascular Disease

## 2019-08-28 ENCOUNTER — Other Ambulatory Visit: Payer: Self-pay | Admitting: Cardiovascular Disease

## 2019-09-16 ENCOUNTER — Other Ambulatory Visit: Payer: Self-pay | Admitting: Physician Assistant

## 2019-09-16 MED ORDER — CLOPIDOGREL BISULFATE 75 MG PO TABS: 75.0000 mg | ORAL_TABLET | Freq: Every day | ORAL | 0 refills | Status: DC

## 2019-09-16 NOTE — Telephone Encounter (Signed)
  *  STAT* If patient is at the pharmacy, call can be transferred to refill team.   1. Which medications need to be refilled? (please list name of each medication and dose if known)   clopidogrel (PLAVIX) 75 MG tablet    2. Which pharmacy/location (including street and city if local pharmacy) is medication to be sent to? [id number] [place], [place] - [address]  3. Do they need a 30 day or 90 day supply? 90 days

## 2019-09-23 ENCOUNTER — Other Ambulatory Visit: Payer: Self-pay | Admitting: Cardiovascular Disease

## 2019-09-30 ENCOUNTER — Other Ambulatory Visit: Payer: Self-pay

## 2019-09-30 MED ORDER — RANOLAZINE ER 500 MG PO TB12: 500.0000 mg | ORAL_TABLET | Freq: Two times a day (BID) | ORAL | 2 refills | Status: DC

## 2019-10-22 ENCOUNTER — Other Ambulatory Visit: Payer: Self-pay | Admitting: Cardiovascular Disease

## 2019-11-02 ENCOUNTER — Other Ambulatory Visit: Payer: Self-pay | Admitting: Cardiovascular Disease

## 2019-11-25 DIAGNOSIS — H401121 Primary open-angle glaucoma, left eye, mild stage: Secondary | ICD-10-CM | POA: Diagnosis not present

## 2019-11-25 DIAGNOSIS — H401112 Primary open-angle glaucoma, right eye, moderate stage: Secondary | ICD-10-CM | POA: Diagnosis not present

## 2019-12-01 ENCOUNTER — Other Ambulatory Visit: Payer: Self-pay | Admitting: Cardiovascular Disease

## 2019-12-22 ENCOUNTER — Other Ambulatory Visit: Payer: Self-pay

## 2019-12-22 MED ORDER — OMEGA-3-ACID ETHYL ESTERS 1 G PO CAPS: 1.0000 g | ORAL_CAPSULE | Freq: Two times a day (BID) | ORAL | 1 refills | Status: DC

## 2019-12-28 ENCOUNTER — Telehealth: Payer: Self-pay | Admitting: Cardiovascular Disease

## 2019-12-28 NOTE — Telephone Encounter (Signed)
*  STAT* If patient is at the pharmacy, call can be transferred to refill team.   1. Which medications need to be refilled? (please list name of each medication and dose if known) clopidogrel 75 mg  2. Which pharmacy/location (including street and city if local pharmacy) is medication to be sent to? [facility] [place]   3. Do they need a 30 day or 90 day supply? [place]

## 2019-12-29 ENCOUNTER — Other Ambulatory Visit: Payer: Self-pay

## 2019-12-29 MED ORDER — CLOPIDOGREL BISULFATE 75 MG PO TABS: 75.0000 mg | ORAL_TABLET | Freq: Every day | ORAL | 1 refills | Status: DC

## 2019-12-29 MED ORDER — RANOLAZINE ER 500 MG PO TB12: 500.0000 mg | ORAL_TABLET | Freq: Two times a day (BID) | ORAL | 1 refills | Status: DC

## 2019-12-29 NOTE — Telephone Encounter (Signed)
° ° ° ° °  Please send   clopidogrel (PLAVIX) 75 MG tablet   [facility]  [address], [place], [place] [address]   Pt said she is out of medication for 4 days now

## 2020-01-13 DIAGNOSIS — C61 Malignant neoplasm of prostate: Secondary | ICD-10-CM | POA: Diagnosis not present

## 2020-01-13 DIAGNOSIS — N183 Chronic kidney disease, stage 3 unspecified: Secondary | ICD-10-CM | POA: Diagnosis not present

## 2020-01-13 DIAGNOSIS — I129 Hypertensive chronic kidney disease with stage 1 through stage 4 chronic kidney disease, or unspecified chronic kidney disease: Secondary | ICD-10-CM | POA: Diagnosis not present

## 2020-01-13 DIAGNOSIS — I2510 Atherosclerotic heart disease of native coronary artery without angina pectoris: Secondary | ICD-10-CM | POA: Diagnosis not present

## 2020-02-09 ENCOUNTER — Other Ambulatory Visit: Payer: Self-pay | Admitting: Cardiovascular Disease

## 2020-02-09 MED ORDER — CLOPIDOGREL BISULFATE 75 MG PO TABS: 75.0000 mg | ORAL_TABLET | Freq: Every day | ORAL | 0 refills | Status: DC

## 2020-02-09 NOTE — Telephone Encounter (Signed)
*  STAT* If patient is at the pharmacy, call can be transferred to refill team.   1. Which medications need to be refilled? (please list name of each medication and dose if known) clopidogrel (PLAVIX) 75 MG tablet  2. Which pharmacy/location (including street and city if local pharmacy) is medication to be sent to? [id number] [place], [place] [address]  3. Do they need a 30 day or 90 day supply? [place]

## 2020-02-09 NOTE — Telephone Encounter (Signed)
Refill sent for Plavix.

## 2020-02-17 ENCOUNTER — Other Ambulatory Visit: Payer: Self-pay

## 2020-02-17 MED ORDER — RANOLAZINE ER 500 MG PO TB12: 500.0000 mg | ORAL_TABLET | Freq: Two times a day (BID) | ORAL | 1 refills | Status: DC

## 2020-02-24 DIAGNOSIS — Z23 Encounter for immunization: Secondary | ICD-10-CM | POA: Diagnosis not present

## 2020-03-21 ENCOUNTER — Emergency Department (HOSPITAL_BASED_OUTPATIENT_CLINIC_OR_DEPARTMENT_OTHER)
Admission: EM | Admit: 2020-03-21 | Discharge: 2020-03-21 | Disposition: A | Discharge status: Home / Self Care | Payer: Medicare Other | Attending: Emergency Medicine | Admitting: Emergency Medicine

## 2020-03-21 ENCOUNTER — Emergency Department (HOSPITAL_BASED_OUTPATIENT_CLINIC_OR_DEPARTMENT_OTHER): Payer: Medicare Other

## 2020-03-21 ENCOUNTER — Other Ambulatory Visit (HOSPITAL_BASED_OUTPATIENT_CLINIC_OR_DEPARTMENT_OTHER): Payer: Self-pay | Admitting: Student

## 2020-03-21 ENCOUNTER — Other Ambulatory Visit: Payer: Self-pay

## 2020-03-21 ENCOUNTER — Encounter (HOSPITAL_BASED_OUTPATIENT_CLINIC_OR_DEPARTMENT_OTHER): Payer: Self-pay | Admitting: *Deleted

## 2020-03-21 DIAGNOSIS — I739 Peripheral vascular disease, unspecified: Secondary | ICD-10-CM | POA: Diagnosis not present

## 2020-03-21 DIAGNOSIS — I129 Hypertensive chronic kidney disease with stage 1 through stage 4 chronic kidney disease, or unspecified chronic kidney disease: Secondary | ICD-10-CM | POA: Diagnosis not present

## 2020-03-21 DIAGNOSIS — Z8546 Personal history of malignant neoplasm of prostate: Secondary | ICD-10-CM | POA: Diagnosis not present

## 2020-03-21 DIAGNOSIS — Z955 Presence of coronary angioplasty implant and graft: Secondary | ICD-10-CM | POA: Insufficient documentation

## 2020-03-21 DIAGNOSIS — M25562 Pain in left knee: Secondary | ICD-10-CM | POA: Diagnosis not present

## 2020-03-21 DIAGNOSIS — Z7901 Long term (current) use of anticoagulants: Secondary | ICD-10-CM | POA: Insufficient documentation

## 2020-03-21 DIAGNOSIS — I2510 Atherosclerotic heart disease of native coronary artery without angina pectoris: Secondary | ICD-10-CM | POA: Diagnosis not present

## 2020-03-21 DIAGNOSIS — N183 Chronic kidney disease, stage 3 unspecified: Secondary | ICD-10-CM | POA: Insufficient documentation

## 2020-03-21 DIAGNOSIS — M25462 Effusion, left knee: Secondary | ICD-10-CM | POA: Diagnosis not present

## 2020-03-21 DIAGNOSIS — Z79899 Other long term (current) drug therapy: Secondary | ICD-10-CM | POA: Diagnosis not present

## 2020-03-21 DIAGNOSIS — Z951 Presence of aortocoronary bypass graft: Secondary | ICD-10-CM | POA: Diagnosis not present

## 2020-03-21 DIAGNOSIS — Z7982 Long term (current) use of aspirin: Secondary | ICD-10-CM | POA: Diagnosis not present

## 2020-03-21 DIAGNOSIS — Z87891 Personal history of nicotine dependence: Secondary | ICD-10-CM | POA: Insufficient documentation

## 2020-03-21 MED ORDER — DICLOFENAC SODIUM 1 % EX GEL: 2.0000 g | Freq: Four times a day (QID) | CUTANEOUS | 0 refills | Status: DC

## 2020-03-21 MED ORDER — PREDNISONE 20 MG PO TABS: 40.0000 mg | ORAL_TABLET | Freq: Every day | ORAL | 0 refills | Status: DC

## 2020-03-21 MED FILL — predniSONE 20 MG TABS: 20 | 5 days supply | Qty: 10 | Fill #0

## 2020-03-21 NOTE — ED Triage Notes (Signed)
Left knee x 4 days. Denies injury. Knee is swollen. He is ambulatory.

## 2020-03-21 NOTE — ED Notes (Signed)
Review D/C papers with pt, reviewed Rx with pt, pt states understanding, pt denies questions at this time. 

## 2020-03-21 NOTE — Discharge Instructions (Signed)
Take prednisone as prescribed. You may use the Voltaren gel as well to help with pain and swelling. Continue taking her medications as prescribed. Follow-up with your doctor listed below as needed for further evaluation of your knee. Return to the emergency room if you develop fevers, your knee becomes hot/red, if you are unable to walk or move your knee due to pain, or with any new, worsening, or concerning symptoms.

## 2020-03-21 NOTE — ED Provider Notes (Signed)
[facility] DEPARTMENT Provider Note   CSN: [id number] Arrival date & time: 03/21/20  1216     History Chief Complaint  Patient presents with  . Knee Pain    Joshua Hahn is a 74 y.o. male presenting for evaluation of knee pain and swelling.  Patient says for the past 4 days, he has had persistent knee pain and swelling.  He denies fall, trauma, or injury.  No fevers or chills.  No numbness or tingling.  Pain does not radiate.  He has not taken anything for it including Tylenol.  Pain is worse when he first tries finding it, but as he continues to use it, pain improves.  He denies history of similar.  No pain on the right side.  No history of arthritis or gout.  He does not have an orthopedic doctor.  No previous history of trauma or surgery to the knee.  He is on Plavix.  Additional history came from chart review.  Patient with a history of CKD, CAD, hypertension, hyperlipidemia, prostate cancer status post prostatectomy in 2011  HPI     Past Medical History:  Diagnosis Date  . Chronic kidney disease (CKD), stage III (moderate) (HCC)   . Coronary artery disease 1994   PCI'80s, CABG '94, CFX DES 4/10  . Diverticulosis   . HTN (hypertension) 10/16/2011   Echo -EF 50-55% ,LV NORMAL  . 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 levels LDL IIIa+b% and HDL2b%  . Hypertension   . Myocardial infarct ([place])   . Myocardial infarction [facility]) 1994   inferior wall MI at [facility]  . Prostate cancer ([place])   . Sleep apnea    uses CPAP PRN  . Sleep apnea, obstructive    using C-PAP    Patient Active Problem List   Diagnosis Date Noted  . History of adenomatous polyp of colon 12/10/2014  . 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 Chronic renal insufficiency, stage III (moderate) ([place]) 02/05/2013  . CAD - PCI 1980s, CABG X 4 1994, DES CFX 08/2008, stent 2014 nstemi 01/11/2013  . Old MI  (myocardial infarction) 01/11/2013  . OSA on CPAP 01/11/2013  . ADENOCARCINOMA, PROSTATE, GLEASON GRADE 7 04/05/2009  . Hyperlipidemia 12/18/2006  . Essential hypertension 12/18/2006    Past Surgical History:  Procedure Laterality Date  . ABDOMINAL SURGERY    . CARDIAC CATHETERIZATION  06/23/2008   occlusion sequential OM1 andOM2 graft as well vein graft to RCA.LAD and diagonal system remain intact. RCA  collateralized  . CORONARY ANGIOPLASTY WITH STENT PLACEMENT    . CORONARY ARTERY BYPASS GRAFT  1994  . EXPLORATORY LAPAROTOMY     ideopathic retroperitoneal fibrosis  . LEFT HEART CATHETERIZATION WITH CORONARY/GRAFT ANGIOGRAM N/A 02/04/2013   Procedure: LEFT HEART CATHETERIZATION WITH Beatrix Fetters;  Surgeon: [name], MD;  Location[facility] LAB;  Service: Cardiovascular;  Laterality: N/A;  . LEFT PAROTID GLAND SURGRY  27/7824   Dr. Erik Obey  . PERCUTANEOUS CORONARY STENT INTERVENTION (PCI-S)  06/25/2008   rotational atherectomy w/diffuse disease native circ. w/drug eluting stents , PTCA on one vessel; grat occlusion to RCA w/native RCA proxim. occlusion. Circ.supplied collaaterals distal RCA  following intervention  . PERCUTANEOUS CORONARY STENT INTERVENTION (PCI-S) N/A 02/05/2013   Procedure: PERCUTANEOUS CORONARY STENT INTERVENTION (PCI-S);  Surgeon: [name], MD;  Location: [facility] CATH LAB;  Service: Cardiovascular;  Laterality: N/A;  . PROSTATECTOMY  06/2009   robotic-asssisted laparoscopic  radical by Dr Rosana Hoes       Family History  Problem Relation Age of Onset  . Pancreatic cancer Father   . Heart disease Father   . Hypertension Father   . Stroke Mother   . Hypertension Mother   . Coronary artery disease Brother 66       2 bros with early CAD  . Hypertension Brother   . Coronary artery disease Brother   . Hypertension Brother   . Coronary artery disease Brother   . Hypertension Brother   . Heart attack Maternal Grandfather   . Hypertension Paternal  Grandfather   . Lupus Daughter   . Hypertension Son   . Hypertension Sister   . Hypertension Sister   . Throat cancer Brother   . Colon cancer Neg Hx   . Esophageal cancer Neg Hx   . Stomach cancer Neg Hx   . Rectal cancer Neg Hx     Social History   Tobacco Use  . Smoking status: Former Smoker    Types: Cigarettes    Quit date: 05/07/1973    Years since quitting: 46.9  . Smokeless tobacco: Never Used  Vaping Use  . Vaping Use: Never used  Substance Use Topics  . Alcohol use: No  . Drug use: No    Home Medications Prior to Admission medications   Medication Sig Start Date End Date Taking? Authorizing Provider  aspirin 81 MG tablet Take 81 mg by mouth daily.    [provider]  cholecalciferol (VITAMIN D) 1000 UNITS tablet Take 1,000 Units by mouth daily.    [provider]  clopidogrel (PLAVIX) 75 MG tablet Take 1 tablet (75 mg total) by mouth daily. 02/09/20   [name], MD  Coenzyme Q10 (CO Q 10) 100 MG CAPS Take 300 mg by mouth every evening.     [provider]  diclofenac Sodium (VOLTAREN) 1 % GEL Apply 2 g topically 4 (four) times daily. 03/21/20   Ismail Graziani, PA-C  isosorbide mononitrate (IMDUR) 60 MG 24 hr tablet Take 1 tablet (60 mg total) by mouth daily. NEED OV FOR FUTURE REFILL 09/01/19   [name], MD  metoprolol tartrate (LOPRESSOR) 25 MG tablet TAKE 1 TABLET (25 MG TOTAL) BY MOUTH EVERY MORNING AND 0.5 TABLETS (12.5 MG TOTAL) EVERY EVENING. 10/22/19   [name], MD  nitroGLYCERIN (NITROSTAT) 0.4 MG SL tablet Place 1 tablet (0.4 mg total) under the tongue every 5 (five) minutes as needed for chest pain. 10/22/16   [name], MD  omega-3 acid ethyl esters (LOVAZA) 1 g capsule Take 1 capsule (1 g total) by mouth 2 (two) times daily. NEED OV. 12/22/19   [facility], Tami Lin, PA  predniSONE (DELTASONE) 20 MG tablet Take 2 tablets (40 mg total) by mouth daily for 5 days. 03/21/20 03/26/20  Trever Streater, PA-C    ranolazine (RANEXA) 500 MG 12 hr tablet Take 1 tablet (500 mg total) by mouth 2 (two) times daily. 02/17/20   [facility], Tami Lin, PA  rosuvastatin (CRESTOR) 40 MG tablet TAKE 1 TABLET (40 MG TOTAL) BY MOUTH DAILY. KEEP JUNE APPOINTMENT 12/01/19   [name], MD  valsartan (DIOVAN) 40 MG tablet Take 1 tablet (40 mg total) by mouth daily. To replace losartan 50mg  02/07/18   [name], MD  vitamin B-12 (CYANOCOBALAMIN) 100 MCG tablet Take 100 mcg by mouth daily.     [provider]    Allergies    Patient  has no known allergies.  Review of Systems   Review of Systems  Musculoskeletal: Positive for arthralgias and joint swelling.  Hematological: Bruises/bleeds easily.  All other systems reviewed and are negative.   Physical Exam Updated Vital Signs BP 134/78   Pulse 68   Temp 97.8 F (36.6 C) (Oral)   Resp 16   Ht 5\' 11"  (1.803 m)   Wt 98.2 kg   SpO2 98%   BMI 30.19 kg/m   Physical Exam Vitals and nursing note reviewed.  Constitutional:      General: He is not in acute distress.    Appearance: He is well-developed.     Comments: Resting in bed in no acute distress  HENT:     Head: Normocephalic and atraumatic.  Eyes:     Conjunctiva/sclera: Conjunctivae normal.     Pupils: Pupils are equal, round, and reactive to light.  Cardiovascular:     Rate and Rhythm: Normal rate and regular rhythm.  Pulmonary:     Effort: Pulmonary effort is normal. No respiratory distress.     Breath sounds: Normal breath sounds. No wheezing.  Abdominal:     General: There is no distension.     Palpations: Abdomen is soft. There is no mass.     Tenderness: There is no abdominal tenderness. There is no guarding or rebound.  Musculoskeletal:        General: Swelling and tenderness present.     Cervical back: Normal range of motion and neck supple.     Comments: No erythema or warmth of the knee.  No contusions or signs of trauma.  Minimal swelling when compared to the right  knee.  Tenderness palpation of the posterior and medial knee, no pain over the lateral knee.  Patient able to flex and extend completely with mild discomfort.  Pedal pulses 2+ bilaterally.  Good distal sensation and cap refill.  No calf tenderness or swelling.  Skin:    General: Skin is warm and dry.     Capillary Refill: Capillary refill takes less than 2 seconds.  Neurological:     Mental Status: He is alert and oriented to person, place, and time.     ED Results / Procedures / Treatments   Labs (all labs ordered are listed, but only abnormal results are displayed) Labs Reviewed - No data to display  EKG None  Radiology DG Knee Complete 4 Views Left  Result Date: 03/21/2020 CLINICAL DATA:  74 year old male with sudden onset pain and swelling x4 days. No recent injury. EXAM: LEFT KNEE - COMPLETE 4+ VIEW COMPARISON:  None. FINDINGS: Bone mineralization is within normal limits. Joint spaces and alignment are preserved. Small suprapatellar joint effusion is evident. The patella appears intact. No acute osseous abnormality identified. Superimposed calcified peripheral vascular disease most apparent in the popliteal fossa. IMPRESSION: 1. Small joint effusion. No acute osseous abnormality identified. 2. Calcified peripheral vascular disease. Electronically Signed   By: Genevie Ann M.D.   On: 03/21/2020 12:54    Procedures Procedures (including critical care time)  Medications Ordered in ED Medications - No data to display  ED Course  I have reviewed the triage vital signs and the nursing notes.  Pertinent labs & imaging results that were available during my care of the patient were reviewed by me and considered in my medical decision making (see chart for details).    MDM Rules/Calculators/A&P  Patient presenting for evaluation of knee pain and swelling.  On exam, patient is nontoxic.  He is neurovascularly intact.  No erythema, warmth, fevers, or chills.   Patient is able to range knee.  Exam is not consistent with septic joint. Not c/w DVT.  Considering patient's age, favor pseudogout.  Also consider gout or arthritis.  X-ray obtained from triage read interpreted by me, does show a small joint effusion.  Discussed treatment with anti-inflammatories and close follow-up with Ortho.  I discussed prompt return to the ER with signs of infection/septic joint.  At this time, patient appears safe for discharge.  Return precautions given.  Patient states he understands and agrees to plan.  Final Clinical Impression(s) / ED Diagnoses Final diagnoses:  Acute pain of left knee  Effusion of left knee    Rx / DC Orders ED Discharge Orders         Ordered    diclofenac Sodium (VOLTAREN) 1 % GEL  4 times daily        03/21/20 1452    predniSONE (DELTASONE) 20 MG tablet  Daily        03/21/20 1452           Franchot Heidelberg, PA-C 03/21/20 1540    Charlesetta Shanks, MD 03/22/20 [phone]

## 2020-03-23 ENCOUNTER — Encounter: Payer: Self-pay | Admitting: Family Medicine

## 2020-03-23 ENCOUNTER — Other Ambulatory Visit: Payer: Self-pay

## 2020-03-23 ENCOUNTER — Ambulatory Visit (INDEPENDENT_AMBULATORY_CARE_PROVIDER_SITE_OTHER): Payer: Medicare Other | Admitting: Family Medicine

## 2020-03-23 ENCOUNTER — Ambulatory Visit: Payer: Self-pay

## 2020-03-23 VITALS — Ht 71.0 in | Wt 210.0 lb

## 2020-03-23 DIAGNOSIS — M25562 Pain in left knee: Secondary | ICD-10-CM

## 2020-03-23 DIAGNOSIS — M25462 Effusion, left knee: Secondary | ICD-10-CM | POA: Diagnosis not present

## 2020-03-23 NOTE — Patient Instructions (Signed)
Nice to meet you Please try ice as needed  Please try the exercises  Please try the rub on medicine   Please send me a message in MyChart with any questions or updates.  Please see me back in 4 weeks or as needed.   --Dr. Raeford Razor

## 2020-03-23 NOTE — Progress Notes (Signed)
Medication Samples have been provided to the patient.  Drug name: Pennsaid       Strength: 2%        Qty: 2 boxes  LOT: [id number]  Exp.Date: 09/2020  Dosing instructions: Use a pea size amount and rub gently.  The patient has been instructed regarding the correct time, dose, and frequency of taking this medication, including desired effects and most common side effects.   Sherrie Elieser, MA 2:31 PM 03/23/2020

## 2020-03-23 NOTE — Assessment & Plan Note (Signed)
Changes could be associated with pseudogout versus degenerative meniscus. -Counseled on home exercise therapy and supportive care. -Provided Pennsaid samples. -Could consider physical therapy or injection.

## 2020-03-23 NOTE — Progress Notes (Signed)
Joshua Hahn - 74 y.o. male MRN [id number]  Date of birth: 1946/02/19  SUBJECTIVE:  Including CC & ROS.  Chief Complaint  Patient presents with  . Knee Pain    left x 1 week    Joshua Hahn is a 74 y.o. male that is presenting with left knee pain.  Pain has been ongoing for about a week.  No injury or inciting event.  Pain over the medial compartment.  No history of surgery.  History of similar pain.  Is feeling better with the prednisone..  Independent review of the left knee x-ray from 11/15 shows small effusion.   Review of Systems See HPI   HISTORY: Past Medical, Surgical, Social, and Family History Reviewed & Updated per EMR.   Pertinent Historical Findings include:  Past Medical History:  Diagnosis Date  . Chronic kidney disease (CKD), stage III (moderate) (HCC)   . Coronary artery disease 1994   PCI'80s, CABG '94, CFX DES 4/10  . Diverticulosis   . HTN (hypertension) 10/16/2011   Echo -EF 50-55% ,LV NORMAL  . 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 levels LDL IIIa+b% and HDL2b%  . Hypertension   . Myocardial infarct ([place])   . Myocardial infarction [facility]) 1994   infer[facility] wall MI at [facility] CABG  . Prostate cancer ([place])   . Sleep apnea    uses CPAP PRN  . Sleep apnea, obstructive    using C-PAP    Past Surgical History:  Procedure Laterality Date  . ABDOMINAL SURGERY    . CARDIAC CATHETERIZATION  06/23/2008   occlusion sequential OM1 andOM2 graft as well vein graft to RCA.LAD and diagonal system remain intact. RCA  collateralized  . CORONARY ANGIOPLASTY WITH STENT PLACEMENT    . CORONARY ARTERY BYPASS GRAFT  1994  . EXPLORATORY LAPAROTOMY     ideopathic retroperitoneal fibrosis  . LEFT HEART CATHETERIZATION WITH CORONARY/GRAFT ANGIOGRAM N/A 02/04/2013   Procedure: LEFT HEART CATHETERIZATION WITH Beatrix Fetters;  Surgeon: [name], MD;  Location: [facility] CATH  LAB;  Service: Cardiovascular;  Laterality: N/A;  . LEFT PAROTID GLAND SURGRY  96/7893   Dr. Erik Obey  . PERCUTANEOUS CORONARY STENT INTERVENTION (PCI-S)  06/25/2008   rotational atherectomy w/diffuse disease native circ. w/drug eluting stents , PTCA on one vessel; grat occlusion to RCA w/native RCA proxim. occlusion. Circ.supplied collaaterals distal RCA  following intervention  . PERCUTANEOUS CORONARY STENT INTERVENTION (PCI-S) N/A 02/05/2013   Procedure: PERCUTANEOUS CORONARY STENT INTERVENTION (PCI-S);  Surgeon: [name], MD;  Location: [facility] CATH LAB;  Service: Cardiovascular;  Laterality: N/A;  . PROSTATECTOMY  06/2009   robotic-asssisted laparoscopic radical by Dr Rosana Hoes    Family History  Problem Relation Age of Onset  . Pancreatic cancer Father   . Heart disease Father   . Hypertension Father   . Stroke Mother   . Hypertension Mother   . Coronary artery disease Brother 69       2 bros with early CAD  . Hypertension Brother   . Coronary artery disease Brother   . Hypertension Brother   . Coronary artery disease Brother   . Hypertension Brother   . Heart attack Maternal Grandfather   . Hypertension Paternal Grandfather   . Lupus Daughter   . Hypertension Son   . Hypertension Sister   . Hypertension Sister   . Throat cancer Brother   . Colon cancer Neg Hx   . Esophageal  cancer Neg Hx   . Stomach cancer Neg Hx   . Rectal cancer Neg Hx     Social History   Socioeconomic History  . Marital status: Married    Spouse name: Not on file  . Number of children: 2  . Years of education: Not on file  . Highest education level: Not on file  Occupational History  . Occupation: Theme park manager -retired     Comment: [employer]   Tobacco Use  . Smoking status: Former Smoker    Types: Cigarettes    Quit date: 05/07/1973    Years since quitting: 46.9  . Smokeless tobacco: Never Used  Vaping Use  . Vaping Use: Never used  Substance and Sexual Activity  .  Alcohol use: No  . Drug use: No  . Sexual activity: Not on file  Other Topics Concern  . Not on file  Social History Narrative   ** Merged History Encounter **       Married 48 years in 2016. 2 children. 5 grand kids.   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   Hobbies: speaking events, traveling   Social Determinants of Health   Financial Resource Strain:   . Difficulty of Paying Living Expenses: Not on file  Food Insecurity:   . Worried About Charity fundraiser in the Last Year: Not on file  . Ran Out of Food in the Last Year: Not on file  Transportation Needs:   . Lack of Transportation (Medical): Not on file  . Lack of Transportation (Non-Medical): Not on file  Physical Activity:   . Days of Exercise per Week: Not on file  . Minutes of Exercise per Session: Not on file  Stress:   . Feeling of Stress : Not on file  Social Connections:   . Frequency of Communication with Friends and Family: Not on file  . Frequency of Social Gatherings with Friends and Family: Not on file  . Attends Religious Services: Not on file  . Active Member of Clubs or Organizations: Not on file  . Attends Archivist Meetings: Not on file  . Marital Status: Not on file  Intimate Partner Violence:   . Fear of Current or Ex-Partner: Not on file  . Emotionally Abused: Not on file  . Physically Abused: Not on file  . Sexually Abused: Not on file     PHYSICAL EXAM:  VS: Ht 5\' 11"  (1.803 m)   Wt 210 lb (95.3 kg)   BMI 29.29 kg/m  Physical Exam Gen: NAD, alert, cooperative with exam, well-appearing MSK:  Left knee: No obvious effusion. Range of motion. Tenderness to palpation of the medial joint space. Neurovascular intact  Limited ultrasound: Left knee:  Mild to moderate effusion. Increased hyperemia at the insertion of the quad tendon over the calcific changes of the patella. Normal-appearing patellar tendon. Normal-appearing medial joint space.  Hyperechoic changes of the  medial meniscus which could represent pseudogout.  Summary: Effusion none changes to suggest pseudogout.  Increased hyperemia of the quad tendon.  Ultrasound and interpretation by Clearance Coots, MD      ASSESSMENT & PLAN:   Knee effusion, left Changes could be associated with pseudogout versus degenerative meniscus. -Counseled on home exercise therapy and supportive care. -Provided Pennsaid samples. -Could consider physical therapy or injection.

## 2020-04-13 ENCOUNTER — Encounter: Payer: Self-pay | Admitting: Cardiovascular Disease

## 2020-04-13 ENCOUNTER — Ambulatory Visit (INDEPENDENT_AMBULATORY_CARE_PROVIDER_SITE_OTHER): Payer: Medicare Other | Admitting: Cardiovascular Disease

## 2020-04-13 ENCOUNTER — Other Ambulatory Visit: Payer: Self-pay

## 2020-04-13 VITALS — BP 160/84 | HR 59 | Ht 70.0 in | Wt 213.0 lb

## 2020-04-13 DIAGNOSIS — I25810 Atherosclerosis of coronary artery bypass graft(s) without angina pectoris: Secondary | ICD-10-CM

## 2020-04-13 DIAGNOSIS — I10 Essential (primary) hypertension: Secondary | ICD-10-CM

## 2020-04-13 DIAGNOSIS — N1832 Chronic kidney disease, stage 3b: Secondary | ICD-10-CM

## 2020-04-13 DIAGNOSIS — Z9989 Dependence on other enabling machines and devices: Secondary | ICD-10-CM

## 2020-04-13 DIAGNOSIS — E785 Hyperlipidemia, unspecified: Secondary | ICD-10-CM | POA: Diagnosis not present

## 2020-04-13 DIAGNOSIS — G4733 Obstructive sleep apnea (adult) (pediatric): Secondary | ICD-10-CM | POA: Diagnosis not present

## 2020-04-13 DIAGNOSIS — Z951 Presence of aortocoronary bypass graft: Secondary | ICD-10-CM

## 2020-04-13 DIAGNOSIS — Z79899 Other long term (current) drug therapy: Secondary | ICD-10-CM

## 2020-04-13 MED ORDER — VALSARTAN 80 MG PO TABS
80.0000 mg | ORAL_TABLET | Freq: Every day | ORAL | 3 refills | Status: DC
Start: 2020-04-13 — End: 2021-04-07

## 2020-04-13 NOTE — Patient Instructions (Signed)
Medication Instructions:  INCREASE your valsartan to 80 mg once a day *If you need a refill on your cardiac medications before your next appointment, please call your pharmacy*   Lab Work: CMET, CBC, lipids, TSH, and Hbg A1c - Fasting prior to your next appointment If you have labs (blood work) drawn today and your tests are completely normal, you will receive your results only by: Marland Kitchen MyChart Message (if you have MyChart) OR . A paper copy in the mail If you have any lab test that is abnormal or we need to change your treatment, we will call you to review the results.   Testing/Procedures: Your physician has requested that you have a lexiscan myoview. A cardiac stress test is a cardiological test that measures the heart's ability to respond to stress in a controlled clinical environment. The stress response is induced by intravenous pharmacological stimulation. PLEASE HOLD: All caffeine, "decaf" products and chocolate 12 hours before the testing. Please follow the instruction sheet as given.  For further information, please visit [email or web address].    Your physician has recommended that you have a sleep study. This test records several body functions during sleep, including: brain activity, eye movement, oxygen and carbon dioxide blood levels, heart rate and rhythm, breathing rate and rhythm, the flow of air through your mouth and nose, snoring, body muscle movements, and chest and belly movement.      Follow-Up: At [facility], you and your health needs are our priority.  As part of our continuing mission to provide you with exceptional heart care, we have created designated Provider Care Teams.  These Care Teams include your primary Cardiologist (physician) and Advanced Practice Providers (APPs -  Physician Assistants and Nurse Practitioners) who all work together to provide you with the care you need, when you need it.  We recommend signing up for the patient portal called  "MyChart".  Sign up information is provided on this After Visit Summary.  MyChart is used to connect with patients for Virtual Visits (Telemedicine).  Patients are able to view lab/test results, encounter notes, upcoming appointments, etc.  Non-urgent messages can be sent to your provider as well.   To learn more about what you can do with MyChart, go to [email or web address].    Your next appointment:   3 month(s)  The format for your next appointment:   In Person  Provider:   Shelva Majestic, MD   Other Instructions None
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
## 2020-04-16 ENCOUNTER — Encounter: Payer: Self-pay | Admitting: Cardiovascular Disease

## 2020-04-27 ENCOUNTER — Telehealth (HOSPITAL_COMMUNITY): Payer: Self-pay | Admitting: *Deleted

## 2020-04-27 NOTE — Telephone Encounter (Signed)
Close encounter 

## 2020-04-28 DIAGNOSIS — Z951 Presence of aortocoronary bypass graft: Secondary | ICD-10-CM | POA: Diagnosis not present

## 2020-04-28 DIAGNOSIS — Z79899 Other long term (current) drug therapy: Secondary | ICD-10-CM | POA: Diagnosis not present

## 2020-04-28 DIAGNOSIS — I25810 Atherosclerosis of coronary artery bypass graft(s) without angina pectoris: Secondary | ICD-10-CM | POA: Diagnosis not present

## 2020-04-29 LAB — COMPREHENSIVE METABOLIC PANEL
ALT: 22 IU/L (ref 0–44)
AST: 29 IU/L (ref 0–40)
Albumin/Globulin Ratio: 1.9 (ref 1.2–2.2)
Albumin: 4.4 g/dL (ref 3.7–4.7)
Alkaline Phosphatase: 84 IU/L (ref 44–121)
BUN/Creatinine Ratio: 7 — ABNORMAL LOW (ref 10–24)
BUN: 14 mg/dL (ref 8–27)
Bilirubin Total: 0.7 mg/dL (ref 0.0–1.2)
CO2: 23 mmol/L (ref 20–29)
Calcium: 10.3 mg/dL — ABNORMAL HIGH (ref 8.6–10.2)
Chloride: 102 mmol/L (ref 96–106)
Creatinine, Ser: 1.9 mg/dL — ABNORMAL HIGH (ref 0.76–1.27)
GFR calc Af Amer: 39 mL/min/{1.73_m2} — ABNORMAL LOW (ref >59)
GFR calc non Af Amer: 34 mL/min/{1.73_m2} — ABNORMAL LOW (ref >59)
Globulin, Total: 2.3 g/dL (ref 1.5–4.5)
Glucose: 121 mg/dL — ABNORMAL HIGH (ref 65–99)
Potassium: 5.4 mmol/L — ABNORMAL HIGH (ref 3.5–5.2)
Sodium: 138 mmol/L (ref 134–144)
Total Protein: 6.7 g/dL (ref 6.0–8.5)

## 2020-04-29 LAB — CBC
Hematocrit: 44.8 % (ref 37.5–51.0)
Hemoglobin: 15.1 g/dL (ref 13.0–17.7)
MCH: 30.4 pg (ref 26.6–33.0)
MCHC: 33.7 g/dL (ref 31.5–35.7)
MCV: 90 fL (ref 79–97)
Platelets: 189 10*3/uL (ref 150–450)
RBC: 4.97 x10E6/uL (ref 4.14–5.80)
RDW: 11.9 % (ref 11.6–15.4)
WBC: 7.2 10*3/uL (ref 3.4–10.8)

## 2020-04-29 LAB — TSH: TSH: 2.03 u[IU]/mL (ref 0.450–4.500)

## 2020-04-29 LAB — LIPID PANEL
Chol/HDL Ratio: 3.2 ratio (ref 0.0–5.0)
Cholesterol, Total: 153 mg/dL (ref 100–199)
HDL: 48 mg/dL (ref >39)
LDL Chol Calc (NIH): 87 mg/dL (ref 0–99)
Triglycerides: 95 mg/dL (ref 0–149)
VLDL Cholesterol Cal: 18 mg/dL (ref 5–40)

## 2020-04-29 LAB — HEMOGLOBIN A1C
Est. average glucose Bld gHb Est-mCnc: 154 mg/dL
Hgb A1c MFr Bld: 7 % — ABNORMAL HIGH (ref 4.8–5.6)

## 2020-05-03 ENCOUNTER — Other Ambulatory Visit: Payer: Self-pay

## 2020-05-03 ENCOUNTER — Ambulatory Visit (HOSPITAL_COMMUNITY)
Admission: RE | Admit: 2020-05-03 | Discharge: 2020-05-03 | Disposition: A | Discharge status: Home / Self Care | Payer: Medicare Other | Source: Ambulatory Visit | Attending: Cardiology | Admitting: Cardiology

## 2020-05-03 DIAGNOSIS — I25810 Atherosclerosis of coronary artery bypass graft(s) without angina pectoris: Secondary | ICD-10-CM

## 2020-05-03 DIAGNOSIS — Z951 Presence of aortocoronary bypass graft: Secondary | ICD-10-CM | POA: Diagnosis not present

## 2020-05-03 LAB — MYOCARDIAL PERFUSION IMAGING
LV dias vol: 153 mL (ref 62–150)
LV sys vol: 83 mL
Peak HR: 73 {beats}/min
Rest HR: 52 {beats}/min
SDS: 7
SRS: 4
SSS: 11
TID: 0.93

## 2020-05-03 MED ORDER — TECHNETIUM TC 99M TETROFOSMIN IV KIT
28.2000 | PACK | Freq: Once | INTRAVENOUS | Status: AC | PRN
  Administered 2020-05-03: 28.2 via INTRAVENOUS
  Filled 2020-05-03: qty 29

## 2020-05-03 MED ORDER — TECHNETIUM TC 99M TETROFOSMIN IV KIT
10.7000 | PACK | Freq: Once | INTRAVENOUS | Status: AC | PRN
  Administered 2020-05-03: 10.7 via INTRAVENOUS
  Filled 2020-05-03: qty 11

## 2020-05-03 MED ORDER — EZETIMIBE 10 MG PO TABS: 10.0000 mg | ORAL_TABLET | Freq: Every day | ORAL | 3 refills | Status: DC

## 2020-05-03 MED ORDER — REGADENOSON 0.4 MG/5ML IV SOLN
0.4000 mg | Freq: Once | INTRAVENOUS | Status: AC
Start: 2020-05-03 — End: 2020-05-03
  Administered 2020-05-03: 0.4 mg via INTRAVENOUS

## 2020-05-06 ENCOUNTER — Other Ambulatory Visit: Payer: Self-pay | Admitting: Cardiovascular Disease

## 2020-05-12 ENCOUNTER — Ambulatory Visit (HOSPITAL_BASED_OUTPATIENT_CLINIC_OR_DEPARTMENT_OTHER): Payer: Medicare Other | Attending: Cardiovascular Disease | Admitting: Cardiovascular Disease

## 2020-05-12 ENCOUNTER — Other Ambulatory Visit: Payer: Self-pay

## 2020-05-12 DIAGNOSIS — G4733 Obstructive sleep apnea (adult) (pediatric): Secondary | ICD-10-CM | POA: Diagnosis not present

## 2020-05-12 DIAGNOSIS — Z9989 Dependence on other enabling machines and devices: Secondary | ICD-10-CM | POA: Insufficient documentation

## 2020-05-16 ENCOUNTER — Other Ambulatory Visit: Payer: Self-pay

## 2020-05-16 MED ORDER — RANOLAZINE ER 500 MG PO TB12
500.0000 mg | ORAL_TABLET | Freq: Two times a day (BID) | ORAL | 3 refills | Status: DC
Start: 2020-05-16 — End: 2021-06-23

## 2020-05-27 ENCOUNTER — Encounter (HOSPITAL_BASED_OUTPATIENT_CLINIC_OR_DEPARTMENT_OTHER): Payer: Self-pay | Admitting: Cardiovascular Disease

## 2020-05-27 NOTE — Procedures (Signed)
Patient Name: Joshua Hahn, Joshua Hahn Date: 05/12/2020 Gender: Male D.O.B: 1946/01/03 Age (years): 31 Referring Provider: Shelva Majestic MD, ABSM Height (inches): 70 Interpreting Physician: Shelva Majestic MD, ABSM Weight (lbs): 207 RPSGT: Laren Everts BMI: 30 MRN: [id number] Neck Size: 17.50  CLINICAL INFORMATION Sleep Study Type: Split Night CPAP  Indication for sleep study: Hypertension, OSA on CPAP with old non-wireless machine, Snoring  Epworth Sleepiness Score: 4  SLEEP STUDY TECHNIQUE As per the AASM Manual for the Scoring of Sleep and Associated Events v2.3 (April 2016) with a hypopnea requiring 4% desaturations.  The channels recorded and monitored were frontal, central and occipital EEG, electrooculogram (EOG), submentalis EMG (chin), nasal and oral airflow, thoracic and abdominal wall motion, anterior tibialis EMG, snore microphone, electrocardiogram, and pulse oximetry. Continuous positive airway pressure (CPAP) was initiated when the patient met split night criteria and was titrated according to treat sleep-disordered breathing.  MEDICATIONS aspirin 81 MG tablet cholecalciferol (VITAMIN D) 1000 UNITS tablet clopidogrel (PLAVIX) 75 MG tablet Coenzyme Q10 (CO Q 10) 100 MG CAPS diclofenac Sodium (VOLTAREN) 1 % GEL ezetimibe (ZETIA) 10 MG tablet isosorbide mononitrate (IMDUR) 60 MG 24 hr tablet metoprolol tartrate (LOPRESSOR) 25 MG tablet nitroGLYCERIN (NITROSTAT) 0.4 MG SL tablet omega-3 acid ethyl esters (LOVAZA) 1 g capsule ranolazine (RANEXA) 500 MG 12 hr tablet rosuvastatin (CRESTOR) 40 MG tablet valsartan (DIOVAN) 80 MG tablet vitamin B-12 (CYANOCOBALAMIN) 100 MCG tablet Medications self-administered by patient taken the night of the study : N/A  RESPIRATORY PARAMETERS Diagnostic Total AHI (/hr): 62.5 RDI (/hr): 71.4 OA Index (/hr): 0.4 CA Index (/hr): 0.4 REM AHI (/hr): N/A NREM AHI (/hr): 62.5 Supine AHI (/hr): 80.4 Non-supine AHI (/hr): 18.5 Min O2  Sat (%): 81.0 Mean O2 (%): 93.7 Time below 88% (min): 8.4   Titration Optimal Pressure (cm): 15 AHI at Optimal Pressure (/hr): 0.0 Min O2 at Optimal Pressure (%): 96.0 Supine % at Optimal (%): 100 Sleep % at Optimal (%): 43   SLEEP ARCHITECTURE The recording time for the entire night was 433.5 minutes.  During a baseline period of 254.4 minutes, the patient slept for 134.5 minutes in REM and nonREM, yielding a sleep efficiency of 52.9%%. Sleep onset after lights out was 12.4 minutes with a REM latency of N/A minutes. The patient spent 48.0%% of the night in stage N1 sleep, 52.0%% in stage N2 sleep, 0.0%% in stage N3 and 0% in REM.  During the titration period of 173.3 minutes, the patient slept for 129.0 minutes in REM and nonREM, yielding a sleep efficiency of 74.4%%. Sleep onset after CPAP initiation was 9.1 minutes with a REM latency of 48.0 minutes. The patient spent 19.0%% of the night in stage N1 sleep, 57.0%% in stage N2 sleep, 0.0%% in stage N3 and 24% in REM.  CARDIAC DATA The 2 lead EKG demonstrated sinus rhythm. The mean heart rate was 100.0 beats per minute. Other EKG findings include: None.  LEG MOVEMENT DATA The total Periodic Limb Movements of Sleep (PLMS) were 0. The PLMS index was 0.0 .  IMPRESSIONS - Severe obstructive sleep apnea occurred during the diagnostic portion of the study (AHI 62.5/h; RDI 71.4/H) with absent REM sleep. CPAP was initiated and was titrated to 15 cm of water (AHI 0, RDI 4.7/h without REM sleep at 15 cm) - No significant central sleep apnea occurred during the diagnostic portion of the study (CAI = 0.4/hour). - Moderate oxygen desaturation was noted during the diagnostic portion of the study to a nadir of 81.0% in  NREM sleep. - The patient snored with moderate snoring volume during the diagnostic portion of the study. - No cardiac abnormalities were noted during this study. - Clinically significant periodic limb movements did not occur during  sleep.  DIAGNOSIS - Obstructive Sleep Apnea (G47.33)  RECOMMENDATIONS - Recommend an initial trial of CPAP Auto at 13 - 18 cm of water with heated humidificatiion.  A Medium Wide size Philips Respironics Full Face Mask Dreamwear mask was used for the titration. - Effort should be made to optimize nasal and oropharyngeal patency. - Avoid alcohol, sedatives and other CNS depressants that may worsen sleep apnea and disrupt normal sleep architecture. - Sleep hygiene should be reviewed to assess factors that may improve sleep quality. - Weight management and regular exercise should be initiated or continued. - Recommend a download in 30 days and sleep Center evaluation after 4 weeks of therapy.  [Electronically signed] 05/27/2020 05:58 PM  Shelva Majestic MD, [facility],  [place], American Board of Sleep Medicine   NPI: [id number]  [place] PH: [phone]   FX: [phone] [place]

## 2020-06-02 ENCOUNTER — Other Ambulatory Visit: Payer: Self-pay | Admitting: Cardiovascular Disease

## 2020-06-02 DIAGNOSIS — G4733 Obstructive sleep apnea (adult) (pediatric): Secondary | ICD-10-CM

## 2020-06-14 ENCOUNTER — Encounter: Payer: Self-pay | Admitting: Family Medicine

## 2020-06-14 ENCOUNTER — Other Ambulatory Visit: Payer: Self-pay

## 2020-06-14 ENCOUNTER — Ambulatory Visit (INDEPENDENT_AMBULATORY_CARE_PROVIDER_SITE_OTHER): Payer: Medicare Other

## 2020-06-14 ENCOUNTER — Ambulatory Visit (INDEPENDENT_AMBULATORY_CARE_PROVIDER_SITE_OTHER): Payer: Medicare Other | Admitting: Family Medicine

## 2020-06-14 VITALS — BP 140/82 | HR 56 | Temp 97.3°F | Ht 70.0 in | Wt 217.4 lb

## 2020-06-14 DIAGNOSIS — I25118 Atherosclerotic heart disease of native coronary artery with other forms of angina pectoris: Secondary | ICD-10-CM

## 2020-06-14 DIAGNOSIS — W108XXA Fall (on) (from) other stairs and steps, initial encounter: Secondary | ICD-10-CM | POA: Diagnosis not present

## 2020-06-14 DIAGNOSIS — M25511 Pain in right shoulder: Secondary | ICD-10-CM

## 2020-06-14 DIAGNOSIS — S300XXA Contusion of lower back and pelvis, initial encounter: Secondary | ICD-10-CM

## 2020-06-14 DIAGNOSIS — M533 Sacrococcygeal disorders, not elsewhere classified: Secondary | ICD-10-CM | POA: Diagnosis not present

## 2020-06-14 DIAGNOSIS — M47816 Spondylosis without myelopathy or radiculopathy, lumbar region: Secondary | ICD-10-CM | POA: Diagnosis not present

## 2020-06-14 DIAGNOSIS — I10 Essential (primary) hypertension: Secondary | ICD-10-CM | POA: Diagnosis not present

## 2020-06-14 NOTE — Patient Instructions (Signed)
Please follow up if symptoms do not improve or as needed.   If your shoulder pain persists, please return in 2-4 weeks to be reevaluated.   I will let you know what your xrays show.   You may use ice on the shoulder. Keep moving the shoulder. Tylenol or advil can be used for pain as needed.    Shoulder Range of Motion Exercises Shoulder range of motion (ROM) exercises are done to keep the shoulder moving freely or to increase movement. They are often recommended for people who have shoulder pain or stiffness or who are recovering from a shoulder surgery. Phase 1 exercises When you are able, do this exercise 1-2 times per day for 30-60 seconds in each direction, or as directed by your health care provider. Pendulum exercise To do this exercise while sitting: 1. Sit in a chair or at the edge of your bed with your feet flat on the floor. 2. Let your affected arm hang down in front of you over the edge of the bed or chair. 3. Relax your shoulder, arm, and hand. [place] your body so your arm gently swings in small circles. You can also use your unaffected arm to start the motion. 5. Repeat changing the direction of the circles, swinging your arm left and right, and swinging your arm forward and back. To do this exercise while standing: 1. Stand next to a sturdy chair or table, and hold on to it with your hand on your unaffected side. 2. Bend forward at the waist. 3. Bend your knees slightly. 4. Relax your shoulder, arm, and hand. 5. While keeping your shoulder relaxed, use body motion to swing your arm in small circles. 6. Repeat changing the direction of the circles, swinging your arm left and right, and swinging your arm forward and back. 7. Between exercises, stand up tall and take a short break to relax your lower back.   Phase 2 exercises Do these exercises 1-2 times per day or as told by your health care provider. Hold each stretch for 30 seconds, and repeat 3 times. Do the exercises  with one or both arms as instructed by your health care provider. For these exercises, sit at a table with your hand and arm supported by the table. A chair that slides easily or has wheels can be helpful. External rotation 1. Turn your chair so that your affected side is nearest to the table. 2. Place your forearm on the table to your side. Bend your elbow about 90 at the elbow (right angle) and place your hand palm facing down on the table. Your elbow should be about 6 inches away from your side. 3. Keeping your arm on the table, lean your body forward. Abduction 1. Turn your chair so that your affected side is nearest to the table. 2. Place your forearm and hand on the table so that your thumb points toward the ceiling and your arm is straight out to your side. 3. Slide your hand out to the side and away from you, using your unaffected arm to do the work. 4. To increase the stretch, you can slide your chair away from the table. Flexion: forward stretch 1. Sit facing the table. Place your hand and elbow on the table in front of you. 2. Slide your hand forward and away from you, using your unaffected arm to do the work. 3. To increase the stretch, you can slide your chair backward. Phase 3 exercises Do these exercises 1-2  times per day or as told by your health care provider. Hold each stretch for 30 seconds, and repeat 3 times. Do the exercises with one or both arms as instructed by your health care provider. Cross-body stretch: posterior capsule stretch 1. Lift your arm straight out in front of you. 2. Bend your arm 90 at the elbow (right angle) so your forearm moves across your body. 3. Use your other arm to gently pull the elbow across your body, toward your other shoulder. Wall climbs 1. Stand with your affected arm extended out to the side with your hand resting on a door frame. 2. Slide your hand slowly up the door frame. 3. To increase the stretch, step through the door frame. Keep  your body upright and do not lean. Wand exercises You will need a cane, a piece of PVC pipe, or a sturdy wooden dowel for wand exercises. Flexion To do this exercise while standing: 1. Hold the wand with both of your hands, palms down. 2. Using the other arm to help, lift your arms up and over your head, if able. 3. Push upward with your other arm to gently increase the stretch. To do this exercise while lying down: 1. Lie on your back with your elbows resting on the floor and the wand in both your hands. Your hands will be palm down, or pointing toward your feet. 2. Lift your hands toward the ceiling, using your unaffected arm to help if needed. 3. Bring your arms overhead as able, using your unaffected arm to help if needed. Internal rotation 1. Stand while holding the wand behind you with both hands. Your unaffected arm should be extended above your head with the arm of the affected side extended behind you at the level of your waist. The wand should be pointing straight up and down as you hold it. 2. Slowly pull the wand up behind your back by straightening the elbow of your unaffected arm and bending the elbow of your affected arm. External rotation 1. Lie on your back with your affected upper arm supported on a small pillow or rolled towel. When you first do this exercise, keep your upper arm close to your body. Over time, bring your arm up to a 90 angle out to the side. 2. Hold the wand across your stomach and with both hands palm up. Your elbow on your affected side should be bent at a 90 angle. 3. Use your unaffected side to help push your forearm away from you and toward the floor. Keep your elbow on your affected side bent at a 90 angle. Contact a health care provider if you have:  New or increasing pain.  New numbness, tingling, weakness, or discoloration in your arm or hand. This information is not intended to replace advice given to you by your health care provider. Make sure  you discuss any questions you have with your health care provider. Document Revised: 06/05/2017 Document Reviewed: 06/05/2017 Elsevier Patient Education  2021 [employer].

## 2020-06-14 NOTE — Progress Notes (Signed)
Subjective  CC:  Chief Complaint  Patient presents with  . Fall    Pt says that he fell on the ice yesterday morning. He has taken tylenol, and has helped some.  . Shoulder Injury  . Tailbone Pain   Same day acute visit; PCP not available. New pt to me. Chart reviewed.   HPI: Joshua Hahn is a 75 y.o. male who presents to the office today to address the problems listed above in the chief complaint.  75 year old male with history of heart disease, hypertension, hyperlipidemia reports fall, date of injury June 13, 2020: He was exiting his home and slipped on ice down the 4 reckon concrete steps.  He is left foot slipped out from under him, he landed on his tailbone and right shoulder mostly.  He is able to get up unassisted.  No head trauma or other injuries noted.  He took it easy yesterday.  Since he has been sore in his tailbone left shoulder with some shoulder stiffness this morning.  He took Tylenol yesterday which was helpful.  His sleep is unaffected.  He has right shoulder stiffness and some decreased range of motion but no weakness.  He is ambulating well.  He is in pain and has not taken any pain medicines today.  Denies gross hematuria, abdominal pain, headache or lower extremity pain.  He is on aspirin and Plavix for his vascular disease.  He denies chest pain   Assessment  1. Fall down steps, initial encounter   2. Sacral contusion, initial encounter   3. Acute pain of right shoulder   4. Coronary artery disease involving native coronary artery of native heart with other form of angina pectoris ([place])   5. Essential hypertension      Plan   Fall with contusions right shoulder pain: Multiple contusions; doubt fractures but will get x-rays.  Left shoulder exam is abnormal.  He does have decreased abduction due to pain.  Strength is normal.  Recommend ice, Tylenol as needed and time.  If shoulder pain or stiffness persists, recommend return for reevaluation.  Mainly  rotator cuff evaluation.  Patient understands and agrees with care plan.  Vascular disease on Plavix and aspirin: No signs of internal bleeding.  He does have ecchymosis.  Reassured.  No hematomas.  Blood pressure is elevated, likely due to pain response.  Treat pain with Tylenol and follow-up with home blood pressure checks.  Fall was due to ice.  No indications of syncope or cardiac issues.  Follow up: As needed if shoulder pain persists Visit date not found  Orders Placed This Encounter  Procedures  . DG Shoulder Right  . DG Sacrum/Coccyx   No orders of the defined types were placed in this encounter.     I reviewed the patients updated PMH, FH, and SocHx.    Patient Active Problem List   Diagnosis Date Noted  . Knee effusion, left 03/23/2020  . History of adenomatous polyp of colon 12/10/2014  . 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 Chronic renal insufficiency, stage III (moderate) ([place]) 02/05/2013  . CAD - PCI 1980s, CABG X 4 1994, DES CFX 08/2008, stent 2014 nstemi 01/11/2013  . Old MI (myocardial infarction) 01/11/2013  . OSA on CPAP 01/11/2013  . ADENOCARCINOMA, PROSTATE, GLEASON GRADE 7 04/05/2009  . Hyperlipidemia 12/18/2006  . Essential hypertension 12/18/2006   Current Meds  Medication Sig  . aspirin 81 MG tablet Take 81 mg by mouth daily.  . cholecalciferol (VITAMIN D) 1000  UNITS tablet Take 1,000 Units by mouth daily.  . clopidogrel (PLAVIX) 75 MG tablet TAKE 1 TABLET BY MOUTH EVERY DAY  . Coenzyme Q10 (CO Q 10) 100 MG CAPS Take 300 mg by mouth every evening.  . diclofenac Sodium (VOLTAREN) 1 % GEL Apply 2 g topically 4 (four) times daily.  Marland Kitchen ezetimibe (ZETIA) 10 MG tablet Take 1 tablet (10 mg total) by mouth daily.  . isosorbide mononitrate (IMDUR) 60 MG 24 hr tablet Take 1 tablet (60 mg total) by mouth daily. NEED OV FOR FUTURE REFILL  . metoprolol tartrate (LOPRESSOR) 25 MG tablet TAKE 1 TABLET (25 MG TOTAL) BY MOUTH EVERY MORNING AND 0.5 TABLETS (12.5 MG TOTAL) EVERY  EVENING.  . nitroGLYCERIN (NITROSTAT) 0.4 MG SL tablet Place 1 tablet (0.4 mg total) under the tongue every 5 (five) minutes as needed for chest pain.  Marland Kitchen omega-3 acid ethyl esters (LOVAZA) 1 g capsule Take 1 capsule (1 g total) by mouth 2 (two) times daily. NEED OV.  . ranolazine (RANEXA) 500 MG 12 hr tablet Take 1 tablet (500 mg total) by mouth 2 (two) times daily.  . rosuvastatin (CRESTOR) 40 MG tablet TAKE 1 TABLET (40 MG TOTAL) BY MOUTH DAILY. KEEP JUNE APPOINTMENT  . valsartan (DIOVAN) 80 MG tablet Take 1 tablet (80 mg total) by mouth daily. To replace losartan 50mg   . vitamin B-12 (CYANOCOBALAMIN) 100 MCG tablet Take 100 mcg by mouth daily.     Allergies: Patient has No Known Allergies. Family History: Patient family history includes Coronary artery disease in his brother and brother; Coronary artery disease (age of onset: 62) in his brother; Heart attack in his maternal grandfather; Heart disease in his father; Hypertension in his brother, brother, brother, father, mother, paternal grandfather, sister, sister, and son; Lupus in his daughter; Pancreatic cancer in his father; Stroke in his mother; Throat cancer in his brother. Social History:  Patient  reports that he quit smoking about 47 years ago. His smoking use included cigarettes. He has never used smokeless tobacco. He reports that he does not drink alcohol and does not use drugs.  Review of Systems: Constitutional: Negative for fever malaise or anorexia Cardiovascular: negative for chest pain Respiratory: negative for SOB or persistent cough Gastrointestinal: negative for abdominal pain  Objective  Vitals: BP 140/82   Pulse (!) 56   Temp (!) 97.3 F (36.3 C) (Temporal)   Ht 5\' 10"  (1.778 m)   Wt 217 lb 6.4 oz (98.6 kg)   SpO2 97%   BMI 31.19 kg/m  General: no acute distress , A&Ox3, well dressed, moves easily, able to undress without assistance. Right shoulder: Mild tenderness over scapula and acromion, no AC joint  tenderness, no ecchymosis.  Abduction to 120 degrees limited due to pain.  Negative empty can testing, negative impingement.  Normal strength of right upper extremity Back: No spinal tenderness, some lumbar paravertebral tenderness without ecchymosis or abrasion.  Normal gait, negative straight leg raise bilaterally Upper sacrum with ecchymosis and tenderness present Small abrasion present     Commons side effects, risks, benefits, and alternatives for medications and treatment plan prescribed today were discussed, and the patient expressed understanding of the given instructions. Patient is instructed to call or message via MyChart if he/she has any questions or concerns regarding our treatment plan. No barriers to understanding were identified. We discussed Red Flag symptoms and signs in detail. Patient expressed understanding regarding what to do in case of urgent or emergency type symptoms.   Medication  list was reconciled, printed and provided to the patient in AVS. Patient instructions and summary information was reviewed with the patient as documented in the AVS. This note was prepared with assistance of Dragon voice recognition software. Occasional wrong-word or sound-a-like substitutions may have occurred due to the inherent limitations of voice recognition software  This visit occurred during the SARS-CoV-2 public health emergency.  Safety protocols were in place, including screening questions prior to the visit, additional usage of staff PPE, and extensive cleaning of exam room while observing appropriate contact time as indicated for disinfecting solutions.

## 2020-06-15 NOTE — Progress Notes (Signed)
Please call patient: I have reviewed his/her xray results. Fortunately, there are no fractures or acute injuries to his shoulder or tailbone. The shoulder does show significant arthritic changes: this is the likely reason for his abnormal exam; please follow up with Dr. Yong Channel as we discussed if the shoulder continues to be a problem. Thanks.

## 2020-06-16 DIAGNOSIS — H401121 Primary open-angle glaucoma, left eye, mild stage: Secondary | ICD-10-CM | POA: Diagnosis not present

## 2020-06-16 DIAGNOSIS — H25013 Cortical age-related cataract, bilateral: Secondary | ICD-10-CM | POA: Diagnosis not present

## 2020-06-16 DIAGNOSIS — H2513 Age-related nuclear cataract, bilateral: Secondary | ICD-10-CM | POA: Diagnosis not present

## 2020-06-16 DIAGNOSIS — H401112 Primary open-angle glaucoma, right eye, moderate stage: Secondary | ICD-10-CM | POA: Diagnosis not present

## 2020-07-11 ENCOUNTER — Other Ambulatory Visit: Payer: Self-pay

## 2020-07-11 MED ORDER — OMEGA-3-ACID ETHYL ESTERS 1 G PO CAPS: 1.0000 g | ORAL_CAPSULE | Freq: Two times a day (BID) | ORAL | 3 refills | Status: DC

## 2020-07-19 ENCOUNTER — Other Ambulatory Visit: Payer: Self-pay | Admitting: Cardiovascular Disease

## 2020-08-01 ENCOUNTER — Other Ambulatory Visit: Payer: Self-pay | Admitting: Cardiovascular Disease

## 2020-08-08 ENCOUNTER — Encounter: Payer: Self-pay | Admitting: Cardiovascular Disease

## 2020-08-08 ENCOUNTER — Ambulatory Visit (INDEPENDENT_AMBULATORY_CARE_PROVIDER_SITE_OTHER): Payer: Medicare Other | Admitting: Cardiovascular Disease

## 2020-08-08 ENCOUNTER — Other Ambulatory Visit: Payer: Self-pay

## 2020-08-08 DIAGNOSIS — E785 Hyperlipidemia, unspecified: Secondary | ICD-10-CM

## 2020-08-08 DIAGNOSIS — I25810 Atherosclerosis of coronary artery bypass graft(s) without angina pectoris: Secondary | ICD-10-CM | POA: Diagnosis not present

## 2020-08-08 DIAGNOSIS — N1832 Chronic kidney disease, stage 3b: Secondary | ICD-10-CM | POA: Diagnosis not present

## 2020-08-08 DIAGNOSIS — G4733 Obstructive sleep apnea (adult) (pediatric): Secondary | ICD-10-CM | POA: Diagnosis not present

## 2020-08-08 DIAGNOSIS — I10 Essential (primary) hypertension: Secondary | ICD-10-CM

## 2020-08-08 DIAGNOSIS — Z951 Presence of aortocoronary bypass graft: Secondary | ICD-10-CM | POA: Diagnosis not present

## 2020-08-08 NOTE — Patient Instructions (Signed)
Medication Instructions:  The current medical regimen is effective;  continue present plan and medications.  *If you need a refill on your cardiac medications before your next appointment, please call your pharmacy*   Follow-Up: At [facility], you and your health needs are our priority.  As part of our continuing mission to provide you with exceptional heart care, we have created designated Provider Care Teams.  These Care Teams include your primary Cardiologist (physician) and Advanced Practice Providers (APPs -  Physician Assistants and Nurse Practitioners) who all work together to provide you with the care you need, when you need it.  We recommend signing up for the patient portal called "MyChart".  Sign up information is provided on this After Visit Summary.  MyChart is used to connect with patients for Virtual Visits (Telemedicine).  Patients are able to view lab/test results, encounter notes, upcoming appointments, etc.  Non-urgent messages can be sent to your provider as well.   To learn more about what you can do with MyChart, go to [email or web address].    Your next appointment:   2-3 month(s)  The format for your next appointment:   In Person  Provider:   Shelva Majestic, MD
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
## 2020-08-10 ENCOUNTER — Encounter: Payer: Self-pay | Admitting: Cardiovascular Disease

## 2020-08-26 DIAGNOSIS — Z20822 Contact with and (suspected) exposure to covid-19: Secondary | ICD-10-CM | POA: Diagnosis not present

## 2020-09-29 ENCOUNTER — Emergency Department (HOSPITAL_BASED_OUTPATIENT_CLINIC_OR_DEPARTMENT_OTHER)
Admission: EM | Admit: 2020-09-29 | Discharge: 2020-09-29 | Disposition: A | Discharge status: Home / Self Care | Payer: Medicare Other | Attending: Emergency Medicine | Admitting: Emergency Medicine

## 2020-09-29 ENCOUNTER — Encounter (HOSPITAL_BASED_OUTPATIENT_CLINIC_OR_DEPARTMENT_OTHER): Payer: Self-pay | Admitting: *Deleted

## 2020-09-29 ENCOUNTER — Other Ambulatory Visit: Payer: Self-pay

## 2020-09-29 ENCOUNTER — Emergency Department (HOSPITAL_BASED_OUTPATIENT_CLINIC_OR_DEPARTMENT_OTHER): Payer: Medicare Other

## 2020-09-29 DIAGNOSIS — Z7902 Long term (current) use of antithrombotics/antiplatelets: Secondary | ICD-10-CM | POA: Insufficient documentation

## 2020-09-29 DIAGNOSIS — R509 Fever, unspecified: Secondary | ICD-10-CM | POA: Diagnosis not present

## 2020-09-29 DIAGNOSIS — R059 Cough, unspecified: Secondary | ICD-10-CM | POA: Diagnosis not present

## 2020-09-29 DIAGNOSIS — Z951 Presence of aortocoronary bypass graft: Secondary | ICD-10-CM | POA: Insufficient documentation

## 2020-09-29 DIAGNOSIS — Z8546 Personal history of malignant neoplasm of prostate: Secondary | ICD-10-CM | POA: Insufficient documentation

## 2020-09-29 DIAGNOSIS — U071 COVID-19: Secondary | ICD-10-CM | POA: Insufficient documentation

## 2020-09-29 DIAGNOSIS — N183 Chronic kidney disease, stage 3 unspecified: Secondary | ICD-10-CM | POA: Diagnosis not present

## 2020-09-29 DIAGNOSIS — I2510 Atherosclerotic heart disease of native coronary artery without angina pectoris: Secondary | ICD-10-CM | POA: Diagnosis not present

## 2020-09-29 DIAGNOSIS — Z87891 Personal history of nicotine dependence: Secondary | ICD-10-CM | POA: Diagnosis not present

## 2020-09-29 DIAGNOSIS — Z79899 Other long term (current) drug therapy: Secondary | ICD-10-CM | POA: Insufficient documentation

## 2020-09-29 DIAGNOSIS — E860 Dehydration: Secondary | ICD-10-CM | POA: Insufficient documentation

## 2020-09-29 DIAGNOSIS — Z7982 Long term (current) use of aspirin: Secondary | ICD-10-CM | POA: Diagnosis not present

## 2020-09-29 DIAGNOSIS — I129 Hypertensive chronic kidney disease with stage 1 through stage 4 chronic kidney disease, or unspecified chronic kidney disease: Secondary | ICD-10-CM | POA: Insufficient documentation

## 2020-09-29 DIAGNOSIS — R21 Rash and other nonspecific skin eruption: Secondary | ICD-10-CM | POA: Diagnosis not present

## 2020-09-29 DIAGNOSIS — R519 Headache, unspecified: Secondary | ICD-10-CM | POA: Diagnosis present

## 2020-09-29 DIAGNOSIS — J9811 Atelectasis: Secondary | ICD-10-CM | POA: Diagnosis not present

## 2020-09-29 LAB — CBC WITH DIFFERENTIAL/PLATELET
Abs Immature Granulocytes: 0.02 10*3/uL (ref 0.00–0.07)
Basophils Absolute: 0.1 10*3/uL (ref 0.0–0.1)
Basophils Relative: 1 %
Eosinophils Absolute: 0.1 10*3/uL (ref 0.0–0.5)
Eosinophils Relative: 1 %
HCT: 43.1 % (ref 39.0–52.0)
Hemoglobin: 14 g/dL (ref 13.0–17.0)
Immature Granulocytes: 0 %
Lymphocytes Relative: 19 %
Lymphs Abs: 1.4 10*3/uL (ref 0.7–4.0)
MCH: 29.9 pg (ref 26.0–34.0)
MCHC: 32.5 g/dL (ref 30.0–36.0)
MCV: 92.1 fL (ref 80.0–100.0)
Monocytes Absolute: 1.3 10*3/uL — ABNORMAL HIGH (ref 0.1–1.0)
Monocytes Relative: 18 %
Neutro Abs: 4.4 10*3/uL (ref 1.7–7.7)
Neutrophils Relative %: 61 %
Platelets: 169 10*3/uL (ref 150–400)
RBC: 4.68 MIL/uL (ref 4.22–5.81)
RDW: 12.3 % (ref 11.5–15.5)
WBC: 7.2 10*3/uL (ref 4.0–10.5)
nRBC: 0 % (ref 0.0–0.2)

## 2020-09-29 LAB — BASIC METABOLIC PANEL
Anion gap: 7 (ref 5–15)
BUN: 17 mg/dL (ref 8–23)
CO2: 25 mmol/L (ref 22–32)
Calcium: 9 mg/dL (ref 8.9–10.3)
Chloride: 101 mmol/L (ref 98–111)
Creatinine, Ser: 1.96 mg/dL — ABNORMAL HIGH (ref 0.61–1.24)
GFR, Estimated: 35 mL/min — ABNORMAL LOW (ref >60)
Glucose, Bld: 117 mg/dL — ABNORMAL HIGH (ref 70–99)
Potassium: 4.4 mmol/L (ref 3.5–5.1)
Sodium: 133 mmol/L — ABNORMAL LOW (ref 135–145)

## 2020-09-29 LAB — RESP PANEL BY RT-PCR (FLU A&B, COVID) ARPGX2
Influenza A by PCR: NEGATIVE
Influenza B by PCR: NEGATIVE
SARS Coronavirus 2 by RT PCR: POSITIVE — AB

## 2020-09-29 MED ORDER — ONDANSETRON HCL 4 MG/2ML IJ SOLN
4.0000 mg | Freq: Once | INTRAMUSCULAR | Status: AC
  Administered 2020-09-29: 4 mg via INTRAVENOUS
  Filled 2020-09-29: qty 2

## 2020-09-29 MED ORDER — MOLNUPIRAVIR EUA 200MG CAPSULE: 4.0000 | ORAL_CAPSULE | Freq: Two times a day (BID) | ORAL | 0 refills | Status: AC

## 2020-09-29 MED ORDER — ALBUTEROL SULFATE HFA 108 (90 BASE) MCG/ACT IN AERS
2.0000 | INHALATION_SPRAY | Freq: Once | RESPIRATORY_TRACT | Status: AC
  Administered 2020-09-29: 2 via RESPIRATORY_TRACT
  Filled 2020-09-29: qty 6.7

## 2020-09-29 MED ORDER — ONDANSETRON 4 MG PO TBDP: ORAL_TABLET | ORAL | 0 refills | Status: DC

## 2020-09-29 MED ORDER — ACETAMINOPHEN 325 MG PO TABS
650.0000 mg | ORAL_TABLET | Freq: Once | ORAL | Status: AC
  Administered 2020-09-29: 650 mg via ORAL
  Filled 2020-09-29: qty 2

## 2020-09-29 NOTE — ED Notes (Signed)
ED Provider at bedside. 

## 2020-09-29 NOTE — ED Provider Notes (Signed)
[facility] EMERGENCY DEPARTMENT Provider Note   CSN: [id number] Arrival date & time: 09/29/20  1803     History Chief Complaint  Patient presents with  . URI    Joshua Hahn is a 75 y.o. male hx of CKD, CAD, MI here presenting with cough and chills and headaches.  Patient was at a funeral yesterday.  He states that he was at a crowded room but he was wearing a mask.  Since yesterday he has been having chills and headaches and nausea.  He also has nonproductive cough as well.  Patient wants to get checked out for COVID.  Patient states that he did get his COVID vaccines and he has a booster.  The history is provided by the patient.       Past Medical History:  Diagnosis Date  . Chronic kidney disease (CKD), stage III (moderate) (HCC)   . Coronary artery disease 1994   PCI'80s, CABG '94, CFX DES 4/10  . Diverticulosis   . HTN (hypertension) 10/16/2011   Echo -EF 50-55% ,LV NORMAL  . 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 levels LDL IIIa+b% and HDL2b%  . Hypertension   . Myocardial infarct ([place])   . Myocardial infarction [facility]) 1994   inferior wall MI at [facility] hospitaland CABG  . Prostate cancer ([place])   . Sleep apnea    uses CPAP PRN  . Sleep apnea, obstructive    using C-PAP    Patient Active Problem List   Diagnosis Date Noted  . Knee effusion, left 03/23/2020  . History of adenomatous polyp of colon 12/10/2014  . 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 Chronic renal insufficiency, stage III (moderate) ([place]) 02/05/2013  . CAD - PCI 1980s, CABG X 4 1994, DES CFX 08/2008, stent 2014 nstemi 01/11/2013  . Old MI (myocardial infarction) 01/11/2013  . OSA on CPAP 01/11/2013  . ADENOCARCINOMA, PROSTATE, GLEASON GRADE 7 04/05/2009  . Hyperlipidemia 12/18/2006  . Essential hypertension 12/18/2006    Past Surgical History:  Procedure Laterality Date  . ABDOMINAL SURGERY    . CARDIAC  CATHETERIZATION  06/23/2008   occlusion sequential OM1 andOM2 graft as well vein graft to RCA.LAD and diagonal system remain intact. RCA  collateralized  . CORONARY ANGIOPLASTY WITH STENT PLACEMENT    . CORONARY ARTERY BYPASS GRAFT  1994  . EXPLORATORY LAPAROTOMY     ideopathic retroperitoneal fibrosis  . LEFT HEART CATHETERIZATION WITH CORONARY/GRAFT ANGIOGRAM N/A 02/04/2013   Procedure: LEFT HEART CATHETERIZATION WITH Beatrix Fetters;  Surgeon: [name], MD;  Location: [facility] CATH LAB;  Service: Cardiovascular;  Laterality: N/A;  . LEFT PAROTID GLAND SURGRY  27/7824   Dr. Erik Obey  . PERCUTANEOUS CORONARY STENT INTERVENTION (PCI-S)  06/25/2008   rotational atherectomy w/diffuse disease native circ. w/drug eluting stents , PTCA on one vessel; grat occlusion to RCA w/native RCA proxim. occlusion. Circ.supplied collaaterals distal RCA  following intervention  . PERCUTANEOUS CORONARY STENT INTERVENTION (PCI-S) N/A 02/05/2013   Procedure: PERCUTANEOUS CORONARY STENT INTERVENTION (PCI-S);  Surgeon: [name], MD;  Location: [facility] CATH LAB;  Service: Cardiovascular;  Laterality: N/A;  . PROSTATECTOMY  06/2009   robotic-asssisted laparoscopic radical by Dr Rosana Hoes       Family History  Problem Relation Age of Onset  . Pancreatic cancer Father   . Heart disease Father   . Hypertension Father   . Stroke Mother   . Hypertension Mother   . Coronary artery disease  Brother 23       2 bros with early CAD  . Hypertension Brother   . Coronary artery disease Brother   . Hypertension Brother   . Coronary artery disease Brother   . Hypertension Brother   . Heart attack Maternal Grandfather   . Hypertension Paternal Grandfather   . Lupus Daughter   . Hypertension Son   . Hypertension Sister   . Hypertension Sister   . Throat cancer Brother   . Colon cancer Neg Hx   . Esophageal cancer Neg Hx   . Stomach cancer Neg Hx   . Rectal cancer Neg Hx     Social History   Tobacco Use  .  Smoking status: Former Smoker    Types: Cigarettes    Quit date: 05/07/1973    Years since quitting: 47.4  . Smokeless tobacco: Never Used  Vaping Use  . Vaping Use: Never used  Substance Use Topics  . Alcohol use: No  . Drug use: No    Home Medications Prior to Admission medications   Medication Sig Start Date End Date Taking? Authorizing Provider  aspirin 81 MG tablet Take 81 mg by mouth daily.    [provider]  cholecalciferol (VITAMIN D) 1000 UNITS tablet Take 1,000 Units by mouth daily.    [provider]  clopidogrel (PLAVIX) 75 MG tablet TAKE 1 TABLET BY MOUTH EVERY DAY 08/01/20   [name], MD  Coenzyme Q10 (CO Q 10) 100 MG CAPS Take 300 mg by mouth every evening.    [provider]  ezetimibe (ZETIA) 10 MG tablet Take 1 tablet (10 mg total) by mouth daily. 05/03/20 08/01/20  [name], MD  isosorbide mononitrate (IMDUR) 60 MG 24 hr tablet Take 1 tablet (60 mg total) by mouth daily. NEED OV FOR FUTURE REFILL 09/01/19   [name], MD  metoprolol tartrate (LOPRESSOR) 25 MG tablet TAKE 1 TABLET (25 MG TOTAL) BY MOUTH EVERY MORNING AND 0.5 TABLETS (12.5 MG TOTAL) EVERY EVENING. 07/19/20   [name], MD  nitroGLYCERIN (NITROSTAT) 0.4 MG SL tablet Place 1 tablet (0.4 mg total) under the tongue every 5 (five) minutes as needed for chest pain. 10/22/16   [name], MD  omega-3 acid ethyl esters (LOVAZA) 1 g capsule Take 1 capsule (1 g total) by mouth 2 (two) times daily. 07/11/20   [name], MD  ranolazine (RANEXA) 500 MG 12 hr tablet Take 1 tablet (500 mg total) by mouth 2 (two) times daily. 05/16/20   [name], MD  rosuvastatin (CRESTOR) 40 MG tablet Take 1 tablet (40 mg total) by mouth daily. 08/01/20   [name], MD  valsartan (DIOVAN) 80 MG tablet Take 1 tablet (80 mg total) by mouth daily. To replace losartan 50mg  04/13/20   [name], MD  vitamin B-12 (CYANOCOBALAMIN) 100 MCG tablet Take 100 mcg by mouth daily.      [provider]    Allergies    Patient has no known allergies.  Review of Systems   Review of Systems  Constitutional: Positive for fever.  Respiratory: Positive for cough.   All other systems reviewed and are negative.   Physical Exam Updated Vital Signs BP (!) 145/79 (BP Location: Left Arm)   Pulse 73   Temp 100 F (37.8 C) (Oral)   Resp 20   Ht 5\' 11"  (1.803 m)   Wt 95.3 kg   SpO2 96%   BMI 29.29  kg/m   Physical Exam Vitals and nursing note reviewed.  Constitutional:      Comments: Slightly dehydrated  HENT:     Head: Normocephalic.     Nose: Nose normal.     Mouth/Throat:     Mouth: Mucous membranes are dry.  Eyes:     Extraocular Movements: Extraocular movements intact.     Pupils: Pupils are equal, round, and reactive to light.  Cardiovascular:     Rate and Rhythm: Normal rate and regular rhythm.     Pulses: Normal pulses.     Heart sounds: Normal heart sounds.  Pulmonary:     Comments: Diminished bilaterally and minimal wheezing Abdominal:     General: Abdomen is flat.     Palpations: Abdomen is soft.  Musculoskeletal:        General: Normal range of motion.     Cervical back: Normal range of motion and neck supple.  Skin:    General: Skin is warm.     Capillary Refill: Capillary refill takes less than 2 seconds.  Neurological:     General: No focal deficit present.     Mental Status: He is oriented to person, place, and time.  Psychiatric:        Mood and Affect: Mood normal.        Behavior: Behavior normal.     ED Results / Procedures / Treatments   Labs (all labs ordered are listed, but only abnormal results are displayed) Labs Reviewed  RESP PANEL BY RT-PCR (FLU A&B, COVID) ARPGX2 - Abnormal; Notable for the following components:      Result Value   SARS Coronavirus 2 by RT PCR POSITIVE (*)    All other components within normal limits  CBC WITH DIFFERENTIAL/PLATELET - Abnormal; Notable for the following components:    Monocytes Absolute 1.3 (*)    All other components within normal limits  BASIC METABOLIC PANEL - Abnormal; Notable for the following components:   Sodium 133 (*)    Glucose, Bld 117 (*)    Creatinine, Ser 1.96 (*)    GFR, Estimated 35 (*)    All other components within normal limits    EKG None  Radiology DG Chest Port 1 View  Result Date: 09/29/2020 CLINICAL DATA:  75 year old male with cough and fever. EXAM: PORTABLE CHEST 1 VIEW COMPARISON:  Chest radiograph dated 06/12/2016. FINDINGS: No focal consolidation, pleural effusion, or pneumothorax. There is shallow inspiration with minimal bibasilar atelectasis. Stable cardiac silhouette. Median sternotomy wires and CABG vascular clips. Atherosclerotic calcification of the aorta. No acute osseous pathology. IMPRESSION: No acute cardiopulmonary process. Electronically Signed   By: Anner Crete M.D.   On: 09/29/2020 18:40    Procedures Procedures   Medications Ordered in ED Medications  albuterol (VENTOLIN HFA) 108 (90 Base) MCG/ACT inhaler 2 puff (2 puffs Inhalation Given 09/29/20 1841)  acetaminophen (TYLENOL) tablet 650 mg (650 mg Oral Given 09/29/20 1850)  ondansetron (ZOFRAN) injection 4 mg (4 mg Intravenous Given 09/29/20 1852)    ED Course  I have reviewed the triage vital signs and the nursing notes.  Pertinent labs & imaging results that were available during my care of the patient were reviewed by me and considered in my medical decision making (see chart for details).    MDM Rules/Calculators/A&P                         Joshua Hahn is a 75 y.o. male here presenting  with a cough and chills and possible COVID exposure at a funeral yesterday.  Patient is fully vaccinated.  He has minimal wheezing but not hypoxic.  Vitals otherwise stable.  Plan to get chest x-ray and basic blood work and COVID test.  Will give albuterol as well  7:48 PM Labs showed baseline Cr of 1.9. CXR clear. COVID test is positive. No oxygen  requirement. Stable for dc. He qualifies for Paxlovid   Joshua Hahn was evaluated in Emergency Department on 09/29/2020 for the symptoms described in the history of present illness. He was evaluated in the context of the global COVID-19 pandemic, which necessitated consideration that the patient might be at risk for infection with the SARS-CoV-2 virus that causes COVID-19. Institutional protocols and algorithms that pertain to the evaluation of patients at risk for COVID-19 are in a state of rapid change based on information released by regulatory bodies including the CDC and federal and state organizations. These policies and algorithms were followed during the patient's care in the ED.  Final Clinical Impression(s) / ED Diagnoses Final diagnoses:  None    Rx / DC Orders ED Discharge Orders    None       Drenda Freeze, MD 09/29/20 1949

## 2020-09-29 NOTE — ED Notes (Signed)
Pt. Reports yesterday he started to feel flu like symptoms and today only getting worse.  Pt. Has a slight fever with a cough.

## 2020-09-29 NOTE — ED Triage Notes (Signed)
Chills, sob, headache, cough since yesterday. He has not been tested for Covid. States he was around a crowd of people at a funeral.

## 2020-09-29 NOTE — Discharge Instructions (Signed)
Take Zofran for nausea.  Take Tylenol for fever  Take molnupiravir as prescribed.  See your doctor for follow-up.  You have COVID and need to stay home at least for 5 days  Return to ER if you have worsening shortness of breath, fever, cough     Person Under Monitoring Name: Joshua Hahn  Location: [place] [address]   Infection Prevention Recommendations for Individuals Confirmed to have, or Being Evaluated for, 2019 Novel Coronavirus (COVID-19) Infection Who Receive Care at Home  Individuals who are confirmed to have, or are being evaluated for, COVID-19 should follow the prevention steps below until a healthcare provider or local or state health department says they can return to normal activities.  Stay home except to get medical care You should restrict activities outside your home, except for getting medical care. Do not go to work, school, or public areas, and do not use public transportation or taxis.  Call ahead before visiting your doctor Before your medical appointment, call the healthcare provider and tell them that you have, or are being evaluated for, COVID-19 infection. This will help the healthcare provider's office take steps to keep other people from getting infected. Ask your healthcare provider to call the local or state health department.  Monitor your symptoms Seek prompt medical attention if your illness is worsening (e.g., difficulty breathing). Before going to your medical appointment, call the healthcare provider and tell them that you have, or are being evaluated for, COVID-19 infection. Ask your healthcare provider to call the local or state health department.  Wear a facemask You should wear a facemask that covers your nose and mouth when you are in the same room with other people and when you visit a healthcare provider. People who live with or visit you should also wear a facemask while they are in the same room  with you.  Separate yourself from other people in your home As much as possible, you should stay in a different room from other people in your home. Also, you should use a separate bathroom, if available.  Avoid sharing household items You should not share dishes, drinking glasses, cups, eating utensils, towels, bedding, or other items with other people in your home. After using these items, you should wash them thoroughly with soap and water.  Cover your coughs and sneezes Cover your mouth and nose with a tissue when you cough or sneeze, or you can cough or sneeze into your sleeve. Throw used tissues in a lined trash can, and immediately wash your hands with soap and water for at least 20 seconds or use an alcohol-based hand rub.  Wash your [facility] your hands often and thoroughly with soap and water for at least 20 seconds. You can use an alcohol-based hand sanitizer if soap and water are not available and if your hands are not visibly dirty. Avoid touching your eyes, nose, and mouth with unwashed hands.   Prevention Steps for Caregivers and Household Members of Individuals Confirmed to have, or Being Evaluated for, COVID-19 Infection Being Cared for in the Home  If you live with, or provide care at home for, a person confirmed to have, or being evaluated for, COVID-19 infection please follow these guidelines to prevent infection:  Follow healthcare provider's instructions Make sure that you understand and can help the patient follow any healthcare provider instructions for all care.  Provide for the patient's basic needs You should help the patient with basic needs in  the home and provide support for getting groceries, prescriptions, and other personal needs.  Monitor the patient's symptoms If they are getting sicker, call his or her medical provider and tell them that the patient has, or is being evaluated for, COVID-19 infection. This will help the healthcare provider's  office take steps to keep other people from getting infected. Ask the healthcare provider to call the local or state health department.  Limit the number of people who have contact with the patient If possible, have only one caregiver for the patient. Other household members should stay in another home or place of residence. If this is not possible, they should stay in another room, or be separated from the patient as much as possible. Use a separate bathroom, if available. Restrict visitors who do not have an essential need to be in the home.  Keep older adults, very young children, and other sick people away from the patient Keep older adults, very young children, and those who have compromised immune systems or chronic health conditions away from the patient. This includes people with chronic heart, lung, or kidney conditions, diabetes, and cancer.  Ensure good ventilation Make sure that shared spaces in the home have good air flow, such as from an air conditioner or an opened window, weather permitting.  Wash your hands often Wash your hands often and thoroughly with soap and water for at least 20 seconds. You can use an alcohol based hand sanitizer if soap and water are not available and if your hands are not visibly dirty. Avoid touching your eyes, nose, and mouth with unwashed hands. Use disposable paper towels to dry your hands. If not available, use dedicated cloth towels and replace them when they become wet.  Wear a facemask and gloves Wear a disposable facemask at all times in the room and gloves when you touch or have contact with the patient's blood, body fluids, and/or secretions or excretions, such as sweat, saliva, sputum, nasal mucus, vomit, urine, or feces.  Ensure the mask fits over your nose and mouth tightly, and do not touch it during use. Throw out disposable facemasks and gloves after using them. Do not reuse. Wash your hands immediately after removing your facemask  and gloves. If your personal clothing becomes contaminated, carefully remove clothing and launder. Wash your hands after handling contaminated clothing. Place all used disposable facemasks, gloves, and other waste in a lined container before disposing them with other household waste. Remove gloves and wash your hands immediately after handling these items.  Do not share dishes, glasses, or other household items with the patient Avoid sharing household items. You should not share dishes, drinking glasses, cups, eating utensils, towels, bedding, or other items with a patient who is confirmed to have, or being evaluated for, COVID-19 infection. After the person uses these items, you should wash them thoroughly with soap and water.  Wash laundry thoroughly Immediately remove and wash clothes or bedding that have blood, body fluids, and/or secretions or excretions, such as sweat, saliva, sputum, nasal mucus, vomit, urine, or feces, on them. Wear gloves when handling laundry from the patient. Read and follow directions on labels of laundry or clothing items and detergent. In general, wash and dry with the warmest temperatures recommended on the label.  Clean all areas the individual has used often Clean all touchable surfaces, such as counters, tabletops, doorknobs, bathroom fixtures, toilets, phones, keyboards, tablets, and bedside tables, every day. Also, clean any surfaces that may have blood, body fluids,  and/or secretions or excretions on them. Wear gloves when cleaning surfaces the patient has come in contact with. Use a diluted bleach solution (e.g., dilute bleach with 1 part bleach and 10 parts water) or a household disinfectant with a label that says EPA-registered for coronaviruses. To make a bleach solution at home, add 1 tablespoon of bleach to 1 quart (4 cups) of water. For a larger supply, add  cup of bleach to 1 gallon (16 cups) of water. Read labels of cleaning products and follow  recommendations provided on product labels. Labels contain instructions for safe and effective use of the cleaning product including precautions you should take when applying the product, such as wearing gloves or eye protection and making sure you have good ventilation during use of the product. Remove gloves and wash hands immediately after cleaning.  Monitor yourself for signs and symptoms of illness Caregivers and household members are considered close contacts, should monitor their health, and will be asked to limit movement outside of the home to the extent possible. Follow the monitoring steps for close contacts listed on the symptom monitoring form.   ? If you have additional questions, contact your local health department or call the epidemiologist on call at [phone] (available 24/7). ? This guidance is subject to change. For the most up-to-date guidance from [facility], please refer to their website: YouBlogs.pl

## 2020-09-30 ENCOUNTER — Telehealth: Payer: Self-pay | Admitting: Cardiovascular Disease

## 2020-09-30 NOTE — Telephone Encounter (Signed)
Called patient, he advised that he uses CPAP and is unable to use it while he has COVID due to the cough and congestion. He states that once he is better he will restart he just knows he is in his window of making sure he is using it. I did recommend that he contact [facility] and make them aware and I would notify Dr.Kelly as well and make sure he had no other suggestions.   Patient verbalized understanding, thankful for call back.

## 2020-09-30 NOTE — Telephone Encounter (Signed)
New message:      Patient calling stating that he has a CPAP and now he has covid and he not sure what he should do. Please advise

## 2020-10-06 ENCOUNTER — Encounter: Payer: Self-pay | Admitting: Family Medicine

## 2020-10-13 ENCOUNTER — Telehealth: Payer: Self-pay | Admitting: Family Medicine

## 2020-10-13 NOTE — Chronic Care Management (AMB) (Signed)
  Chronic Care Management   Note  10/13/2020 Name: JERONE CUDMORE MRN: [id number] DOB: [birth year]  OLUWANIFEMI SUSMAN is a 75 y.o. year old male who is a primary care patient of Marin Olp, MD. I reached out to [name] by phone today in response to a referral sent by Mr. Cottrell Gentles Blower's PCP, Marin Olp, MD.   Mr. Stoke was given information about Chronic Care Management services today including:  CCM service includes personalized support from designated clinical staff supervised by his physician, including individualized plan of care and coordination with other care providers 24/7 contact phone numbers for assistance for urgent and routine care needs. Service will only be billed when office clinical staff spend 20 minutes or more in a month to coordinate care. Only one practitioner may furnish and bill the service in a calendar month. The patient may stop CCM services at any time (effective at the end of the month) by phone call to the office staff.   Patient agreed to services and verbal consent obtained.   Follow up plan:   Lauretta Grill [employer] Scheduler

## 2020-10-31 NOTE — Telephone Encounter (Signed)
agree

## 2020-11-08 DIAGNOSIS — Z20822 Contact with and (suspected) exposure to covid-19: Secondary | ICD-10-CM | POA: Diagnosis not present

## 2020-11-15 ENCOUNTER — Telehealth: Payer: Self-pay

## 2020-11-15 NOTE — Chronic Care Management (AMB) (Signed)
    Chronic Care Management Pharmacy Assistant   Name: SHAMEEK NYQUIST  MRN: [id number] DOB: 1945-09-04  Reason for Encounter: Reschedule Appointment   Patient rescheduled his initial telephone appointment with Madelin Rear, CPP for Monday August 29th at 11:00 am.  April Daiva Huge, [place] Pharmacist Assistant [phone]

## 2020-11-21 ENCOUNTER — Other Ambulatory Visit: Payer: Self-pay | Admitting: Cardiovascular Disease

## 2020-11-21 ENCOUNTER — Ambulatory Visit: Payer: Medicare Other

## 2020-11-28 ENCOUNTER — Encounter: Payer: Self-pay | Admitting: Cardiovascular Disease

## 2020-11-28 ENCOUNTER — Ambulatory Visit (INDEPENDENT_AMBULATORY_CARE_PROVIDER_SITE_OTHER): Payer: Medicare Other | Admitting: Cardiovascular Disease

## 2020-11-28 ENCOUNTER — Other Ambulatory Visit: Payer: Self-pay

## 2020-11-28 VITALS — BP 160/80 | Ht 70.0 in | Wt 200.0 lb

## 2020-11-28 DIAGNOSIS — Z951 Presence of aortocoronary bypass graft: Secondary | ICD-10-CM | POA: Diagnosis not present

## 2020-11-28 DIAGNOSIS — G4733 Obstructive sleep apnea (adult) (pediatric): Secondary | ICD-10-CM

## 2020-11-28 DIAGNOSIS — I10 Essential (primary) hypertension: Secondary | ICD-10-CM

## 2020-11-28 DIAGNOSIS — I25810 Atherosclerosis of coronary artery bypass graft(s) without angina pectoris: Secondary | ICD-10-CM

## 2020-11-28 DIAGNOSIS — Z9989 Dependence on other enabling machines and devices: Secondary | ICD-10-CM

## 2020-11-28 DIAGNOSIS — N1832 Chronic kidney disease, stage 3b: Secondary | ICD-10-CM

## 2020-11-28 DIAGNOSIS — E785 Hyperlipidemia, unspecified: Secondary | ICD-10-CM

## 2020-11-28 MED ORDER — AMLODIPINE BESYLATE 5 MG PO TABS: 5.0000 mg | ORAL_TABLET | Freq: Every day | ORAL | 3 refills | Status: DC
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
## 2020-11-28 NOTE — Patient Instructions (Signed)
Medication Instructions:   BEGIN amlodipine '5mg'$  (1 tablet) daily.  *If you need a refill on your cardiac medications before your next appointment, please call your pharmacy*   Lab Work: CMET, CBC, TSH, Lipid to be drawn tomorrow.   If you have labs (blood work) drawn today and your tests are completely normal, you will receive your results only by: [place] (if you have MyChart) OR A paper copy in the mail If you have any lab test that is abnormal or we need to change your treatment, we will call you to review the results.   Testing/Procedures: None ordered.    Follow-Up: At [facility], you and your health needs are our priority.  As part of our continuing mission to provide you with exceptional heart care, we have created designated Provider Care Teams.  These Care Teams include your primary Cardiologist (physician) and Advanced Practice Providers (APPs -  Physician Assistants and Nurse Practitioners) who all work together to provide you with the care you need, when you need it.  We recommend signing up for the patient portal called "MyChart".  Sign up information is provided on this After Visit Summary.  MyChart is used to connect with patients for Virtual Visits (Telemedicine).  Patients are able to view lab/test results, encounter notes, upcoming appointments, etc.  Non-urgent messages can be sent to your provider as well.   To learn more about what you can do with MyChart, go to [email or web address].    Your next appointment:   3 month(s)  The format for your next appointment:   In Person  Provider:   Shelva Majestic, MD

## 2020-11-29 DIAGNOSIS — E785 Hyperlipidemia, unspecified: Secondary | ICD-10-CM | POA: Diagnosis not present

## 2020-11-29 DIAGNOSIS — I10 Essential (primary) hypertension: Secondary | ICD-10-CM | POA: Diagnosis not present

## 2020-11-29 LAB — CBC
Hematocrit: 43.5 % (ref 37.5–51.0)
Hemoglobin: 14.3 g/dL (ref 13.0–17.7)
MCH: 29.2 pg (ref 26.6–33.0)
MCHC: 32.9 g/dL (ref 31.5–35.7)
MCV: 89 fL (ref 79–97)
Platelets: 179 10*3/uL (ref 150–450)
RBC: 4.89 x10E6/uL (ref 4.14–5.80)
RDW: 11.8 % (ref 11.6–15.4)
WBC: 6.1 10*3/uL (ref 3.4–10.8)

## 2020-11-29 LAB — COMPREHENSIVE METABOLIC PANEL
ALT: 23 IU/L (ref 0–44)
AST: 36 IU/L (ref 0–40)
Albumin/Globulin Ratio: 1.5 (ref 1.2–2.2)
Albumin: 4 g/dL (ref 3.7–4.7)
Alkaline Phosphatase: 74 IU/L (ref 44–121)
BUN/Creatinine Ratio: 8 — ABNORMAL LOW (ref 10–24)
BUN: 15 mg/dL (ref 8–27)
Bilirubin Total: 0.6 mg/dL (ref 0.0–1.2)
CO2: 24 mmol/L (ref 20–29)
Calcium: 9.5 mg/dL (ref 8.6–10.2)
Chloride: 103 mmol/L (ref 96–106)
Creatinine, Ser: 1.9 mg/dL — ABNORMAL HIGH (ref 0.76–1.27)
Globulin, Total: 2.7 g/dL (ref 1.5–4.5)
Glucose: 110 mg/dL — ABNORMAL HIGH (ref 65–99)
Potassium: 5 mmol/L (ref 3.5–5.2)
Sodium: 139 mmol/L (ref 134–144)
Total Protein: 6.7 g/dL (ref 6.0–8.5)
eGFR: 36 mL/min/{1.73_m2} — ABNORMAL LOW (ref >59)

## 2020-11-29 LAB — LIPID PANEL
Chol/HDL Ratio: 2.9 ratio (ref 0.0–5.0)
Cholesterol, Total: 113 mg/dL (ref 100–199)
HDL: 39 mg/dL — ABNORMAL LOW (ref >39)
LDL Chol Calc (NIH): 54 mg/dL (ref 0–99)
Triglycerides: 107 mg/dL (ref 0–149)
VLDL Cholesterol Cal: 20 mg/dL (ref 5–40)

## 2020-11-29 LAB — TSH: TSH: 2.28 u[IU]/mL (ref 0.450–4.500)

## 2020-11-30 DIAGNOSIS — Z23 Encounter for immunization: Secondary | ICD-10-CM | POA: Diagnosis not present

## 2020-12-12 DIAGNOSIS — H401121 Primary open-angle glaucoma, left eye, mild stage: Secondary | ICD-10-CM | POA: Diagnosis not present

## 2020-12-12 DIAGNOSIS — H401112 Primary open-angle glaucoma, right eye, moderate stage: Secondary | ICD-10-CM | POA: Diagnosis not present

## 2020-12-22 ENCOUNTER — Telehealth: Payer: Self-pay | Admitting: Pharmacist

## 2020-12-23 ENCOUNTER — Telehealth: Payer: Self-pay | Admitting: Family Medicine

## 2020-12-23 NOTE — Progress Notes (Signed)
  Care Management   Follow Up Note   12/23/2020 Name: Joshua Hahn MRN: [id number] DOB: [birth year]   Referred by: Marin Olp, MD Reason for referral : No chief complaint on file.   An unsuccessful telephone outreach was attempted today. The patient was referred to the case management team for assistance with care management and care coordination.   Follow Up Plan: No further follow up required:    [place]

## 2020-12-23 NOTE — Chronic Care Management (AMB) (Signed)
    Chronic Care Management Pharmacy Assistant   Name: Joshua Hahn  MRN: [id number] DOB: Mar 23, 1946  Reason for Encounter: Reschedule appointment with clinical pharmacist    Medications: Outpatient Encounter Medications as of 12/22/2020  Medication Sig   amLODipine (NORVASC) 5 MG tablet Take 1 tablet (5 mg total) by mouth daily.   aspirin 81 MG tablet Take 81 mg by mouth daily.   cholecalciferol (VITAMIN D) 1000 UNITS tablet Take 1,000 Units by mouth daily.   clopidogrel (PLAVIX) 75 MG tablet TAKE 1 TABLET BY MOUTH EVERY DAY   Coenzyme Q10 (CO Q 10) 100 MG CAPS Take 300 mg by mouth every evening.   isosorbide mononitrate (IMDUR) 60 MG 24 hr tablet TAKE 1 TABLET (60 MG TOTAL) BY MOUTH DAILY. NEED OV FOR FUTURE REFILL   metoprolol tartrate (LOPRESSOR) 25 MG tablet TAKE 1 TABLET (25 MG TOTAL) BY MOUTH EVERY MORNING AND 0.5 TABLETS (12.5 MG TOTAL) EVERY EVENING.   nitroGLYCERIN (NITROSTAT) 0.4 MG SL tablet Place 1 tablet (0.4 mg total) under the tongue every 5 (five) minutes as needed for chest pain.   omega-3 acid ethyl esters (LOVAZA) 1 g capsule Take 1 capsule (1 g total) by mouth 2 (two) times daily.   ranolazine (RANEXA) 500 MG 12 hr tablet Take 1 tablet (500 mg total) by mouth 2 (two) times daily.   rosuvastatin (CRESTOR) 40 MG tablet Take 1 tablet (40 mg total) by mouth daily.   valsartan (DIOVAN) 80 MG tablet Take 1 tablet (80 mg total) by mouth daily. To replace losartan '50mg'$    vitamin B-12 (CYANOCOBALAMIN) 100 MCG tablet Take 100 mcg by mouth daily.    No facility-administered encounter medications on file as of 12/22/2020.   Patient rescheduled his initial appointment for 02/06/2021 at 9:00 am.  Future Appointments  Date Time Provider [place]  02/06/2021  9:00 AM [facility] CCM PHARMACIST [facility]     April D Calhoun, [place] Pharmacist Assistant [phone]

## 2021-01-02 ENCOUNTER — Telehealth: Payer: Medicare Other

## 2021-01-31 ENCOUNTER — Telehealth: Payer: Self-pay | Admitting: Pharmacist

## 2021-01-31 NOTE — Chronic Care Management (AMB) (Signed)
Chronic Care Management Pharmacy Assistant   Name: Joshua Hahn  MRN: [id number] DOB: January 02, 1946  Joshua Hahn is an 75 y.o. year old male who presents for his initial CCM visit with the clinical pharmacist.  Reason for Encounter: Chart Prep For Initial Visit With Clinical Pharmacist   Conditions to be addressed/monitored: HTN, CAD, OSA, HLD, CKD  Primary concerns for visit include: HTN, HLD  Recent office visits:  None  Recent consult visits:  11/28/2020 OV (cardiology) Joshua Sine, MD; With his blood pressure elevation today, I am starting amlodipine 5 mg.  Tomorrow he will come back to the office for fasting laboratory with a comprehensive metabolic panel, CBC, TSH and lipid studies.  If renal function has further deteriorated we may need to discontinue valsartan.   08/08/2020 OV (cardiology) Joshua Sine, MD; no medication changes indicated.  Hospital visits:  09/29/2020 ED Visit at [facility] Emergency Department Positive COVID No medication changes indicated.  Medications: Outpatient Encounter Medications as of 01/31/2021  Medication Sig   amLODipine (NORVASC) 5 MG tablet Take 1 tablet (5 mg total) by mouth daily.   aspirin 81 MG tablet Take 81 mg by mouth daily.   cholecalciferol (VITAMIN D) 1000 UNITS tablet Take 1,000 Units by mouth daily.   clopidogrel (PLAVIX) 75 MG tablet TAKE 1 TABLET BY MOUTH EVERY DAY   Coenzyme Q10 (CO Q 10) 100 MG CAPS Take 300 mg by mouth every evening.   ezetimibe (ZETIA) 10 MG tablet Take 1 tablet (10 mg total) by mouth daily.   isosorbide mononitrate (IMDUR) 60 MG 24 hr tablet TAKE 1 TABLET (60 MG TOTAL) BY MOUTH DAILY. NEED OV FOR FUTURE REFILL   metoprolol tartrate (LOPRESSOR) 25 MG tablet TAKE 1 TABLET (25 MG TOTAL) BY MOUTH EVERY MORNING AND 0.5 TABLETS (12.5 MG TOTAL) EVERY EVENING.   nitroGLYCERIN (NITROSTAT) 0.4 MG SL tablet Place 1 tablet (0.4 mg total) under the tongue every 5 (five) minutes as needed for  chest pain.   omega-3 acid ethyl esters (LOVAZA) 1 g capsule Take 1 capsule (1 g total) by mouth 2 (two) times daily.   ranolazine (RANEXA) 500 MG 12 hr tablet Take 1 tablet (500 mg total) by mouth 2 (two) times daily.   rosuvastatin (CRESTOR) 40 MG tablet Take 1 tablet (40 mg total) by mouth daily.   valsartan (DIOVAN) 80 MG tablet Take 1 tablet (80 mg total) by mouth daily. To replace losartan 50mg    vitamin B-12 (CYANOCOBALAMIN) 100 MCG tablet Take 100 mcg by mouth daily.    No facility-administered encounter medications on file as of 01/31/2021.   Current Medications: Amlodipine 5 mg Isosorbide mononitrate 60 mg Clopidogrel 75 mg Rosuvastatin 40 mg last filled 01/30/2021 90 DS Metoprolol Tartrate 25 mg Omega-3 Ranolazine 500 mg Ezetimibe 10 mg Valsartan 80 mg last filled 01/17/2021 90 DS Nitroglycerin 0.4 mg CoQ10 Vitamin B-12 3000 mcg Vitmain D 1000 units Aspirin 81 mg  [facility].  Patient Questions: Any changes in your medications or health? Patient states he hasn't had any recent changes in his health. He states he recently started taking Ranolazine 500 mg twice a day for his blood pressure.  Any side effects from any medications?  He states he does not have any side effects from any of his medications at this time.  Do you have any symptoms or problems not managed by your medications? He states he does not have any symptoms or problems that is not currently  managed by his medications.  Any concerns about your health right now? He denies any concerns about his health at this time.  Has your provider asked that you check blood pressure, blood sugar, or follow special diet at home? He states he checks his blood pressure but not his blood sugar. He does not follow any special diet at this time.  Do you get any type of exercise on a regular basis? He states he does not exercise on a regular basis.  Can you think of a goal you would like to reach for your  health? Patient stated "I would like to lose about 10-15 lbs."   Do you have any problems getting your medications? He denies any problems getting any of his medications.  Is there anything that you would like to discuss during the appointment?  Patient stated "If it comes up I will deal with it when I get there."  Please bring medications and supplements to appointment.   Care Gaps: Medicare Annual Wellness: Due now - scheduled for 03/13/2021 at 11:00 Hemoglobin A1C: 7.0% on 04/28/2020 Colonoscopy: Aged Out  Future Appointments  Date Time Provider [place]  02/06/2021  9:00 AM [facility] CCM PHARMACIST [facility]    Star Rating Drugs: Rosuvastatin 40 mg last filled 01/30/2021 90 DS Valsartan 80 mg last filled 01/17/2021 90 DS  April D Calhoun, [place] Pharmacist Assistant [phone]

## 2021-02-06 ENCOUNTER — Ambulatory Visit (INDEPENDENT_AMBULATORY_CARE_PROVIDER_SITE_OTHER): Payer: Medicare Other | Admitting: Pharmacist

## 2021-02-06 DIAGNOSIS — I25118 Atherosclerotic heart disease of native coronary artery with other forms of angina pectoris: Secondary | ICD-10-CM

## 2021-02-06 DIAGNOSIS — E785 Hyperlipidemia, unspecified: Secondary | ICD-10-CM

## 2021-02-06 DIAGNOSIS — I10 Essential (primary) hypertension: Secondary | ICD-10-CM

## 2021-02-06 NOTE — Progress Notes (Signed)
Chronic Care Management Pharmacy Note  02/06/2021 Name:  Joshua Hahn MRN:  [id number] DOB:  1945/09/17  Summary: Initial visit with PharmD.  Patient very active lifestyle is a vegetarian.  All meds reviewed and updated.  Will assist where able to with copay in [place].  Med adherence discussed/reviewed and he appears to be 100% adherent.  Recommendations/Changes made from today's visit: Consider [facility] for cash savings on Lovaza  Plan: FU 6 months   Subjective: Joshua Hahn is an 75 y.o. year old male who is a primary patient of Hunter, Brayton Mars, MD.  The CCM team was consulted for assistance with disease management and care coordination needs.    Engaged with patient by telephone for initial visit in response to provider referral for pharmacy case management and/or care coordination services.   Consent to Services:  The patient was given the following information about Chronic Care Management services today, agreed to services, and gave verbal consent: 1. CCM service includes personalized support from designated clinical staff supervised by the primary care provider, including individualized plan of care and coordination with other care providers 2. 24/7 contact phone numbers for assistance for urgent and routine care needs. 3. Service will only be billed when office clinical staff spend 20 minutes or more in a month to coordinate care. 4. Only one practitioner may furnish and bill the service in a calendar month. 5.The patient may stop CCM services at any time (effective at the end of the month) by phone call to the office staff. 6. The patient will be responsible for cost sharing (co-pay) of up to 20% of the service fee (after annual deductible is met). Patient agreed to services and consent obtained.  Patient Care Team: Marin Olp, MD as PCP - General (Family Medicine) [name], MD as PCP - Cardiology (Cardiology) Marin Olp, MD (Family Medicine) Inda Coke, [place] as Physician Assistant (Physician Assistant) Demarco, [place], [place] as Consulting Physician (Optometry) Edythe Clarity, [facility] as Pharmacist (Pharmacist)  Recent office visits:  None   Recent consult visits:  11/28/2020 OV (cardiology) [name], MD; With his blood pressure elevation today, I am starting amlodipine 5 mg.  Tomorrow he will come back to the office for fasting laboratory with a comprehensive metabolic panel, CBC, TSH and lipid studies.  If renal function has further deteriorated we may need to discontinue valsartan.    08/08/2020 OV (cardiology) [name], MD; no medication changes indicated.   Hospital visits:  09/29/2020 ED Visit at [facility] Emergency Department Positive COVID No medication changes indicated.   Objective:  Lab Results  Component Value Date   CREATININE 1.90 (H) 11/29/2020   BUN 15 11/29/2020   GFR 52.10 (L) 01/09/2018   GFRNONAA 35 (L) 09/29/2020   GFRAA 39 (L) 04/28/2020   NA 139 11/29/2020   K 5.0 11/29/2020   CALCIUM 9.5 11/29/2020   CO2 24 11/29/2020   GLUCOSE 110 (H) 11/29/2020    Lab Results  Component Value Date/Time   HGBA1C 7.0 (H) 04/28/2020 09:46 AM   HGBA1C (H) 10/23/2009 07:21 PM    6.4 (NOTE)  According to the ADA Clinical Practice Recommendations for 2011, when HbA1c is used as a screening test:   >=6.5%   Diagnostic of Diabetes Mellitus           (if abnormal result  is confirmed)  5.7-6.4%   Increased risk of developing Diabetes Mellitus  References:Diagnosis and Classification of Diabetes Mellitus,Diabetes ZESP,2330,07(MAUQJ 1):S62-S69 and Standards of Medical Care in         Diabetes - 2011,Diabetes FHLK,5625,63  (Suppl 1):S11-S61.   GFR 52.10 (L) 01/09/2018 12:29 PM   GFR 54.12 (L) 02/27/2012 05:18 PM    Last diabetic Eye exam: No results found for: HMDIABEYEEXA  Last diabetic Foot exam: No results found for:  HMDIABFOOTEX   Lab Results  Component Value Date   CHOL 113 11/29/2020   HDL 39 (L) 11/29/2020   LDLCALC 54 11/29/2020   LDLDIRECT 66.0 01/09/2018   TRIG 107 11/29/2020   CHOLHDL 2.9 11/29/2020    Hepatic Function Latest Ref Rng & Units 11/29/2020 04/28/2020 02/03/2019  Total Protein 6.0 - 8.5 g/dL 6.7 6.7 6.7  Albumin 3.7 - 4.7 g/dL 4.0 4.4 4.0  AST 0 - 40 IU/L 36 29 22  ALT 0 - 44 IU/L 23 22 15   Alk Phosphatase 44 - 121 IU/L 74 84 71  Total Bilirubin 0.0 - 1.2 mg/dL 0.6 0.7 0.6  Bilirubin, Direct 0.0 - 0.3 mg/dL - - -    Lab Results  Component Value Date/Time   TSH 2.280 11/29/2020 10:06 AM   TSH 2.030 04/28/2020 09:46 AM    CBC Latest Ref Rng & Units 11/29/2020 09/29/2020 04/28/2020  WBC 3.4 - 10.8 x10E3/uL 6.1 7.2 7.2  Hemoglobin 13.0 - 17.7 g/dL 14.3 14.0 15.1  Hematocrit 37.5 - 51.0 % 43.5 43.1 44.8  Platelets 150 - 450 x10E3/uL 179 169 189    Lab Results  Component Value Date/Time   VD25OH 41.84 01/09/2018 12:29 PM   VD25OH 40 09/30/2014 12:00 AM    Clinical ASCVD: Yes  The ASCVD Risk score (Arnett DK, et al., 2019) failed to calculate for the following reasons:   The patient has a prior MI or stroke diagnosis    Depression screen [facility] 2/9 06/14/2020 02/10/2019 01/01/2018  Decreased Interest 0 0 0  Down, Depressed, Hopeless 0 0 0  PHQ - 2 Score 0 0 0  Altered sleeping - - 0  Tired, decreased energy - - 0  Change in appetite - - 0  Feeling bad or failure about yourself  - - 0  Trouble concentrating - - 0  Moving slowly or fidgety/restless - - 0  Suicidal thoughts - - 0  PHQ-9 Score - - 0    Social History   Tobacco Use  Smoking Status Former   Types: Cigarettes   Quit date: 05/07/1973   Years since quitting: 47.7  Smokeless Tobacco Never   BP Readings from Last 3 Encounters:  11/28/20 (!) 160/80  09/29/20 140/72  08/08/20 138/72   Pulse Readings from Last 3 Encounters:  09/29/20 63  08/08/20 (!) 57  06/14/20 (!) 56   Wt Readings from Last 3  Encounters:  11/28/20 200 lb (90.7 kg)  09/29/20 210 lb (95.3 kg)  08/08/20 221 lb (100.2 kg)   BMI Readings from Last 3 Encounters:  11/28/20 28.70 kg/m  09/29/20 29.29 kg/m  08/08/20 30.82 kg/m    Assessment/Interventions: Review of patient past medical history, allergies, medications, health status, including review of consultants reports, laboratory and other test data, was performed as part  of comprehensive evaluation and provision of chronic care management services.   SDOH:  (Social Determinants of Health) assessments and interventions performed: Yes  Financial Resource Strain: Not on file    SDOH Screenings   Alcohol Screen: Not on file  Depression (PHQ2-9): Low Risk    PHQ-2 Score: 0  Financial Resource Strain: Not on file  Food Insecurity: Not on file  Housing: Not on file  Physical Activity: Not on file  Social Connections: Not on file  Stress: Not on file  Tobacco Use: Medium Risk   Smoking Tobacco Use: Former   Smokeless Tobacco Use: Never  Transportation Needs: Not on file    [place]  No Known Allergies  Medications Reviewed Today     Reviewed by Edythe Clarity, [facility] (Pharmacist) on 02/06/21 at 0921  Med List Status: <None>   Medication Order Taking? Sig Documenting Provider Last Dose Status Informant  amLODipine (NORVASC) 5 MG tablet [id number] Yes Take 1 tablet (5 mg total) by mouth daily. [name], MD Taking Active   aspirin 81 MG tablet [id number] Yes Take 81 mg by mouth daily. [provider] Taking Active Self  cholecalciferol (VITAMIN D) 1000 UNITS tablet [id number] Yes Take 1,000 Units by mouth daily. [provider] Taking Active Self  clopidogrel (PLAVIX) 75 MG tablet [id number] Yes TAKE 1 TABLET BY MOUTH EVERY DAY [name], MD Taking Active   Coenzyme Q10 (CO Q 10) 100 MG CAPS [id number] Yes Take 300 mg by mouth every evening. [provider] Taking Active Self  ezetimibe (ZETIA) 10 MG tablet  [id number]  Take 1 tablet (10 mg total) by mouth daily. [name], MD  Expired 08/01/20 2359   isosorbide mononitrate (IMDUR) 60 MG 24 hr tablet [id number] Yes TAKE 1 TABLET (60 MG TOTAL) BY MOUTH DAILY. NEED OV FOR FUTURE REFILL [name], MD Taking Active   metoprolol tartrate (LOPRESSOR) 25 MG tablet [id number] Yes TAKE 1 TABLET (25 MG TOTAL) BY MOUTH EVERY MORNING AND 0.5 TABLETS (12.5 MG TOTAL) EVERY EVENING. [name], MD Taking Active   nitroGLYCERIN (NITROSTAT) 0.4 MG SL tablet [id number] Yes Place 1 tablet (0.4 mg total) under the tongue every 5 (five) minutes as needed for chest pain. [name], MD Taking Active   omega-3 acid ethyl esters (LOVAZA) 1 g capsule [id number] Yes Take 1 capsule (1 g total) by mouth 2 (two) times daily. [name], MD Taking Active   ranolazine (RANEXA) 500 MG 12 hr tablet [id number] Yes Take 1 tablet (500 mg total) by mouth 2 (two) times daily. [name], MD Taking Active   rosuvastatin (CRESTOR) 40 MG tablet [id number] Yes Take 1 tablet (40 mg total) by mouth daily. [name], MD Taking Active   valsartan (DIOVAN) 80 MG tablet [id number] Yes Take 1 tablet (80 mg total) by mouth daily. To replace losartan 49m KTroy Sine MD Taking Active   vitamin B-12 (CYANOCOBALAMIN) 100 MCG tablet [id number] Take 100 mcg by mouth daily.  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   Hyperlipidemia 12/18/2006   Essential hypertension 12/18/2006    Immunization History  Administered Date(s)  Administered   Influenza-Unspecified 08/06/2017   PFIZER Comirnaty(Gray Top)Covid-19 Tri-Sucrose Vaccine 11/30/2020   Pneumococcal Conjugate-13 09/28/2014   Pneumococcal Polysaccharide-23 07/10/2017   Td 05/08/1999, 10/08/2014   Zoster Recombinat (Shingrix) 02/10/2019    Conditions to be addressed/monitored:  HTN, CAD, Hx of MI, HLD  Care Plan : General Pharmacy (Adult)  Updates made by Edythe Clarity, RPH since 02/06/2021 12:00 AM     Problem: HTN, CAD, Hx of MI, HLD   Priority: High  Onset Date: 02/06/2021     Long-Range Goal: Patient-Specific Goal   Start Date: 02/06/2021  Expected End Date: 08/07/2021  This Visit's Progress: On track  Priority: High  Note:   Current Barriers:  High copay with Lovaza  Pharmacist Clinical Goal(s):  Patient will maintain control of BP and lipids as evidenced by labs  through collaboration with PharmD and provider.   Interventions: 1:1 collaboration with Marin Olp, MD regarding development and update of comprehensive plan of care as evidenced by provider attestation and co-signature Inter-disciplinary care team collaboration (see longitudinal plan of care) Comprehensive medication review performed; medication list updated in electronic medical record  Hypertension (BP goal <130/80) -Controlled -Current treatment: Amlodipine 93m daily Valsartan 852mdaily Metoprolol tartrate 2545mID Imdur 35m56mhr daily -Medications previously tried: none noted  -Current home readings: 116-120s/60-70s -Current dietary habits: vegetarian gets protein from tofu -Current exercise habits: has personal trianer, does cardio and strength training with his wife, also [facility] hypotensive/hypertensive symptoms -Educated on BP goals and benefits of medications for prevention of heart attack, stroke and kidney damage; Daily salt intake goal < 2300 mg; Exercise goal of 150 minutes per week; Symptoms of hypotension and  importance of maintaining adequate hydration; -Counseled to monitor BP at home weekly, document, and provide log at future appointments -Recommended to continue current medication  Hyperlipidemia: (LDL goal < 70) -Controlled -Current treatment: Rosuvastatin 40mg92mly Zetia 10mg 58my Lovaza 1g BID -Medications previously tried: none noted  -Current dietary patterns: vegetaria -Current exercise habits: see above -Educated on Cholesterol goals;  Benefits of statin for ASCVD risk reduction; Importance of limiting foods high in cholesterol; -Most recent LDL is controlled - denies any adverse effects from medication -Recommended to continue current medication Assessed patient finances. He reports high copay for Lovaza - will look into cash option at [facility] to try and save him $$ where possible.  Hx of MI, CAD (Goal: Reduce CV risk) -Controlled -Current treatment  Clopidogrel 75mg d64m ASA 81mg -M85mations previously tried: none noted  -Patient with bypass in 1992, and NSTEMI with stent placement in 2014 -Denies any abnormal bleeding or bruising -Recommended to continue current medication  Patient Goals/Self-Care Activities Patient will:  - take medications as prescribed check blood pressure weekly, document, and provide at future appointments target a minimum of 150 minutes of moderate intensity exercise weekly  Follow Up Plan: The care management team will reach out to the patient again over the next 180 days.         Medication Assistance: None required.  Patient affirms current coverage meets needs.  Compliance/Adherence/Medication fill history: Care Gaps: AWV - scheduled  Star-Rating Drugs: Rosuvastatin 40mg 09/33m2 90ds Valsartan 80mg dail64m/13/22 90ds  Patient's [facility] pharmacy [facility]:  [id number] [id number] [address]  [place] [address] Phone: [phone] Fax: [phone]  [id number]  [address] [place]: [phone]: [address] [place]Phone: [phone]: [phone] [id number] [address] [place]one: [phone]: [phone] pill box? No - has own organization method, takes them on trips Pt endorses 100% compliance  We discussed: Benefits of medication synchronization, packaging and delivery as well as enhanced pharmacist oversight with [facility]. Patient decided to: Continue current medication management strategy - patient considering onboarding with [facility] Follow Up Patient Decision:  Patient agrees to Care Plan and Follow-up.  Plan: The care management team will reach out to the patient again over the next 180 days.  ChBeverly MilchPharmD Clinical Pharmacist ([phone]

## 2021-02-06 NOTE — Patient Instructions (Addendum)
Visit Information   Goals Addressed             This Visit's Progress    Track and Manage My Blood Pressure-Hypertension       Timeframe:  Long-Range Goal Priority:  High Start Date: 02/06/21                            Expected End Date:   08/07/20                    Follow Up Date 05/09/20    - check blood pressure weekly - choose a place to take my blood pressure (home, clinic or office, retail store) - write blood pressure results in a log or diary    Why is this important?   You won't feel high blood pressure, but it can still hurt your blood vessels.  High blood pressure can cause heart or kidney problems. It can also cause a stroke.  Making lifestyle changes like losing a little weight or eating less salt will help.  Checking your blood pressure at home and at different times of the day can help to control blood pressure.  If the doctor prescribes medicine remember to take it the way the doctor ordered.  Call the office if you cannot afford the medicine or if there are questions about it.     Notes:        Patient Care Plan: General Pharmacy (Adult)     Problem Identified: HTN, CAD, Hx of MI, HLD   Priority: High  Onset Date: 02/06/2021     Long-Range Goal: Patient-Specific Goal   Start Date: 02/06/2021  Expected End Date: 08/07/2021  This Visit's Progress: On track  Priority: High  Note:   Current Barriers:  High copay with Lovaza  Pharmacist Clinical Goal(s):  Patient will maintain control of BP and lipids as evidenced by labs  through collaboration with PharmD and provider.   Interventions: 1:1 collaboration with Marin Olp, MD regarding development and update of comprehensive plan of care as evidenced by provider attestation and co-signature Inter-disciplinary care team collaboration (see longitudinal plan of care) Comprehensive medication review performed; medication list updated in electronic medical record  Hypertension (BP goal  <130/80) -Controlled -Current treatment: Amlodipine 5mg  daily Valsartan 80mg  daily Metoprolol tartrate 25mg  BID Imdur 60mg  24hr daily -Medications previously tried: none noted  -Current home readings: 116-120s/60-70s -Current dietary habits: vegetarian gets protein from tofu -Current exercise habits: has personal trianer, does cardio and strength training with his wife, also plans to start swinning at [facility] -Denies hypotensive/hypertensive symptoms -Educated on BP goals and benefits of medications for prevention of heart attack, stroke and kidney damage; Daily salt intake goal < 2300 mg; Exercise goal of 150 minutes per week; Symptoms of hypotension and importance of maintaining adequate hydration; -Counseled to monitor BP at home weekly, document, and provide log at future appointments -Recommended to continue current medication  Hyperlipidemia: (LDL goal < 70) -Controlled -Current treatment: Rosuvastatin 40mg  daily Zetia 10mg  daily Lovaza 1g BID -Medications previously tried: none noted  -Current dietary patterns: vegetaria -Current exercise habits: see above -Educated on Cholesterol goals;  Benefits of statin for ASCVD risk reduction; Importance of limiting foods high in cholesterol; -Most recent LDL is controlled - denies any adverse effects from medication -Recommended to continue current medication Assessed patient finances. He reports high copay for Lovaza - will look into cash option at [employer] to try and  save him $$ where possible.  Hx of MI, CAD (Goal: Reduce CV risk) -Controlled -Current treatment  Clopidogrel 75mg  daily ASA 81mg  -Medications previously tried: none noted  -Patient with bypass in 1992, and NSTEMI with stent placement in 2014 -Denies any abnormal bleeding or bruising -Recommended to continue current medication  Patient Goals/Self-Care Activities Patient will:  - take medications as prescribed check blood pressure weekly, document, and provide  at future appointments target a minimum of 150 minutes of moderate intensity exercise weekly  Follow Up Plan: The care management team will reach out to the patient again over the next 180 days.        Joshua Hahn was given information about Chronic Care Management services today including:  CCM service includes personalized support from designated clinical staff supervised by his physician, including individualized plan of care and coordination with other care providers 24/7 contact phone numbers for assistance for urgent and routine care needs. Standard insurance, coinsurance, copays and deductibles apply for chronic care management only during months in which we provide at least 20 minutes of these services. Most insurances cover these services at 100%, however patients may be responsible for any copay, coinsurance and/or deductible if applicable. This service may help you avoid the need for more expensive face-to-face services. Only one practitioner may furnish and bill the service in a calendar month. The patient may stop CCM services at any time (effective at the end of the month) by phone call to the office staff.  Patient agreed to services and verbal consent obtained.   The patient verbalized understanding of instructions, educational materials, and care plan provided today and agreed to receive a mailed copy of patient instructions, educational materials, and care plan.  Telephone follow up appointment with pharmacy team member scheduled for: 6 months  Edythe Clarity, [place]

## 2021-02-09 ENCOUNTER — Telehealth: Payer: Self-pay | Admitting: Pharmacist

## 2021-02-09 NOTE — Chronic Care Management (AMB) (Signed)
    Chronic Care Management Pharmacy Assistant   Name: Joshua Hahn  MRN: [id number] DOB: [birth year]   Reason for Encounter: Medication Cost Review     Medications: Outpatient Encounter Medications as of 02/09/2021  Medication Sig   amLODipine (NORVASC) 5 MG tablet Take 1 tablet (5 mg total) by mouth daily.   aspirin 81 MG tablet Take 81 mg by mouth daily.   cholecalciferol (VITAMIN D) 1000 UNITS tablet Take 1,000 Units by mouth daily.   clopidogrel (PLAVIX) 75 MG tablet TAKE 1 TABLET BY MOUTH EVERY DAY   Coenzyme Q10 (CO Q 10) 100 MG CAPS Take 300 mg by mouth every evening.   ezetimibe (ZETIA) 10 MG tablet Take 1 tablet (10 mg total) by mouth daily.   isosorbide mononitrate (IMDUR) 60 MG 24 hr tablet TAKE 1 TABLET (60 MG TOTAL) BY MOUTH DAILY. NEED OV FOR FUTURE REFILL   metoprolol tartrate (LOPRESSOR) 25 MG tablet TAKE 1 TABLET (25 MG TOTAL) BY MOUTH EVERY MORNING AND 0.5 TABLETS (12.5 MG TOTAL) EVERY EVENING.   nitroGLYCERIN (NITROSTAT) 0.4 MG SL tablet Place 1 tablet (0.4 mg total) under the tongue every 5 (five) minutes as needed for chest pain.   omega-3 acid ethyl esters (LOVAZA) 1 g capsule Take 1 capsule (1 g total) by mouth 2 (two) times daily.   ranolazine (RANEXA) 500 MG 12 hr tablet Take 1 tablet (500 mg total) by mouth 2 (two) times daily.   rosuvastatin (CRESTOR) 40 MG tablet Take 1 tablet (40 mg total) by mouth daily.   valsartan (DIOVAN) 80 MG tablet Take 1 tablet (80 mg total) by mouth daily. To replace losartan 50mg    vitamin B-12 (CYANOCOBALAMIN) 100 MCG tablet Take 100 mcg by mouth daily.    No facility-administered encounter medications on file as of 02/09/2021.   Medication Cost Review Completed  Patients preferred pharmacies are [facility], [facility] and [facility].  All of his prescription medications will be cheapest at these pharmacies.  Patient is aware and verbally understands this information.  April D Calhoun, [place] Pharmacist  Assistant [phone]

## 2021-02-10 ENCOUNTER — Telehealth: Payer: Self-pay | Admitting: Pharmacist

## 2021-02-10 NOTE — Progress Notes (Signed)
    Chronic Care Management Pharmacy Assistant   Name: Joshua Hahn  MRN: [id number] DOB: November 24, 1945  Reason for Encounter: CCM Care Plan  Medications: Outpatient Encounter Medications as of 02/10/2021  Medication Sig   amLODipine (NORVASC) 5 MG tablet Take 1 tablet (5 mg total) by mouth daily.   aspirin 81 MG tablet Take 81 mg by mouth daily.   cholecalciferol (VITAMIN D) 1000 UNITS tablet Take 1,000 Units by mouth daily.   clopidogrel (PLAVIX) 75 MG tablet TAKE 1 TABLET BY MOUTH EVERY DAY   Coenzyme Q10 (CO Q 10) 100 MG CAPS Take 300 mg by mouth every evening.   ezetimibe (ZETIA) 10 MG tablet Take 1 tablet (10 mg total) by mouth daily.   isosorbide mononitrate (IMDUR) 60 MG 24 hr tablet TAKE 1 TABLET (60 MG TOTAL) BY MOUTH DAILY. NEED OV FOR FUTURE REFILL   metoprolol tartrate (LOPRESSOR) 25 MG tablet TAKE 1 TABLET (25 MG TOTAL) BY MOUTH EVERY MORNING AND 0.5 TABLETS (12.5 MG TOTAL) EVERY EVENING.   nitroGLYCERIN (NITROSTAT) 0.4 MG SL tablet Place 1 tablet (0.4 mg total) under the tongue every 5 (five) minutes as needed for chest pain.   omega-3 acid ethyl esters (LOVAZA) 1 g capsule Take 1 capsule (1 g total) by mouth 2 (two) times daily.   ranolazine (RANEXA) 500 MG 12 hr tablet Take 1 tablet (500 mg total) by mouth 2 (two) times daily.   rosuvastatin (CRESTOR) 40 MG tablet Take 1 tablet (40 mg total) by mouth daily.   valsartan (DIOVAN) 80 MG tablet Take 1 tablet (80 mg total) by mouth daily. To replace losartan 50mg    vitamin B-12 (CYANOCOBALAMIN) 100 MCG tablet Take 100 mcg by mouth daily.    No facility-administered encounter medications on file as of 02/10/2021.   Reviewed the patients initial visit reinsured it was completed per the pharmacist Leata Mouse request. Printed the CCM care plan. Mailed the patient CCM care plan to their most recent address on file.  Follow-Up:Pharmacist Review  Charlann Lange, [place] Pharmacist Assistant [phone]

## 2021-02-20 ENCOUNTER — Other Ambulatory Visit: Payer: Self-pay | Admitting: Cardiovascular Disease

## 2021-03-02 DIAGNOSIS — C61 Malignant neoplasm of prostate: Secondary | ICD-10-CM | POA: Diagnosis not present

## 2021-03-02 DIAGNOSIS — I129 Hypertensive chronic kidney disease with stage 1 through stage 4 chronic kidney disease, or unspecified chronic kidney disease: Secondary | ICD-10-CM | POA: Diagnosis not present

## 2021-03-02 DIAGNOSIS — N183 Chronic kidney disease, stage 3 unspecified: Secondary | ICD-10-CM | POA: Diagnosis not present

## 2021-03-02 DIAGNOSIS — Z23 Encounter for immunization: Secondary | ICD-10-CM | POA: Diagnosis not present

## 2021-03-02 DIAGNOSIS — I2510 Atherosclerotic heart disease of native coronary artery without angina pectoris: Secondary | ICD-10-CM | POA: Diagnosis not present

## 2021-03-06 DIAGNOSIS — E785 Hyperlipidemia, unspecified: Secondary | ICD-10-CM

## 2021-03-06 DIAGNOSIS — I25118 Atherosclerotic heart disease of native coronary artery with other forms of angina pectoris: Secondary | ICD-10-CM | POA: Diagnosis not present

## 2021-03-06 DIAGNOSIS — I10 Essential (primary) hypertension: Secondary | ICD-10-CM

## 2021-03-23 ENCOUNTER — Ambulatory Visit (INDEPENDENT_AMBULATORY_CARE_PROVIDER_SITE_OTHER): Payer: Medicare Other

## 2021-03-23 ENCOUNTER — Other Ambulatory Visit: Payer: Self-pay

## 2021-03-23 DIAGNOSIS — Z1211 Encounter for screening for malignant neoplasm of colon: Secondary | ICD-10-CM | POA: Diagnosis not present

## 2021-03-23 DIAGNOSIS — Z86010 Personal history of colonic polyps: Secondary | ICD-10-CM | POA: Diagnosis not present

## 2021-03-23 DIAGNOSIS — Z0000 Encounter for general adult medical examination without abnormal findings: Secondary | ICD-10-CM | POA: Diagnosis not present

## 2021-03-23 NOTE — Progress Notes (Addendum)
Virtual Visit via Telephone Note  I connected with  Joshua Hahn on 03/23/21 at 11:00 AM EST by telephone and verified that I am speaking with the correct person using two identifiers.  Medicare Annual Wellness visit completed telephonically due to Covid-19 pandemic.   Persons participating in this call: This Health Coach and this patient.   Location: Patient: Home Provider: Office    I discussed the limitations, risks, security and privacy concerns of performing an evaluation and management service by telephone and the availability of in person appointments. The patient expressed understanding and agreed to proceed.  Unable to perform video visit due to video visit attempted and failed and/or patient does not have video capability.   Some vital signs may be absent or patient reported.   Willette Brace, LPN   Subjective:   Joshua Hahn is a 75 y.o. male who presents for Medicare Annual/Subsequent preventive examination.  Review of Systems     Cardiac Risk Factors include: advanced age (>51men, >93 women);hypertension;dyslipidemia;male gender     Objective:    There were no vitals filed for this visit. There is no height or weight on file to calculate BMI.  Advanced Directives 03/23/2021 09/29/2020 05/12/2020 03/21/2020 02/10/2019 01/01/2018 06/06/2016  Does Patient Have a Medical Advance Directive? Yes Yes Yes Yes Yes Yes Yes  Type of Advance Directive Healthcare Power of Attorney Living will;Healthcare Power of [place];Living will [place];Living will Living will;Healthcare Power of [place];Living will -  Does patient want to make changes to medical advance directive? - - No - Patient declined - No - Patient declined No - Patient declined -  Copy of [place] in Chart? No - copy requested - No - copy requested - No - copy requested No - copy requested -  Would patient like  information on creating a medical advance directive? - - - No - Patient declined - - -  Pre-existing out of facility DNR order (yellow form or pink MOST form) - - - - - - -    Current Medications (verified) Outpatient Encounter Medications as of 03/23/2021  Medication Sig   amLODipine (NORVASC) 5 MG tablet Take 1 tablet (5 mg total) by mouth daily.   Ascorbic Acid (VITAMIN C PO) Take by mouth.   aspirin 81 MG tablet Take 81 mg by mouth daily.   cholecalciferol (VITAMIN D) 1000 UNITS tablet Take 1,000 Units by mouth daily.   clopidogrel (PLAVIX) 75 MG tablet TAKE 1 TABLET BY MOUTH EVERY DAY   Coenzyme Q10 (CO Q 10) 100 MG CAPS Take 300 mg by mouth every evening.   isosorbide mononitrate (IMDUR) 60 MG 24 hr tablet TAKE 1 TABLET (60 MG TOTAL) BY MOUTH DAILY. NEED OV FOR FUTURE REFILL   metoprolol tartrate (LOPRESSOR) 25 MG tablet TAKE 1 TABLET (25 MG TOTAL) BY MOUTH EVERY MORNING AND 0.5 TABLETS (12.5 MG TOTAL) EVERY EVENING.   nitroGLYCERIN (NITROSTAT) 0.4 MG SL tablet Place 1 tablet (0.4 mg total) under the tongue every 5 (five) minutes as needed for chest pain.   omega-3 acid ethyl esters (LOVAZA) 1 g capsule Take 1 capsule (1 g total) by mouth 2 (two) times daily.   ranolazine (RANEXA) 500 MG 12 hr tablet Take 1 tablet (500 mg total) by mouth 2 (two) times daily.   rosuvastatin (CRESTOR) 40 MG tablet Take 1 tablet (40 mg total) by mouth daily.   valsartan (DIOVAN) 80 MG tablet  Take 1 tablet (80 mg total) by mouth daily. To replace losartan 50mg    vitamin B-12 (CYANOCOBALAMIN) 100 MCG tablet Take 100 mcg by mouth daily.    ezetimibe (ZETIA) 10 MG tablet Take 1 tablet (10 mg total) by mouth daily.   No facility-administered encounter medications on file as of 03/23/2021.    Allergies (verified) Patient has no known allergies.   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 levels LDL IIIa+b% and HDL2b%   Hypertension    Myocardial infarct [facility])    Myocardial infarction ([place]) 1994   inferior wall [facility] at [facility]G   Prostate cancer [facility])    Sleep apnea    uses CPAP PRN   Sleep apnea, obstructive    using C-PAP   Past Surgical History:  Procedure Laterality Date   ABDOMINAL SURGERY     CARDIAC CATHETERIZATION  06/23/2008   occlusion sequential OM1 andOM2 graft as well vein graft to RCA.LAD and diagonal system remain intact. RCA  collateralized   CORONARY ANGIOPLASTY WITH STENT PLACEMENT     CORONARY ARTERY BYPASS GRAFT  1994   EXPLORATORY LAPAROTOMY     ideopathic retroperitoneal fibrosis   LEFT HEART CATHETERIZATION WITH CORONARY/GRAFT ANGIOGRAM N/A 02/04/2013   Procedure: LEFT HEART CATHETERIZATION WITH Beatrix Fetters;  Surgeon: [name], MD;  Location: [facility] CATH LAB;  Service: Cardiovascular;  Laterality: N/A;   LEFT PAROTID GLAND SURGRY  16/9678   Dr. Erik Obey   PERCUTANEOUS CORONARY STENT INTERVENTION (PCI-S)  06/25/2008   rotational atherectomy w/diffuse disease native circ. w/drug eluting stents , PTCA on one vessel; grat occlusion to RCA w/native RCA proxim. occlusion. Circ.supplied collaaterals distal RCA  following intervention   PERCUTANEOUS CORONARY STENT INTERVENTION (PCI-S) N/A 02/05/2013   Procedure: PERCUTANEOUS CORONARY STENT INTERVENTION (PCI-S);  Surgeon: [name], MD;  Location[facility] C[facility] LAB;  Service: Cardiovascular;  Laterality: N/A;   PROSTATECTOMY  06/2009   robotic-asssisted laparoscopic radical by Dr Rosana Hoes   Family History  Problem Relation Age of Onset   Pancreatic cancer Father    Heart disease Father    Hypertension Father    Stroke Mother    Hypertension Mother    Coronary artery  disease Brother 85       2 bros with early CAD   Hypertension Brother    Coronary artery disease Brother    Hypertension Brother    Coronary artery disease Brother    Hypertension Brother    Heart attack Maternal Grandfather    Hypertension Paternal Grandfather    Lupus Daughter    Hypertension Son    Hypertension Sister    Hypertension Sister    Throat cancer Brother    Colon cancer Neg Hx    Esophageal cancer Neg Hx    Stomach cancer Neg Hx    Rectal cancer Neg Hx    Social History   Socioeconomic History   Marital status: Married    Spouse name: Not on file   Number of children: 2   Years of education: Not on file   Highest education level: Not on file  Occupational History   Occupation: Theme park manager -retired  Comment: [place]   Tobacco Use   Smoking status: Former    Types: Cigarettes    Quit date: 05/07/1973    Years since quitting: 47.9   Smokeless tobacco: Never  Vaping Use   Vaping Use: Never used  Substance and Sexual Activity   Alcohol use: No   Drug use: No   Sexual activity: Not on file  Other Topics Concern   Not on file  Social History Narrative   ** Merged History Encounter **       Married 48 years in 2016. 2 children. 5 grand kids.   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   Hobbies: speaking events, traveling   Social Determinants of Health   Financial Resource Strain: Low Risk    Difficulty of Paying Living Expenses: Not hard at all  Food Insecurity: No Food Insecurity   Worried About Charity fundraiser in the Last Year: Never true   Arboriculturist in the Last Year: Never true  Transportation Needs: No Transportation Needs   Lack of Transportation (Medical): No   Lack of Transportation (Non-Medical): No  Physical Activity: Sufficiently Active   Days of Exercise per Week: 2 days   Minutes of Exercise per Session: 120 min  Stress: No Stress Concern Present   Feeling of Stress : Not at all  Social Connections:  Socially Integrated   Frequency of Communication with Friends and Family: More than three times a week   Frequency of Social Gatherings with Friends and Family: More than three times a week   Attends Religious Services: More than 4 times per year   Active Member of [employer] or Organizations: Yes   Attends Archivist Meetings: 1 to 4 times per year   Marital Status: Married    Tobacco Counseling Counseling given: Not Answered   Clinical Intake:  Pre-visit preparation completed: Yes  Pain : No/denies pain     BMI - recorded: 28.7 Nutritional Status: BMI 25 -29 Overweight Nutritional Risks: None Diabetes: No  How often do you need to have someone help you when you read instructions, pamphlets, or other written materials from your doctor or pharmacy?: 1 - Never  Diabetic?no  Interpreter Needed?: No  Information entered by :: Charlott Rakes, LPN   Activities of Daily Living In your present state of health, do you have any difficulty performing the following activities: 03/23/2021  Hearing? N  Vision? N  Difficulty concentrating or making decisions? N  Walking or climbing stairs? N  Dressing or bathing? N  Doing errands, shopping? N  Preparing Food and eating ? N  Using the Toilet? N  In the past six months, have you accidently leaked urine? N  Do you have problems with loss of bowel control? N  Managing your Medications? N  Managing your Finances? N  Housekeeping or managing your Housekeeping? N  Some recent data might be hidden    Patient Care Team: Marin Olp, MD as PCP - General (Family Medicine) [name], MD as PCP - Cardiology (Cardiology) Marin Olp, MD (Family Medicine) Inda Coke, [place] as Physician Assistant (Physician Assistant) Demarco, [place], [place] as Consulting Physician (Optometry) Edythe Clarity, [facility] as Pharmacist (Pharmacist)  Indicate any recent Medical Services you may have received from other than [facility]  providers in the past year (date may be approximate).     Assessment:   This is a routine wellness examination for [place].  Hearing/Vision screen Hearing  Screening - Comments:: Pt denies any hearing issues  Vision Screening - Comments:: Pt follows up with Dr Baldemar Lenis for annual eye exams   Dietary issues and exercise activities discussed: Current Exercise Habits: Home exercise routine, Type of exercise: Other - see comments;strength training/weights;stretching;treadmill, Time (Minutes): > 60, Frequency (Times/Week): 2, Weekly Exercise (Minutes/Week): 0   Goals Addressed             This Visit's Progress    Patient Stated       Lose a few pounds        Depression Screen PHQ 2/9 Scores 03/23/2021 06/14/2020 02/10/2019 01/01/2018 01/01/2018 07/10/2017 06/07/2016  PHQ - 2 Score 0 0 0 0 0 0 0  PHQ- 9 Score - - - 0 - - -    Fall Risk Fall Risk  03/23/2021 06/14/2020 02/10/2019 01/01/2018 01/01/2018  Falls in the past year? 0 1 0 No No  Comment - - - - -  Number falls in past yr: 0 0 - - -  Injury with Fall? 0 1 0 - -  Risk for fall due to : Impaired vision - - - -  Follow up Falls prevention discussed - Education provided;Falls prevention discussed;Falls evaluation completed - -    FALL RISK PREVENTION PERTAINING TO THE HOME:  Any stairs in or around the home? Yes  If so, are there any without handrails? No  Home free of loose throw rugs in walkways, pet beds, electrical cords, etc? Yes  Adequate lighting in your home to reduce risk of falls? Yes   ASSISTIVE DEVICES UTILIZED TO PREVENT FALLS:  Life alert? No  Use of a cane, walker or w/c? No  Grab bars in the bathroom? Yes  Shower chair or bench in shower? No  Elevated toilet seat or a handicapped toilet? No   TIMED UP AND GO:  Was the test performed? No .  Cognitive Function:     6CIT Screen 03/23/2021 02/10/2019 01/01/2018  What Year? 0 points 0 points 0 points  What month? 0 points 0 points 0 points  What time? 0  points 0 points 0 points  Count back from 20 0 points 0 points 0 points  Months in reverse 0 points 0 points 0 points  Repeat phrase 0 points 0 points -  Total Score 0 0 -    Immunizations Immunization History  Administered Date(s) Administered   Influenza-Unspecified 08/06/2017, 02/20/2021   PFIZER Comirnaty(Gray Top)Covid-19 Tri-Sucrose Vaccine 11/30/2020   PFIZER(Purple Top)SARS-COV-2 Vaccination 02/24/2020   Pneumococcal Conjugate-13 09/28/2014   Pneumococcal Polysaccharide-23 07/10/2017   Td 05/08/1999, 10/08/2014   Zoster Recombinat (Shingrix) 02/10/2019, 10/09/2019    TDAP status: Up to date  Flu Vaccine status: Up to date  Pneumococcal vaccine status: Up to date  Covid-19 vaccine status: Completed vaccines  Qualifies for Shingles Vaccine? Yes   Zostavax completed Yes   Shingrix Completed?: Yes  Screening Tests Health Maintenance  Topic Date Due   COLONOSCOPY (Pts 45-39yrs Insurance coverage will need to be confirmed)  12/03/2019   COVID-19 Vaccine (3 - Pfizer risk series) 12/28/2020   TETANUS/TDAP  10/07/2024   Pneumonia Vaccine 50+ Years old  Completed   INFLUENZA VACCINE  Completed   Hepatitis C Screening  Completed   Zoster Vaccines- Shingrix  Completed   HPV VACCINES  Aged Out    Health Maintenance  Health Maintenance Due  Topic Date Due   COLONOSCOPY (Pts 45-70yrs Insurance coverage will need to be confirmed)  12/03/2019   COVID-19 Vaccine (  3 - Pfizer risk series) 12/28/2020    Colorectal cancer screening: Referral to GI placed 03/23/21. Pt aware the office will call re: appt.   Additional Screening:  Hepatitis C Screening: Completed 01/09/18  Vision Screening: Recommended annual ophthalmology exams for early detection of glaucoma and other disorders of the eye. Is the patient up to date with their annual eye exam?  Yes  Who is the provider or what is the name of the office in which the patient attends annual eye exams? Dr Baldemar Lenis  If  pt is not established with a provider, would they like to be referred to a provider to establish care? No .   Dental Screening: Recommended annual dental exams for proper oral hygiene  Community Resource Referral / Chronic Care Management: CRR required this visit?  No   CCM required this visit?  No      Plan:     I have personally reviewed and noted the following in the patient's chart:   Medical and social history Use of alcohol, tobacco or illicit drugs  Current medications and supplements including opioid prescriptions. Patient is not currently taking opioid prescriptions. Functional ability and status Nutritional status Physical activity Advanced directives List of other physicians Hospitalizations, surgeries, and ER visits in previous 12 months Vitals Screenings to include cognitive, depression, and falls Referrals and appointments  In addition, I have reviewed and discussed with patient certain preventive protocols, quality metrics, and best practice recommendations. A written personalized care plan for preventive services as well as general preventive health recommendations were provided to patient.     Willette Brace, LPN   22/29/7989   Nurse Notes: None

## 2021-03-23 NOTE — Patient Instructions (Addendum)
Mr. Every , Thank you for taking time to come for your Medicare Wellness Visit. I appreciate your ongoing commitment to your health goals. Please review the following plan we discussed and let me know if I can assist you in the future.   Screening recommendations/referrals: Colonoscopy: order placed 03/23/21 Recommended yearly ophthalmology/optometry visit for glaucoma screening and checkup Recommended yearly dental visit for hygiene and checkup  Vaccinations: Influenza vaccine: Done 02/20/21 Pneumococcal vaccine: Up to date Tdap vaccine: Done 10/08/14 repeat every 10 years  Shingles vaccine: Done 02/10/19 & 10/09/19   Covid-19: 02/24/20 & 11/26/18  Advanced directives: Please bring a copy of your health care power of attorney and living will to the office at your convenience.  Conditions/risks identified: lose a few lbs   Next appointment: Follow up in one year for your annual wellness visit.   Preventive Care 7 Years and Older, Male Preventive care refers to lifestyle choices and visits with your health care provider that can promote health and wellness. What does preventive care include? A yearly physical exam. This is also called an annual well check. Dental exams once or twice a year. Routine eye exams. Ask your health care provider how often you should have your eyes checked. Personal lifestyle choices, including: Daily care of your teeth and gums. Regular physical activity. Eating a healthy diet. Avoiding tobacco and drug use. Limiting alcohol use. Practicing safe sex. Taking low doses of aspirin every day. Taking vitamin and mineral supplements as recommended by your health care provider. What happens during an annual well check? The services and screenings done by your health care provider during your annual well check will depend on your age, overall health, lifestyle risk factors, and family history of disease. Counseling  Your health care provider may ask you questions  about your: Alcohol use. Tobacco use. Drug use. Emotional well-being. Home and relationship well-being. Sexual activity. Eating habits. History of falls. Memory and ability to understand (cognition). Work and work Statistician. Screening  You may have the following tests or measurements: Height, weight, and BMI. Blood pressure. Lipid and cholesterol levels. These may be checked every 5 years, or more frequently if you are over 1 years old. Skin check. Lung cancer screening. You may have this screening every year starting at age 52 if you have a 30-pack-year history of smoking and currently smoke or have quit within the past 15 years. Fecal occult blood test (FOBT) of the stool. You may have this test every year starting at age 43. Flexible sigmoidoscopy or colonoscopy. You may have a sigmoidoscopy every 5 years or a colonoscopy every 10 years starting at age 30. Prostate cancer screening. Recommendations will vary depending on your family history and other risks. Hepatitis C blood test. Hepatitis B blood test. Sexually transmitted disease (STD) testing. Diabetes screening. This is done by checking your blood sugar (glucose) after you have not eaten for a while (fasting). You may have this done every 1-3 years. Abdominal aortic aneurysm (AAA) screening. You may need this if you are a current or former smoker. Osteoporosis. You may be screened starting at age 71 if you are at high risk. Talk with your health care provider about your test results, treatment options, and if necessary, the need for more tests. Vaccines  Your health care provider may recommend certain vaccines, such as: Influenza vaccine. This is recommended every year. Tetanus, diphtheria, and acellular pertussis (Tdap, Td) vaccine. You may need a Td booster every 10 years. Zoster vaccine. You may need  this after age 61. Pneumococcal 13-valent conjugate (PCV13) vaccine. One dose is recommended after age 52. Pneumococcal  polysaccharide (PPSV23) vaccine. One dose is recommended after age 30. Talk to your health care provider about which screenings and vaccines you need and how often you need them. This information is not intended to replace advice given to you by your health care provider. Make sure you discuss any questions you have with your health care provider. Document Released: 05/20/2015 Document Revised: 01/11/2016 Document Reviewed: 02/22/2015 Elsevier Interactive Patient Education  2017 [place] Prevention in the Home Falls can cause injuries. They can happen to people of all ages. There are many things you can do to make your home safe and to help prevent falls. What can I do on the outside of my home? Regularly fix the edges of walkways and driveways and fix any cracks. Remove anything that might make you trip as you walk through a door, such as a raised step or threshold. Trim any bushes or trees on the path to your home. Use bright outdoor lighting. Clear any walking paths of anything that might make someone trip, such as rocks or tools. Regularly check to see if handrails are loose or broken. Make sure that both sides of any steps have handrails. Any raised decks and porches should have guardrails on the edges. Have any leaves, snow, or ice cleared regularly. Use sand or salt on walking paths during winter. Clean up any spills in your garage right away. This includes oil or grease spills. What can I do in the bathroom? Use night lights. Install grab bars by the toilet and in the tub and shower. Do not use towel bars as grab bars. Use non-skid mats or decals in the tub or shower. If you need to sit down in the shower, use a plastic, non-slip stool. Keep the floor dry. Clean up any water that spills on the floor as soon as it happens. Remove soap buildup in the tub or shower regularly. Attach bath mats securely with double-sided non-slip rug tape. Do not have throw rugs and other  things on the floor that can make you trip. What can I do in the bedroom? Use night lights. Make sure that you have a light by your bed that is easy to reach. Do not use any sheets or blankets that are too big for your bed. They should not hang down onto the floor. Have a firm chair that has side arms. You can use this for support while you get dressed. Do not have throw rugs and other things on the floor that can make you trip. What can I do in the kitchen? Clean up any spills right away. Avoid walking on wet floors. Keep items that you use a lot in easy-to-reach places. If you need to reach something above you, use a strong step stool that has a grab bar. Keep electrical cords out of the way. Do not use floor polish or wax that makes floors slippery. If you must use wax, use non-skid floor wax. Do not have throw rugs and other things on the floor that can make you trip. What can I do with my stairs? Do not leave any items on the stairs. Make sure that there are handrails on both sides of the stairs and use them. Fix handrails that are broken or loose. Make sure that handrails are as long as the stairways. Check any carpeting to make sure that it is firmly attached to  the stairs. Fix any carpet that is loose or worn. Avoid having throw rugs at the top or bottom of the stairs. If you do have throw rugs, attach them to the floor with carpet tape. Make sure that you have a light switch at the top of the stairs and the bottom of the stairs. If you do not have them, ask someone to add them for you. What else can I do to help prevent falls? Wear shoes that: Do not have high heels. Have rubber bottoms. Are comfortable and fit you well. Are closed at the toe. Do not wear sandals. If you use a stepladder: Make sure that it is fully opened. Do not climb a closed stepladder. Make sure that both sides of the stepladder are locked into place. Ask someone to hold it for you, if possible. Clearly  mark and make sure that you can see: Any grab bars or handrails. First and last steps. Where the edge of each step is. Use tools that help you move around (mobility aids) if they are needed. These include: Canes. Walkers. Scooters. Crutches. Turn on the lights when you go into a dark area. Replace any light bulbs as soon as they burn out. Set up your furniture so you have a clear path. Avoid moving your furniture around. If any of your floors are uneven, fix them. If there are any pets around you, be aware of where they are. Review your medicines with your doctor. Some medicines can make you feel dizzy. This can increase your chance of falling. Ask your doctor what other things that you can do to help prevent falls. This information is not intended to replace advice given to you by your health care provider. Make sure you discuss any questions you have with your health care provider. Document Released: 02/17/2009 Document Revised: 09/29/2015 Document Reviewed: 05/28/2014 Elsevier Interactive Patient Education  2017 [employer].

## 2021-04-06 ENCOUNTER — Other Ambulatory Visit: Payer: Self-pay | Admitting: Cardiovascular Disease

## 2021-05-11 DIAGNOSIS — Z23 Encounter for immunization: Secondary | ICD-10-CM | POA: Diagnosis not present

## 2021-05-14 DIAGNOSIS — Z20822 Contact with and (suspected) exposure to covid-19: Secondary | ICD-10-CM | POA: Diagnosis not present

## 2021-05-23 ENCOUNTER — Telehealth: Payer: Self-pay | Admitting: Pharmacist

## 2021-05-23 NOTE — Chronic Care Management (AMB) (Signed)
Chronic Care Management Pharmacy Assistant   Name: Joshua Hahn  MRN: [id number] DOB: 1945/06/16  Reason for Encounter: Hypertension Adherence Call    Recent office visits:  None  Recent consult visits:  None  Hospital visits:  None in previous 6 months  Medications: Outpatient Encounter Medications as of 05/23/2021  Medication Sig   amLODipine (NORVASC) 5 MG tablet Take 1 tablet (5 mg total) by mouth daily.   Ascorbic Acid (VITAMIN C PO) Take by mouth.   aspirin 81 MG tablet Take 81 mg by mouth daily.   cholecalciferol (VITAMIN D) 1000 UNITS tablet Take 1,000 Units by mouth daily.   clopidogrel (PLAVIX) 75 MG tablet TAKE 1 TABLET BY MOUTH EVERY DAY   Coenzyme Q10 (CO Q 10) 100 MG CAPS Take 300 mg by mouth every evening.   ezetimibe (ZETIA) 10 MG tablet TAKE 1 TABLET BY MOUTH EVERY DAY   isosorbide mononitrate (IMDUR) 60 MG 24 hr tablet TAKE 1 TABLET (60 MG TOTAL) BY MOUTH DAILY. NEED OV FOR FUTURE REFILL   metoprolol tartrate (LOPRESSOR) 25 MG tablet TAKE 1 TABLET (25 MG TOTAL) BY MOUTH EVERY MORNING AND 0.5 TABLETS (12.5 MG TOTAL) EVERY EVENING.   nitroGLYCERIN (NITROSTAT) 0.4 MG SL tablet Place 1 tablet (0.4 mg total) under the tongue every 5 (five) minutes as needed for chest pain.   omega-3 acid ethyl esters (LOVAZA) 1 g capsule Take 1 capsule (1 g total) by mouth 2 (two) times daily.   ranolazine (RANEXA) 500 MG 12 hr tablet Take 1 tablet (500 mg total) by mouth 2 (two) times daily.   rosuvastatin (CRESTOR) 40 MG tablet Take 1 tablet (40 mg total) by mouth daily.   valsartan (DIOVAN) 80 MG tablet TAKE 1 TABLET BY MOUTH EVERY DAY   vitamin B-12 (CYANOCOBALAMIN) 100 MCG tablet Take 100 mcg by mouth daily.    No facility-administered encounter medications on file as of 05/23/2021.   Reviewed chart prior to disease state call. Spoke with patient regarding BP  Recent Office Vitals: BP Readings from Last 3 Encounters:  11/28/20 (!) 160/80  09/29/20 140/72  08/08/20  138/72   Pulse Readings from Last 3 Encounters:  09/29/20 63  08/08/20 (!) 57  06/14/20 (!) 56    Wt Readings from Last 3 Encounters:  11/28/20 200 lb (90.7 kg)  09/29/20 210 lb (95.3 kg)  08/08/20 221 lb (100.2 kg)     Kidney Function Lab Results  Component Value Date/Time   CREATININE 1.90 (H) 11/29/2020 10:06 AM   CREATININE 1.96 (H) 09/29/2020 06:48 PM   CREATININE 1.46 (H) 08/17/2015 11:58 AM   CREATININE 1.62 (H) 11/22/2014 11:17 AM   GFR 52.10 (L) 01/09/2018 12:29 PM   GFRNONAA 35 (L) 09/29/2020 06:48 PM   GFRAA 39 (L) 04/28/2020 09:46 AM    BMP Latest Ref Rng & Units 11/29/2020 09/29/2020 04/28/2020  Glucose 65 - 99 mg/dL 110(H) 117(H) 121(H)  BUN 8 - 27 mg/dL 15 17 14   Creatinine 0.76 - 1.27 mg/dL 1.90(H) 1.96(H) 1.90(H)  BUN/Creat Ratio 10 - 24 8(L) - 7(L)  Sodium 134 - 144 mmol/L 139 133(L) 138  Potassium 3.5 - 5.2 mmol/L 5.0 4.4 5.4(H)  Chloride 96 - 106 mmol/L 103 101 102  CO2 20 - 29 mmol/L 24 25 23   Calcium 8.6 - 10.2 mg/dL 9.5 9.0 10.3(H)    Current antihypertensive regimen:  Isosorbide 60 mg daily Metoprolol Tartrate 25 mg Valsartan 80 mg daily Amlodipine 5 mg daily  How often are  you checking your Blood Pressure? daily  Current home BP readings: 125/75  What recent interventions/DTPs have been made by any provider to improve Blood Pressure control since last CPP Visit: No recent interventions or DTPs.  Any recent hospitalizations or ED visits since last visit with CPP? No  What diet changes have been made to improve Blood Pressure Control?  He states he cut out bread from his diet.  What exercise is being done to improve your Blood Pressure Control?  Monday and Wednesday he goes to see his "trainer".  Adherence Review: Is the patient currently on ACE/ARB medication? Yes Does the patient have >5 day gap between last estimated fill dates? No   Care Gaps: Medicare Annual Wellness: Completed 03/23/2021 Hemoglobin A1C: 6.4% on  04/28/2020 Colonoscopy: Overdue since 12/03/2019   Future Appointments  Date Time Provider [place]  08/08/2021  3:00 PM [facility] CCM PHARMACIST [facility]  04/05/2022 11:00 AM [facility] HEALTH COACH [facility]    Star Rating Drugs: Rosuvastatin 40 mg last filled 05/02/2021 90 DS Valsartan 80 mg last filled 04/07/2021 90 DS  April D Calhoun, [place] Pharmacist Assistant [phone]

## 2021-06-16 DIAGNOSIS — H25813 Combined forms of age-related cataract, bilateral: Secondary | ICD-10-CM | POA: Diagnosis not present

## 2021-06-16 DIAGNOSIS — H401132 Primary open-angle glaucoma, bilateral, moderate stage: Secondary | ICD-10-CM | POA: Diagnosis not present

## 2021-06-16 DIAGNOSIS — H524 Presbyopia: Secondary | ICD-10-CM | POA: Diagnosis not present

## 2021-06-21 ENCOUNTER — Other Ambulatory Visit: Payer: Self-pay | Admitting: Cardiovascular Disease

## 2021-06-23 ENCOUNTER — Other Ambulatory Visit: Payer: Self-pay | Admitting: Cardiovascular Disease

## 2021-06-27 ENCOUNTER — Telehealth: Payer: Self-pay | Admitting: Cardiovascular Disease

## 2021-06-27 MED ORDER — RANOLAZINE ER 500 MG PO TB12: 500.0000 mg | ORAL_TABLET | Freq: Two times a day (BID) | ORAL | 1 refills | Status: DC

## 2021-06-27 NOTE — Telephone Encounter (Signed)
°*  STAT* If patient is at the pharmacy, call can be transferred to refill team.   1. Which medications need to be refilled? (please list name of each medication and dose if known) ranolazine (RANEXA) 500 MG 12 hr tablet  2. Which pharmacy/location (including street and city if local pharmacy) is medication to be sent to? [id number] - [place], [place] - [address]  3. Do they need a 30 day or 90 day supply? [place]

## 2021-06-27 NOTE — Telephone Encounter (Signed)
RX has been sent.

## 2021-07-03 ENCOUNTER — Encounter: Payer: Self-pay | Admitting: Emergency Medicine

## 2021-07-03 ENCOUNTER — Other Ambulatory Visit: Payer: Self-pay

## 2021-07-03 ENCOUNTER — Ambulatory Visit
Admission: EM | Admit: 2021-07-03 | Discharge: 2021-07-03 | Disposition: A | Discharge status: Home / Self Care | Payer: Medicare Other | Attending: Family Medicine | Admitting: Family Medicine

## 2021-07-03 ENCOUNTER — Ambulatory Visit (INDEPENDENT_AMBULATORY_CARE_PROVIDER_SITE_OTHER): Payer: Medicare Other

## 2021-07-03 DIAGNOSIS — R059 Cough, unspecified: Secondary | ICD-10-CM | POA: Diagnosis not present

## 2021-07-03 DIAGNOSIS — R062 Wheezing: Secondary | ICD-10-CM | POA: Diagnosis not present

## 2021-07-03 DIAGNOSIS — R0602 Shortness of breath: Secondary | ICD-10-CM

## 2021-07-03 DIAGNOSIS — I517 Cardiomegaly: Secondary | ICD-10-CM | POA: Diagnosis not present

## 2021-07-03 DIAGNOSIS — J069 Acute upper respiratory infection, unspecified: Secondary | ICD-10-CM | POA: Diagnosis not present

## 2021-07-03 DIAGNOSIS — K449 Diaphragmatic hernia without obstruction or gangrene: Secondary | ICD-10-CM | POA: Diagnosis not present

## 2021-07-03 MED ORDER — BENZONATATE 100 MG PO CAPS: ORAL_CAPSULE | ORAL | 0 refills | Status: DC

## 2021-07-03 MED ORDER — PREDNISONE 20 MG PO TABS: 40.0000 mg | ORAL_TABLET | Freq: Every day | ORAL | 0 refills | Status: DC

## 2021-07-03 NOTE — ED Triage Notes (Signed)
Cough, SOB, sore throat, runny nose, generalized body aches, fatigue starting two days ago.

## 2021-07-03 NOTE — ED Provider Notes (Signed)
[place]   [id number] 07/03/21 Arrival Time: 1497  ASSESSMENT & PLAN:  1. SOB (shortness of breath)   2. Viral URI with cough   3. Wheezing    I have personally viewed the imaging studies ordered this visit. No acute changes on CXR. No PNA.  Discussed typical duration of viral illnesses. COVID testing sent. OTC symptom care as needed.  Begin: New Prescriptions   BENZONATATE (TESSALON) 100 MG CAPSULE    Take 1 capsule by mouth every 8 (eight) hours for cough.   PREDNISONE (DELTASONE) 20 MG TABLET    Take 2 tablets (40 mg total) by mouth daily.   Work note provided.   Follow-up Information     Marin Olp, MD.   Specialty: Family Medicine Why: As needed. Contact information: [place] [address] [facility] at [facility] .   Specialty: Urgent Care Why: If worsening or failing to improve as anticipated. Contact information: [address] [id number] [place] [address] [phone]                Reviewed expectations re: course of current medical issues. Questions answered. Outlined signs and symptoms indicating need for more acute intervention. Understanding verbalized. After Visit Summary given.   SUBJECTIVE: History from: Patient. Joshua Hahn is a 76 y.o. male. Reports: nasal cong, cough, SOB, ST, runny nose, body aches; abrupt onset 2 d ago. Denies: fever. Normal PO intake without n/v/d. Recent air travel.  OBJECTIVE:  Vitals:   07/03/21 1209  BP: (!) 161/69  Pulse: 64  Resp: 16  Temp: 97.8 F (36.6 C)  TempSrc: Oral  SpO2: 96%    General appearance: alert; no distress Eyes: PERRLA; EOMI; conjunctiva normal HENT: [place]; AT; with nasal congestion Neck: supple  Lungs: speaks full sentences without difficulty; unlabored; clear with very feint exp wheezes that clear with deep breaths Extremities: no edema Skin: warm and  dry Neurologic: normal gait Psychological: alert and cooperative; normal mood and affect   Imaging: DG Chest 2 View  Result Date: 07/03/2021 CLINICAL DATA:  Cough, shortness of breath EXAM: CHEST - 2 VIEW COMPARISON:  09/29/2020 FINDINGS: Cardiomegaly status post median sternotomy and CABG. Hiatal hernia. Pulmonary vascular prominence without overt edema. The visualized skeletal structures are unremarkable. IMPRESSION: Cardiomegaly and pulmonary vascular prominence without overt edema. No focal airspace opacity. Electronically Signed   By: Delanna Ahmadi M.D.   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 levels LDL IIIa+b% and HDL2b%   Hypertension    Myocardial infarct [facility])    Myocardial infarction ([place]) 1994   inferior wall [facility] hospital[facility] CABG   Prostate cancer [facility])    Sleep apnea    uses CPAP PRN   Sleep apnea, obstructive    using C-PAP   Social History   Socioeconomic History   Marital status: Married    Spouse name: Not on file   Number of children: 2   Years of education: Not on file   Highest education  level: Not on file  Occupational History   Occupation: Theme park manager -retired     Comment: [employer]   Tobacco Use   Smoking status: Former    Types: Cigarettes    Quit date: 05/07/1973    Years since quitting: 48.1   Smokeless tobacco: Never  Vaping Use   Vaping Use: Never used  Substance and Sexual Activity   Alcohol use: No   Drug use: No   Sexual activity: Not on file  Other Topics Concern   Not on file  Social History Narrative   ** Merged History Encounter **        Married 48 years in 2016. 2 children. 5 grand kids.   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   Hobbies: speaking events, traveling   Social Determinants of Health   Financial Resource Strain: Low Risk    Difficulty of Paying Living Expenses: Not hard at all  Food Insecurity: No Food Insecurity   Worried About Charity fundraiser in the Last Year: Never true   Arboriculturist in the Last Year: Never true  Transportation Needs: No Transportation Needs   Lack of Transportation (Medical): No   Lack of Transportation (Non-Medical): No  Physical Activity: Sufficiently Active   Days of Exercise per Week: 2 days   Minutes of Exercise per Session: 120 min  Stress: No Stress Concern Present   Feeling of Stress : Not at all  Social Connections: Socially Integrated   Frequency of Communication with Friends and Family: More than three times a week   Frequency of Social Gatherings with Friends and Family: More than three times a week   Attends Religious Services: More than 4 times per year   Active Member of [employer] or Organizations: Yes   Attends Music therapist: 1 to 4 times per year   Marital Status: Married  Human resources officer Violence: Not At Risk   Fear of Current or Ex-Partner: No   Emotionally Abused: No   Physically Abused: No   Sexually Abused: No   Family History  Problem Relation Age of Onset   Pancreatic cancer Father    Heart disease Father    Hypertension Father    Stroke Mother    Hypertension Mother    Coronary artery disease Brother 59       2 bros with early CAD   Hypertension Brother    Coronary artery disease Brother    Hypertension Brother    Coronary artery disease Brother    Hypertension Brother    Heart attack Maternal Grandfather    Hypertension Paternal Grandfather    Lupus Daughter    Hypertension Son    Hypertension Sister    Hypertension Sister    Throat cancer Brother    Colon cancer Neg Hx    Esophageal cancer Neg Hx    Stomach  cancer Neg Hx    Rectal cancer Neg Hx    Past Surgical History:  Procedure Laterality Date   ABDOMINAL SURGERY     CARDIAC CATHETERIZATION  06/23/2008   occlusion sequential OM1 andOM2 graft as well vein graft to RCA.LAD and diagonal system remain intact. RCA  collateralized   CORONARY ANGIOPLASTY WITH STENT PLACEMENT     CORONARY ARTERY BYPASS GRAFT  1994   EXPLORATORY LAPAROTOMY     ideopathic retroperitoneal fibrosis   LEFT HEART CATHETERIZATION WITH CORONARY/GRAFT ANGIOGRAM N/A 02/04/2013   Procedure: LEFT HEART CATHETERIZATION WITH Beatrix Fetters;  Surgeon: Marcello Moores  Floyce Stakes, MD;  Location: [facility] C[facility] LAB;  Service: Cardiovascular;  Laterality: N/A;   LEFT PAROTID GLAND SURGRY  [id number]   Dr. Erik Obey   PERCUTANEOUS CORONARY STENT INTERVENTION (PCI-S)  06/25/2008   rotational atherectomy w/diffuse disease native circ. w/drug eluting stents , PTCA on one vessel; grat occlusion to RCA w/native RCA proxim. occlusion. Circ.supplied collaaterals distal RCA  following intervention   PERCUTANEOUS CORONARY STENT INTERVENTION (PCI-S) N/A 02/05/2013   Procedure: PERCUTANEOUS CORONARY STENT INTERVENTION (PCI-S);  Surgeon: [name], MD;  Location: [facility] CATH LAB;  Service: Cardiovascular;  Laterality: N/A;   PROSTATECTOMY  06/2009   robotic-asssisted laparoscopic radical by Dr Pura Spice, MD 07/03/21 [phone]

## 2021-07-04 LAB — NOVEL CORONAVIRUS, NAA: SARS-CoV-2, NAA: NOT DETECTED

## 2021-07-13 ENCOUNTER — Encounter: Payer: Self-pay | Admitting: Nurse Practitioner

## 2021-07-20 ENCOUNTER — Encounter: Payer: Self-pay | Admitting: Family Medicine

## 2021-07-21 ENCOUNTER — Other Ambulatory Visit: Payer: Self-pay

## 2021-07-24 NOTE — Progress Notes (Signed)
? ? ?07/25/2021 ?Joshua Hahn ?[id number] ?August 10, 1945 ? ? ?CHIEF COMPLAINT: Schedule colonoscopy ? ?HISTORY OF PRESENT ILLNESS: Joshua Hahn is a 76 year old male with a past medical history of hypertension, hyperlipidemia, coronary artery disease s/p PTCA RCA 1988, s/p inferior MI 1994 s/p  5 vessel CABG, s/p DES 2010, s/p PTCA in stent stenosis to the cx and PTCA/DES of the ostium in the vein graft supplying the LAD 2014, sleep apnea, prostate cancer 2010, renal insufficiency and tubular adenomatous colon polyps. He presents to our office today as referred by Dr. Yong Channel to schedule a colonoscopy.  He denies having any abdominal pain, change in his bowel pattern or rectal bleeding.  His most recent colonoscopy by Dr. Ardis Hughs 12/03/2014 showed two 3 to 5 mm tubular adenomatous polyps removed from the descending and ascending colon and mild diverticulosis to the left colon.  He was advised to repeat a colonoscopy July 2023.  Joshua Hahn remains quite active at the age of 75.  He mows his 7 acre yard on a regular basis.  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 ? ?He denies having any GERD symptoms. ? ?He has a significant history of coronary artery disease as noted above.  He was last seen in office by his cardiologist Dr. Shelva Majestic on 11/28/2020.  At that time, his cardiac status was stable and he was advised to follow-up in the office in 2 to 3 months which was not done.  He remains on Plavix and ASA.  He denies having any chest pain, palpitations, dizziness or shortness of breath. ? ?He underwent a Lexiscan Myoview study on May 17, 2020 which showed evidence of a prior inferior scar with minimal peri-infarction ischemia and very mild inferolateral ischemia most likely due to his distal small marginal branch stenosis. ? ? ? ?CBC Latest Ref Rng & Units 11/29/2020 09/29/2020 04/28/2020  ?WBC 3.4 - 10.8 x10E3/uL 6.1 7.2 7.2  ?Hemoglobin 13.0 -  17.7 g/dL 14.3 14.0 15.1  ?Hematocrit 37.5 - 51.0 % 43.5 43.1 44.8  ?Platelets 150 - 450 x10E3/uL 179 169 189  ?  ?CMP Latest Ref Rng & Units 11/29/2020 09/29/2020 04/28/2020  ?Glucose 65 - 99 mg/dL 110(H) 117(H) 121(H)  ?BUN 8 - 27 mg/dL '15 17 14  '$ ?Creatinine 0.76 - 1.27 mg/dL 1.90(H) 1.96(H) 1.90(H)  ?Sodium 134 - 144 mmol/L 139 133(L) 138  ?Potassium 3.5 - 5.2 mmol/L 5.0 4.4 5.4(H)  ?Chloride 96 - 106 mmol/L 103 101 102  ?CO2 20 - 29 mmol/L '24 25 23  '$ ?Calcium 8.6 - 10.2 mg/dL 9.5 9.0 10.3(H)  ?Total Protein 6.0 - 8.5 g/dL 6.7 - 6.7  ?Total Bilirubin 0.0 - 1.2 mg/dL 0.6 - 0.7  ?Alkaline Phos 44 - 121 IU/L 74 - 84  ?AST 0 - 40 IU/L 36 - 29  ?ALT 0 - 44 IU/L 23 - 22  ?  ?Colonoscopy 12/03/2014 by Dr. Ardis Hughs: ?1. Two sessile polyps ranging between 3-73m in size were found in the descending colon and ascending colon; ?polypectomies were performed with a cold snare ?2. Mild diverticulosis was noted in the left colon ?3. The examination was otherwise normal ?- TUBULAR ADENOMA (X2). ?- NO HIGH GRADE DYSPLASIA OR MALIGNANCY. ?- Recall changed to July 2023 ? ?Colonoscopy 07/08/2003 by Dr. SRachelle Hora ?Sigmoid diverticulosis ?No polyps ? ?Past Medical History:  ?Diagnosis Date  ? Chronic kidney disease (CKD), stage III (moderate) (HCC)   ? Coronary artery disease  1994  ? PCI'80s, CABG '94, CFX DES 4/10  ? Diverticulosis   ? HTN (hypertension) 10/16/2011  ? Echo -EF 50-55% ,LV NORMAL  ? Hyperlipidemia   ? 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 levels LDL IIIa+b% and HDL2b%  ? Hypertension   ? Myocardial infarct [facility])   ? Myocardial infarction [facility]) 1994  ? [facility] wall [facility] at [facility]  ? Prostate cancer ([place])   ? Sleep apnea   ? uses CPAP PRN  ? Sleep apnea, obstructive   ? using C-PAP  ? ?Past Surgical History:  ?Procedure Laterality Date  ? ABDOMINAL SURGERY    ? CARDIAC CATHETERIZATION  06/23/2008  ? occlusion sequential OM1 andOM2 graft as well  vein graft to RCA.LAD and diagonal system remain intact. RCA  collateralized  ? CORONARY ANGIOPLASTY WITH STENT PLACEMENT    ? CORONARY ARTERY BYPASS GRAFT  1994  ? EXPLORATORY LAPAROTOMY    ? ideopathic retroperitoneal fibrosis  ? LEFT HEART CATHETERIZATION WITH CORONARY/GRAFT ANGIOGRAM N/A 02/04/2013  ? Procedure: LEFT HEART CATHETERIZATION WITH Beatrix Fetters;  Surgeon: [name], MD;  Location: [facility] CATH LAB;  Service: Cardiovascular;  Laterality: N/A;  ? LEFT PAROTID GLAND SURGRY  05/2009  ? Dr. Erik Obey  ? PERCUTANEOUS CORONARY STENT INTERVENTION (PCI-S)  06/25/2008  ? rotational atherectomy w/diffuse disease native circ. w/drug eluting stents , PTCA on one vessel; grat occlusion to RCA w/native RCA proxim. occlusion. Circ.supplied collaaterals distal RCA  following intervention  ? PERCUTANEOUS CORONARY STENT INTERVENTION (PCI-S) N/A 02/05/2013  ? Procedure: PERCUTANEOUS CORONARY STENT INTERVENTION (PCI-S);  Surgeon: [name], MD;  Location: [facility] CATH LAB;  Service: Cardiovascular;  Laterality: N/A;  ? PROSTATECTOMY  06/2009  ? 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  ? ?Social History: He is married.  He has 1 son and 1 daughter.  He is retired.  He quit smoking cigarettes about 48 years ago. ? ?Family History: family history includes Coronary artery disease in his brother and brother; Coronary artery disease (age of onset: 69) in his brother; Heart attack in his maternal grandfather; Heart disease in his father; Hypertension in his brother, brother, brother, father, mother, paternal grandfather, sister, sister, and son; Lupus in his daughter; Pancreatic cancer in his father; Stroke in his mother; Throat cancer in his brother.  No known family history of esophageal, gastric or colon cancer. ? ?No Known Allergies ? ?  ?Outpatient Encounter Medications as of 07/25/2021  ?Medication Sig  ? amLODipine (NORVASC) 5 MG  tablet Take 1 tablet (5 mg total) by mouth daily.  ? Ascorbic Acid (VITAMIN C PO) Take by mouth.  ? aspirin 81 MG tablet Take 81 mg by mouth daily.  ? cholecalciferol (VITAMIN D) 1000 UNITS tablet Take 1,000 Units by mouth daily.  ? clopidogrel (PLAVIX) 75 MG tablet TAKE 1 TABLET BY MOUTH EVERY DAY  ? Coenzyme Q10 (CO Q 10) 100 MG CAPS Take 300 mg by mouth every evening.  ? ezetimibe (ZETIA) 10 MG tablet TAKE 1 TABLET BY MOUTH EVERY DAY  ? isosorbide mononitrate (IMDUR) 60 MG 24 hr tablet TAKE 1 TABLET (60 MG TOTAL) BY MOUTH DAILY. NEED OV FOR FUTURE REFILL  ? metoprolol tartrate (LOPRESSOR) 25 MG tablet TAKE 1 TABLET (25 MG TOTAL) BY MOUTH EVERY MORNING AND 0.5 TABLETS (12.5 MG TOTAL) EVERY EVENING.NEED APPT WITH DR Claiborne Billings FOR FUTURE REFILLS  ? nitroGLYCERIN (NITROSTAT) 0.4 MG SL tablet  Place 1 tablet (0.4 mg total) under the tongue every 5 (five) minutes as needed for chest pain.  ? omega-3 acid ethyl esters (LOVAZA) 1 g capsule TAKE 1 CAPSULE BY MOUTH 2 TIMES DAILY.  ? ranolazine (RANEXA) 500 MG 12 hr tablet Take 1 tablet (500 mg total) by mouth 2 (two) times daily.  ? rosuvastatin (CRESTOR) 40 MG tablet Take 1 tablet (40 mg total) by mouth daily.  ? valsartan (DIOVAN) 80 MG tablet TAKE 1 TABLET BY MOUTH EVERY DAY  ? vitamin B-12 (CYANOCOBALAMIN) 100 MCG tablet Take 100 mcg by mouth daily.   ? ?No facility-administered encounter medications on file as of 07/25/2021.  ? ?REVIEW OF SYSTEMS:  ?Gen: Denies fever, sweats or chills. No weight loss.  ?CV: Denies chest pain, palpitations or edema. ?Resp: Denies cough, shortness of breath of hemoptysis.  ?GI: See HPI  ?GU : Denies urinary burning, blood in urine, increased urinary frequency or incontinence. ?MS: Denies joint pain, muscles aches or weakness. ?Derm: Denies rash, itchiness, skin lesions or unhealing ulcers. ?Psych: Denies depression, anxiety or memory loss. ?Heme: Denies bruising, easy bleeding. ?Neuro:  Denies headaches, dizziness or paresthesias. ?Endo:   Denies any problems with DM, thyroid or adrenal function. ? ?PHYSICAL EXAM: ?BP 126/70   Pulse 73   Ht '5\' 10"'$  (1.778 m)   Wt 223 lb (101.2 kg)   BMI 32.00 kg/m?  ? ?General: 76 year old male in no acute distres

## 2021-07-25 ENCOUNTER — Encounter: Payer: Self-pay | Admitting: Nurse Practitioner

## 2021-07-25 ENCOUNTER — Telehealth: Payer: Self-pay

## 2021-07-25 ENCOUNTER — Ambulatory Visit (INDEPENDENT_AMBULATORY_CARE_PROVIDER_SITE_OTHER): Payer: Medicare Other | Admitting: Nurse Practitioner

## 2021-07-25 VITALS — BP 126/70 | HR 73 | Ht 70.0 in | Wt 223.0 lb

## 2021-07-25 DIAGNOSIS — Z86010 Personal history of colonic polyps: Secondary | ICD-10-CM

## 2021-07-25 DIAGNOSIS — I25118 Atherosclerotic heart disease of native coronary artery with other forms of angina pectoris: Secondary | ICD-10-CM

## 2021-07-25 NOTE — Patient Instructions (Addendum)
Once we receive cardiac clearance and instructions on holding your Plavix, we will contact you to schedule the colonoscopy. ? ?Thank you for trusting me with your gastrointestinal care!   ? ?Noralyn Pick, CRNP ? ? ? ?BMI: ? ?If you are age 77 or older, your body mass index should be between 23-30. Your Body mass index is 32 kg/m?Marland Kitchen If this is out of the aforementioned range listed, please consider follow up with your Primary Care Provider. ? ?If you are age 94 or younger, your body mass index should be between 19-25. Your Body mass index is 32 kg/m?Marland Kitchen If this is out of the aformentioned range listed, please consider follow up with your Primary Care Provider.  ? ?MY CHART: ? ?The [facility] providers would like to encourage you to use [facility] to communicate with providers for non-urgent requests or questions.  Due to long hold times on the telephone, sending your provider a message by [facility] may be a faster and more efficient way to get a response.  Please allow 48 business hours for a response.  Please remember that this is for non-urgent requests.  ? ?

## 2021-07-25 NOTE — Telephone Encounter (Signed)
Request for surgical clearance:     Endoscopy Procedure ? ?What type of surgery is being performed?     Colonoscopy ? ?When is this surgery scheduled?     TBD ? ?What type of clearance is required ?   Pharmacy and Cardiac clearance ? ?Are there any medications that need to be held prior to surgery and how long? Plavix 5 day hold ? ?Practice name and name of physician performing surgery?      [facility] ? ?What is your office phone and fax number?      Phone- [phone]  Fax- [phone] ? ?Anesthesia type (None, local, MAC, general) ?       MAC ? ?

## 2021-07-26 NOTE — Telephone Encounter (Signed)
Joshua Hahn 77 year old male is requesting preoperative cardiac evaluation for colonoscopy.  He was last seen in the clinic on 11/28/2020.  During that time he remained stable from cardiac standpoint. ? ? ?His PMH includes CKD stage III, coronary artery disease PCI in 1980s, CABG 94, circumflex DES 4/10, diverticulosis, hypertension, hyperlipidemia, prostate cancer, sleep apnea on CPAP. ? ?May his Plavix be held prior to his procedure? ? ?Thank you for your help.  Please direct your response to CV DIV preop pool. ? ?Joshua Hahn. Joshua Rayford NP-C ? ?  ?07/26/2021, 10:38 AM ?[place] ?[address] ?Office [phone] Fax [phone] ? ? ?

## 2021-07-28 ENCOUNTER — Telehealth: Payer: Self-pay | Admitting: *Deleted

## 2021-07-28 NOTE — Telephone Encounter (Signed)
Ok to hold plavix for 5 days 

## 2021-07-28 NOTE — Telephone Encounter (Signed)
PT AGREEABLE TO PLAN OF CARE FOR TELE PRE OP APPT 08/02/21 @ 9 AM. MED REC AND CONSENT ARE DONE.  ? ?  ?Patient Consent for Virtual Visit  ? ? ?   ? ?Joshua Hahn has provided verbal consent on 07/28/2021 for a virtual visit (video or telephone). ? ? ?CONSENT FOR VIRTUAL VISIT FOR:  Joshua Hahn  ?By participating in this virtual visit I agree to the following: ? ?I hereby voluntarily request, consent and authorize [facility] and its employed or contracted physicians, physician assistants, nurse practitioners or other licensed health care professionals (the Practitioner), to provide me with telemedicine health care services (the ?Services") as deemed necessary by the treating Practitioner. I acknowledge and consent to receive the Services by the Practitioner via telemedicine. I understand that the telemedicine visit will involve communicating with the Practitioner through live audiovisual communication technology and the disclosure of certain medical information by electronic transmission. I acknowledge that I have been given the opportunity to request an in-person assessment or other available alternative prior to the telemedicine visit and am voluntarily participating in the telemedicine visit. ? ?I understand that I have the right to withhold or withdraw my consent to the use of telemedicine in the course of my care at any time, without affecting my right to future care or treatment, and that the Practitioner or I may terminate the telemedicine visit at any time. I understand that I have the right to inspect all information obtained and/or recorded in the course of the telemedicine visit and may receive copies of available information for a reasonable fee.  I understand that some of the potential risks of receiving the Services via telemedicine include:  ?Delay or interruption in medical evaluation due to technological equipment failure or disruption; ?Information transmitted may not be sufficient (e.g.  poor resolution of images) to allow for appropriate medical decision making by the Practitioner; and/or  ?In rare instances, security protocols could fail, causing a breach of personal health information. ? ?Furthermore, I acknowledge that it is my responsibility to provide information about my medical history, conditions and care that is complete and accurate to the best of my ability. I acknowledge that Practitioner's advice, recommendations, and/or decision may be based on factors not within their control, such as incomplete or inaccurate data provided by me or distortions of diagnostic images or specimens that may result from electronic transmissions. I understand that the practice of medicine is not an exact science and that Practitioner makes no warranties or guarantees regarding treatment outcomes. I acknowledge that a copy of this consent can be made available to me via my patient portal ([facility]), or I can request a printed copy by calling the office of [facility].   ? ?I understand that my insurance will be billed for this visit.  ? ?I have read or had this consent read to me. ?I understand the contents of this consent, which adequately explains the benefits and risks of the Services being provided via telemedicine.  ?I have been provided ample opportunity to ask questions regarding this consent and the Services and have had my questions answered to my satisfaction. ?I give my informed consent for the services to be provided through the use of telemedicine in my medical care ? ? ?  ?

## 2021-07-28 NOTE — Telephone Encounter (Signed)
PT AGREEABLE TO PLAN OF CARE FOR TELE PRE OP APPT 08/02/21 @ 9 AM. MED REC AND CONSENT ARE DONE ?

## 2021-07-28 NOTE — Telephone Encounter (Signed)
His Plavix may be held for 5 days prior to his procedure.  Please resume as soon as hemostasis is achieved ? ?Preoperative team, please contact this patient and set up a phone call appointment for further cardiac evaluation.  Thank you for your help. ? ?Jossie Ng. Mcdaniel Ohms NP-C ? ?  ?07/28/2021, 8:32 AM ?[place] ?[address] [address] ?Office [phone] Fax [phone] ? ?

## 2021-07-30 ENCOUNTER — Other Ambulatory Visit: Payer: Self-pay | Admitting: Cardiovascular Disease

## 2021-08-03 ENCOUNTER — Ambulatory Visit (INDEPENDENT_AMBULATORY_CARE_PROVIDER_SITE_OTHER): Payer: Medicare Other | Admitting: General Practice

## 2021-08-03 DIAGNOSIS — Z01810 Encounter for preprocedural cardiovascular examination: Secondary | ICD-10-CM | POA: Diagnosis not present

## 2021-08-03 NOTE — Progress Notes (Signed)
? ?Virtual Visit via Telephone Note  ? ?This visit type was conducted due to national recommendations for restrictions regarding the COVID-19 Pandemic (e.g. social distancing) in an effort to limit this patient's exposure and mitigate transmission in our community.  Due to his co-morbid illnesses, this patient is at least at moderate risk for complications without adequate follow up.  This format is felt to be most appropriate for this patient at this time.  The patient did not have access to video technology/had technical difficulties with video requiring transitioning to audio format only (telephone).  All issues noted in this document were discussed and addressed.  No physical exam could be performed with this format.  Please refer to the patient's chart for his  consent to telehealth for [facility]. ? ?Evaluation Performed:  Preoperative cardiovascular risk assessment ?_____________  ? ?Date:  08/03/2021  ? ?Patient ID:  Joshua Hahn, Joshua Hahn, Joshua Hahn, MRN [id number] ?Patient Location:  ?Home ?Provider location:   ?Office ? ?Primary Care Provider:  Marin Olp, MD ?Primary Cardiologist:  Shelva Majestic, MD ? ?Chief Complaint  ?  ?76 y.o. y/o male with a h/o CKD stage III, coronary artery disease with PCI in 1980s, CABG 94, circumflex DES 4/10, HLD, HTN prostate cancer, who is pending colonoscopy, and presents today for telephonic preoperative cardiovascular risk assessment. ? ?Past Medical History  ?  ?Past Medical History:  ?Diagnosis Date  ? Chronic kidney disease (CKD), stage III (moderate) (HCC)   ? Coronary artery disease 1994  ? PCI'80s, CABG '94, CFX DES 4/10  ? Diverticulosis   ? HTN (hypertension) 10/16/2011  ? Echo -EF 50-55% ,LV NORMAL  ? Hyperlipidemia   ? 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 levels LDL IIIa+b% and HDL2b%  ? Hypertension   ? Myocardial infarct [facility])   ? Myocardial infarction [facility]) 1994  ? inferior wall MI at [facility]  ? Prostate cancer ([place])   ? Sleep apnea   ? uses CPAP PRN  ? Sleep apnea, obstructive   ? using C-PAP  ? ?Past Surgical History:  ?Procedure Laterality Date  ? ABDOMINAL SURGERY    ? CARDIAC CATHETERIZATION  06/23/2008  ? occlusion sequential OM1 andOM2 graft as well vein graft to RCA.LAD and diagonal system remain intact. RCA  collateralized  ? CORONARY ANGIOPLASTY WITH STENT PLACEMENT    ? CORONARY ARTERY BYPASS GRAFT  1994  ? EXPLORATORY LAPAROTOMY    ? ideopathic retroperitoneal fibrosis  ? LEFT HEART CATHETERIZATION WITH CORONARY/GRAFT ANGIOGRAM N/A 02/04/2013  ? Procedure: LEFT HEART CATHETERIZATION WITH Beatrix Fetters;  Surgeon: [name], MD;  Location: [facility] CATH LAB;  Service: Cardiovascular;  Laterality: N/A;  ? LEFT PAROTID GLAND SURGRY  05/2009  ? Dr. Erik Obey  ? PERCUTANEOUS CORONARY STENT INTERVENTION (PCI-S)  06/25/2008  ? rotational atherectomy w/diffuse disease native circ. w/drug eluting stents , PTCA on one vessel; grat occlusion to RCA w/native RCA proxim. occlusion. Circ.supplied collaaterals distal RCA  following intervention  ? PERCUTANEOUS CORONARY STENT INTERVENTION (PCI-S) N/A 02/05/2013  ? Procedure: PERCUTANEOUS CORONARY STENT INTERVENTION (PCI-S);  Surgeon: [name], MD;  Location: [facility] CATH LAB;  Service: Cardiovascular;  Laterality: N/A;  ? PROSTATECTOMY  06/2009  ? robotic-asssisted laparoscopic radical by Dr Rosana Hoes  ? ? ?Allergies ? ?No Known Allergies ? ?History of Present Illness  ?  ?Joshua Hahn is a 76 y.o. male who presents via audio/video conferencing for a telehealth visit today.  Pt was  last seen in cardiology clinic on 11/28/2020 by Dr. Claiborne Billings.  At that time Joshua Hahn was doing well .  The patient is now pending colonoscopy.  Since his last visit, he has been stable from cardiac standpoint. ? ?Today he denies chest pain, shortness of breath, lower extremity edema, fatigue, palpitations, melena, hematuria, hemoptysis, diaphoresis,  weakness, presyncope, syncope, orthopnea, and PND. ? ? ? ?Home Medications  ?  ?Prior to Admission medications   ?Medication Sig Start Date End Date Taking? Authorizing Provider  ?amLODipine (NORVASC) 5 MG tablet Take 1 tablet (5 mg total) by mouth daily. 11/28/20   [name], MD  ?Ascorbic Acid (VITAMIN C PO) Take by mouth.    [provider]  ?aspirin 81 MG tablet Take 81 mg by mouth daily.    [provider]  ?cholecalciferol (VITAMIN D) 1000 UNITS tablet Take 1,000 Units by mouth daily.    [provider]  ?clopidogrel (PLAVIX) 75 MG tablet TAKE 1 TABLET BY MOUTH EVERY DAY 07/31/21   [name], MD  ?Coenzyme Q10 (CO Q 10) 100 MG CAPS Take 300 mg by mouth every evening.    [provider]  ?ezetimibe (ZETIA) 10 MG tablet TAKE 1 TABLET BY MOUTH EVERY DAY 06/21/21   [name], MD  ?isosorbide mononitrate (IMDUR) 60 MG Hahn hr tablet TAKE 1 TABLET (60 MG TOTAL) BY MOUTH DAILY. NEED OV FOR FUTURE REFILL 06/21/21   [name], MD  ?metoprolol tartrate (LOPRESSOR) 25 MG tablet TAKE 1 TABLET (25 MG TOTAL) BY MOUTH EVERY MORNING AND 0.5 TABLETS (12.5 MG TOTAL) EVERY EVENING.NEED APPT WITH DR Claiborne Billings FOR FUTURE REFILLS 06/21/21   [name], MD  ?nitroGLYCERIN (NITROSTAT) 0.4 MG SL tablet Place 1 tablet (0.4 mg total) under the tongue every 5 (five) minutes as needed for chest pain. 10/22/16   [name], MD  ?omega-3 acid ethyl esters (LOVAZA) 1 g capsule TAKE 1 CAPSULE BY MOUTH 2 TIMES DAILY. 06/21/21   [name], MD  ?ranolazine (RANEXA) 500 MG 12 hr tablet Take 1 tablet (500 mg total) by mouth 2 (two) times daily. 06/27/21   [name], MD  ?rosuvastatin (CRESTOR) 40 MG tablet TAKE 1 TABLET BY MOUTH EVERY DAY 07/31/21   [name], MD  ?valsartan (DIOVAN) 80 MG tablet TAKE 1 TABLET BY MOUTH EVERY DAY 06/21/21   [name], MD  ?vitamin B-12 (CYANOCOBALAMIN) 100 MCG tablet Take 100 mcg by mouth daily.     [provider]  ? ? ?Physical  Exam  ?  ?Vital Signs:  TAMAJ JURGENS does not have vital signs available for review today. ? ?Given telephonic nature of communication, physical exam is limited. ?AAOx3. NAD. Normal affect.  Speech and respirations are unlabored. ? ?Accessory Clinical Findings  ?  ?None ? ?Assessment & Plan  ?  ?1.  Preoperative Cardiovascular Risk Assessment: ? ?Primary Cardiologist: Shelva Majestic, MD ? ?Chart reviewed as part of pre-operative protocol coverage. Given past medical history and time since last visit, based on ACC/AHA guidelines, DAYVIN ABER would be at acceptable risk for the planned procedure without further cardiovascular testing.  ? ?Patient was advised that if he develops new symptoms prior to surgery to contact our office to arrange a follow-up appointment.  He verbalized understanding. ? ?His Plavix may be held for 5 days prior to his procedure.  Please resume as soon as hemostasis is achieved. ? ? ?A copy of this note  will be routed to requesting surgeon. ? ?Time:   ?Today, I have spent 5 minutes with the patient with telehealth technology discussing medical history, symptoms, and management plan.  I spent greater than 10 minutes prior to the phone call reviewing patient's past medical history and medications. ? ? ?Deberah Pelton, NP ? ?08/03/2021, 8:09 AM ? ?

## 2021-08-06 ENCOUNTER — Other Ambulatory Visit: Payer: Self-pay | Admitting: Cardiovascular Disease

## 2021-08-07 NOTE — Telephone Encounter (Signed)
On 06/21/2021 refills for a 90 day supply was giving for 4 medications to be refilled. And on 07/31/2021 90 day supply giving for 2 other medications. ?

## 2021-08-08 ENCOUNTER — Telehealth: Payer: Medicare Other

## 2021-08-11 ENCOUNTER — Telehealth: Payer: Self-pay | Admitting: Cardiovascular Disease

## 2021-08-11 ENCOUNTER — Other Ambulatory Visit: Payer: Self-pay

## 2021-08-11 MED ORDER — EZETIMIBE 10 MG PO TABS: 10.0000 mg | ORAL_TABLET | Freq: Every day | ORAL | 3 refills | Status: DC

## 2021-08-11 NOTE — Progress Notes (Signed)
Medication sent to pharmacy  

## 2021-08-11 NOTE — Telephone Encounter (Signed)
?*  STAT* If patient is at the pharmacy, call can be transferred to refill team. ? ? ?1. Which medications need to be refilled? (please list name of each medication and dose if known) ezetimibe (ZETIA) 10 MG tablet ? ?2. Which pharmacy/location (including street and city if local pharmacy) is medication to be sent to? ?[id number] [place],  - [address] ? ? ?3. Do they need a 30 day or 90 day supply? 90 ds ? ?

## 2021-08-14 ENCOUNTER — Encounter (HOSPITAL_BASED_OUTPATIENT_CLINIC_OR_DEPARTMENT_OTHER): Payer: Self-pay

## 2021-08-14 ENCOUNTER — Emergency Department (HOSPITAL_BASED_OUTPATIENT_CLINIC_OR_DEPARTMENT_OTHER)
Admission: EM | Admit: 2021-08-14 | Discharge: 2021-08-14 | Disposition: A | Discharge status: Home / Self Care | Payer: Medicare Other | Attending: Emergency Medicine | Admitting: Emergency Medicine

## 2021-08-14 ENCOUNTER — Other Ambulatory Visit: Payer: Self-pay

## 2021-08-14 DIAGNOSIS — R040 Epistaxis: Secondary | ICD-10-CM | POA: Diagnosis not present

## 2021-08-14 DIAGNOSIS — Z7902 Long term (current) use of antithrombotics/antiplatelets: Secondary | ICD-10-CM | POA: Diagnosis not present

## 2021-08-14 DIAGNOSIS — Z7982 Long term (current) use of aspirin: Secondary | ICD-10-CM | POA: Diagnosis not present

## 2021-08-14 MED ORDER — OXYMETAZOLINE HCL 0.05 % NA SOLN
1.0000 | Freq: Once | NASAL | Status: AC
  Administered 2021-08-14: 1 via NASAL
  Filled 2021-08-14: qty 30

## 2021-08-14 NOTE — ED Triage Notes (Signed)
Patient presents with complaint of intermittent nose bleed for 2 days.  Bleeding has stopped in triage.   ?

## 2021-08-14 NOTE — ED Provider Notes (Signed)
? ?[facility]  ?Provider Note ? ?CSN: [id number] ?Arrival date & time: 08/14/21 1956 ? ?History ?Chief Complaint  ?Patient presents with  ? Epistaxis  ? ? ?Joshua Hahn is a 76 y.o. male on plavix for his heart reports intermittent L sided nose bleeding since yesterday. Drips out the front and down the back of his throat along with some clots. Bleeding has since stopped. No injuries to nose. He does wear CPAP at night.  ? ? ?Home Medications ?Prior to Admission medications   ?Medication Sig Start Date End Date Taking? Authorizing Provider  ?amLODipine (NORVASC) 5 MG tablet Take 1 tablet (5 mg total) by mouth daily. 11/28/20   [name], MD  ?Ascorbic Acid (VITAMIN C PO) Take by mouth.    [provider]  ?aspirin 81 MG tablet Take 81 mg by mouth daily.    [provider]  ?cholecalciferol (VITAMIN D) 1000 UNITS tablet Take 1,000 Units by mouth daily.    [provider]  ?clopidogrel (PLAVIX) 75 MG tablet TAKE 1 TABLET BY MOUTH EVERY DAY 07/31/21   [name], MD  ?Coenzyme Q10 (CO Q 10) 100 MG CAPS Take 300 mg by mouth every evening.    [provider]  ?ezetimibe (ZETIA) 10 MG tablet Take 1 tablet (10 mg total) by mouth daily. 08/11/21   [name], MD  ?isosorbide mononitrate (IMDUR) 60 MG 24 hr tablet TAKE 1 TABLET (60 MG TOTAL) BY MOUTH DAILY. NEED OV FOR FUTURE REFILL 06/21/21   [name], MD  ?metoprolol tartrate (LOPRESSOR) 25 MG tablet TAKE 1 TABLET (25 MG TOTAL) BY MOUTH EVERY MORNING AND 0.5 TABLETS (12.5 MG TOTAL) EVERY EVENING.NEED APPT WITH DR Claiborne Billings FOR FUTURE REFILLS 06/21/21   [name], MD  ?nitroGLYCERIN (NITROSTAT) 0.4 MG SL tablet Place 1 tablet (0.4 mg total) under the tongue every 5 (five) minutes as needed for chest pain. 10/22/16   [name], MD  ?omega-3 acid ethyl esters (LOVAZA) 1 g capsule TAKE 1 CAPSULE BY MOUTH 2 TIMES DAILY. 06/21/21   [name], MD  ?ranolazine (RANEXA) 500 MG 12 hr  tablet Take 1 tablet (500 mg total) by mouth 2 (two) times daily. 06/27/21   [name], MD  ?rosuvastatin (CRESTOR) 40 MG tablet TAKE 1 TABLET BY MOUTH EVERY DAY 07/31/21   [name], MD  ?valsartan (DIOVAN) 80 MG tablet TAKE 1 TABLET BY MOUTH EVERY DAY 06/21/21   [name], MD  ?vitamin B-12 (CYANOCOBALAMIN) 100 MCG tablet Take 100 mcg by mouth daily.     [provider]  ? ? ? ?Allergies    ?Patient has no known allergies. ? ? ?Review of Systems   ?Review of Systems ?Please see HPI for pertinent positives and negatives ? ?Physical Exam ?BP (!) 154/71 (BP Location: Right Arm)   Pulse (!) 58   Temp 98 ?F (36.7 ?C) (Oral)   Resp 16   Ht '5\' 10"'$  (1.778 m)   Wt 95.3 kg   SpO2 96%   BMI 30.13 kg/m?  ? ?Physical Exam ?Vitals and nursing note reviewed.  ?HENT:  ?   Head: Normocephalic.  ?   Nose: Nose normal.  ?   Comments: No active bleeding or signs of recent bleeding in L naris.  ?Eyes:  ?   Extraocular Movements: Extraocular movements intact.  ?Pulmonary:  ?   Effort: Pulmonary effort is normal.  ?Musculoskeletal:     ?  General: Normal range of motion.  ?   Cervical back: Neck supple.  ?Skin: ?   Findings: No rash (on exposed skin).  ?Neurological:  ?   Mental Status: He is alert and oriented to person, place, and time.  ?Psychiatric:     ?   Mood and Affect: Mood normal.  ? ? ?ED Results / Procedures / Treatments   ?EKG ?None ? ?Procedures ?Procedures ? ?Medications Ordered in the ED ?Medications  ?oxymetazoline (AFRIN) 0.05 % nasal spray 1 spray (has no administration in time range)  ? ? ?Initial Impression and Plan ? Patient with recent nosebleed has since stopped. No obvious source of bleeding on exam. Recommend Afrin and pressure if bleeding returns. If that does not control bleeding then RTED for potential packing. ENT referral for follow up. Advised saline drops to prevent drying in the meantime.  ? ?ED Course  ? ?  ? ? ?MDM Rules/Calculators/A&P ?Medical Decision  Making ?Problems Addressed: ?Epistaxis: acute illness or injury ? ?Risk ?OTC drugs. ? ? ? ?Final Clinical Impression(s) / ED Diagnoses ?Final diagnoses:  ?Epistaxis  ? ? ?Rx / DC Orders ?ED Discharge Orders   ? ? None  ? ?  ? ?  ?Truddie Hidden, MD ?08/14/21 2331 ? ?

## 2021-08-14 NOTE — ED Notes (Signed)
Patient discharged to home.  All discharge instructions reviewed.  Patient verbalized understanding via teachback method.  VS WDL.  Respirations even and unlabored.  Ambulatory out of ED.   °

## 2021-08-22 NOTE — Telephone Encounter (Signed)
Per Cardiology visit: ? ?Chart reviewed as part of pre-operative protocol coverage. Given past medical history and time since last visit, based on ACC/AHA guidelines, Joshua Hahn would be at acceptable risk for the planned procedure without further cardiovascular testing.  ?  ?Patient was advised that if he develops new symptoms prior to surgery to contact our office to arrange a follow-up appointment.  He verbalized understanding. ?  ?His Plavix may be held for 5 days prior to his procedure.  Please resume as soon as hemostasis is achieved. ?  ?Patient was notified by cardiology to hold his Plavix 5 days prior to procedure. ?

## 2021-08-30 ENCOUNTER — Telehealth: Payer: Self-pay | Admitting: Pharmacist

## 2021-08-30 ENCOUNTER — Other Ambulatory Visit: Payer: Self-pay | Admitting: Cardiovascular Disease

## 2021-08-30 NOTE — Progress Notes (Signed)
? ? ?Chronic Care Management ?Pharmacy Assistant  ? ?Name: Joshua Hahn  MRN: [id number] DOB: 1945/09/02 ? ? ?Reason for Encounter: Hypertension Adherence Call ?  ? ?Recent office visits:  ?None since last adherence call ? ?Recent consult visits:  ?08/03/2021 OV (Cardiology) Deberah Pelton, NP; no medication changes indicated. ? ?07/25/2021 OV (Gastroenterology) Noralyn Pick, NP; no medication changes indicated. ? ?Hospital visits:  ?08/14/2021 ED visit for Epistaxis ?-Recommend Afrin and pressure if bleeding returns. If that does not control bleeding then RTED for potential packing. ENT referral for follow up. Advised saline drops to prevent drying in the meantime.  ? ?07/03/2021 ED visit for SOB ?-OTC symptom care as needed ? ?Medications: ?Outpatient Encounter Medications as of 08/30/2021  ?Medication Sig  ? amLODipine (NORVASC) 5 MG tablet Take 1 tablet (5 mg total) by mouth daily.  ? Ascorbic Acid (VITAMIN C PO) Take by mouth.  ? aspirin 81 MG tablet Take 81 mg by mouth daily.  ? cholecalciferol (VITAMIN D) 1000 UNITS tablet Take 1,000 Units by mouth daily.  ? clopidogrel (PLAVIX) 75 MG tablet TAKE 1 TABLET BY MOUTH EVERY DAY  ? Coenzyme Q10 (CO Q 10) 100 MG CAPS Take 300 mg by mouth every evening.  ? ezetimibe (ZETIA) 10 MG tablet Take 1 tablet (10 mg total) by mouth daily.  ? isosorbide mononitrate (IMDUR) 60 MG 24 hr tablet TAKE 1 TABLET (60 MG TOTAL) BY MOUTH DAILY. NEED OV FOR FUTURE REFILL  ? metoprolol tartrate (LOPRESSOR) 25 MG tablet TAKE 1 TABLET BY MOUTH EVERY MORNING AND ONE HALF TABLETS EVERY EVENING.NEED APPT WITH FOR REFILLS  ? nitroGLYCERIN (NITROSTAT) 0.4 MG SL tablet Place 1 tablet (0.4 mg total) under the tongue every 5 (five) minutes as needed for chest pain.  ? omega-3 acid ethyl esters (LOVAZA) 1 g capsule TAKE 1 CAPSULE BY MOUTH 2 TIMES DAILY.  ? ranolazine (RANEXA) 500 MG 12 hr tablet Take 1 tablet (500 mg total) by mouth 2 (two) times daily.  ? rosuvastatin (CRESTOR) 40  MG tablet TAKE 1 TABLET BY MOUTH EVERY DAY  ? valsartan (DIOVAN) 80 MG tablet TAKE 1 TABLET BY MOUTH EVERY DAY  ? vitamin B-12 (CYANOCOBALAMIN) 100 MCG tablet Take 100 mcg by mouth daily.   ? [DISCONTINUED] isosorbide mononitrate (IMDUR) 60 MG 24 hr tablet TAKE 1 TABLET (60 MG TOTAL) BY MOUTH DAILY. NEED OV FOR FUTURE REFILL  ? [DISCONTINUED] metoprolol tartrate (LOPRESSOR) 25 MG tablet TAKE 1 TABLET (25 MG TOTAL) BY MOUTH EVERY MORNING AND 0.5 TABLETS (12.5 MG TOTAL) EVERY EVENING.NEED APPT WITH DR Claiborne Billings FOR FUTURE REFILLS  ? [DISCONTINUED] valsartan (DIOVAN) 80 MG tablet TAKE 1 TABLET BY MOUTH EVERY DAY  ? ?No facility-administered encounter medications on file as of 08/30/2021.  ? ?Reviewed chart prior to disease state call. Spoke with patient regarding BP ? ?Recent Office Vitals: ?BP Readings from Last 3 Encounters:  ?08/14/21 (!) 154/71  ?07/25/21 126/70  ?07/03/21 (!) 161/69  ? ?Pulse Readings from Last 3 Encounters:  ?08/14/21 (!) 58  ?07/25/21 73  ?07/03/21 64  ?  ?Wt Readings from Last 3 Encounters:  ?08/14/21 210 lb (95.3 kg)  ?07/25/21 223 lb (101.2 kg)  ?11/28/20 200 lb (90.7 kg)  ?  ? ?Kidney Function ?Lab Results  ?Component Value Date/Time  ? CREATININE 1.90 (H) 11/29/2020 10:06 AM  ? CREATININE 1.96 (H) 09/29/2020 06:48 PM  ? CREATININE 1.46 (H) 08/17/2015 11:58 AM  ? CREATININE 1.62 (H) 11/22/2014 11:17 AM  ? GFR 52.10 (  L) 01/09/2018 12:29 PM  ? GFRNONAA 35 (L) 09/29/2020 06:48 PM  ? GFRAA 39 (L) 04/28/2020 09:46 AM  ? ? ? ?  Latest Ref Rng & Units 11/29/2020  ? 10:06 AM 09/29/2020  ?  6:48 PM 04/28/2020  ?  9:46 AM  ?BMP  ?Glucose 65 - 99 mg/dL 110   117   121    ?BUN 8 - 27 mg/dL '15   17   14    '$ ?Creatinine 0.76 - 1.27 mg/dL 1.90   1.96   1.90    ?BUN/Creat Ratio 10 - '24 8    7    '$ ?Sodium 134 - 144 mmol/L 139   133   138    ?Potassium 3.5 - 5.2 mmol/L 5.0   4.4   5.4    ?Chloride 96 - 106 mmol/L 103   101   102    ?CO2 20 - 29 mmol/L '24   25   23    '$ ?Calcium 8.6 - 10.2 mg/dL 9.5   9.0   10.3     ? ? ?Current antihypertensive regimen:  ?Metoprolol 25 mg ?Valsartan 80 mg ?Amlodipine 5 mg ? ?How often are you checking your Blood Pressure? 3-5x per week ? ?Current home BP readings: 118-131/69 ? ?What recent interventions/DTPs have been made by any provider to improve Blood Pressure control since last CPP Visit: No recent interventions or DTPs. ? ?Any recent hospitalizations or ED visits since last visit with CPP? No ? ?What diet changes have been made to improve Blood Pressure Control?  ?No recent diet changes.  ? ?What exercise is being done to improve your Blood Pressure Control? ?Patient states he exercises regularly. ? ?Adherence Review: ?Is the patient currently on ACE/ARB medication? Yes ?Does the patient have >5 day gap between last estimated fill dates? No ? ? ?Care Gaps: ?Medicare Annual Wellness: Completed 03/23/2021 ?Hemoglobin A1C: 7.8% on 04/28/2020 ?Colonoscopy: Completed 11/30/2014 ? ?Future Appointments  ?Date Time Provider [place]  ?04/05/2022 11:00 AM [facility] HEALTH COACH [facility]  ?04/09/2022  2:45 PM [facility] CCM PHARMACIST [facility]  ? ?Star Rating Drugs: ?Rosuvastatin 40 mg last filled 07/31/2021 90 DS ?Valsartan 80 mg last filled 06/21/2021 90 DS ? ?April D Calhoun, [place] ?Clinical Pharmacist Assistant ?[phone] ?

## 2021-09-01 DIAGNOSIS — Z20822 Contact with and (suspected) exposure to covid-19: Secondary | ICD-10-CM | POA: Diagnosis not present

## 2021-09-08 DIAGNOSIS — Z20822 Contact with and (suspected) exposure to covid-19: Secondary | ICD-10-CM | POA: Diagnosis not present

## 2021-10-31 ENCOUNTER — Other Ambulatory Visit: Payer: Self-pay | Admitting: Cardiovascular Disease

## 2021-11-21 ENCOUNTER — Other Ambulatory Visit: Payer: Self-pay | Admitting: Cardiovascular Disease

## 2021-11-25 ENCOUNTER — Other Ambulatory Visit: Payer: Self-pay | Admitting: Cardiovascular Disease

## 2021-12-05 DIAGNOSIS — H903 Sensorineural hearing loss, bilateral: Secondary | ICD-10-CM | POA: Diagnosis not present

## 2021-12-05 DIAGNOSIS — H90A22 Sensorineural hearing loss, unilateral, left ear, with restricted hearing on the contralateral side: Secondary | ICD-10-CM | POA: Diagnosis not present

## 2021-12-05 DIAGNOSIS — G4733 Obstructive sleep apnea (adult) (pediatric): Secondary | ICD-10-CM | POA: Diagnosis not present

## 2021-12-05 DIAGNOSIS — R040 Epistaxis: Secondary | ICD-10-CM | POA: Diagnosis not present

## 2021-12-06 DIAGNOSIS — H9190 Unspecified hearing loss, unspecified ear: Secondary | ICD-10-CM | POA: Diagnosis not present

## 2021-12-06 DIAGNOSIS — I6782 Cerebral ischemia: Secondary | ICD-10-CM | POA: Diagnosis not present

## 2021-12-06 DIAGNOSIS — H905 Unspecified sensorineural hearing loss: Secondary | ICD-10-CM | POA: Diagnosis not present

## 2021-12-06 DIAGNOSIS — H903 Sensorineural hearing loss, bilateral: Secondary | ICD-10-CM | POA: Diagnosis not present

## 2021-12-07 ENCOUNTER — Other Ambulatory Visit: Payer: Self-pay | Admitting: Cardiovascular Disease

## 2021-12-07 DIAGNOSIS — H401132 Primary open-angle glaucoma, bilateral, moderate stage: Secondary | ICD-10-CM | POA: Diagnosis not present

## 2021-12-20 ENCOUNTER — Other Ambulatory Visit: Payer: Self-pay | Admitting: Cardiovascular Disease

## 2021-12-21 ENCOUNTER — Encounter: Payer: Self-pay | Admitting: Family Medicine

## 2021-12-23 ENCOUNTER — Other Ambulatory Visit: Payer: Self-pay | Admitting: Cardiovascular Disease

## 2022-01-08 ENCOUNTER — Other Ambulatory Visit: Payer: Self-pay | Admitting: Cardiovascular Disease

## 2022-01-18 ENCOUNTER — Other Ambulatory Visit: Payer: Self-pay | Admitting: Cardiovascular Disease

## 2022-01-25 ENCOUNTER — Telehealth: Payer: Self-pay | Admitting: Pharmacist

## 2022-01-25 NOTE — Progress Notes (Signed)
Chronic Care Management Pharmacy Assistant   Name: Joshua Hahn  MRN: [id number] DOB: [birth year]   Reason for Encounter: Hypertension Adherence Call    Recent office visits:  None  Recent consult visits:  None  Hospital visits:  None in previous 6 months  Medications: Outpatient Encounter Medications as of 01/25/2022  Medication Sig   amLODipine (NORVASC) 5 MG tablet TAKE 1 TABLET (5 MG TOTAL) BY MOUTH DAILY.   Ascorbic Acid (VITAMIN C PO) Take by mouth.   aspirin 81 MG tablet Take 81 mg by mouth daily.   cholecalciferol (VITAMIN D) 1000 UNITS tablet Take 1,000 Units by mouth daily.   clopidogrel (PLAVIX) 75 MG tablet TAKE 1 TABLET BY MOUTH EVERY DAY   Coenzyme Q10 (CO Q 10) 100 MG CAPS Take 300 mg by mouth every evening.   ezetimibe (ZETIA) 10 MG tablet Take 1 tablet (10 mg total) by mouth daily.   isosorbide mononitrate (IMDUR) 60 MG 24 hr tablet TAKE 1 TABLET (60 MG TOTAL) BY MOUTH DAILY. NEED OV FOR FUTURE REFILL   metoprolol tartrate (LOPRESSOR) 25 MG tablet TAKE 1 TABLET BY MOUTH EVERY MORNING AND ONE HALF TABLETS EVERY EVENING.NEED APPT WITH FOR REFILLS   nitroGLYCERIN (NITROSTAT) 0.4 MG SL tablet Place 1 tablet (0.4 mg total) under the tongue every 5 (five) minutes as needed for chest pain.   omega-3 acid ethyl esters (LOVAZA) 1 g capsule TAKE 1 CAPSULE BY MOUTH 2 TIMES DAILY.   ranolazine (RANEXA) 500 MG 12 hr tablet TAKE 1 TABLET BY MOUTH TWICE A DAY   rosuvastatin (CRESTOR) 40 MG tablet TAKE 1 TABLET BY MOUTH EVERY DAY   valsartan (DIOVAN) 80 MG tablet TAKE 1 TABLET BY MOUTH EVERY DAY   vitamin B-12 (CYANOCOBALAMIN) 100 MCG tablet Take 100 mcg by mouth daily.    No facility-administered encounter medications on file as of 01/25/2022.   Reviewed chart prior to disease state call. Spoke with patient regarding BP  Recent Office Vitals: BP Readings from Last 3 Encounters:  08/14/21 (!) 154/71  07/25/21 126/70  07/03/21 (!) 161/69   Pulse Readings from Last 3  Encounters:  08/14/21 (!) 58  07/25/21 73  07/03/21 64    Wt Readings from Last 3 Encounters:  08/14/21 210 lb (95.3 kg)  07/25/21 223 lb (101.2 kg)  11/28/20 200 lb (90.7 kg)     Kidney Function Lab Results  Component Value Date/Time   CREATININE 1.90 (H) 11/29/2020 10:06 AM   CREATININE 1.96 (H) 09/29/2020 06:48 PM   CREATININE 1.46 (H) 08/17/2015 11:58 AM   CREATININE 1.62 (H) 11/22/2014 11:17 AM   GFR 52.10 (L) 01/09/2018 12:29 PM   GFRNONAA 35 (L) 09/29/2020 06:48 PM   GFRAA 39 (L) 04/28/2020 09:46 AM       Latest Ref Rng & Units 11/29/2020   10:06 AM 09/29/2020    6:48 PM 04/28/2020    9:46 AM  BMP  Glucose 65 - 99 mg/dL 110  117  121   BUN 8 - 27 mg/dL '15  17  14   '$ Creatinine 0.76 - 1.27 mg/dL 1.90  1.96  1.90   BUN/Creat Ratio 10 - '24 8   7   '$ Sodium 134 - 144 mmol/L 139  133  138   Potassium 3.5 - 5.2 mmol/L 5.0  4.4  5.4   Chloride 96 - 106 mmol/L 103  101  102   CO2 20 - 29 mmol/L '24  25  23   '$ Calcium 8.6 -  10.2 mg/dL 9.5  9.0  10.3     Current antihypertensive regimen:  Amlodipine 5 mg daily Metoprolol Tartrate 25 mg every morning, 1/2 tablet every evening - Patient states he has only been taking 25 mg per day and not the 1/2 tablet in the evening Valsartan 80 mg daily  How often are you checking your Blood Pressure? 1-2x per week  Current home BP readings: 131/72  What recent interventions/DTPs have been made by any provider to improve Blood Pressure control since last CPP Visit: No recent interventions or DTPs.  Any recent hospitalizations or ED visits since last visit with CPP? No  What diet changes have been made to improve Blood Pressure Control?  Patient states he eats what his wife cooks. They are vegetarian, so he eats healthy per patient.  What exercise is being done to improve your Blood Pressure Control?  Patient states he works out 4-5 days a week. He also has a Physiological scientist on Monday and Wednesday.  Adherence Review: Is the patient  currently on ACE/ARB medication? Yes Does the patient have >5 day gap between last estimated fill dates? No  Care Gaps: Medicare Annual Wellness: Completed 03/23/2021 Hemoglobin A1C: 7.8% on 04/28/2020 Colonoscopy: Completed 11/30/2014  Future Appointments  Date Time Provider [place]  04/05/2022 11:00 AM [facility] HEALTH COACH [facility]  04/09/2022  2:45 PM [facility] CCM PHARMACIST [facility]   Star Rating Drugs: Rosuvastatin 40 mg last filled 01/19/2022 90 DS Valsartan 80 mg last filled 01/12/2022 90 DS  April D Calhoun, [place] Pharmacist Assistant [phone]

## 2022-01-29 ENCOUNTER — Encounter: Payer: Self-pay | Admitting: *Deleted

## 2022-02-06 DIAGNOSIS — N183 Chronic kidney disease, stage 3 unspecified: Secondary | ICD-10-CM | POA: Diagnosis not present

## 2022-02-06 DIAGNOSIS — I129 Hypertensive chronic kidney disease with stage 1 through stage 4 chronic kidney disease, or unspecified chronic kidney disease: Secondary | ICD-10-CM | POA: Diagnosis not present

## 2022-02-06 DIAGNOSIS — I2510 Atherosclerotic heart disease of native coronary artery without angina pectoris: Secondary | ICD-10-CM | POA: Diagnosis not present

## 2022-02-06 DIAGNOSIS — C61 Malignant neoplasm of prostate: Secondary | ICD-10-CM | POA: Diagnosis not present

## 2022-02-06 LAB — BASIC METABOLIC PANEL
BUN: 19 (ref 4–21)
CO2: 27 — AB (ref 13–22)
Chloride: 104 (ref 99–108)
Creatinine: 1.8 — AB (ref 0.6–1.3)
Glucose: 140
Potassium: 4.6 mEq/L (ref 3.5–5.1)
Sodium: 137 (ref 137–147)

## 2022-02-06 LAB — COMPREHENSIVE METABOLIC PANEL
Albumin: 4.2 (ref 3.5–5.0)
Calcium: 10.2 (ref 8.7–10.7)
eGFR: 37

## 2022-02-06 LAB — CBC AND DIFFERENTIAL: Hemoglobin: 14.2 (ref 13.5–17.5)

## 2022-02-23 ENCOUNTER — Other Ambulatory Visit: Payer: Self-pay | Admitting: Cardiovascular Disease

## 2022-03-26 ENCOUNTER — Telehealth: Payer: Self-pay | Admitting: *Deleted

## 2022-03-26 NOTE — Patient Outreach (Signed)
  Care Coordination   03/26/2022 Name: Joshua Hahn MRN: [id number] DOB: 05-15-1945   Care Coordination Outreach Attempts:  An unsuccessful telephone outreach was attempted today to offer the patient information about available care coordination services as a benefit of their health plan.   Follow Up Plan:  Additional outreach attempts will be made to offer the patient care coordination information and services.   Encounter Outcome:  No Answer  Care Coordination Interventions Activated:  No   Care Coordination Interventions:  No, not indicated    Raina Mina, RN Care Management Coordinator [place] Office [phone]

## 2022-04-05 ENCOUNTER — Ambulatory Visit (INDEPENDENT_AMBULATORY_CARE_PROVIDER_SITE_OTHER): Payer: Medicare Other

## 2022-04-05 VITALS — Wt 210.0 lb

## 2022-04-05 DIAGNOSIS — Z1211 Encounter for screening for malignant neoplasm of colon: Secondary | ICD-10-CM | POA: Diagnosis not present

## 2022-04-05 DIAGNOSIS — Z86010 Personal history of colonic polyps: Secondary | ICD-10-CM

## 2022-04-05 DIAGNOSIS — Z0000 Encounter for general adult medical examination without abnormal findings: Secondary | ICD-10-CM

## 2022-04-05 NOTE — Patient Instructions (Signed)
Joshua Hahn , Thank you for taking time to come for your Medicare Wellness Visit. I appreciate your ongoing commitment to your health goals. Please review the following plan we discussed and let me know if I can assist you in the future.   These are the goals we discussed:  Goals      Increase physical activity     Maintain home gym exercise program, looking to join local [employer] for social interaction also     Patient Stated     Lose a few pounds      Track and Manage My Blood Pressure-Hypertension     Timeframe:  Long-Range Goal Priority:  High Start Date: 02/06/21                            Expected End Date:   08/07/20                    Follow Up Date 05/09/20    - check blood pressure weekly - choose a place to take my blood pressure (home, clinic or office, retail store) - write blood pressure results in a log or diary    Why is this important?   You won't feel high blood pressure, but it can still hurt your blood vessels.  High blood pressure can cause heart or kidney problems. It can also cause a stroke.  Making lifestyle changes like losing a little weight or eating less salt will help.  Checking your blood pressure at home and at different times of the day can help to control blood pressure.  If the doctor prescribes medicine remember to take it the way the doctor ordered.  Call the office if you cannot afford the medicine or if there are questions about it.     Notes:         This is a list of the screening recommended for you and due dates:  Health Maintenance  Topic Date Due   Colon Cancer Screening  12/03/2019   Flu Shot  12/05/2021   COVID-19 Vaccine (5 - 2023-24 season) 01/05/2022   Medicare Annual Wellness Visit  03/23/2022   DTaP/Tdap/Td vaccine (3 - Tdap) 10/07/2024   Pneumonia Vaccine  Completed   Hepatitis C Screening: USPSTF Recommendation to screen - Ages 18-79 yo.  Completed   Zoster (Shingles) Vaccine  Completed   HPV Vaccine  Aged Out     Advanced directives: Please bring a copy of your health care power of attorney and living will to the office at your convenience.  Conditions/risks identified: lose 10 lbs  Next appointment: Follow up in one year for your annual wellness visit    Preventive Care 65 Years and Older, Male Preventive care refers to lifestyle choices and visits with your health care provider that can promote health and wellness. What does preventive care include? A yearly physical exam. This is also called an annual well check. Dental exams once or twice a year. Routine eye exams. Ask your health care provider how often you should have your eyes checked. Personal lifestyle choices, including: Daily care of your teeth and gums. Regular physical activity. Eating a healthy diet. Avoiding tobacco and drug use. Limiting alcohol use. Practicing safe sex. Taking low-dose aspirin every day. Taking vitamin and mineral supplements as recommended by your health care provider. What happens during an annual well check? The services and screenings done by your health care provider during your annual well  check will depend on your age, overall health, lifestyle risk factors, and family history of disease. Counseling  Your health care provider may ask you questions about your: Alcohol use. Tobacco use. Drug use. Emotional well-being. Home and relationship well-being. Sexual activity. Eating habits. History of falls. Memory and ability to understand (cognition). Work and work Statistician. Reproductive health. Screening  You may have the following tests or measurements: Height, weight, and BMI. Blood pressure. Lipid and cholesterol levels. These may be checked every 5 years, or more frequently if you are over 36 years old. Skin check. Lung cancer screening. You may have this screening every year starting at age 75 if you have a 30-pack-year history of smoking and currently smoke or have quit within the  past 15 years. Fecal occult blood test (FOBT) of the stool. You may have this test every year starting at age 24. Flexible sigmoidoscopy or colonoscopy. You may have a sigmoidoscopy every 5 years or a colonoscopy every 10 years starting at age 56. Hepatitis C blood test. Hepatitis B blood test. Sexually transmitted disease (STD) testing. Diabetes screening. This is done by checking your blood sugar (glucose) after you have not eaten for a while (fasting). You may have this done every 1-3 years. Bone density scan. This is done to screen for osteoporosis. You may have this done starting at age 17. Mammogram. This may be done every 1-2 years. Talk to your health care provider about how often you should have regular mammograms. Talk with your health care provider about your test results, treatment options, and if necessary, the need for more tests. Vaccines  Your health care provider may recommend certain vaccines, such as: Influenza vaccine. This is recommended every year. Tetanus, diphtheria, and acellular pertussis (Tdap, Td) vaccine. You may need a Td booster every 10 years. Zoster vaccine. You may need this after age 39. Pneumococcal 13-valent conjugate (PCV13) vaccine. One dose is recommended after age 88. Pneumococcal polysaccharide (PPSV23) vaccine. One dose is recommended after age 38. Talk to your health care provider about which screenings and vaccines you need and how often you need them. This information is not intended to replace advice given to you by your health care provider. Make sure you discuss any questions you have with your health care provider. Document Released: 05/20/2015 Document Revised: 01/11/2016 Document Reviewed: 02/22/2015 Elsevier Interactive Patient Education  2017 [place] Prevention in the Home Falls can cause injuries. They can happen to people of all ages. There are many things you can do to make your home safe and to help prevent falls. What can  I do on the outside of my home? Regularly fix the edges of walkways and driveways and fix any cracks. Remove anything that might make you trip as you walk through a door, such as a raised step or threshold. Trim any bushes or trees on the path to your home. Use bright outdoor lighting. Clear any walking paths of anything that might make someone trip, such as rocks or tools. Regularly check to see if handrails are loose or broken. Make sure that both sides of any steps have handrails. Any raised decks and porches should have guardrails on the edges. Have any leaves, snow, or ice cleared regularly. Use sand or salt on walking paths during winter. Clean up any spills in your garage right away. This includes oil or grease spills. What can I do in the bathroom? Use night lights. Install grab bars by the toilet and in the tub  and shower. Do not use towel bars as grab bars. Use non-skid mats or decals in the tub or shower. If you need to sit down in the shower, use a plastic, non-slip stool. Keep the floor dry. Clean up any water that spills on the floor as soon as it happens. Remove soap buildup in the tub or shower regularly. Attach bath mats securely with double-sided non-slip rug tape. Do not have throw rugs and other things on the floor that can make you trip. What can I do in the bedroom? Use night lights. Make sure that you have a light by your bed that is easy to reach. Do not use any sheets or blankets that are too big for your bed. They should not hang down onto the floor. Have a firm chair that has side arms. You can use this for support while you get dressed. Do not have throw rugs and other things on the floor that can make you trip. What can I do in the kitchen? Clean up any spills right away. Avoid walking on wet floors. Keep items that you use a lot in easy-to-reach places. If you need to reach something above you, use a strong step stool that has a grab bar. Keep electrical  cords out of the way. Do not use floor polish or wax that makes floors slippery. If you must use wax, use non-skid floor wax. Do not have throw rugs and other things on the floor that can make you trip. What can I do with my stairs? Do not leave any items on the stairs. Make sure that there are handrails on both sides of the stairs and use them. Fix handrails that are broken or loose. Make sure that handrails are as long as the stairways. Check any carpeting to make sure that it is firmly attached to the stairs. Fix any carpet that is loose or worn. Avoid having throw rugs at the top or bottom of the stairs. If you do have throw rugs, attach them to the floor with carpet tape. Make sure that you have a light switch at the top of the stairs and the bottom of the stairs. If you do not have them, ask someone to add them for you. What else can I do to help prevent falls? Wear shoes that: Do not have high heels. Have rubber bottoms. Are comfortable and fit you well. Are closed at the toe. Do not wear sandals. If you use a stepladder: Make sure that it is fully opened. Do not climb a closed stepladder. Make sure that both sides of the stepladder are locked into place. Ask someone to hold it for you, if possible. Clearly mark and make sure that you can see: Any grab bars or handrails. First and last steps. Where the edge of each step is. Use tools that help you move around (mobility aids) if they are needed. These include: Canes. Walkers. Scooters. Crutches. Turn on the lights when you go into a dark area. Replace any light bulbs as soon as they burn out. Set up your furniture so you have a clear path. Avoid moving your furniture around. If any of your floors are uneven, fix them. If there are any pets around you, be aware of where they are. Review your medicines with your doctor. Some medicines can make you feel dizzy. This can increase your chance of falling. Ask your doctor what other  things that you can do to help prevent falls. This information is not  intended to replace advice given to you by your health care provider. Make sure you discuss any questions you have with your health care provider. Document Released: 02/17/2009 Document Revised: 09/29/2015 Document Reviewed: 05/28/2014 Elsevier Interactive Patient Education  2017 [employer].

## 2022-04-05 NOTE — Progress Notes (Signed)
I connected with  Joshua Hahn on 04/05/22 by a audio enabled telemedicine application and verified that I am speaking with the correct person using two identifiers.  Patient Location: Home  Provider Location: Office/Clinic  I discussed the limitations of evaluation and management by telemedicine. The patient expressed understanding and agreed to proceed.   Subjective:   Joshua Hahn is a 76 y.o. male who presents for Medicare Annual/Subsequent preventive examination.  Review of Systems     Cardiac Risk Factors include: advanced age (>76mn, >>58women);hypertension;dyslipidemia;male gender;obesity (BMI >30kg/m2)     Objective:    Today's Vitals   04/05/22 1056  Weight: 210 lb (95.3 kg)   Body mass index is 30.13 kg/m.     04/05/2022   11:01 AM 08/14/2021    8:15 PM 03/23/2021   11:13 AM 09/29/2020    6:12 PM 05/12/2020    8:55 PM 03/21/2020   12:23 PM 02/10/2019   10:25 AM  Advanced Directives  Does Patient Have a Medical Advance Directive? Yes Yes Yes Yes Yes Yes Yes  Type of AParamedicof [place] will Living will Healthcare Power of Attorney Living will;Healthcare Power of [place] will Living will;Healthcare Power of Attorney  Does patient want to make changes to medical advance directive?  No - Patient declined   No - Patient declined  No - Patient declined  Copy of [place] Chart? No - copy requested  No - copy requested  No - copy requested  No - copy requested  Would patient like information on creating a medical advance directive?  No - Patient declined    No - Patient declined     Current Medications (verified) Outpatient Encounter Medications as of 04/05/2022  Medication Sig   amLODipine (NORVASC) 5 MG tablet TAKE 1 TABLET (5 MG TOTAL) BY MOUTH DAILY.   Ascorbic Acid (VITAMIN C PO) Take by mouth.   aspirin 81 MG tablet Take 81  mg by mouth daily.   cholecalciferol (VITAMIN D) 1000 UNITS tablet Take 1,000 Units by mouth daily.   clopidogrel (PLAVIX) 75 MG tablet TAKE 1 TABLET BY MOUTH EVERY DAY   Coenzyme Q10 (CO Q 10) 100 MG CAPS Take 300 mg by mouth every evening.   dorzolamide (TRUSOPT) 2 % ophthalmic solution SMARTSIG:In Eye(s)   ezetimibe (ZETIA) 10 MG tablet Take 1 tablet (10 mg total) by mouth daily.   isosorbide mononitrate (IMDUR) 60 MG 24 hr tablet TAKE 1 TABLET (60 MG TOTAL) BY MOUTH DAILY. NEED OV FOR FUTURE REFILL   metoprolol tartrate (LOPRESSOR) 25 MG tablet TAKE 1 TABLET BY MOUTH EVERY MORNING AND ONE HALF TABLETS EVERY EVENING.NEED APPT WITH FOR REFILLS   nitroGLYCERIN (NITROSTAT) 0.4 MG SL tablet Place 1 tablet (0.4 mg total) under the tongue every 5 (five) minutes as needed for chest pain.   omega-3 acid ethyl esters (LOVAZA) 1 g capsule TAKE 1 CAPSULE BY MOUTH 2 TIMES DAILY.   ranolazine (RANEXA) 500 MG 12 hr tablet TAKE 1 TABLET BY MOUTH TWICE A DAY   rosuvastatin (CRESTOR) 40 MG tablet TAKE 1 TABLET BY MOUTH EVERY DAY   valsartan (DIOVAN) 80 MG tablet TAKE 1 TABLET BY MOUTH EVERY DAY   vitamin B-12 (CYANOCOBALAMIN) 100 MCG tablet Take 100 mcg by mouth daily.    No facility-administered encounter medications on file as of 04/05/2022.    Allergies (verified) Patient has no known allergies.   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 levels LDL IIIa+b% and HDL2b%   Hypertension    Myocardial infarct [facility])    Myocardial infarction ([place]) 1994   infer[facility] wall [facility]   Prostate cancer [facility])    Sleep apnea    uses CPAP PRN   Sleep apnea,  obstructive    using C-PAP   Past Surgical History:  Procedure Laterality Date   ABDOMINAL SURGERY     CARDIAC CATHETERIZATION  06/23/2008   occlusion sequential OM1 andOM2 graft as well vein graft to RCA.LAD and diagonal system remain intact. RCA  collateralized   CORONARY ANGIOPLASTY WITH STENT PLACEMENT     CORONARY ARTERY BYPASS GRAFT  1994   EXPLORATORY LAPAROTOMY     ideopathic retroperitoneal fibrosis   LEFT HEART CATHETERIZATION WITH CORONARY/GRAFT ANGIOGRAM N/A 02/04/2013   Procedure: LEFT HEART CATHETERIZATION WITH Beatrix Fetters;  Surgeon: [name], MD;  Location: [facility] CATH LAB;  Service: Cardiovascular;  Laterality: N/A;   LEFT PAROTID GLAND SURGRY  19/1478   Dr. Erik Obey   PERCUTANEOUS CORONARY STENT INTERVENTION (PCI-S)  06/25/2008   rotational atherectomy w/diffuse disease native circ. w/drug eluting stents , PTCA on one vessel; grat occlusion to RCA w/native RCA proxim. occlusion. Circ.supplied collaaterals distal RCA  following intervention   PERCUTANEOUS CORONARY STENT INTERVENTION (PCI-S) N/A 02/05/2013   Procedure: PERCUTANEOUS CORONARY STENT INTERVENTION (PCI-S);  Surgeon: [name], MD;  Location: [facility] CATH LAB;  Service: Cardiovascular;  Laterality: N/A;   PROSTATECTOMY  06/2009   robotic-asssisted laparoscopic radical by Dr Rosana Hoes   Family History  Problem Relation Age of Onset   Pancreatic cancer Father    Heart disease Father    Hypertension Father    Stroke Mother    Hypertension Mother    Coronary artery disease Brother 70       2 bros with early CAD   Hypertension Brother    Coronary artery disease Brother    Hypertension Brother    Coronary artery disease Brother    Hypertension Brother    Heart attack Maternal Grandfather    Hypertension Paternal Grandfather    Lupus Daughter    Hypertension Son    Hypertension Sister    Hypertension Sister    Throat cancer Brother    Colon cancer Neg Hx    Esophageal cancer Neg Hx    Stomach  cancer Neg Hx    Rectal cancer Neg Hx    Social History   Socioeconomic History   Marital status: Married    Spouse name: Not on file   Number of children: 2   Years of education: Not on file   Highest education level: Not on file  Occupational History   Occupation: Theme park manager -retired     Comment: [place]   Tobacco Use   Smoking status: Former    Types: Cigarettes    Quit date: 05/07/1973    Years since quitting: 48.9    Passive exposure: Never   Smokeless tobacco: Never  Vaping Use   Vaping Use: Never used  Substance and Sexual Activity   Alcohol use: No  Drug use: No   Sexual activity: Not Currently  Other Topics Concern   Not on file  Social History Narrative   ** Merged History Encounter **       Married 48 years in 2016. 2 children. 5 grand kids.   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   Hobbies: speaking events, traveling   Social Determinants of Health   Financial Resource Strain: Low Risk  (04/05/2022)   Overall Financial Resource Strain (CARDIA)    Difficulty of Paying Living Expenses: Not hard at all  Food Insecurity: No Food Insecurity (04/05/2022)   Hunger Vital Sign    Worried About Running Out of Food in the Last Year: Never true    Ran Out of Food in the Last Year: Never true  Transportation Needs: No Transportation Needs (04/05/2022)   PRAPARE - Hydrologist (Medical): No    Lack of Transportation (Non-Medical): No  Physical Activity: Sufficiently Active (04/05/2022)   Exercise Vital Sign    Days of Exercise per Week: 2 days    Minutes of Exercise per Session: 120 min  Stress: No Stress Concern Present (04/05/2022)   [place]    Feeling of Stress : Not at all  Social Connections: [place] (04/05/2022)   Social Connection and Isolation Panel [NHANES]    Frequency of Communication with Friends and Family: More  than three times a week    Frequency of Social Gatherings with Friends and Family: More than three times a week    Attends Religious Services: More than 4 times per year    Active Member of [employer] or Organizations: Yes    Attends Archivist Meetings: 1 to 4 times per year    Marital Status: Married    Tobacco Counseling Counseling given: Not Answered   Clinical Intake:  Pre-visit preparation completed: Yes  Pain : No/denies pain     BMI - recorded: 30.13 Nutritional Status: BMI > 30  Obese Nutritional Risks: None Diabetes: No  How often do you need to have someone help you when you read instructions, pamphlets, or other written materials from your doctor or pharmacy?: 1 - Never  Diabetic?no  Interpreter Needed?: No  Information entered by :: Charlott Rakes, LPN   Activities of Daily Living    04/05/2022   11:02 AM  In your present state of health, do you have any difficulty performing the following activities:  Hearing? 0  Vision? 0  Difficulty concentrating or making decisions? 0  Walking or climbing stairs? 0  Dressing or bathing? 0  Doing errands, shopping? 0  Preparing Food and eating ? N  Using the Toilet? N  In the past six months, have you accidently leaked urine? N  Do you have problems with loss of bowel control? N  Managing your Medications? N  Managing your Finances? N  Housekeeping or managing your Housekeeping? N    Patient Care Team: Marin Olp, MD as PCP - General (Family Medicine) [name], MD as PCP - Cardiology (Cardiology) Marin Olp, MD (Family Medicine) Inda Coke, [place] as Physician Assistant (Physician Assistant) Demarco, [place], [place] as Consulting Physician (Optometry) Edythe Clarity, [facility] as Pharmacist (Pharmacist)  Indicate any recent Medical Services you may have received from other than [facility] providers in the past year (date may be approximate).     Assessment:   This is a routine wellness  examination for [place].  Hearing/Vision screen Hearing Screening - Comments:: Pt denies any hearing issues  Vision Screening - Comments:: Pt follows up with Dr Herbert Deaner for annual eye exams   Dietary issues and exercise activities discussed: Current Exercise Habits: Home exercise routine, Type of exercise: walking;Other - see comments, Time (Minutes): > 60, Frequency (Times/Week): 2, Weekly Exercise (Minutes/Week): 0   Goals Addressed             This Visit's Progress    Patient Stated       Lose 10 lbs        Depression Screen    04/05/2022   10:59 AM 03/23/2021   11:12 AM 06/14/2020   11:12 AM 02/10/2019   10:25 AM 01/01/2018    9:24 AM 01/01/2018    8:43 AM 07/10/2017   10:37 AM  PHQ 2/9 Scores  PHQ - 2 Score 0 0 0 0 0 0 0  PHQ- 9 Score     0      Fall Risk    04/05/2022   11:02 AM 03/23/2021   11:14 AM 06/14/2020   11:12 AM 02/10/2019   10:25 AM 01/01/2018    9:24 AM  Fall Risk   Falls in the past year? 0 0 1 0 No  Number falls in past yr: 0 0 0    Injury with Fall? 0 0 1 0   Risk for fall due to : Impaired vision Impaired vision     Follow up Falls prevention discussed Falls prevention discussed  Education provided;Falls prevention discussed;Falls evaluation completed     FALL RISK PREVENTION PERTAINING TO THE HOME:  Any stairs in or around the home? Yes  If so, are there any without handrails? No  Home free of loose throw rugs in walkways, pet beds, electrical cords, etc? Yes  Adequate lighting in your home to reduce risk of falls? Yes   ASSISTIVE DEVICES UTILIZED TO PREVENT FALLS:  Life alert? No  Use of a cane, walker or w/c? No  Grab bars in the bathroom? No  Shower chair or bench in shower? No  Elevated toilet seat or a handicapped toilet? No   TIMED UP AND GO:  Was the test performed? No .   Cognitive Function:        04/05/2022   11:03 AM 03/23/2021   11:16 AM 02/10/2019   10:26 AM 01/01/2018    8:49 AM  6CIT Screen  What Year? 0 points 0  points 0 points 0 points  What month? 0 points 0 points 0 points 0 points  What time? 0 points 0 points 0 points 0 points  Count back from 20 0 points 0 points 0 points 0 points  Months in reverse 0 points 0 points 0 points 0 points  Repeat phrase 0 points 0 points 0 points   Total Score 0 points 0 points 0 points     Immunizations Immunization History  Administered Date(s) Administered   Influenza-Unspecified 08/06/2017, 02/20/2021   PFIZER Comirnaty(Gray Top)Covid-19 Tri-Sucrose Vaccine 02/24/2020, 11/30/2020   PFIZER(Purple Top)SARS-COV-2 Vaccination 02/24/2020   Pfizer Covid-19 Vaccine Bivalent Booster 97yr & up 05/11/2021   Pneumococcal Conjugate-13 09/28/2014   Pneumococcal Polysaccharide-23 07/10/2017   Td 05/08/1999, 10/08/2014   Zoster Recombinat (Shingrix) 02/10/2019, 10/09/2019    TDAP status: Up to date  Flu Vaccine status: Due, Education has been provided regarding the importance of this vaccine. Advised may receive this vaccine at local pharmacy or Health Dept. Aware to provide a copy of the vaccination record  if obtained from local pharmacy or Health Dept. Verbalized acceptance and understanding.  Pneumococcal vaccine status: Up to date  Covid-19 vaccine status: Completed vaccines  Qualifies for Shingles Vaccine? Yes   Zostavax completed Yes   Shingrix Completed?: Yes  Screening Tests Health Maintenance  Topic Date Due   COLONOSCOPY (Pts 45-61yr Insurance coverage will need to be confirmed)  12/03/2019   INFLUENZA VACCINE  12/05/2021   COVID-19 Vaccine (5 - 2023-24 season) 01/05/2022   Medicare Annual Wellness (AWV)  04/06/2023   DTaP/Tdap/Td (3 - Tdap) 10/07/2024   Pneumonia Vaccine 77 Years old  Completed   Hepatitis C Screening  Completed   Zoster Vaccines- Shingrix  Completed   HPV VACCINES  Aged Out    Health Maintenance  Health Maintenance Due  Topic Date Due   COLONOSCOPY (Pts 45-411yrInsurance coverage will need to be confirmed)   12/03/2019   INFLUENZA VACCINE  12/05/2021   COVID-19 Vaccine (5 - 2023-24 season) 01/05/2022    Colorectal cancer screening: Referral to GI placed 04/05/22. Pt aware the office will call re: appt.   Additional Screening:  Hepatitis C Screening:  Completed 01/09/18  Vision Screening: Recommended annual ophthalmology exams for early detection of glaucoma and other disorders of the eye. Is the patient up to date with their annual eye exam?  Yes  Who is the provider or what is the name of the office in which the patient attends annual eye exams? Dr HeHerbert DeanerIf pt is not established with a provider, would they like to be referred to a provider to establish care? No .   Dental Screening: Recommended annual dental exams for proper oral hygiene  Community Resource Referral / Chronic Care Management: CRR required this visit?  No   CCM required this visit?  No      Plan:     I have personally reviewed and noted the following in the patient's chart:   Medical and social history Use of alcohol, tobacco or illicit drugs  Current medications and supplements including opioid prescriptions. Patient is not currently taking opioid prescriptions. Functional ability and status Nutritional status Physical activity Advanced directives List of other physicians Hospitalizations, surgeries, and ER visits in previous 12 months Vitals Screenings to include cognitive, depression, and falls Referrals and appointments  In addition, I have reviewed and discussed with patient certain preventive protocols, quality metrics, and best practice recommendations. A written personalized care plan for preventive services as well as general preventive health recommendations were provided to patient.     TiWillette BraceLPN   [id number] Nurse Notes: none

## 2022-04-06 NOTE — Progress Notes (Unsigned)
Chronic Care Management Pharmacy Note  04/11/2022 Name:  Joshua Hahn MRN:  [id number] DOB:  Oct 08, 1945  Summary: PharmD FU visit.  Patient doing well overall does have some expensive medications, checked Goodrx and he is getting the best deal he can.  LDL well controlled and is adherent with meds - no longer taking Lovaza.  Recommendations/Changes made from today's visit: Due for updated lipid panel  Plan: FU 6 months   Subjective: Joshua Hahn is an 76 y.o. year old male who is a primary patient of Hunter, Brayton Mars, MD.  The CCM team was consulted for assistance with disease management and care coordination needs.    Engaged with patient by telephone for initial visit in response to provider referral for pharmacy case management and/or care coordination services.   Consent to Services:  The patient was given the following information about Chronic Care Management services today, agreed to services, and gave verbal consent: 1. CCM service includes personalized support from designated clinical staff supervised by the primary care provider, including individualized plan of care and coordination with other care providers 2. 24/7 contact phone numbers for assistance for urgent and routine care needs. 3. Service will only be billed when office clinical staff spend 20 minutes or more in a month to coordinate care. 4. Only one practitioner may furnish and bill the service in a calendar month. 5.The patient may stop CCM services at any time (effective at the end of the month) by phone call to the office staff. 6. The patient will be responsible for cost sharing (co-pay) of up to 20% of the service fee (after annual deductible is met). Patient agreed to services and consent obtained.  Patient Care Team: Joshua Olp, MD as PCP - General (Family Medicine) Joshua Sine, MD as PCP - Cardiology (Cardiology) Joshua Olp, MD (Family Medicine) Joshua Hahn, [place] as Physician  Assistant (Physician Assistant) Joshua Hahn, [place] as Consulting Physician (Optometry) Joshua Hahn, [facility] as Pharmacist (Pharmacist)  Recent office visits:  None   Recent consult visits:  11/28/2020 OV (cardiology) Joshua Sine, MD; With his blood pressure elevation today, I am starting amlodipine 5 mg.  Tomorrow he will come back to the office for fasting laboratory with a comprehensive metabolic panel, CBC, TSH and lipid studies.  If renal function has further deteriorated we may need to discontinue valsartan.    08/08/2020 OV (cardiology) Joshua Sine, MD; no medication changes indicated.   Hospital visits:  09/29/2020 ED Visit at [facility] Positive COVID No medication changes indicated.   Objective:  Lab Results  Component Value Date   CREATININE 1.8 (A) 02/06/2022   BUN 19 02/06/2022   GFR 52.10 (L) 01/09/2018   GFRNONAA 35 (L) 09/29/2020   GFRAA 39 (L) 04/28/2020   NA 137 02/06/2022   K 4.6 02/06/2022   CALCIUM 10.2 02/06/2022   CO2 27 (A) 02/06/2022   GLUCOSE 110 (H) 11/29/2020    Lab Results  Component Value Date/Time   HGBA1C 7.0 (H) 04/28/2020 09:46 AM   HGBA1C (H) 10/23/2009 07:21 PM    6.4 (NOTE)  According to the ADA Clinical Practice Recommendations for 2011, when HbA1c is used as a screening test:   >=6.5%   Diagnostic of Diabetes Mellitus           (if abnormal result  is confirmed)  5.7-6.4%   Increased risk of developing Diabetes Mellitus  References:Diagnosis and Classification of Diabetes Mellitus,Diabetes GEXB,2841,32(GMWNU 1):S62-S69 and Standards of Medical Care in         Diabetes - 2011,Diabetes UVOZ,3664,40  (Suppl 1):S11-S61.   GFR 52.10 (L) 01/09/2018 12:29 PM   GFR 54.12 (L) 02/27/2012 05:18 PM    Last diabetic Eye exam: No results found for: "HMDIABEYEEXA"  Last diabetic Foot exam: No results found for: "HMDIABFOOTEX"   Lab  Results  Component Value Date   CHOL 113 11/29/2020   HDL 39 (L) 11/29/2020   LDLCALC 54 11/29/2020   LDLDIRECT 66.0 01/09/2018   TRIG 107 11/29/2020   CHOLHDL 2.9 11/29/2020       Latest Ref Rng & Units 02/06/2022   12:00 AM 11/29/2020   10:06 AM 04/28/2020    9:46 AM  Hepatic Function  Total Protein 6.0 - 8.5 g/dL  6.7  6.7   Albumin 3.5 - 5.0 4.2  4.0  4.4   AST 0 - 40 IU/L  36  29   ALT 0 - 44 IU/L  23  22   Alk Phosphatase 44 - 121 IU/L  74  84   Total Bilirubin 0.0 - 1.2 mg/dL  0.6  0.7     Lab Results  Component Value Date/Time   TSH 2.280 11/29/2020 10:06 AM   TSH 2.030 04/28/2020 09:46 AM       Latest Ref Rng & Units 02/06/2022   12:00 AM 11/29/2020   10:06 AM 09/29/2020    6:48 PM  CBC  WBC 3.4 - 10.8 x10E3/uL  6.1  7.2   Hemoglobin 13.5 - 17.5 14.2  14.3  14.0   Hematocrit 37.5 - 51.0 %  43.5  43.1   Platelets 150 - 450 x10E3/uL  179  169     Lab Results  Component Value Date/Time   VD25OH 41.84 01/09/2018 12:29 PM   VD25OH 40 09/30/2014 12:00 AM    Clinical ASCVD: Yes  The ASCVD Risk score (Arnett DK, et al., 2019) failed to calculate for the following reasons:   The patient has a prior MI or stroke diagnosis       04/05/2022   10:59 AM 03/23/2021   11:12 AM 06/14/2020   11:12 AM  Depression screen PHQ 2/9  Decreased Interest 0 0 0  Down, Depressed, Hopeless 0 0 0  PHQ - 2 Score 0 0 0    Social History   Tobacco Use  Smoking Status Former   Types: Cigarettes   Quit date: 05/07/1973   Years since quitting: 48.9   Passive exposure: Never  Smokeless Tobacco Never   BP Readings from Last 3 Encounters:  08/14/21 (!) 154/71  07/25/21 126/70  07/03/21 (!) 161/69   Pulse Readings from Last 3 Encounters:  08/14/21 (!) 58  07/25/21 73  07/03/21 64   Wt Readings from Last 3 Encounters:  04/05/22 210 lb (95.3 kg)  08/14/21 210 lb (95.3 kg)  07/25/21 223 lb (101.2 kg)   BMI Readings from Last 3 Encounters:  04/05/22 30.13 kg/m  08/14/21  30.13 kg/m  07/25/21 32.00 kg/m    Assessment/Interventions: Review of patient past medical history, allergies, medications, health status, including review of consultants reports, laboratory  and other test data, was performed as part of comprehensive evaluation and provision of chronic care management services.   SDOH:  (Social Determinants of Health) assessments and interventions performed: Yes SDOH Interventions    Flowsheet Row Clinical Support from 04/05/2022 in [place] from 01/01/2018 in [place] Interventions Intervention Not Indicated --  Housing Interventions Intervention Not Indicated --  Transportation Interventions Intervention Not Indicated --  Depression Interventions/Treatment  -- Patient refuses Treatment  Financial Strain Interventions Intervention Not Indicated --  Physical Activity Interventions Intervention Not Indicated --  Stress Interventions Intervention Not Indicated --  Social Connections Interventions Intervention Not Indicated --      Financial Resource Strain: Low Risk  (04/05/2022)   Overall Financial Resource Strain (CARDIA)    Difficulty of Paying Living Expenses: Not hard at all    [place]: No Food Insecurity (04/05/2022)  Housing: Low Risk  (04/05/2022)  Transportation Needs: No Transportation Needs (04/05/2022)  Depression (PHQ2-9): Low Risk  (04/05/2022)  Financial Resource Strain: Low Risk  (04/05/2022)  Physical Activity: Sufficiently Active (04/05/2022)  Social Connections: Socially Integrated (04/05/2022)  Stress: No Stress Concern Present (04/05/2022)  Tobacco Use: Medium Risk (04/05/2022)    [place]  No Known Allergies  Medications Reviewed Today     Reviewed by Joshua Hahn, [facility] (Pharmacist) on 04/11/22 at 1134  Med List Status: <None>   Medication Order Taking? Sig Documenting  Provider Last Dose Status Informant  amLODipine (NORVASC) 5 MG tablet [id number] Yes TAKE 1 TABLET (5 MG TOTAL) BY MOUTH DAILY. Joshua Sine, MD Taking Active   Ascorbic Acid (VITAMIN C PO) [id number] Yes Take by mouth. [provider] Taking Active   aspirin 81 MG tablet [id number] Yes Take 81 mg by mouth daily. [provider] Taking Active Self  cholecalciferol (VITAMIN D) 1000 UNITS tablet [id number] Yes Take 1,000 Units by mouth daily. [provider] Taking Active Self  clopidogrel (PLAVIX) 75 MG tablet [id number] Yes TAKE 1 TABLET BY MOUTH EVERY DAY Joshua Sine, MD Taking Active   Coenzyme Q10 (CO Q 10) 100 MG CAPS [id number] Yes Take 300 mg by mouth every evening. [provider] Taking Active Self  dorzolamide (TRUSOPT) 2 % ophthalmic solution [id number] Yes SMARTSIG:In Eye(s) [provider] Taking Active   ezetimibe (ZETIA) 10 MG tablet [id number] Yes Take 1 tablet (10 mg total) by mouth daily. Joshua Sine, MD Taking Active   isosorbide mononitrate (IMDUR) 60 MG 24 hr tablet [id number] Yes TAKE 1 TABLET (60 MG TOTAL) BY MOUTH DAILY. NEED OV FOR FUTURE REFILL Joshua Sine, MD Taking Active   metoprolol tartrate (LOPRESSOR) 25 MG tablet [id number] Yes TAKE 1 TABLET BY MOUTH EVERY MORNING AND ONE HALF TABLETS EVERY EVENING.NEED APPT WITH FOR REFILLS Joshua Sine, MD Taking Active   nitroGLYCERIN (NITROSTAT) 0.4 MG SL tablet [id number] Yes Place 1 tablet (0.4 mg total) under the tongue every 5 (five) minutes as needed for chest pain. Joshua Sine, MD Taking Active   omega-3 acid ethyl esters (LOVAZA) 1 g capsule [id number] Yes TAKE 1 CAPSULE BY MOUTH 2 TIMES DAILY. Joshua Sine, MD Taking Active   ranolazine (RANEXA) 500 MG 12 hr tablet [id number] Yes TAKE 1 TABLET BY MOUTH TWICE A DAY Joshua Sine, MD Taking Active   rosuvastatin (CRESTOR) 40 MG tablet [id number] Yes TAKE 1 TABLET BY MOUTH EVERY DAY Claiborne Billings,  Joyice Faster, MD Taking Active    valsartan (DIOVAN) 80 MG tablet [id number] Yes TAKE 1 TABLET BY MOUTH EVERY DAY Joshua Sine, MD Taking Active   vitamin B-12 (CYANOCOBALAMIN) 100 MCG tablet [id number] Yes Take 100 mcg by mouth daily.  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   Chronic renal insufficiency, stage III (moderate) ([place]) 02/05/2013   CAD - PCI 1980s, CABG X 4 1994, DES CFX 08/2008, stent 2014 nstemi 01/11/2013   Old MI (myocardial infarction) 01/11/2013   OSA on CPAP 01/11/2013   ADENOCARCINOMA, PROSTATE, GLEASON GRADE 7 04/05/2009   Hyperlipidemia 12/18/2006   Essential hypertension 12/18/2006    Immunization History  Administered Date(s) Administered   Influenza-Unspecified 08/06/2017, 02/20/2021   PFIZER Comirnaty(Gray Top)Covid-19 Tri-Sucrose Vaccine 02/24/2020, 11/30/2020   PFIZER(Purple Top)SARS-COV-2 Vaccination 02/24/2020   Pfizer Covid-19 Vaccine Bivalent Booster 30yr & up 05/11/2021   Pneumococcal Conjugate-13 09/28/2014   Pneumococcal Polysaccharide-23 07/10/2017   Td 05/08/1999, 10/08/2014   Zoster Recombinat (Shingrix) 02/10/2019, 10/09/2019    Conditions to be addressed/monitored:  HTN, CAD, Hx of MI, HLD  Care Plan : General Pharmacy (Adult)  Updates made by DEdythe Hahn RPH since 04/11/2022 12:00 AM     Problem: HTN, CAD, Hx of MI, HLD   Priority: High  Onset Date: 02/06/2021     Long-Range Goal: Patient-Specific Goal   Start Date: 02/06/2021  Expected End Date: 08/07/2021  Recent Progress: On track  Priority: High  Note:   Current Barriers:  High copay with Lovaza  Pharmacist Clinical Goal(s):  Patient will maintain control of BP and lipids as evidenced by labs  through collaboration with PharmD and provider.   Interventions: 1:1 collaboration with HMarin Olp MD regarding  development and update of comprehensive plan of care as evidenced by provider attestation and co-signature Inter-disciplinary care team collaboration (see longitudinal plan of care) Comprehensive medication review performed; medication list updated in electronic medical record  Hypertension (BP goal <130/80) -Controlled -Current treatment: Amlodipine 551mdaily Valsartan 8019maily Metoprolol tartrate 19m54mD Imdur 60mg36mr daily -Medications previously tried: none noted  -Current home readings: 116-120s/60-70s -Current dietary habits: vegetarian gets protein from tofu -Current exercise habits: has personal trianer, does cardio and strength training with his wife, also [facility] hypotensive/hypertensive symptoms -Educated on BP goals and benefits of medications for prevention of heart attack, stroke and kidney damage; Daily salt intake goal < 2300 mg; Exercise goal of 150 minutes per week; Symptoms of hypotension and importance of maintaining adequate hydration; -Counseled to monitor BP at home weekly, document, and provide log at future appointments -Recommended to continue current medication  Hyperlipidemia: (LDL goal < 70) -Controlled, most recent LDL was 54 -Current treatment: Rosuvastatin 40mg 65my Appropriate, Effective, Safe, Accessible Zetia 10mg d48m Appropriate, Effective, Safe, Accessible -Medications previously tried: none noted  -Current dietary patterns: vegetaria -Current exercise habits: see above -Educated on Cholesterol goals;  Benefits of statin for ASCVD risk reduction; Importance of limiting foods high in cholesterol; -Recommended to continue current medication -He is no longer taking Lovaza so this eliminates one cost burden for him. -LDL controlled but he is due for another lipid panel, has not had one in < 1 year. -Stressed adherence, importance of statins. No changes needed -  continue routine screenings.  Hx of MI, CAD  (Goal: Reduce CV risk) 04/09/22 -Controlled -Current treatment  Clopidogrel 31m daily Appropriate, Effective, Safe, Accessible ASA 842mAppropriate, Effective, Safe, Accessible -Medications previously tried: none noted  -Patient with bypass in 1992, and NSTEMI with stent placement in 2014 -Denies any abnormal bleeding or bruising -Recommended to continue current medication -Tolerating all meds, no changes needed at this time. LDL is at goal.  Continues to take statin.  Patient Goals/Self-Care Activities Patient will:  - take medications as prescribed check blood pressure weekly, document, and provide at future appointments target a minimum of 150 minutes of moderate intensity exercise weekly  Follow Up Plan: The care management team will reach out to the patient again over the next 365 days.             Medication Assistance: None required.  Patient affirms current coverage meets needs.  Compliance/Adherence/Medication fill history: Care Gaps: N/A  Star-Rating Drugs: Rosuvastatin 4026m9/15/23 90ds Valsartan 11m105mily 03/02/22 90ds  Patient's preferred pharmacy is:  [id number] [place] [place]: [phone]/pharmacy #[id number] [address] [address]: [phone] [place] [address]: [phone] #[id number] [address] [place] [id number] [phone] pill box? No - has own organization method, takes them on trips Pt endorses 100% compliance  We discussed: Benefits of medication synchronization,  packaging and delivery as well as enhanced pharmacist oversight with [facility]. Patient decided to: Continue current medication management strategy - patient considering [facility] Patient Decision:  Patient agrees to Care Plan and Follow-up.  Plan: The care management team will reach out to the patient again over the next 180 days.  ChristiBeverly MilchD Clinical Pharmacist [phone]

## 2022-04-09 ENCOUNTER — Ambulatory Visit: Payer: Medicare Other | Admitting: Pharmacist

## 2022-04-09 DIAGNOSIS — E785 Hyperlipidemia, unspecified: Secondary | ICD-10-CM

## 2022-04-09 DIAGNOSIS — I25118 Atherosclerotic heart disease of native coronary artery with other forms of angina pectoris: Secondary | ICD-10-CM

## 2022-04-11 ENCOUNTER — Encounter: Payer: Self-pay | Admitting: Cardiovascular Disease

## 2022-04-11 ENCOUNTER — Encounter: Payer: Self-pay | Admitting: Family Medicine

## 2022-04-11 NOTE — Patient Instructions (Addendum)
Visit Information   Goals Addressed             This Visit's Progress    Track and Manage My Blood Pressure-Hypertension   On track    Timeframe:  Long-Range Goal Priority:  High Start Date: 02/06/21                            Expected End Date:   08/07/20                    Follow Up Date 05/09/20    - check blood pressure weekly - choose a place to take my blood pressure (home, clinic or office, retail store) - write blood pressure results in a log or diary    Why is this important?   You won't feel high blood pressure, but it can still hurt your blood vessels.  High blood pressure can cause heart or kidney problems. It can also cause a stroke.  Making lifestyle changes like losing a little weight or eating less salt will help.  Checking your blood pressure at home and at different times of the day can help to control blood pressure.  If the doctor prescribes medicine remember to take it the way the doctor ordered.  Call the office if you cannot afford the medicine or if there are questions about it.     Notes:        Patient Care Plan: General Pharmacy (Adult)     Problem Identified: HTN, CAD, Hx of MI, HLD   Priority: High  Onset Date: 02/06/2021     Long-Range Goal: Patient-Specific Goal   Start Date: 02/06/2021  Expected End Date: 08/07/2021  Recent Progress: On track  Priority: High  Note:   Current Barriers:  High copay with Lovaza  Pharmacist Clinical Goal(s):  Patient will maintain control of BP and lipids as evidenced by labs  through collaboration with PharmD and provider.   Interventions: 1:1 collaboration with Marin Olp, MD regarding development and update of comprehensive plan of care as evidenced by provider attestation and co-signature Inter-disciplinary care team collaboration (see longitudinal plan of care) Comprehensive medication review performed; medication list updated in electronic medical record  Hypertension (BP goal  <130/80) -Controlled -Current treatment: Amlodipine '5mg'$  daily Valsartan '80mg'$  daily Metoprolol tartrate '25mg'$  BID Imdur '60mg'$  24hr daily -Medications previously tried: none noted  -Current home readings: 116-120s/60-70s -Current dietary habits: vegetarian gets protein from tofu -Current exercise habits: has personal trianer, does cardio and strength training with his wife, also plans to start swinning at [employer] -Denies hypotensive/hypertensive symptoms -Educated on BP goals and benefits of medications for prevention of heart attack, stroke and kidney damage; Daily salt intake goal < 2300 mg; Exercise goal of 150 minutes per week; Symptoms of hypotension and importance of maintaining adequate hydration; -Counseled to monitor BP at home weekly, document, and provide log at future appointments -Recommended to continue current medication  Hyperlipidemia: (LDL goal < 70) -Controlled, most recent LDL was 54 -Current treatment: Rosuvastatin '40mg'$  daily Appropriate, Effective, Safe, Accessible Zetia '10mg'$  daily Appropriate, Effective, Safe, Accessible -Medications previously tried: none noted  -Current dietary patterns: vegetaria -Current exercise habits: see above -Educated on Cholesterol goals;  Benefits of statin for ASCVD risk reduction; Importance of limiting foods high in cholesterol; -Recommended to continue current medication -He is no longer taking Lovaza so this eliminates one cost burden for him. -LDL controlled but he is due for another  lipid panel, has not had one in < 1 year. -Stressed adherence, importance of statins. No changes needed - continue routine screenings.  Hx of MI, CAD (Goal: Reduce CV risk) 04/09/22 -Controlled -Current treatment  Clopidogrel '75mg'$  daily Appropriate, Effective, Safe, Accessible ASA '81mg'$  Appropriate, Effective, Safe, Accessible -Medications previously tried: none noted  -Patient with bypass in 1992, and NSTEMI with stent placement in 2014 -Denies  any abnormal bleeding or bruising -Recommended to continue current medication -Tolerating all meds, no changes needed at this time. LDL is at goal.  Continues to take statin.  Patient Goals/Self-Care Activities Patient will:  - take medications as prescribed check blood pressure weekly, document, and provide at future appointments target a minimum of 150 minutes of moderate intensity exercise weekly  Follow Up Plan: The care management team will reach out to the patient again over the next 365 days.            The patient verbalized understanding of instructions, educational materials, and care plan provided today and DECLINED offer to receive copy of patient instructions, educational materials, and care plan.  Telephone follow up appointment with pharmacy team member scheduled for: 56 months  Cari Vandeberg L Erinn Huskins, [place], PharmD Clinical Pharmacist  [facility] [phone]

## 2022-04-13 ENCOUNTER — Other Ambulatory Visit: Payer: Self-pay | Admitting: Cardiovascular Disease

## 2022-04-19 ENCOUNTER — Encounter: Payer: Self-pay | Admitting: *Deleted

## 2022-05-14 ENCOUNTER — Encounter: Payer: Self-pay | Admitting: Cardiovascular Disease

## 2022-05-14 ENCOUNTER — Ambulatory Visit: Payer: Medicare Other | Admitting: Nurse Practitioner

## 2022-05-14 NOTE — Progress Notes (Deleted)
     05/14/2022 MORTY ORTWEIN [id number] [birth year]   Chief Complaint:  History of Present Illness: Shjon Lizarraga is a 77 year old male with a past medical history of hypertension, hyperlipidemia, coronary artery disease s/p PTCA RCA 1988, s/p inferior MI 1994 s/p  5 vessel CABG, s/p DES 2010, s/p PTCA in stent stenosis to the cx and PTCA/DES of the ostium in the vein graft supplying the LAD 2014, sleep apnea, prostate cancer 2010, renal insufficiency and tubular adenomatous colon polyps. He presents to our office today as referred by Dr. Yong Channel to schedule a colonoscopy.  He denies having any abdominal pain, change in his bowel pattern or rectal bleeding.  His most recent colonoscopy by Dr. Ardis Hughs 12/03/2014 showed two 3 to 5 mm tubular adenomatous polyps removed from the descending and ascending colon and mild diverticulosis to the left colon.  He was advised to repeat a colonoscopy July 2023.  Mr. Parekh remains quite active at the age of 45.  He mows his 7 acre yard on a regular basis.  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   Colonoscopy 12/03/2014 by Dr. Ardis Hughs: 1. Two sessile polyps ranging between 3-19m in size were found in the descending colon and ascending colon; polypectomies were performed with a cold snare 2. Mild diverticulosis was noted in the left colon 3. The examination was otherwise normal - TUBULAR ADENOMA (X2). - NO HIGH GRADE DYSPLASIA OR MALIGNANCY. - Recall changed to July 2023   Colonoscopy 07/08/2003 by Dr. SRachelle Hora Sigmoid diverticulosis No polyps    Current Medications, Allergies, Past Medical History, Past Surgical History, Family History and Social History were reviewed in CSchrieverrecord.   Review of Systems:   Constitutional: Negative for fever, sweats, chills or weight loss.  Respiratory: Negative for shortness of breath.   Cardiovascular: Negative  for chest pain, palpitations and leg swelling.  Gastrointestinal: See HPI.  Musculoskeletal: Negative for back pain or muscle aches.  Neurological: Negative for dizziness, headaches or paresthesias.    Physical Exam: There were no vitals taken for this visit. General: in no acute distress. Head: Normocephalic and atraumatic. Eyes: No scleral icterus. Conjunctiva pink . Ears: Normal auditory acuity. Mouth: Dentition intact. No ulcers or lesions.  Lungs: Clear throughout to auscultation. Heart: Regular rate and rhythm, no murmur. Abdomen: Soft, nontender and nondistended. No masses or hepatomegaly. Normal bowel sounds x 4 quadrants.  Rectal: *** Musculoskeletal: Symmetrical with no gross deformities. Extremities: No edema. Neurological: Alert oriented x 4. No focal deficits.  Psychological: Alert and cooperative. Normal mood and affect  Assessment and Recommendations:  17 77year old male with a history of two 3 to 5 mm tubular adenomatous polyps removed from the colon 11/2014. He is at higher risk for procedure/anesthesia complications secondary to age and multiple comorbidities.  However, at the age of 78he remains quite active and he accepts these risks as wishes to proceed with a colonoscopy to reduce the risk of developing colon cancer.   2) Significant history of CAD including history of MI, CABG and multiple PCI/PTCA/DES procedures as noted in the HPI.  On Plavix, aspirin, Imdur, Metoprolol, Ranexa, and Crestor.   3) History of prostate cancer s/p prostatectomy 2010

## 2022-05-27 ENCOUNTER — Other Ambulatory Visit: Payer: Self-pay

## 2022-05-27 ENCOUNTER — Encounter: Payer: Self-pay | Admitting: Emergency Medicine

## 2022-05-27 ENCOUNTER — Ambulatory Visit
Admission: EM | Admit: 2022-05-27 | Discharge: 2022-05-27 | Disposition: A | Discharge status: Home / Self Care | Payer: Medicare Other | Attending: Emergency Medicine | Admitting: Emergency Medicine

## 2022-05-27 DIAGNOSIS — Z87891 Personal history of nicotine dependence: Secondary | ICD-10-CM | POA: Diagnosis not present

## 2022-05-27 DIAGNOSIS — J069 Acute upper respiratory infection, unspecified: Secondary | ICD-10-CM

## 2022-05-27 DIAGNOSIS — U071 COVID-19: Secondary | ICD-10-CM | POA: Diagnosis not present

## 2022-05-27 LAB — POCT INFLUENZA A/B
Influenza A, POC: NEGATIVE
Influenza B, POC: NEGATIVE

## 2022-05-27 MED ORDER — ACETAMINOPHEN 325 MG PO TABS
650.0000 mg | ORAL_TABLET | Freq: Once | ORAL | Status: AC
  Administered 2022-05-27: 650 mg via ORAL

## 2022-05-27 MED ORDER — ALBUTEROL SULFATE HFA 108 (90 BASE) MCG/ACT IN AERS: 2.0000 | INHALATION_SPRAY | RESPIRATORY_TRACT | 0 refills | Status: DC | PRN

## 2022-05-27 MED ORDER — BENZONATATE 100 MG PO CAPS: 100.0000 mg | ORAL_CAPSULE | Freq: Three times a day (TID) | ORAL | 0 refills | Status: DC

## 2022-05-27 MED ORDER — PREDNISONE 20 MG PO TABS: 40.0000 mg | ORAL_TABLET | Freq: Every day | ORAL | 0 refills | Status: DC

## 2022-05-27 NOTE — ED Provider Notes (Signed)
[facility]    CSN: [id number] Arrival date & time: 05/27/22  1331      History   Chief Complaint Chief Complaint  Patient presents with   Fever    HPI SOPHEAP BOEHLE is a 77 y.o. male.   Patient presents for evaluation of fever, chills, nasal congestion, rhinorrhea, sore throat, cough, short of breath, wheeze and bodyaches beginning 2 days ago.  Fever peaking at 100.3.  Shortness of breath is experienced at rest worsened by exertion, wheezing can be heard throughout the day.  Cough is productive.  Tolerating food and liquids.  No sick contacts prior.  Has attempted use of Tylenol which has been minimally effective.  Former smoker.  Denies respiratory history.     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 levels LDL IIIa+b% and HDL2b%   Hypertension    Myocardial infarct [facility])    Myocardial infarction ([place]) 1994   inferior wall MI at [facility]G   Prostate cancer [facility])    Sleep apnea    uses CPAP PRN   Sleep apnea, obstructive    using C-PAP    Patient Active Problem List   Diagnosis Date Noted   Knee effusion, left 03/23/2020   History of adenomatous polyp of colon 12/10/2014   Anosmia 09/28/2014   Chronic renal insufficiency, stage III (moderate) ([place]) 02/05/2013   CAD - PCI 1980s, CABG X 4 1994, DES CFX 08/2008, stent 2014 nstemi 01/11/2013   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 RCA  collateralized   CORONARY ANGIOPLASTY WITH STENT PLACEMENT     CORONARY ARTERY BYPASS GRAFT  1994   EXPLORATORY LAPAROTOMY     ideopathic retroperitoneal fibrosis   LEFT HEART CATHETERIZATION WITH CORONARY/GRAFT ANGIOGRAM N/A 02/04/2013   Procedure: LEFT HEART CATHETERIZATION WITH Beatrix Fetters;  Surgeon: [name], MD;  Location: [facility] CATH LAB;  Service: Cardiovascular;  Laterality: N/A;   LEFT PAROTID GLAND SURGRY  61/4431   Dr. Erik Obey   PERCUTANEOUS CORONARY STENT INTERVENTION (PCI-S)  06/25/2008   rotational atherectomy w/diffuse disease native circ. w/drug eluting stents , PTCA on one vessel; grat occlusion to RCA w/native RCA proxim. occlusion. Circ.supplied collaaterals distal RCA  following intervention   PERCUTANEOUS CORONARY STENT INTERVENTION (PCI-S) N/A 02/05/2013   Procedure: PERCUTANEOUS CORONARY STENT INTERVENTION (PCI-S);  Surgeon: [name], MD;  Location: [facility] CATH LAB;  Service: Cardiovascular;  Laterality: N/A;   PROSTATECTOMY  06/2009   robotic-asssisted laparoscopic radical by Dr Rosana Hoes       Home Medications    Prior to Admission medications   Medication Sig Start Date End Date Taking? Authorizing Provider  amLODipine (NORVASC) 5 MG tablet TAKE 1 TABLET (5 MG TOTAL) BY MOUTH DAILY. 04/13/22   Deberah Pelton, NP  Ascorbic Acid (VITAMIN C  PO) Take by mouth.    [provider]  aspirin 81 MG tablet Take 81 mg by mouth daily.    [provider]  cholecalciferol (VITAMIN D) 1000 UNITS tablet Take 1,000 Units by mouth daily.    [provider]  clopidogrel (PLAVIX) 75 MG tablet TAKE 1 TABLET BY MOUTH EVERY DAY 02/23/22   [name], MD  Coenzyme Q10 (CO Q 10) 100 MG CAPS Take 300 mg by mouth every evening.    [provider]  dorzolamide (TRUSOPT) 2 % ophthalmic solution  SMARTSIG:In Eye(s)    [provider]  ezetimibe (ZETIA) 10 MG tablet TAKE 1 TABLET BY MOUTH EVERY DAY 04/13/22   Deberah Pelton, NP  isosorbide mononitrate (IMDUR) 60 MG 24 hr tablet TAKE 1 TABLET (60 MG TOTAL) BY MOUTH DAILY. NEED OV FOR FUTURE REFILL 01/12/22   [name], MD  metoprolol tartrate (LOPRESSOR) 25 MG tablet TAKE 1 TABLET BY MOUTH EVERY MORNING AND ONE HALF TABLETS EVERY EVENING.NEED APPT WITH FOR REFILLS 12/20/21   [name], MD  nitroGLYCERIN (NITROSTAT) 0.4 MG SL tablet Place 1 tablet (0.4 mg total) under the tongue every 5 (five) minutes as needed for chest pain. 10/22/16   [name], MD  omega-3 acid ethyl esters (LOVAZA) 1 g capsule TAKE 1 CAPSULE BY MOUTH 2 TIMES DAILY. 06/21/21   [name], MD  ranolazine (RANEXA) 500 MG 12 hr tablet TAKE 1 TABLET BY MOUTH TWICE A DAY 12/25/21   [name], MD  rosuvastatin (CRESTOR) 40 MG tablet TAKE 1 TABLET BY MOUTH EVERY DAY 01/19/22   [name], MD  valsartan (DIOVAN) 80 MG tablet TAKE 1 TABLET BY MOUTH EVERY DAY 04/13/22   Deberah Pelton, NP  vitamin B-12 (CYANOCOBALAMIN) 100 MCG tablet Take 100 mcg by mouth daily.     [provider]    Family History Family History  Problem Relation Age of Onset   Pancreatic cancer Father    Heart disease Father    Hypertension Father    Stroke Mother    Hypertension Mother    Coronary artery disease Brother 74       2 bros with early CAD   Hypertension Brother    Coronary artery disease Brother    Hypertension Brother    Coronary artery disease Brother    Hypertension Brother    Heart attack Maternal Grandfather    Hypertension Paternal Grandfather    Lupus Daughter    Hypertension Son    Hypertension Sister    Hypertension Sister    Throat cancer Brother    Colon cancer Neg Hx    Esophageal cancer Neg Hx    Stomach cancer Neg Hx    Rectal cancer Neg Hx     Social History Social History   Tobacco Use   Smoking status: Former     Types: Cigarettes    Quit date: 05/07/1973    Years since quitting: 49.0    Passive exposure: Never   Smokeless tobacco: Never  Vaping Use   Vaping Use: Never used  Substance Use Topics   Alcohol use: No   Drug use: No     Allergies   Patient has no known allergies.   Review of Systems Review of Systems  Constitutional:  Positive for chills and fever. Negative for appetite change, diaphoresis, fatigue and unexpected weight change.  HENT:  Positive for congestion, rhinorrhea and sore throat. Negative for dental problem, drooling,  ear discharge, ear pain, facial swelling, hearing loss, mouth sores, nosebleeds, postnasal drip, sinus pressure, sinus pain, sneezing, tinnitus, trouble swallowing and voice change.   Respiratory:  Positive for cough, shortness of breath and wheezing. Negative for apnea, choking, chest tightness and stridor.   Cardiovascular: Negative.   Gastrointestinal: Negative.   Musculoskeletal:  Positive for myalgias. Negative for arthralgias, back pain, gait problem, joint swelling, neck pain and neck stiffness.  Skin: Negative.      Physical Exam Triage Vital Signs ED Triage Vitals  Enc Vitals Group     BP 05/27/22 1407 (!) 140/75     Pulse Rate 05/27/22 1407 98     Resp 05/27/22 1407 18     Temp 05/27/22 1407 (!) 102.3 F (39.1 C)     Temp Source 05/27/22 1407 Oral     SpO2 05/27/22 1407 96 %     Weight --      Height --      Head Circumference --      Peak Flow --      Pain Score 05/27/22 1408 5     Pain Loc --      Pain Edu? --      Excl. in [place]? --    No data found.  Updated Vital Signs BP (!) 140/75 (BP Location: Left Arm)   Pulse 98   Temp (!) 102.3 F (39.1 C) (Oral)   Resp 18   SpO2 96%   Visual Acuity Right Eye Distance:   Left Eye Distance:   Bilateral Distance:    Right Eye Near:   Left Eye Near:    Bilateral Near:     Physical Exam Constitutional:      Appearance: Normal appearance.  HENT:     Head: Normocephalic.      Right Ear: Tympanic membrane, ear canal and external ear normal.     Left Ear: Tympanic membrane, ear canal and external ear normal.     Nose: Congestion and rhinorrhea present.     Mouth/Throat:     Mouth: Mucous membranes are moist.     Pharynx: No posterior oropharyngeal erythema.  Cardiovascular:     Rate and Rhythm: Normal rate and regular rhythm.     Pulses: Normal pulses.     Heart sounds: Normal heart sounds.  Pulmonary:     Effort: Pulmonary effort is normal.     Breath sounds: Normal breath sounds.  Skin:    General: Skin is warm and dry.  Neurological:     Mental Status: He is alert and oriented to person, place, and time. Mental status is at baseline.  Psychiatric:        Mood and Affect: Mood normal.        Behavior: Behavior normal.      UC Treatments / Results  Labs (all labs ordered are listed, but only abnormal results are displayed) Labs Reviewed  POCT INFLUENZA A/B    EKG   Radiology No results found.  Procedures Procedures (including critical care time)  Medications Ordered in UC Medications  acetaminophen (TYLENOL) tablet 650 mg (650 mg Oral Given 05/27/22 1413)    Initial Impression / Assessment and Plan / UC Course  I have reviewed the triage vital signs and the nursing notes.  Pertinent labs & imaging results that were available during my care of the patient were reviewed by me and considered in my medical decision making (see chart for details).  Viral URI with cough  Fever of 102.3  noted in triage, Tylenol given in office, otherwise stable, in no signs of distress nontoxic-appearing lungs are clear to auscultation therefore imaging deferred, as low suspicion for pneumonia or bronchitis, rapid flu testing negative, COVID test pending, discussed use of antivirals and quarantine if positive, prescribed prednisone Tessalon and albuterol inhaler in the meantime for outpatient management, may use additional over-the-counter medications for  supportive care with urgent care follow-up if symptoms persist or worsen Final Clinical Impressions(s) / UC Diagnoses   Final diagnoses:  None   Discharge Instructions   None    ED Prescriptions   None    PDMP not reviewed this encounter.   Hans Eden, NP 05/27/22 1520

## 2022-05-27 NOTE — Discharge Instructions (Addendum)
Your symptoms today are most likely being caused by a virus and should steadily improve in time it can take up to 7 to 10 days before you truly start to see a turnaround however things will get better  COVID test is pending, if positive you will be notified, if positive antibiotics were sent in at time of notification, if positive he will need to quarantine for an additional 3 days and may return activity on Thursday wearing mask if still having symptoms  To help protect your loved ones in the home, complete handwashing as needed and wipe down 5 surface areas if family member begins to have symptoms have not evaluated this and is possible  In the meantime we will help to manage her symptoms  Begin prednisone every morning with food for 5 days to help reduce inflammation and irritation to the airway, this will help to reduce your shortness of breath and wheezing  May use Tessalon pill every 8 hours to help calm your coughing as well as over-the-counter Delsym  You may take 2 puffs of albuterol inhaler taking as needed when feeling shortness of breath or having wheezing    You can take Tylenol and/or Ibuprofen as needed for fever reduction and pain relief.   For cough: honey 1/2 to 1 teaspoon (you can dilute the honey in water or another fluid).  You can also use guaifenesin and dextromethorphan for cough. You can use a humidifier for chest congestion and cough.  If you don't have a humidifier, you can sit in the bathroom with the hot shower running.      For sore throat: try warm salt water gargles, cepacol lozenges, throat spray, warm tea or water with lemon/honey, popsicles or ice, or OTC cold relief medicine for throat discomfort.   For congestion: take a daily anti-histamine like Zyrtec, Claritin, and a oral decongestant, such as pseudoephedrine.  You can also use Flonase 1-2 sprays in each nostril daily.   It is important to stay hydrated: drink plenty of fluids (water,  gatorade/powerade/pedialyte, juices, or teas) to keep your throat moisturized and help further relieve irritation/discomfort.

## 2022-05-27 NOTE — ED Triage Notes (Signed)
Pt here for cough and fever x 2 days

## 2022-05-28 ENCOUNTER — Telehealth: Payer: Self-pay | Admitting: *Deleted

## 2022-05-28 ENCOUNTER — Telehealth (HOSPITAL_COMMUNITY): Payer: Self-pay | Admitting: Emergency Medicine

## 2022-05-28 LAB — SARS CORONAVIRUS 2 (TAT 6-24 HRS): SARS Coronavirus 2: POSITIVE — AB

## 2022-05-28 MED ORDER — NIRMATRELVIR/RITONAVIR (PAXLOVID) TABLET (RENAL DOSING): 2.0000 | ORAL_TABLET | Freq: Two times a day (BID) | ORAL | 0 refills | Status: DC

## 2022-05-28 NOTE — Telephone Encounter (Signed)
Called patient to see if he is using his CPAP machine. Has appointment scheduled with Dr Claiborne Billings tomorrow. Patient states that he has not used it due to having a "cold." States that he was seen in at ED yesterday and was checked for the flu and COVID. Wonders if he should keep his appointment. Upon checking the chart it was determined that his COVID test was positive. He was notified of this. Recommended he contact his PCP to inform them of the results for recommendations since he has not heard back from the ED. Appointment for tomorrow has been cancelled. Message has been sent to [facility] to call the patient to reschedule.

## 2022-05-29 ENCOUNTER — Telehealth (HOSPITAL_COMMUNITY): Payer: Self-pay | Admitting: Emergency Medicine

## 2022-05-29 ENCOUNTER — Other Ambulatory Visit: Payer: Self-pay | Admitting: General Practice

## 2022-05-29 ENCOUNTER — Ambulatory Visit: Payer: Medicare Other | Admitting: Cardiovascular Disease

## 2022-05-29 ENCOUNTER — Other Ambulatory Visit: Payer: Self-pay | Admitting: Cardiovascular Disease

## 2022-05-29 MED ORDER — NIRMATRELVIR/RITONAVIR (PAXLOVID) TABLET (RENAL DOSING): 2.0000 | ORAL_TABLET | Freq: Two times a day (BID) | ORAL | 0 refills | Status: AC

## 2022-05-30 ENCOUNTER — Telehealth: Payer: Self-pay | Admitting: Cardiovascular Disease

## 2022-05-30 MED ORDER — ISOSORBIDE MONONITRATE ER 60 MG PO TB24: 60.0000 mg | ORAL_TABLET | Freq: Every day | ORAL | 1 refills | Status: DC

## 2022-05-30 NOTE — Telephone Encounter (Signed)
Spoke with pt, Refill sent to the pharmacy electronically.  

## 2022-05-30 NOTE — Telephone Encounter (Signed)
*  STAT* If patient is at the pharmacy, call can be transferred to refill team.   1. Which medications need to be refilled? (please list name of each medication and dose if known)  isosorbide mononitrate (IMDUR) 60 MG 24 hr tablet  2. Which pharmacy/location (including street and city if local pharmacy) is medication to be sent to? [id number] [place] [place] - [address]  3. Do they need a 30 day or 90 day supply?  90 day supply  Patient states he is completely out of medication.

## 2022-06-04 ENCOUNTER — Ambulatory Visit
Admission: EM | Admit: 2022-06-04 | Discharge: 2022-06-04 | Disposition: A | Discharge status: Home / Self Care | Payer: Medicare Other | Attending: Physician Assistant | Admitting: Physician Assistant

## 2022-06-04 DIAGNOSIS — J039 Acute tonsillitis, unspecified: Secondary | ICD-10-CM

## 2022-06-04 MED ORDER — AMOXICILLIN 500 MG PO CAPS: 500.0000 mg | ORAL_CAPSULE | Freq: Two times a day (BID) | ORAL | 0 refills | Status: DC

## 2022-06-04 NOTE — ED Triage Notes (Signed)
Pt c/o covid(+) x1 week ago. States his tonsils are swollen and painful + headache but otherwise sxs have resolved.   Recent use of prednisone

## 2022-06-05 NOTE — ED Provider Notes (Signed)
[facility]    CSN: [id number] Arrival date & time: 06/04/22  1036      History   Chief Complaint Chief Complaint  Patient presents with   tonsil pain    HPI Joshua Hahn is a 77 y.o. male.   Patient here today for evaluation of tonsillar pain that is been present for the last week.  He reports that he had COVID about a week ago but states cough and other symptoms have cleared except for sore throat and headache.  He has taken prednisone without resolution.  The history is provided by the patient.    Past Medical History:  Diagnosis Date   Chronic kidney disease (CKD), stage III (moderate) (HCC)    Coronary artery disease 1994   PCI'80s, CABG '94, CFX DES 4/10   Diverticulosis    HTN (hypertension) 10/16/2011   Echo -EF 50-55% ,LV NORMAL   Hyperlipidemia    2012 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 levels LDL IIIa+b% and HDL2b%   Hypertension    Myocardial infarct [facility])    Myocardial infarction ([place]) 1994   inferior wall MI at [facility]   Prostate cancer [facility])    Sleep apnea    uses CPAP PRN   Sleep apnea, obstructive    using C-PAP    Patient Active Problem List   Diagnosis Date Noted   Knee effusion, left 03/23/2020   History of adenomatous polyp of colon 12/10/2014   Anosmia 09/28/2014   Chronic renal insufficiency, stage III (moderate) ([place]) 02/05/2013   CAD - PCI 1980s, CABG X 4 1994, DES CFX 08/2008, stent 2014 nstemi 01/11/2013   Old MI (myocardial infarction) 01/11/2013   OSA on CPAP 01/11/2013   ADENOCARCINOMA, PROSTATE, 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 RCA  collateralized   CORONARY  ANGIOPLASTY WITH STENT PLACEMENT     CORONARY ARTERY BYPASS GRAFT  1994   EXPLORATORY LAPAROTOMY     ideopathic retroperitoneal fibrosis   LEFT HEART CATHETERIZATION WITH CORONARY/GRAFT ANGIOGRAM N/A 02/04/2013   Procedure: LEFT HEART CATHETERIZATION WITH Beatrix Fetters;  Surgeon: [name], MD;  Location: [facility] CATH LAB;  Service: Cardiovascular;  Laterality: N/A;   LEFT PAROTID GLAND SURGRY  25/9563   Dr. Erik Obey   PERCUTANEOUS CORONARY STENT INTERVENTION (PCI-S)  06/25/2008   rotational atherectomy w/diffuse disease native circ. w/drug eluting stents , PTCA on one vessel; grat occlusion to RCA w/native RCA proxim. occlusion. Circ.supplied collaaterals distal RCA  following intervention   PERCUTANEOUS CORONARY STENT INTERVENTION (PCI-S) N/A 02/05/2013   Procedure: PERCUTANEOUS CORONARY STENT INTERVENTION (PCI-S);  Surgeon: [name], MD;  Location: [facility];  Service: Cardiovascular;  Laterality: N/A;   PROSTATECTOMY  06/2009   robotic-asssisted laparoscopic radical by Dr Rosana Hoes       Home Medications    Prior to Admission medications   Medication Sig Start Date End Date Taking? Authorizing Provider  amoxicillin (AMOXIL) 500 MG capsule Take 1 capsule (500 mg total) by mouth 2 (two) times daily for 10 days. 06/04/22 06/14/22 Yes Francene Finders, PA-C  albuterol (VENTOLIN HFA) 108 (90 Base) MCG/ACT inhaler Inhale 2 puffs into the lungs every 4 (four) hours as needed  for wheezing or shortness of breath. 05/27/22   White, Adrienne R, NP  amLODipine (NORVASC) 5 MG tablet TAKE 1 TABLET (5 MG TOTAL) BY MOUTH DAILY. 04/13/22   Deberah Pelton, NP  Ascorbic Acid (VITAMIN C PO) Take by mouth.    [provider]  aspirin 81 MG tablet Take 81 mg by mouth daily.    [provider]  cholecalciferol (VITAMIN D) 1000 UNITS tablet Take 1,000 Units by mouth daily.    [provider]  clopidogrel (PLAVIX) 75 MG tablet TAKE 1 TABLET BY MOUTH EVERY DAY 02/23/22    [name], MD  Coenzyme Q10 (CO Q 10) 100 MG CAPS Take 300 mg by mouth every evening.    [provider]  dorzolamide (TRUSOPT) 2 % ophthalmic solution SMARTSIG:In Eye(s)    [provider]  ezetimibe (ZETIA) 10 MG tablet TAKE 1 TABLET BY MOUTH EVERY DAY 04/13/22   Deberah Pelton, NP  isosorbide mononitrate (IMDUR) 60 MG 24 hr tablet Take 1 tablet (60 mg total) by mouth daily. 05/30/22   [name], MD  metoprolol tartrate (LOPRESSOR) 25 MG tablet TAKE 1 TABLET BY MOUTH EVERY MORNING AND ONE HALF TABLETS EVERY EVENING.NEED APPT WITH FOR REFILLS 12/20/21   [name], MD  nitroGLYCERIN (NITROSTAT) 0.4 MG SL tablet Place 1 tablet (0.4 mg total) under the tongue every 5 (five) minutes as needed for chest pain. 10/22/16   [name], MD  omega-3 acid ethyl esters (LOVAZA) 1 g capsule TAKE 1 CAPSULE BY MOUTH TWICE A DAY 05/30/22   [name], MD  ranolazine (RANEXA) 500 MG 12 hr tablet TAKE 1 TABLET BY MOUTH TWICE A DAY 12/25/21   [name], MD  rosuvastatin (CRESTOR) 40 MG tablet Take 1 tablet (40 mg total) by mouth daily. Please keep scheduled appointment 05/30/22   [name], MD  valsartan (DIOVAN) 80 MG tablet TAKE 1 TABLET BY MOUTH EVERY DAY 05/30/22   Deberah Pelton, NP  vitamin B-12 (CYANOCOBALAMIN) 100 MCG tablet Take 100 mcg by mouth daily.     [provider]    Family History Family History  Problem Relation Age of Onset   Pancreatic cancer Father    Heart disease Father    Hypertension Father    Stroke Mother    Hypertension Mother    Coronary artery disease Brother 4       2 bros with early CAD   Hypertension Brother    Coronary artery disease Brother    Hypertension Brother    Coronary artery disease Brother    Hypertension Brother    Heart attack Maternal Grandfather    Hypertension Paternal Grandfather    Lupus Daughter    Hypertension Son    Hypertension Sister    Hypertension Sister    Throat cancer Brother     Colon cancer Neg Hx    Esophageal cancer Neg Hx    Stomach cancer Neg Hx    Rectal cancer Neg Hx     Social History Social History   Tobacco Use   Smoking status: Former    Types: Cigarettes    Quit date: 05/07/1973    Years since quitting: 49.1    Passive exposure: Never   Smokeless tobacco: Never  Vaping Use   Vaping Use: Never used  Substance Use Topics   Alcohol use: No   Drug use: No     Allergies   Patient has no known allergies.  Review of Systems Review of Systems  Constitutional:  Negative for chills and fever.  HENT:  Positive for sore throat. Negative for congestion and ear pain.   Eyes:  Negative for discharge and redness.  Respiratory:  Negative for cough and shortness of breath.   Gastrointestinal:  Negative for abdominal pain, nausea and vomiting.  Neurological:  Positive for headaches.     Physical Exam Triage Vital Signs ED Triage Vitals [06/04/22 1131]  Enc Vitals Group     BP 110/67     Pulse Rate 60     Resp 16     Temp 98 F (36.7 C)     Temp Source Oral     SpO2 98 %     Weight      Height      Head Circumference      Peak Flow      Pain Score 7     Pain Loc      Pain Edu?      Excl. in [place]?    No data found.  Updated Vital Signs BP 110/67 (BP Location: Left Arm)   Pulse 60   Temp 98 F (36.7 C) (Oral)   Resp 16   SpO2 98%      Physical Exam Vitals and nursing note reviewed.  Constitutional:      General: He is not in acute distress.    Appearance: Normal appearance. He is not ill-appearing.  HENT:     Head: Normocephalic and atraumatic.     Nose: Nose normal. No congestion.     Mouth/Throat:     Mouth: Mucous membranes are moist.     Pharynx: Oropharynx is clear. Posterior oropharyngeal erythema present. No oropharyngeal exudate.  Eyes:     Conjunctiva/sclera: Conjunctivae normal.  Cardiovascular:     Rate and Rhythm: Normal rate and regular rhythm.     Heart sounds: Normal heart sounds. No murmur  heard. Pulmonary:     Effort: Pulmonary effort is normal. No respiratory distress.     Breath sounds: Normal breath sounds. No wheezing, rhonchi or rales.  Skin:    General: Skin is warm and dry.  Neurological:     Mental Status: He is alert.  Psychiatric:        Mood and Affect: Mood normal.        Thought Content: Thought content normal.      UC Treatments / Results  Labs (all labs ordered are listed, but only abnormal results are displayed) Labs Reviewed - No data to display  EKG   Radiology No results found.  Procedures Procedures (including critical care time)  Medications Ordered in UC Medications - No data to display  Initial Impression / Assessment and Plan / UC Course  I have reviewed the triage vital signs and the nursing notes.  Pertinent labs & imaging results that were available during my care of the patient were reviewed by me and considered in my medical decision making (see chart for details).    Given continued tonsillitis despite resolution of other viral symptoms we will treat with antibiotic therapy to cover possible secondary infection.  Encouraged follow-up if no gradual improvement or with any further concerns.  Final Clinical Impressions(s) / UC Diagnoses   Final diagnoses:  Acute tonsillitis, unspecified etiology   Discharge Instructions   None    ED Prescriptions     Medication Sig Dispense Auth. Provider   amoxicillin (AMOXIL) 500 MG capsule Take 1 capsule (500 mg total)  by mouth 2 (two) times daily for 10 days. 20 capsule Francene Finders, PA-C      PDMP not reviewed this encounter.   Francene Finders, PA-C 06/05/22 [phone]

## 2022-06-10 NOTE — Progress Notes (Unsigned)
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 S/P exploratory laparotomy in 20 - 30 years ago secondary to idiopathic retroperitoneal fibrosis.  I last saw Joshua Hahn in office 07/25/2021 to schedule a colonoscopy.  At that time, his cardiac status was stable, however, he was past due to see his cardiologist.  A cardiac clearance with Plavix hold instructions were received 08/03/2021 and the patient was scheduled for a colonoscopy with Dr. Ardis Hughs which was subsequently canceled because he developed COVID.  He presents today to reschedule a colonoscopy.  He denies having any abdominal pain.  He is passing 1 or 2 formed brown bowel movements daily.  No rectal bleeding or black stools.  No GERD symptoms.  No chest pain, palpitations or dizziness.  No cough or shortness of breath.  He stated he feels like 1 million bucks.  He was seen in the ED 06/04/2022 due to having tonsillitis.  He was treated with antibiotics and his symptoms have improved.  He remains quite active with his church and family activities.      Latest Ref Rng & Units 02/06/2022   12:00 AM 11/29/2020   10:06 AM 09/29/2020    6:48 PM  CBC  WBC 3.4 - 10.8 x10E3/uL  6.1  7.2   Hemoglobin 13.5 - 17.5 14.2  14.3  14.0   Hematocrit 37.5 - 51.0 %  43.5  43.1   Platelets 150 - 450 x10E3/uL  179  169        Latest Ref Rng & Units 02/06/2022   12:00 AM 11/29/2020   10:06 AM 09/29/2020    6:48 PM  CMP   Glucose 65 - 99 mg/dL  110  117   BUN 4 - '21 19  15  17   '$ Creatinine 0.6 - 1.3 1.8  1.90  1.96   Sodium 137 - 147 137  139  133   Potassium 3.5 - 5.1 mEq/L 4.6  5.0  4.4   Chloride 99 - 108 104  103  101   CO2 13 - '22 27  24  25   '$ Calcium 8.7 - 10.7 10.2  9.5  9.0   Total Protein 6.0 - 8.5 g/dL  6.7    Total Bilirubin 0.0 - 1.2 mg/dL  0.6    Alkaline Phos 44 - 121 IU/L  74    AST 0 - 40 IU/L  36    ALT 0 - 44 IU/L  23       He underwent a Lexiscan Myoview study on May 17, 2020 which showed evidence of a prior inferior scar with minimal peri-infarction ischemia and very mild inferolateral ischemia most likely due to his distal small marginal branch stenosis.  PAST GI PROCEDURES:   Colonoscopy 12/03/2014 by Dr. Ardis Hughs: 1. Two sessile polyps ranging between 3-38m in size were found in the descending colon and ascending colon; polypectomies were performed with a cold snare 2. Mild diverticulosis was noted  in the left colon 3. The examination was otherwise normal - TUBULAR ADENOMA (X2). - NO HIGH GRADE DYSPLASIA OR MALIGNANCY. - Recall changed to July 2023   Colonoscopy 07/08/2003 by Dr. 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 levels LDL IIIa+b% and HDL2b%   Hypertension    Myocardial infarct [facility])    Myocardial infarction ([place]) 1994   inferior wall MI at [facility] hospitaland CABG   Prostate cancer [facility])    Sleep apnea    uses CPAP PRN   Sleep apnea, obstructive    using C-PAP   Past Surgical History:  Procedure Laterality Date   ABDOMINAL SURGERY     CARDIAC CATHETERIZATION  06/23/2008   occlusion  sequential OM1 andOM2 graft as well vein graft to RCA.LAD and diagonal system remain intact. RCA  collateralized   CORONARY ANGIOPLASTY WITH STENT PLACEMENT     CORONARY ARTERY BYPASS GRAFT  1994   EXPLORATORY LAPAROTOMY     ideopathic retroperitoneal fibrosis   LEFT HEART CATHETERIZATION WITH CORONARY/GRAFT ANGIOGRAM N/A 02/04/2013   Procedure: LEFT HEART CATHETERIZATION WITH Beatrix Fetters;  Surgeon: [name], MD;  Location: [facility] CATH LAB;  Service: Cardiovascular;  Laterality: N/A;   LEFT PAROTID GLAND SURGRY  71/2458   Dr. Erik Obey   PERCUTANEOUS CORONARY STENT INTERVENTION (PCI-S)  06/25/2008   rotational atherectomy w/diffuse disease native circ. w/drug eluting stents , PTCA on one vessel; grat occlusion to RCA w/native RCA proxim. occlusion. Circ.supplied collaaterals distal RCA  following intervention   PERCUTANEOUS CORONARY STENT INTERVENTION (PCI-S) N/A 02/05/2013   Procedure: PERCUTANEOUS CORONARY STENT INTERVENTION (PCI-S);  Surgeon: [name], MD;  Location: [facility] CATH LAB;  Service: Cardiovascular;  Laterality: N/A;   PROSTATECTOMY  06/2009   robotic-asssisted laparoscopic radical by Dr Rosana Hoes     Current Outpatient Medications on File Prior to Visit  Medication Sig Dispense Refill   amLODipine (NORVASC) 5 MG tablet TAKE 1 TABLET (5 MG TOTAL) BY MOUTH DAILY. 90 tablet 0   Ascorbic Acid (VITAMIN C PO) Take by mouth.     aspirin 81 MG tablet Take 81 mg by mouth daily.     cholecalciferol (VITAMIN D) 1000 UNITS tablet Take 1,000 Units by mouth daily.     clopidogrel (PLAVIX) 75 MG tablet TAKE 1 TABLET BY MOUTH EVERY DAY 90 tablet 3   Coenzyme Q10 (CO Q 10) 100 MG CAPS Take 300 mg by mouth every evening.     dorzolamide (TRUSOPT) 2 % ophthalmic solution SMARTSIG:In Eye(s)     ezetimibe (ZETIA) 10 MG tablet TAKE 1 TABLET BY MOUTH EVERY DAY 90 tablet 0   isosorbide mononitrate (IMDUR) 60 MG 24 hr tablet Take 1 tablet (60 mg total) by mouth daily. 90 tablet 1    metoprolol tartrate (LOPRESSOR) 25 MG tablet TAKE 1 TABLET BY MOUTH EVERY MORNING AND ONE HALF TABLETS EVERY EVENING.NEED APPT WITH FOR REFILLS 45 tablet 5   omega-3 acid ethyl esters (LOVAZA) 1 g capsule TAKE 1 CAPSULE BY MOUTH TWICE A DAY 60 capsule 2   ranolazine (RANEXA) 500 MG 12 hr tablet TAKE 1 TABLET BY MOUTH TWICE A DAY  180 tablet 3   rosuvastatin (CRESTOR) 40 MG tablet Take 1 tablet (40 mg total) by mouth daily. Please keep scheduled appointment 90 tablet 0   valsartan (DIOVAN) 80 MG tablet TAKE 1 TABLET BY MOUTH EVERY DAY 90 tablet 1   vitamin B-12 (CYANOCOBALAMIN) 100 MCG tablet Take 100 mcg by mouth daily.      nitroGLYCERIN (NITROSTAT) 0.4 MG SL tablet Place 1 tablet (0.4 mg total) under the tongue every 5 (five) minutes as needed for chest pain. (Patient not taking: Reported on 06/11/2022) 75 tablet 3   No current facility-administered medications on file prior to visit.   No Known Allergies  Current Medications, Allergies, Past Medical History, Past Surgical History, Family History and Social History were reviewed in [employer] record.  Review of Systems:   Constitutional: Negative for fever, sweats, chills or weight loss.  Respiratory: Negative for shortness of breath.   Cardiovascular: Negative for chest pain, palpitations and leg swelling.  Gastrointestinal: See HPI.  Musculoskeletal: Negative for back pain or muscle aches.  Neurological: Negative for dizziness, headaches or paresthesias.   Physical Exam: BP 128/60 (BP Location: Left Arm, Patient Position: Sitting, Cuff Size: Normal)   Pulse 72   Ht '5\' 8"'$  (1.727 m) Comment: height measured without shoes  Wt 217 lb 8 oz (98.7 kg)   BMI 33.07 kg/m   General: 77 year old male in no acute distress. Head: Normocephalic and atraumatic. Eyes: No scleral icterus. Conjunctiva pink . Ears: Normal auditory acuity. Mouth: Dentition intact. No ulcers or lesions.  Lungs: Clear throughout to  auscultation. Heart: Regular rate and rhythm, no murmur. Abdomen: Soft, nontender and nondistended. No masses or hepatomegaly. Normal bowel sounds x 4 quadrants.  Extensive midline abdominal scar intact.  Rectal: Deferred. Musculoskeletal: Symmetrical with no gross deformities. Extremities: No edema. Neurological: Alert oriented x 4. No focal deficits.  Psychological: Alert and cooperative. Normal mood and affect  Assessment and Recommendations:  73) 77 year old male with a history of two 3 to 5 mm tubular adenomatous polyps removed from the colon 11/2014 -Colonoscopy benefits and risks discussed including risk with sedation, risk of bleeding, perforation and infection  -Our office will contact the cardiologist Dr. Shelva Majestic to verify Plavix hold instructions prior to proceeding with a colonoscopy -Patient will contact our office if he has any change in his cardiac status prior to his colonoscopy date   2) Significant history of CAD including history of MI, CABG and multiple PCI/PTCA/DES procedures as noted in the HPI.  On Plavix, aspirin, Imdur, Metoprolol, Ranexa, and Crestor. -See Plan in # 1 regarding Plavix hold instructions prior to colonoscopy   3) History of prostate cancer s/p prostatectomy 2010  Further recommendations to be determined after colonoscopy completed

## 2022-06-11 ENCOUNTER — Telehealth: Payer: Self-pay | Admitting: *Deleted

## 2022-06-11 ENCOUNTER — Encounter: Payer: Self-pay | Admitting: Nurse Practitioner

## 2022-06-11 ENCOUNTER — Ambulatory Visit (INDEPENDENT_AMBULATORY_CARE_PROVIDER_SITE_OTHER): Payer: Medicare Other | Admitting: Nurse Practitioner

## 2022-06-11 ENCOUNTER — Telehealth: Payer: Self-pay

## 2022-06-11 VITALS — BP 128/60 | HR 72 | Ht 68.0 in | Wt 217.5 lb

## 2022-06-11 DIAGNOSIS — Z86010 Personal history of colonic polyps: Secondary | ICD-10-CM | POA: Diagnosis not present

## 2022-06-11 DIAGNOSIS — I25119 Atherosclerotic heart disease of native coronary artery with unspecified angina pectoris: Secondary | ICD-10-CM | POA: Diagnosis not present

## 2022-06-11 MED ORDER — NA SULFATE-K SULFATE-MG SULF 17.5-3.13-1.6 GM/177ML PO SOLN: 1.0000 | Freq: Once | ORAL | 0 refills | Status: AC

## 2022-06-11 NOTE — Telephone Encounter (Signed)
  Patient Consent for Virtual Visit         Joshua Hahn has provided verbal consent on 06/11/2022 for a virtual visit (video or telephone).   CONSENT FOR VIRTUAL VISIT FOR:  Joshua Hahn  By participating in this virtual visit I agree to the following:  I hereby voluntarily request, consent and authorize [facility] and its employed or contracted physicians, physician assistants, nurse practitioners or other licensed health care professionals (the Practitioner), to provide me with telemedicine health care services (the "Services") as deemed necessary by the treating Practitioner. I acknowledge and consent to receive the Services by the Practitioner via telemedicine. I understand that the telemedicine visit will involve communicating with the Practitioner through live audiovisual communication technology and the disclosure of certain medical information by electronic transmission. I acknowledge that I have been given the opportunity to request an in-person assessment or other available alternative prior to the telemedicine visit and am voluntarily participating in the telemedicine visit.  I understand that I have the right to withhold or withdraw my consent to the use of telemedicine in the course of my care at any time, without affecting my right to future care or treatment, and that the Practitioner or I may terminate the telemedicine visit at any time. I understand that I have the right to inspect all information obtained and/or recorded in the course of the telemedicine visit and may receive copies of available information for a reasonable fee.  I understand that some of the potential risks of receiving the Services via telemedicine include:  Delay or interruption in medical evaluation due to technological equipment failure or disruption; Information transmitted may not be sufficient (e.g. poor resolution of images) to allow for appropriate medical decision making by the  Practitioner; and/or  In rare instances, security protocols could fail, causing a breach of personal health information.  Furthermore, I acknowledge that it is my responsibility to provide information about my medical history, conditions and care that is complete and accurate to the best of my ability. I acknowledge that Practitioner's advice, recommendations, and/or decision may be based on factors not within their control, such as incomplete or inaccurate data provided by me or distortions of diagnostic images or specimens that may result from electronic transmissions. I understand that the practice of medicine is not an exact science and that Practitioner makes no warranties or guarantees regarding treatment outcomes. I acknowledge that a copy of this consent can be made available to me via my patient portal ([place]), or I can request a printed copy by calling the office of [facility].    I understand that my insurance will be billed for this visit.   I have read or had this consent read to me. I understand the contents of this consent, which adequately explains the benefits and risks of the Services being provided via telemedicine.  I have been provided ample opportunity to ask questions regarding this consent and the Services and have had my questions answered to my satisfaction. I give my informed consent for the services to be provided through the use of telemedicine in my medical care

## 2022-06-11 NOTE — Patient Instructions (Addendum)
_______________________________________________________  If your blood pressure at your visit was 140/90 or greater, please contact your primary care physician to follow up on this.  _______________________________________________________  If you are age 78 or older, your body mass index should be between 23-30. Your Body mass index is 33.07 kg/m. If this is out of the aforementioned range listed, please consider follow up with your Primary Care Provider.  If you are age 54 or younger, your body mass index should be between 19-25. Your Body mass index is 33.07 kg/m. If this is out of the aformentioned range listed, please consider follow up with your Primary Care Provider.   ________________________________________________________  The [facility] providers would like to encourage you to use [facility] to communicate with providers for non-urgent requests or questions.  Due to long hold times on the telephone, sending your provider a message by [facility] may be a faster and more efficient way to get a response.  Please allow 48 business hours for a response.  Please remember that this is for non-urgent requests.  _______________________________________________________  Dennis Bast have been scheduled for a colonoscopy. Please follow written instructions given to you at your visit today.  Please pick up your prep supplies at the pharmacy within the next 1-3 days. If you use inhalers (even only as needed), please bring them with you on the day of your procedure.  We have sent the following medications to your pharmacy for you to pick up at your convenience: Suprep please call us if too expensive we can send the tablets in  You will be contacted by our office prior to your procedure for directions on holding your Plavix.  If you do not hear from our office 2 week prior to your scheduled procedure, please call [phone] to discuss.   Thank you for trusting me with your gastrointestinal care!   Carl Best, CRNP

## 2022-06-11 NOTE — Progress Notes (Signed)
Addendum: Reviewed and agree with assessment and management plan. Veverly Larimer M, MD  

## 2022-06-11 NOTE — Telephone Encounter (Signed)
   Name: Joshua Hahn  DOB: [birth year]  MRN: [id number]  Primary Cardiologist: Shelva Majestic, MD   Preoperative team, please contact this patient and set up a phone call appointment for further preoperative risk assessment. Please obtain consent and complete medication review. Thank you for your help.  I confirm that guidance regarding antiplatelet and oral anticoagulation therapy has been completed and, if necessary, noted below.  Per office protocol, if patient is without any new symptoms or concerns at the time of their virtual visit, he may hold Plavix for 5 days prior to procedure. Please resume Plavix as soon as possible postprocedure, at the discretion of the surgeon.   Lenna Sciara, NP 06/11/2022, 10:37 AM [place]

## 2022-06-11 NOTE — Telephone Encounter (Signed)
[facility] Pre-operative Risk Assessment     Request for surgical clearance:     Endoscopy Procedure  What type of surgery is being performed?     Colonoscopy   When is this surgery scheduled?     07-16-2022  What type of clearance is required ?   Pharmacy  Are there any medications that need to be held prior to surgery and how long? Plavix 5 day hold  Practice name and name of physician performing surgery?      [facility]  What is your office phone and fax number?      Phone- [phone]  [phone]  Anesthesia type (None, local, MAC, general) ?       MAC

## 2022-06-12 DIAGNOSIS — H401132 Primary open-angle glaucoma, bilateral, moderate stage: Secondary | ICD-10-CM | POA: Diagnosis not present

## 2022-06-12 DIAGNOSIS — H25813 Combined forms of age-related cataract, bilateral: Secondary | ICD-10-CM | POA: Diagnosis not present

## 2022-06-12 DIAGNOSIS — H04123 Dry eye syndrome of bilateral lacrimal glands: Secondary | ICD-10-CM | POA: Diagnosis not present

## 2022-06-12 DIAGNOSIS — H35031 Hypertensive retinopathy, right eye: Secondary | ICD-10-CM | POA: Diagnosis not present

## 2022-06-21 ENCOUNTER — Ambulatory Visit (INDEPENDENT_AMBULATORY_CARE_PROVIDER_SITE_OTHER): Payer: Medicare Other | Admitting: Physician Assistant

## 2022-06-21 ENCOUNTER — Telehealth: Payer: Self-pay | Admitting: Pharmacist

## 2022-06-21 DIAGNOSIS — Z01810 Encounter for preprocedural cardiovascular examination: Secondary | ICD-10-CM

## 2022-06-21 NOTE — Progress Notes (Signed)
Care Management & Coordination Services Pharmacy Team  Reason for Encounter: General adherence update   Contacted patient for general health update and medication adherence call.  Spoke with family on 06/26/2022    What concerns do you have about your medications? None  The patient denies side effects with their medications.   How often do you forget or accidentally miss a dose? Rarely  Do you use a pillbox? No  Are you having any problems getting your medications from your pharmacy? No  Has the cost of your medications been a concern? No If yes, what medication and is patient assistance available or has it been applied for?  Since last visit with PharmD, no interventions have been made.   The patient has had an ED visit since last contact.   The patient denies problems with their health.   Patient denies concerns or questions for Leata Mouse, PharmD at this time.   Counseled patient on: Benefits of adherence packaging or a pillbox and Access to carecoordination team for any cost, medication or pharmacy concerns.  Patient's wife states the patient has not had his cholesterol checked recently.   Star Rating Drugs:  Rosuvastatin 40 mg last filled 04/19/2022 90 DS Valsartan 80 mg last filled 05/30/2022 90 DS   Chart Updates: Recent office visits:  None  Recent consult visits:  06/11/2021 OV Gertie Fey) Carl Best M, NP; no medication changes indicated.  Hospital visits:  06/04/2022 ED visit for Acute tonsillitis  -Rx amoxicillin 500 mg twice daily for 10 days  05/27/2022 ED visit for Viral URI with cough prescribed prednisone Tessalon and albuterol inhaler in the meantime for outpatient management, may use additional over-the-counter medications for supportive care with urgent care follow-up if symptoms persist or worsen    Care Gaps: Annual wellness visit in last year? Yes   Medications: Outpatient Encounter Medications as of 06/21/2022  Medication Sig    Ascorbic Acid (VITAMIN C PO) Take by mouth.   aspirin 81 MG tablet Take 81 mg by mouth daily.   cholecalciferol (VITAMIN D) 1000 UNITS tablet Take 1,000 Units by mouth daily.   clopidogrel (PLAVIX) 75 MG tablet TAKE 1 TABLET BY MOUTH EVERY DAY   Coenzyme Q10 (CO Q 10) 100 MG CAPS Take 300 mg by mouth every evening.   dorzolamide (TRUSOPT) 2 % ophthalmic solution SMARTSIG:In Eye(s)   ezetimibe (ZETIA) 10 MG tablet TAKE 1 TABLET BY MOUTH EVERY DAY   isosorbide mononitrate (IMDUR) 60 MG 24 hr tablet Take 1 tablet (60 mg total) by mouth daily.   metoprolol tartrate (LOPRESSOR) 25 MG tablet TAKE 1 TABLET BY MOUTH EVERY MORNING AND ONE HALF TABLETS EVERY EVENING.NEED APPT WITH FOR REFILLS   nitroGLYCERIN (NITROSTAT) 0.4 MG SL tablet Place 1 tablet (0.4 mg total) under the tongue every 5 (five) minutes as needed for chest pain.   omega-3 acid ethyl esters (LOVAZA) 1 g capsule TAKE 1 CAPSULE BY MOUTH TWICE A DAY   ranolazine (RANEXA) 500 MG 12 hr tablet TAKE 1 TABLET BY MOUTH TWICE A DAY   rosuvastatin (CRESTOR) 40 MG tablet Take 1 tablet (40 mg total) by mouth daily. Please keep scheduled appointment   valsartan (DIOVAN) 80 MG tablet TAKE 1 TABLET BY MOUTH EVERY DAY   vitamin B-12 (CYANOCOBALAMIN) 100 MCG tablet Take 100 mcg by mouth daily.    No facility-administered encounter medications on file as of 06/21/2022.    Recent Office Vitals: BP Readings from Last 3 Encounters:  06/11/22 128/60  06/04/22 110/67  05/27/22 (!) 140/75   Pulse Readings from Last 3 Encounters:  06/11/22 72  06/04/22 60  05/27/22 98    Wt Readings from Last 3 Encounters:  06/11/22 217 lb 8 oz (98.7 kg)  04/05/22 210 lb (95.3 kg)  08/14/21 210 lb (95.3 kg)     Future Appointments  Date Time Provider [place]  06/29/2022  8:50 AM Lendon Colonel, NP [facility] None  07/16/2022  3:00 PM Pyrtle, Lajuan Lines, MD [facility]  09/21/2022  4:20 PM [name], MD [facility] None  04/11/2023 11:45 AM  [facility] HEALTH COACH [facility]  04/19/2023 11:30 AM Edythe Clarity, [place] None   April D Calhoun, [place] Pharmacist Assistant [phone]

## 2022-06-21 NOTE — Progress Notes (Signed)
   Name: Joshua Hahn  DOB: [birth year]  MRN: [id number]  Primary Cardiologist: Shelva Majestic, MD  Chart reviewed as part of pre-operative protocol coverage. Because of [name]'s past medical history and time since last visit, he will require a follow-up in-office visit in order to better assess preoperative cardiovascular risk.  Pt was set up for telehealth visit for preoperative risk assessment for colonoscopy. However, pt has not been seen in the office since 2022.  Pre-op covering staff: - Please schedule appointment and call patient to inform them. If patient already had an upcoming appointment within acceptable timeframe, please add "pre-op clearance" to the appointment notes so provider is aware. - Please contact requesting surgeon's office via preferred method (i.e, phone, fax) to inform them of need for appointment prior to surgery.   Per Dr. Claiborne Billings in 2023, he may hold plavix for 5 days.    [place], PA  06/21/2022, 10:07 AM

## 2022-06-21 NOTE — Progress Notes (Signed)
I s/w the pt and advised he needs in office appt as he was last seen 2022 in office. Pt agreeable to plan of care. Pt has been scheduled to see Jory Sims , DNP 06/29/22 @ 8:50 at the [facility] office. Pt thanked me for the help. I assured the pt that I will update the requesting office as well.

## 2022-06-26 NOTE — Progress Notes (Signed)
Cardiology Clinic Note   Patient Name: Joshua Hahn Date of Encounter: 06/29/2022  Primary Care Provider:  Marin Olp, MD Primary Cardiologist:  Shelva Majestic, MD  Patient Profile    77  y/o male with a h/o CKD stage III, coronary artery disease with PCI in 1980s, CABG 94, circumflex DES 4/10, HLD, HTN , OSA on CPAP, prostate cancer. He remains on Plavix.  Last seen via telemedicine by Coletta Memos, FNP on 08/03/2021.   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 levels LDL IIIa+b% and HDL2b%   Hypertension    Myocardial infarct [facility])    Myocardial infarction ([place]) 1994   inferior wall [facility] at [facility]G   Prostate cancer [facility])    Sleep apnea    uses CPAP PRN   Sleep apnea, obstructive    using C-PAP   Past Surgical History:  Procedure Laterality Date   ABDOMINAL SURGERY     CARDIAC CATHETERIZATION  06/23/2008   occlusion sequential OM1 andOM2 graft as well vein graft to RCA.LAD and diagonal system remain intact. RCA  collateralized   CORONARY ANGIOPLASTY WITH STENT PLACEMENT     CORONARY ARTERY BYPASS GRAFT  1994   EXPLORATORY LAPAROTOMY     ideopathic retroperitoneal fibrosis   LEFT HEART CATHETERIZATION WITH CORONARY/GRAFT ANGIOGRAM N/A 02/04/2013   Procedure: LEFT HEART CATHETERIZATION WITH Beatrix Fetters;  Surgeon: [name], MD;  Location: [facility] CATH LAB;  Service: Cardiovascular;  Laterality: N/A;   LEFT PAROTID GLAND SURGRY  [id number]   Dr. Erik Obey   PERCUTANEOUS CORONARY STENT INTERVENTION (PCI-S)  06/25/2008   rotational atherectomy w/diffuse disease native circ. w/drug eluting stents , PTCA on  one vessel; grat occlusion to RCA w/native RCA proxim. occlusion. Circ.supplied collaaterals distal RCA  following intervention   PERCUTANEOUS CORONARY STENT INTERVENTION (PCI-S) N/A 02/05/2013   Procedure: PERCUTANEOUS CORONARY STENT INTERVENTION (PCI-S);  Surgeon: [name], MD;  Location: [facility] CATH LAB;  Service: Cardiovascular;  Laterality: N/A;   PROSTATECTOMY  06/2009   robotic-asssisted laparoscopic radical by Dr Rosana Hoes    Allergies  No Known Allergies  History of Present Illness    Joshua Hahn is a very pleasant gentleman who comes today for pre-operative cardiac evaluation or colonoscopy, on July 16, 2022, with Dr. Ardis Hughs.  [facility].  He was previously scheduled to have colonoscopy however he was diagnosed with COVID and procedure had to be canceled.  He is 3 weeks post recovery.  He is on Plavix and recommendations for when to hold or requested by GI.  With history of coronary artery disease and PCI along with CABG and most recent intervention to the circumflex with a DES in April 2010 he appears to be doing very well.  He remains very active.  He has had of [employer] and travels a good bit, but plans to retire in July 2024.  He denies any chest discomfort, shortness of breath, (with the exception of having had COVID), has recovered nicely, works with a Physiological scientist, eats healthy, and is able to work 14-hour days without difficulty.  He denies any bleeding issues on Plavix to  include hemoptysis, epistaxis, or melena.  He is due for annual follow-up labs.  Home Medications    Current Outpatient Medications  Medication Sig Dispense Refill   Ascorbic Acid (VITAMIN C PO) Take by mouth.     aspirin 81 MG tablet Take 81 mg by mouth daily.     cholecalciferol (VITAMIN D) 1000 UNITS tablet Take 1,000 Units by mouth daily.     clopidogrel (PLAVIX) 75 MG tablet TAKE 1 TABLET BY MOUTH EVERY DAY 90 tablet 3   Coenzyme Q10 (CO Q 10) 100 MG CAPS Take 300 mg by mouth  every evening.     dorzolamide (TRUSOPT) 2 % ophthalmic solution SMARTSIG:In Eye(s)     ezetimibe (ZETIA) 10 MG tablet TAKE 1 TABLET BY MOUTH EVERY DAY 90 tablet 0   isosorbide mononitrate (IMDUR) 60 MG 24 hr tablet Take 1 tablet (60 mg total) by mouth daily. 90 tablet 1   metoprolol tartrate (LOPRESSOR) 25 MG tablet TAKE 1 TABLET BY MOUTH EVERY MORNING AND ONE HALF TABLETS EVERY EVENING.NEED APPT WITH FOR REFILLS 45 tablet 5   nitroGLYCERIN (NITROSTAT) 0.4 MG SL tablet Place 1 tablet (0.4 mg total) under the tongue every 5 (five) minutes as needed for chest pain. 75 tablet 3   omega-3 acid ethyl esters (LOVAZA) 1 g capsule TAKE 1 CAPSULE BY MOUTH TWICE A DAY 60 capsule 2   ranolazine (RANEXA) 500 MG 12 hr tablet TAKE 1 TABLET BY MOUTH TWICE A DAY 180 tablet 3   rosuvastatin (CRESTOR) 40 MG tablet Take 1 tablet (40 mg total) by mouth daily. Please keep scheduled appointment 90 tablet 0   valsartan (DIOVAN) 80 MG tablet TAKE 1 TABLET BY MOUTH EVERY DAY 90 tablet 1   vitamin B-12 (CYANOCOBALAMIN) 100 MCG tablet Take 100 mcg by mouth daily.      No current facility-administered medications for this visit.     Family History    Family History  Problem Relation Age of Onset   Pancreatic cancer Father    Heart disease Father    Hypertension Father    Stroke Mother    Hypertension Mother    Coronary artery disease Brother 19       2 bros with early CAD   Hypertension Brother    Coronary artery disease Brother    Hypertension Brother    Coronary artery disease Brother    Hypertension Brother    Heart attack Maternal Grandfather    Hypertension Paternal Grandfather    Lupus Daughter    Hypertension Son    Hypertension Sister    Hypertension Sister    Throat cancer Brother    Colon cancer Neg Hx    Esophageal cancer Neg Hx    Stomach cancer Neg Hx    Rectal cancer Neg Hx    He indicated that his mother is deceased. He indicated that his father is deceased. He indicated that all of  his four sisters are alive. He indicated that three of his four brothers are alive. He indicated that his maternal grandmother is deceased. He indicated that his maternal grandfather is deceased. He indicated that his paternal grandmother is deceased. He indicated that his paternal grandfather is deceased. He indicated that his daughter is alive. He indicated that his son is alive. He indicated that the status of his neg hx is unknown.  Social History    Social History   Socioeconomic History   Marital status: Married    Spouse name: Not on file  Number of children: 2   Years of education: Not on file   Highest education level: Not on file  Occupational History   Occupation: Theme park manager -retired     Comment: [employer]   Tobacco Use   Smoking status: Former    Types: Cigarettes    Quit date: 05/07/1973    Years since quitting: 49.1    Passive exposure: Never   Smokeless tobacco: Never  Vaping Use   Vaping Use: Never used  Substance and Sexual Activity   Alcohol use: No   Drug use: No   Sexual activity: Not Currently  Other Topics Concern   Not on file  Social History Narrative   ** Merged History Encounter **       Married 48 years in 2016. 2 children. 5 grand kids.   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   Hobbies: speaking events, traveling   Social Determinants of Health   Financial Resource Strain: Low Risk  (04/05/2022)   Overall Financial Resource Strain (CARDIA)    Difficulty of Paying Living Expenses: Not hard at all  Food Insecurity: No Food Insecurity (04/05/2022)   Hunger Vital Sign    Worried About Running Out of Food in the Last Year: Never true    Ran Out of Food in the Last Year: Never true  Transportation Needs: No Transportation Needs (04/05/2022)   PRAPARE - Hydrologist (Medical): No    Lack of Transportation (Non-Medical): No  Physical Activity: Sufficiently Active (04/05/2022)   Exercise Vital  Sign    Days of Exercise per Week: 2 days    Minutes of Exercise per Session: 120 min  Stress: No Stress Concern Present (04/05/2022)   [place]    Feeling of Stress : Not at all  Social Connections: [place] (04/05/2022)   Social Connection and Isolation Panel [NHANES]    Frequency of Communication with Friends and Family: More than three times a week    Frequency of Social Gatherings with Friends and Family: More than three times a week    Attends Religious Services: More than 4 times per year    Active Member of [employer] or Organizations: Yes    Attends Archivist Meetings: 1 to 4 times per year    Marital Status: Married  Human resources officer Violence: Not At Risk (04/05/2022)   Humiliation, Afraid, Rape, and Kick questionnaire    Fear of Current or Ex-Partner: No    Emotionally Abused: No    Physically Abused: No    Sexually Abused: No     Review of Systems    General:  No chills, fever, night sweats or weight changes.  Cardiovascular:  No chest pain, dyspnea on exertion, edema, orthopnea, palpitations, paroxysmal nocturnal dyspnea. Dermatological: No rash, lesions/masses Respiratory: No cough, dyspnea Urologic: No hematuria, dysuria Abdominal:   No nausea, vomiting, diarrhea, bright red blood per rectum, melena, or hematemesis Neurologic:  No visual changes, wkns, changes in mental status. All other systems reviewed and are otherwise negative except as noted above.     Physical Exam    VS:  BP 138/72   Pulse (!) 59   Ht '5\' 10"'$  (1.778 m)   Wt 218 lb 9.6 oz (99.2 kg)   SpO2 97%   BMI 31.37 kg/m  , BMI Body mass index is 31.37 kg/m.     GEN: Well nourished, well developed, in no acute distress.  HEENT: normal. Neck: Supple, no JVD, carotid bruits, or masses. Cardiac: RRR, no murmurs, rubs, or gallops. No clubbing, cyanosis, edema.  Radials/DP/PT 2+ and equal bilaterally.  Respiratory:   Respirations regular and unlabored, clear to auscultation bilaterally. GI: Soft, nontender, nondistended, BS + x 4. MS: no deformity or atrophy. Skin: warm and dry, no rash. Neuro:  Strength and sensation are intact. Psych: Normal affect.  Accessory Clinical Findings    ECG personally reviewed by me today-sinus bradycardia with first-degree AV block, inferior infarct is noted.  Heart rate of 59 bpm.- No acute changes from July 2023.  Lab Results  Component Value Date   WBC 6.1 11/29/2020   HGB 14.2 02/06/2022   HCT 43.5 11/29/2020   MCV 89 11/29/2020   PLT 179 11/29/2020   Lab Results  Component Value Date   CREATININE 1.8 (A) 02/06/2022   BUN 19 02/06/2022   NA 137 02/06/2022   K 4.6 02/06/2022   CL 104 02/06/2022   CO2 27 (A) 02/06/2022   Lab Results  Component Value Date   ALT 23 11/29/2020   AST 36 11/29/2020   ALKPHOS 74 11/29/2020   BILITOT 0.6 11/29/2020   Lab Results  Component Value Date   CHOL 113 11/29/2020   HDL 39 (L) 11/29/2020   LDLCALC 54 11/29/2020   LDLDIRECT 66.0 01/09/2018   TRIG 107 11/29/2020   CHOLHDL 2.9 11/29/2020    Lab Results  Component Value Date   HGBA1C 7.0 (H) 04/28/2020    Review of Prior Studies: NM Stress Test 05/03/2020 The left ventricular ejection fraction is mildly decreased (45-54%). Nuclear stress EF: 46%. There was no ST segment deviation noted during stress. There is a medium defect of moderate severity present in the basal inferior, basal inferolateral, mid inferior and mid inferolateral location. The defect is partially reversible in the inferior wall and fully reversible in the inferolateral wall consistent with prior infarct and peri infarct in the basal and mid inferior walls and new ischemia in the inferolateral walls. This is an intermediate risk study.  Assessment & Plan   1.  Pre-Operative Cardiac Evaluation:  Chart reviewed as part of pre-operative protocol coverage. Given past medical history and time  since last visit, based on ACC/AHA guidelines, Joshua Hahn would be at acceptable risk for the planned procedure without further cardiovascular testing.  He is advised to hold Plavix on July 11, 2022, 5 days prior to colonoscopy scheduled on July 16, 2022.  He will resume this ASAP once hemodynamically stable at the discretion of GI physician.  This chart is being CCed to Dr. Ardis Hughs.   2.  Coronary artery disease: History of CABG in 1994, PCI to unknown artery in the 1980s, with most recent PCI with drug-eluting stent in April 2010.  He is completely asymptomatic.  Remains very active, traveling, running [employer] [place] the [place], working out with the trainers, and working 14-hour days.  He is medically compliant.  Follow-up annual labs are ordered: CMET, CBC, fasting lipid profile.  Continue secondary management, to include metoprolol 25 mg in the morning and a half a tablet in the evening, DAPT, nitrates, and ranolazine with statin therapy.  3.  Hypercholesterolemia: Fasting lipids and LFTs will be ordered.  Goal of LDL less than 50, continue rosuvastatin 40 mg daily with no complaints of myalgia pain.  4.  Hypertension: Blood pressure is well-controlled, he remains on Diovan and isosorbide.  C-Met is ordered.  5.  OSA: Remains compliant with exception  of during the time of COVID infection.  He has recovered and has resumed use.  Current medicines are reviewed at length with the patient today.  I have spent 25 min's  dedicated to the care of this patient on the date of this encounter to include pre-visit review of records, assessment, management and diagnostic testing,with shared decision making. Signed, Phill Myron. West Pugh, ANP, AACC   06/29/2022 9:29 AM      Office [phone] Fax [phone]  Notice: This dictation was prepared with Dragon dictation along with smaller phrase technology. Any transcriptional errors that result from this process are unintentional and may not  be corrected upon review.

## 2022-06-29 ENCOUNTER — Encounter: Payer: Self-pay | Admitting: Adult Health

## 2022-06-29 ENCOUNTER — Ambulatory Visit: Payer: Medicare Other | Attending: Adult Health | Admitting: Adult Health

## 2022-06-29 VITALS — BP 138/72 | HR 59 | Ht 70.0 in | Wt 218.6 lb

## 2022-06-29 DIAGNOSIS — I25119 Atherosclerotic heart disease of native coronary artery with unspecified angina pectoris: Secondary | ICD-10-CM | POA: Diagnosis not present

## 2022-06-29 DIAGNOSIS — Z01818 Encounter for other preprocedural examination: Secondary | ICD-10-CM | POA: Diagnosis not present

## 2022-06-29 DIAGNOSIS — I10 Essential (primary) hypertension: Secondary | ICD-10-CM | POA: Diagnosis not present

## 2022-06-29 DIAGNOSIS — I25810 Atherosclerosis of coronary artery bypass graft(s) without angina pectoris: Secondary | ICD-10-CM | POA: Diagnosis not present

## 2022-06-29 DIAGNOSIS — G4733 Obstructive sleep apnea (adult) (pediatric): Secondary | ICD-10-CM | POA: Insufficient documentation

## 2022-06-29 DIAGNOSIS — E785 Hyperlipidemia, unspecified: Secondary | ICD-10-CM | POA: Insufficient documentation

## 2022-06-29 NOTE — Patient Instructions (Signed)
Medication Instructions:  No Changes *If you need a refill on your cardiac medications before your next appointment, please call your pharmacy*   Lab Work: CMP,CBC, Lipid Panel. 1-2 weeks If you have labs (blood work) drawn today and your tests are completely normal, you will receive your results only by: [place] (if you have MyChart) OR A paper copy in the mail If you have any lab test that is abnormal or we need to change your treatment, we will call you to review the results.   Testing/Procedures: No Testing   Follow-Up: At [facility], you and your health needs are our priority.  As part of our continuing mission to provide you with exceptional heart care, we have created designated Provider Care Teams.  These Care Teams include your primary Cardiologist (physician) and Advanced Practice Providers (APPs -  Physician Assistants and Nurse Practitioners) who all work together to provide you with the care you need, when you need it.  We recommend signing up for the patient portal called "MyChart".  Sign up information is provided on this After Visit Summary.  MyChart is used to connect with patients for Virtual Visits (Telemedicine).  Patients are able to view lab/test results, encounter notes, upcoming appointments, etc.  Non-urgent messages can be sent to your provider as well.   To learn more about what you can do with MyChart, go to [email or web address].    Your next appointment:   Keep Scheduled Appointment  Provider:   Shelva Majestic, MD    Other Instructions Stop Plavix March 6 th. Resume Plavix following instructions from GI.

## 2022-06-29 NOTE — Telephone Encounter (Signed)
Touch bases with the patient and he knows about when to stop his blood thinner

## 2022-07-16 ENCOUNTER — Encounter: Payer: Self-pay | Admitting: Internal Medicine

## 2022-07-16 ENCOUNTER — Ambulatory Visit (AMBULATORY_SURGERY_CENTER): Payer: Medicare Other | Admitting: Internal Medicine

## 2022-07-16 VITALS — BP 117/66 | HR 51 | Temp 98.7°F | Resp 12 | Ht 68.0 in | Wt 217.0 lb

## 2022-07-16 DIAGNOSIS — Z09 Encounter for follow-up examination after completed treatment for conditions other than malignant neoplasm: Secondary | ICD-10-CM

## 2022-07-16 DIAGNOSIS — D122 Benign neoplasm of ascending colon: Secondary | ICD-10-CM | POA: Diagnosis not present

## 2022-07-16 DIAGNOSIS — Z86010 Personal history of colonic polyps: Secondary | ICD-10-CM | POA: Diagnosis not present

## 2022-07-16 DIAGNOSIS — D124 Benign neoplasm of descending colon: Secondary | ICD-10-CM

## 2022-07-16 MED ORDER — SODIUM CHLORIDE 0.9 % IV SOLN: 500.0000 mL | INTRAVENOUS | Status: DC

## 2022-07-16 NOTE — Progress Notes (Signed)
GASTROENTEROLOGY PROCEDURE H&P NOTE   Primary Care Physician: Marin Olp, MD    Reason for Procedure:  History of adenomatous colon polyps  Plan:    Surveillance colonoscopy  Patient is appropriate for endoscopic procedure(s) in the ambulatory ([place]) setting.  The nature of the procedure, as well as the risks, benefits, and alternatives were carefully and thoroughly reviewed with the patient. Ample time for discussion and questions allowed. The patient understood, was satisfied, and agreed to proceed.     HPI: Joshua Hahn is a 77 y.o. male who presents for surveillance colonoscopy.  Medical history as below.  Tolerated the prep.  No recent chest pain or shortness of breath.  No abdominal pain today..  Seen in the clinic on 06/11/2022 by Carl Best, NP.  Plavix on hold x 5 days.  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 levels LDL IIIa+b% and HDL2b%   Hypertension    Myocardial infarct [facility])    Myocardial infarction ([place]) 1994   inferior wall MI at [facility]   Prostate cancer [facility])    Sleep apnea    uses CPAP PRN   Sleep apnea, obstructive    using C-PAP    Past Surgical History:  Procedure Laterality Date   ABDOMINAL SURGERY     CARDIAC CATHETERIZATION  06/23/2008   occlusion sequential OM1 andOM2 graft as well vein graft to RCA.LAD and diagonal system remain intact. RCA  collateralized   CORONARY ANGIOPLASTY WITH STENT PLACEMENT     CORONARY ARTERY BYPASS GRAFT  1994   EXPLORATORY LAPAROTOMY     ideopathic retroperitoneal fibrosis   LEFT HEART CATHETERIZATION WITH CORONARY/GRAFT  ANGIOGRAM N/A 02/04/2013   Procedure: LEFT HEART CATHETERIZATION WITH Beatrix Fetters;  Surgeon: [name], MD;  Location: [facility] CATH LAB;  Service: Cardiovascular;  Laterality: N/A;   LEFT PAROTID GLAND SURGRY  [id number]   Dr. Erik Obey   PERCUTANEOUS CORONARY STENT INTERVENTION (PCI-S)  06/25/2008   rotational atherectomy w/diffuse disease native circ. w/drug eluting stents , PTCA on one vessel; grat occlusion to RCA w/native RCA proxim. occlusion. Circ.supplied collaaterals distal RCA  following intervention   PERCUTANEOUS CORONARY STENT INTERVENTION (PCI-S) N/A 02/05/2013   Procedure: PERCUTANEOUS CORONARY STENT INTERVENTION (PCI-S);  Surgeon: [name], MD;  Location: [facility] CATH LAB;  Service: Cardiovascular;  Laterality: N/A;   PROSTATECTOMY  06/2009   robotic-asssisted laparoscopic radical by Dr Rosana Hoes    Prior to Admission medications   Medication Sig Start Date End Date Taking? Authorizing Provider  Ascorbic Acid (VITAMIN C PO) Take by mouth.   Yes [provider]  aspirin 81 MG tablet Take 81 mg by mouth daily.   Yes [provider]  cholecalciferol (VITAMIN D) 1000 UNITS tablet Take 1,000 Units by mouth daily.   Yes [provider]  Coenzyme Q10 (CO Q 10) 100 MG CAPS Take 300 mg by mouth every evening.   Yes [provider]  dorzolamide (TRUSOPT) 2 % ophthalmic solution SMARTSIG:In Eye(s)   Yes [provider]  ezetimibe (ZETIA) 10 MG tablet TAKE 1 TABLET BY MOUTH EVERY DAY 04/13/22  Yes Deberah Pelton, NP  isosorbide mononitrate (IMDUR) 60 MG 24 hr tablet Take 1 tablet (60 mg total) by mouth daily. 05/30/22  Yes [name], MD  metoprolol tartrate (LOPRESSOR) 25 MG tablet TAKE 1 TABLET BY MOUTH EVERY MORNING AND ONE HALF TABLETS EVERY EVENING.NEED APPT WITH FOR REFILLS 12/20/21  Yes [name], MD  omega-3 acid ethyl esters (LOVAZA) 1 g capsule TAKE 1 CAPSULE BY MOUTH TWICE A DAY 05/30/22  Yes [name], MD  ranolazine  (RANEXA) 500 MG 12 hr tablet TAKE 1 TABLET BY MOUTH TWICE A DAY 12/25/21  Yes [name], MD  rosuvastatin (CRESTOR) 40 MG tablet Take 1 tablet (40 mg total) by mouth daily. Please keep scheduled appointment 05/30/22  Yes [name], MD  valsartan (DIOVAN) 80 MG tablet TAKE 1 TABLET BY MOUTH EVERY DAY 05/30/22  Yes Cleaver, Jossie Ng, NP  vitamin B-12 (CYANOCOBALAMIN) 100 MCG tablet Take 100 mcg by mouth daily.    Yes [provider]  clopidogrel (PLAVIX) 75 MG tablet TAKE 1 TABLET BY MOUTH EVERY DAY 02/23/22   [name], MD  nitroGLYCERIN (NITROSTAT) 0.4 MG SL tablet Place 1 tablet (0.4 mg total) under the tongue every 5 (five) minutes as needed for chest pain. 10/22/16   [name], MD    Current Outpatient Medications  Medication Sig Dispense Refill   Ascorbic Acid (VITAMIN C PO) Take by mouth.     aspirin 81 MG tablet Take 81 mg by mouth daily.     cholecalciferol (VITAMIN D) 1000 UNITS tablet Take 1,000 Units by mouth daily.     Coenzyme Q10 (CO Q 10) 100 MG CAPS Take 300 mg by mouth every evening.     dorzolamide (TRUSOPT) 2 % ophthalmic solution SMARTSIG:In Eye(s)     ezetimibe (ZETIA) 10 MG tablet TAKE 1 TABLET BY MOUTH EVERY DAY 90 tablet 0   isosorbide mononitrate (IMDUR) 60 MG 24 hr tablet Take 1 tablet (60 mg total) by mouth daily. 90 tablet 1   metoprolol tartrate (LOPRESSOR) 25 MG tablet TAKE 1 TABLET BY MOUTH EVERY MORNING AND ONE HALF TABLETS EVERY EVENING.NEED APPT WITH FOR REFILLS 45 tablet 5   omega-3 acid ethyl esters (LOVAZA) 1 g capsule TAKE 1 CAPSULE BY MOUTH TWICE A DAY 60 capsule 2   ranolazine (RANEXA) 500 MG 12 hr tablet TAKE 1 TABLET BY MOUTH TWICE A DAY 180 tablet 3   rosuvastatin (CRESTOR) 40 MG tablet Take 1 tablet (40 mg total) by mouth daily. Please keep scheduled appointment 90 tablet 0   valsartan (DIOVAN) 80 MG tablet TAKE 1 TABLET BY MOUTH EVERY DAY 90 tablet 1   vitamin B-12 (CYANOCOBALAMIN) 100 MCG tablet Take 100 mcg by mouth  daily.      clopidogrel (PLAVIX) 75 MG tablet TAKE 1 TABLET BY MOUTH EVERY DAY 90 tablet 3   nitroGLYCERIN (NITROSTAT) 0.4 MG SL tablet Place 1 tablet (0.4 mg total) under the tongue every 5 (five) minutes as needed for chest pain. 75 tablet 3   Current Facility-Administered Medications  Medication Dose Route Frequency Provider Last Rate Last Admin   0.9 %  sodium chloride infusion  500 mL Intravenous Continuous Eivan Gallina, Lajuan Lines, MD        Allergies as of 07/16/2022   (No Known Allergies)    Family History  Problem Relation Age of Onset   Pancreatic cancer Father    Heart disease Father    Hypertension Father  Stroke Mother    Hypertension Mother    Coronary artery disease Brother 32       2 bros with early CAD   Hypertension Brother    Coronary artery disease Brother    Hypertension Brother    Coronary artery disease Brother    Hypertension Brother    Heart attack Maternal Grandfather    Hypertension Paternal Grandfather    Lupus Daughter    Hypertension Son    Hypertension Sister    Hypertension Sister    Throat cancer Brother    Colon cancer Neg Hx    Esophageal cancer Neg Hx    Stomach cancer Neg Hx    Rectal cancer Neg Hx     Social History   Socioeconomic History   Marital status: Married    Spouse name: Not on file   Number of children: 2   Years of education: Not on file   Highest education level: Not on file  Occupational History   Occupation: Theme park manager -retired     Comment: [place]   Tobacco Use   Smoking status: Former    Types: Cigarettes    Quit date: 05/07/1973    Years since quitting: 49.2    Passive exposure: Never   Smokeless tobacco: Never  Vaping Use   Vaping Use: Never used  Substance and Sexual Activity   Alcohol use: No   Drug use: No   Sexual activity: Not Currently  Other Topics Concern   Not on file  Social History Narrative   ** Merged History Encounter **       Married 43 years in 2016. 2 children. 5  grand kids.   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   Hobbies: speaking events, traveling   Social Determinants of Health   Financial Resource Strain: Low Risk  (04/05/2022)   Overall Financial Resource Strain (CARDIA)    Difficulty of Paying Living Expenses: Not hard at all  Food Insecurity: No Food Insecurity (04/05/2022)   Hunger Vital Sign    Worried About Running Out of Food in the Last Year: Never true    Ran Out of Food in the Last Year: Never true  Transportation Needs: No Transportation Needs (04/05/2022)   PRAPARE - Hydrologist (Medical): No    Lack of Transportation (Non-Medical): No  Physical Activity: Sufficiently Active (04/05/2022)   Exercise Vital Sign    Days of Exercise per Week: 2 days    Minutes of Exercise per Session: 120 min  Stress: No Stress Concern Present (04/05/2022)   [place]    Feeling of Stress : Not at all  Social Connections: [place] (04/05/2022)   Social Connection and Isolation Panel [NHANES]    Frequency of Communication with Friends and Family: More than three times a week    Frequency of Social Gatherings with Friends and Family: More than three times a week    Attends Religious Services: More than 4 times per year    Active Member of [employer] or Organizations: Yes    Attends Archivist Meetings: 1 to 4 times per year    Marital Status: Married  Human resources officer Violence: Not At Risk (04/05/2022)   Humiliation, Afraid, Rape, and Kick questionnaire    Fear of Current or Ex-Partner: No    Emotionally Abused: No    Physically Abused: No    Sexually Abused: No  Physical Exam: Vital signs in last 24 hours: '@BP'$  (!) 155/82   Pulse 63   Temp 98.7 F (37.1 C)   Ht '5\' 8"'$  (1.727 m)   Wt 217 lb (98.4 kg)   SpO2 98%   BMI 32.99 kg/m  GEN: NAD EYE: Sclerae anicteric ENT: MMM CV: Non-tachycardic Pulm: CTA b/l GI: Soft,  NT/ND NEURO:  Alert & Oriented x 3   Zenovia Jarred, MD [facility]  07/16/2022 2:55 PM

## 2022-07-16 NOTE — Op Note (Addendum)
[name]: Joshua Hahn Procedure Date: 07/16/2022 3:06 PM MRN: [id number] Endoscopist: Jerene Bears , MD, [id number] Age: 77 Referring MD:  Date of Birth: 1945/12/03 Gender: Male Account #: [id number] Procedure:                Colonoscopy Indications:              High risk colon cancer surveillance: Personal                            history of non-advanced adenomas, Last colonoscopy:                            July 2016 (2 TAs) Medicines:                Monitored Anesthesia Care Procedure:                Pre-Anesthesia Assessment:                           - Prior to the procedure, a History and Physical                            was performed, and patient medications and                            allergies were reviewed. The patient's tolerance of                            previous anesthesia was also reviewed. The risks                            and benefits of the procedure and the sedation                            options and risks were discussed with the patient.                            All questions were answered, and informed consent                            was obtained. Prior Anticoagulants: The patient has                            taken Plavix (clopidogrel), last dose was 5 days                            prior to procedure. ASA Grade Assessment: III - A                            patient with severe systemic disease. After                            reviewing the risks and benefits, the patient was  deemed in satisfactory condition to undergo the                            procedure.                           After obtaining informed consent, the colonoscope                            was passed under direct vision. Throughout the                            procedure, the patient's blood pressure, pulse, and                            oxygen saturations were monitored continuously. The                             Olympus CF-HQ190L [id number]) Colonoscope was                            introduced through the anus and advanced to the                            cecum, identified by appendiceal orifice and                            ileocecal valve. The colonoscopy was somewhat                            difficult due to multiple diverticula in the colon                            and significant looping. Successful completion of                            the procedure was aided by applying abdominal                            pressure. The patient tolerated the procedure well. Scope In: 3:13:07 PM Scope Out: 3:39:08 PM Scope Withdrawal Time: 0 hours 22 minutes 26 seconds  Total Procedure Duration: 0 hours 26 minutes 1 second  Findings:                 The digital rectal exam was normal.                           Two sessile polyps were found in the ascending                            colon. The polyps were 4 to 7 mm in size. These                            polyps were removed with a cold snare. Resection  and retrieval were complete.                           A 5 mm polyp was found in the descending colon. The                            polyp was sessile. The polyp was removed with a                            cold snare. Resection and retrieval were complete.                           Multiple large-mouthed, medium-mouthed and                            small-mouthed diverticula were found in the sigmoid                            colon and descending colon.                           Internal hemorrhoids were found during                            retroflexion. The hemorrhoids were small. Complications:            No immediate complications. Estimated Blood Loss:     Estimated blood loss was minimal. Impression:               - Two 4 to 7 mm polyps in the ascending colon,                            removed with a cold snare. Resected and retrieved.                            - One 5 mm polyp in the descending colon, removed                            with a cold snare. Resected and retrieved.                           - Severe diverticulosis in the sigmoid colon and in                            the descending colon.                           - Internal hemorrhoids. Recommendation:           - Patient has a contact number available for                            emergencies. The signs and symptoms of potential  delayed complications were discussed with the                            patient. Return to normal activities tomorrow.                            Written discharge instructions were provided to the                            patient.                           - Resume previous diet.                           - Continue present medications.                           - Resume Plavix (clopidogrel) at prior dose                            tomorrow. Refer to managing physician for further                            adjustment of therapy.                           - Await pathology results.                           - No recommendation at this time regarding repeat                            colonoscopy. Jerene Bears, MD 07/16/2022 3:42:19 PM This report has been signed electronically.

## 2022-07-16 NOTE — Progress Notes (Signed)
Sedate, gd SR's, VSS, report to RN 

## 2022-07-16 NOTE — Patient Instructions (Addendum)
- Resume previous diet.                           - Continue present medications.                           - Resume Plavix (clopidogrel) at prior dose                            tomorrow. Refer to managing physician for further                            adjustment of therapy.                           - Await pathology results.                           - No recommendation at this time regarding repeat                            colonoscopy.   3 polyps removed and sent to pathology.  Await results for final recommendations.  Handouts on findings given to patient.  (Polyps, diverticulosis, hemorrhoids)   YOU HAD AN ENDOSCOPIC PROCEDURE TODAY AT [facility]:   Refer to the procedure report that was given to you for any specific questions about what was found during the examination.  If the procedure report does not answer your questions, please call your gastroenterologist to clarify.  If you requested that your care partner not be given the details of your procedure findings, then the procedure report has been included in a sealed envelope for you to review at your convenience later.  YOU SHOULD EXPECT: Some feelings of bloating in the abdomen. Passage of more gas than usual.  Walking can help get rid of the air that was put into your GI tract during the procedure and reduce the bloating. If you had a lower endoscopy (such as a colonoscopy or flexible sigmoidoscopy) you may notice spotting of blood in your stool or on the toilet paper. If you underwent a bowel prep for your procedure, you may not have a normal bowel movement for a few days.  Please Note:  You might notice some irritation and congestion in your nose or some drainage.  This is from the oxygen used during your procedure.  There is no need for concern and it should clear up in a day or so.  SYMPTOMS TO REPORT IMMEDIATELY:  Following lower endoscopy (colonoscopy or flexible  sigmoidoscopy):  Excessive amounts of blood in the stool  Significant tenderness or worsening of abdominal pains  Swelling of the abdomen that is new, acute  Fever of 100F or higher   For urgent or emergent issues, a gastroenterologist can be reached at any hour by calling [phone]. Do not use MyChart messaging for urgent concerns.    DIET:  We do recommend a small meal at first, but then you may proceed to your regular diet.  Drink plenty of fluids but you should avoid alcoholic beverages for 24 hours.  ACTIVITY:  You should plan to take it easy for the rest of today and you should NOT DRIVE or use heavy  machinery until tomorrow (because of the sedation medicines used during the test).    FOLLOW UP: Our staff will call the number listed on your records the next business day following your procedure.  We will call around 7:15- 8:00 am to check on you and address any questions or concerns that you may have regarding the information given to you following your procedure. If we do not reach you, we will leave a message.     If any biopsies were taken you will be contacted by phone or by letter within the next 1-3 weeks.  Please call us at [phone] if you have not heard about the biopsies in 3 weeks.    SIGNATURES/CONFIDENTIALITY: You and/or your care partner have signed paperwork which will be entered into your electronic medical record.  These signatures attest to the fact that that the information above on your After Visit Summary has been reviewed and is understood.  Full responsibility of the confidentiality of this discharge information lies with you and/or your care-partner.

## 2022-07-17 ENCOUNTER — Telehealth: Payer: Self-pay

## 2022-07-17 NOTE — Telephone Encounter (Signed)
  Follow up Call-     07/16/2022    2:13 PM  Call back number  Post procedure Call Back phone  # [phone]  Permission to leave phone message Yes     Patient questions:  Do you have a fever, pain , or abdominal swelling? No. Pain Score  0 *  Have you tolerated food without any problems? Yes.    Have you been able to return to your normal activities? Yes.    Do you have any questions about your discharge instructions: Diet   No. Medications  No. Follow up visit  No.  Do you have questions or concerns about your Care? No.  Actions: * If pain score is 4 or above: No action needed, pain <4.

## 2022-07-23 ENCOUNTER — Encounter: Payer: Self-pay | Admitting: Internal Medicine

## 2022-08-16 DIAGNOSIS — E785 Hyperlipidemia, unspecified: Secondary | ICD-10-CM | POA: Diagnosis not present

## 2022-08-16 DIAGNOSIS — Z01818 Encounter for other preprocedural examination: Secondary | ICD-10-CM | POA: Diagnosis not present

## 2022-08-17 LAB — COMPREHENSIVE METABOLIC PANEL
ALT: 27 IU/L (ref 0–44)
AST: 40 IU/L (ref 0–40)
Albumin/Globulin Ratio: 1.9 (ref 1.2–2.2)
Albumin: 4.3 g/dL (ref 3.8–4.8)
Alkaline Phosphatase: 71 IU/L (ref 44–121)
BUN/Creatinine Ratio: 10 (ref 10–24)
BUN: 19 mg/dL (ref 8–27)
Bilirubin Total: 0.8 mg/dL (ref 0.0–1.2)
CO2: 23 mmol/L (ref 20–29)
Calcium: 9.9 mg/dL (ref 8.6–10.2)
Chloride: 104 mmol/L (ref 96–106)
Creatinine, Ser: 1.98 mg/dL — ABNORMAL HIGH (ref 0.76–1.27)
Globulin, Total: 2.3 g/dL (ref 1.5–4.5)
Glucose: 98 mg/dL (ref 70–99)
Potassium: 5.1 mmol/L (ref 3.5–5.2)
Sodium: 140 mmol/L (ref 134–144)
Total Protein: 6.6 g/dL (ref 6.0–8.5)
eGFR: 34 mL/min/{1.73_m2} — ABNORMAL LOW (ref >59)

## 2022-08-17 LAB — LIPID PANEL
Chol/HDL Ratio: 3 ratio (ref 0.0–5.0)
Cholesterol, Total: 117 mg/dL (ref 100–199)
HDL: 39 mg/dL — ABNORMAL LOW (ref >39)
LDL Chol Calc (NIH): 56 mg/dL (ref 0–99)
Triglycerides: 123 mg/dL (ref 0–149)
VLDL Cholesterol Cal: 22 mg/dL (ref 5–40)

## 2022-08-17 LAB — CBC
Hematocrit: 43.1 % (ref 37.5–51.0)
Hemoglobin: 14 g/dL (ref 13.0–17.7)
MCH: 29.7 pg (ref 26.6–33.0)
MCHC: 32.5 g/dL (ref 31.5–35.7)
MCV: 92 fL (ref 79–97)
Platelets: 198 10*3/uL (ref 150–450)
RBC: 4.71 x10E6/uL (ref 4.14–5.80)
RDW: 12.4 % (ref 11.6–15.4)
WBC: 6.4 10*3/uL (ref 3.4–10.8)

## 2022-08-20 ENCOUNTER — Encounter: Payer: Self-pay | Admitting: *Deleted

## 2022-08-29 ENCOUNTER — Other Ambulatory Visit: Payer: Self-pay | Admitting: General Practice

## 2022-09-21 ENCOUNTER — Encounter: Payer: Self-pay | Admitting: Cardiovascular Disease

## 2022-09-21 ENCOUNTER — Ambulatory Visit: Payer: Medicare Other | Attending: Cardiovascular Disease | Admitting: Cardiovascular Disease

## 2022-09-21 DIAGNOSIS — Z951 Presence of aortocoronary bypass graft: Secondary | ICD-10-CM | POA: Diagnosis not present

## 2022-09-21 DIAGNOSIS — I440 Atrioventricular block, first degree: Secondary | ICD-10-CM | POA: Insufficient documentation

## 2022-09-21 DIAGNOSIS — M25473 Effusion, unspecified ankle: Secondary | ICD-10-CM | POA: Diagnosis not present

## 2022-09-21 DIAGNOSIS — E782 Mixed hyperlipidemia: Secondary | ICD-10-CM | POA: Diagnosis not present

## 2022-09-21 DIAGNOSIS — I10 Essential (primary) hypertension: Secondary | ICD-10-CM | POA: Diagnosis not present

## 2022-09-21 DIAGNOSIS — G4733 Obstructive sleep apnea (adult) (pediatric): Secondary | ICD-10-CM

## 2022-09-21 DIAGNOSIS — I2510 Atherosclerotic heart disease of native coronary artery without angina pectoris: Secondary | ICD-10-CM | POA: Diagnosis not present

## 2022-09-21 MED ORDER — HYDROCHLOROTHIAZIDE 25 MG PO TABS: 12.5000 mg | ORAL_TABLET | ORAL | 3 refills | Status: DC | PRN

## 2022-09-21 NOTE — Progress Notes (Unsigned)
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 improved but today blood pressure continues to be elevated on a regimen consisting of metoprolol tartrate 25 mg in the morning and 12.5 mg at night, isosorbide 60 mg daily, and valsartan 80 mg.  He has a history of renal insufficiency and when he was evaluated in the emergency room and tested positive for COVID on Sep 29, 2020 his creatinine had increased to 1.96.  He has not had recent laboratory.  With his blood pressure elevation today, I am starting amlodipine 5 mg.  Tomorrow he will come back to the office for fasting laboratory with a comprehensive metabolic panel, CBC, TSH and lipid studies.  If renal function has further deteriorated we may need to discontinue valsartan.  He continues to be on DAPT and also is on ranolazine 500 mg twice a day and not having anginal symptoms.  I reviewed his CPAP download and I had a auto pressure setting between 13 and 18, he is meeting compliance and AHI was 5.8 and contributed by significant mask leak.  I suspect therefore that his AHI without leak is even  better.  He has noted significant improvement in energy, his sleep is more restorative, his nocturia has reduced, and he denies any daytime sleepiness since CPAP initiation.  I have provided him with a sample of a new ResMed AirFit F 30i mask which I think will be superior to his current mask which she had received in the sleep lab.  He will make a decision of whether or not he will go to [place] over the next week.  He will monitor his pressure.  He continues to be on rosuvastatin 40 mg and Zetia 10 mg for hyperlipidemia.  LDL cholesterol in December   remains slightly elevated at 85.  Target LDL is less than 70.  If he cannot reach  target, he may be a candidate for Repatha. I will contact him regarding his laboratory.  I have recommended follow-up evaluation in approximately 2 to 3 months or sooner depending upon symptomatology.   Lennette Bihari, MD, [facility]  09/21/2022 6:18 PM

## 2022-09-21 NOTE — Patient Instructions (Signed)
Medication Instructions:  START HCTZ 12.5 mg as needed for swelling.  *If you need a refill on your cardiac medications before your next appointment, please call your pharmacy*   Follow-Up: At [facility], you and your health needs are our priority.  As part of our continuing mission to provide you with exceptional heart care, we have created designated Provider Care Teams.  These Care Teams include your primary Cardiologist (physician) and Advanced Practice Providers (APPs -  Physician Assistants and Nurse Practitioners) who all work together to provide you with the care you need, when you need it.  We recommend signing up for the patient portal called "MyChart".  Sign up information is provided on this After Visit Summary.  MyChart is used to connect with patients for Virtual Visits (Telemedicine).  Patients are able to view lab/test results, encounter notes, upcoming appointments, etc.  Non-urgent messages can be sent to your provider as well.   To learn more about what you can do with MyChart, go to [email or web address].    Your next appointment:   6 month(s)  Provider:   Nicki Guadalajara, MD

## 2022-09-23 ENCOUNTER — Encounter: Payer: Self-pay | Admitting: Cardiovascular Disease

## 2022-09-24 IMAGING — DX DG KNEE COMPLETE 4+V*L*
4 series · 4 of 4 positions shown · non-contrast
Comparison: None.

CLINICAL DATA: 74-year-old male with sudden onset pain and swelling
x4 days. No recent injury.

EXAM:
LEFT KNEE - COMPLETE 4+ VIEW

[knee ap]
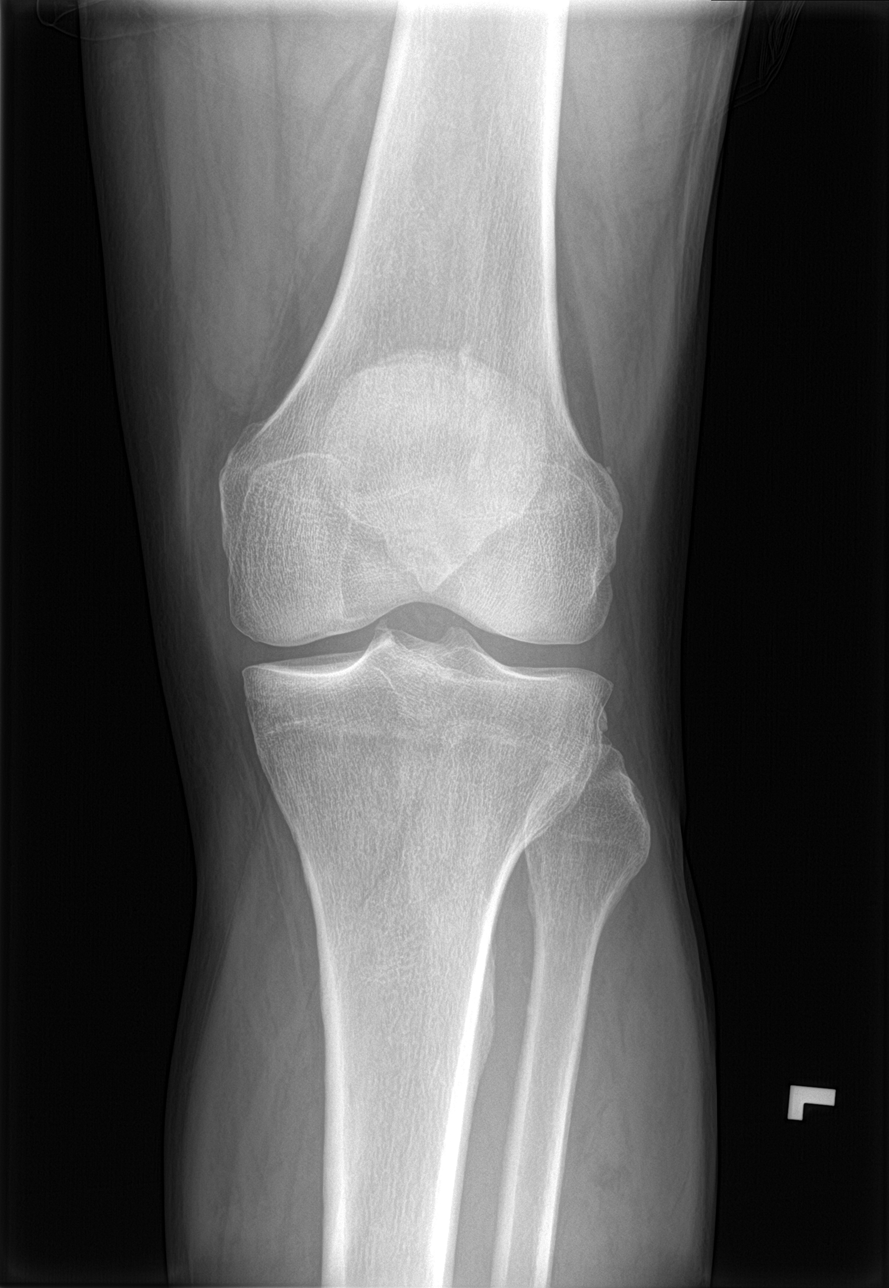

[knee lat]
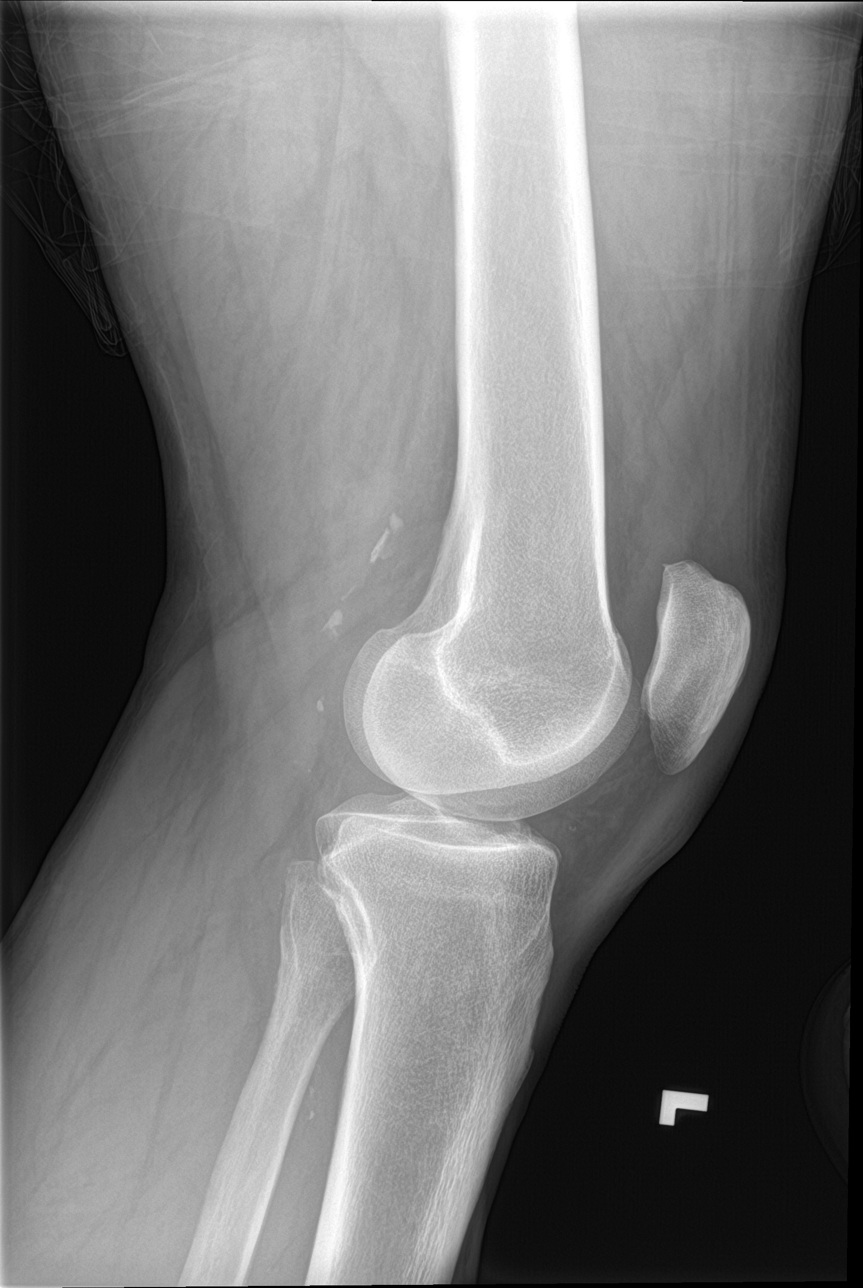

[knee obl (1 of 2)]
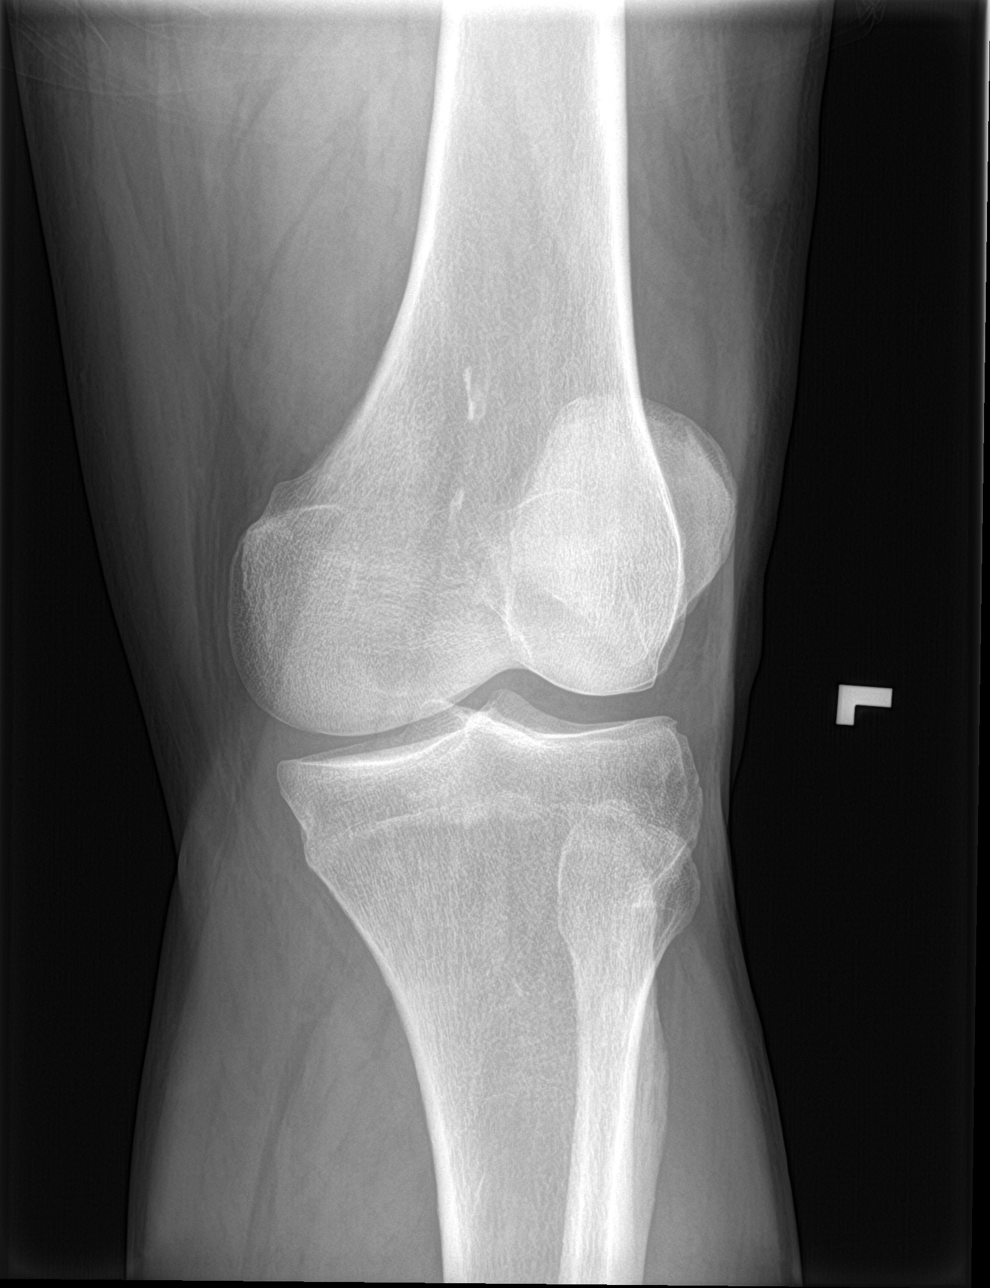

[knee obl (2 of 2)]
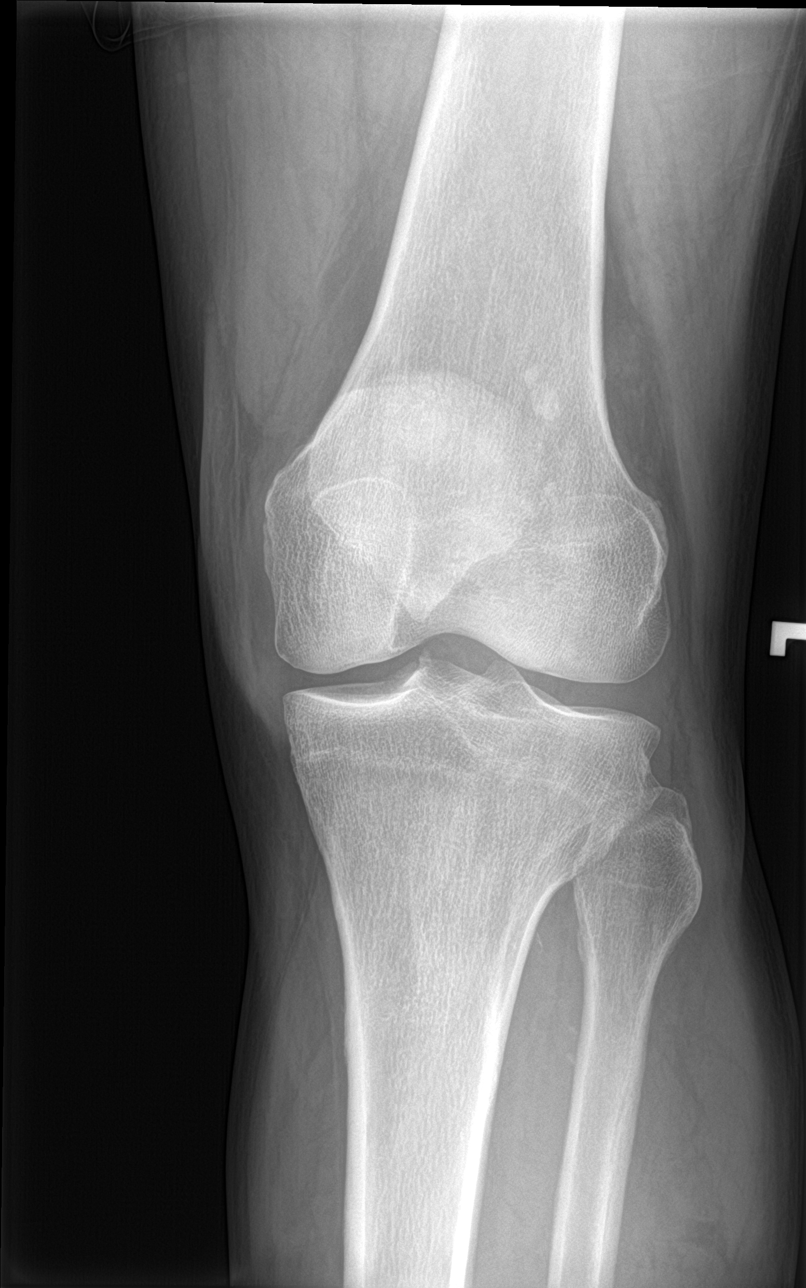

[4 of 4 positions shown; findings below may reference images not displayed]

FINDINGS: Bone mineralization is within normal limits. Joint spaces and
alignment are preserved. Small suprapatellar joint effusion is
evident. The patella appears intact. No acute osseous abnormality
identified. Superimposed calcified peripheral vascular disease most
apparent in the popliteal fossa.
IMPRESSION: 1. Small joint effusion. No acute osseous abnormality identified.
2. Calcified peripheral vascular disease.

## 2022-09-27 ENCOUNTER — Other Ambulatory Visit: Payer: Self-pay

## 2022-09-27 MED ORDER — OMEGA-3-ACID ETHYL ESTERS 1 G PO CAPS: 1.0000 | ORAL_CAPSULE | Freq: Two times a day (BID) | ORAL | 3 refills | Status: DC

## 2022-10-08 ENCOUNTER — Other Ambulatory Visit: Payer: Self-pay | Admitting: Cardiovascular Disease

## 2022-10-12 ENCOUNTER — Telehealth: Payer: Self-pay | Admitting: Cardiovascular Disease

## 2022-10-12 NOTE — Telephone Encounter (Signed)
Pt c/o medication issue:  1. Name of Medication:   amLODipine (NORVASC) 5 MG tablet [[id number]]   2. How are you currently taking this medication (dosage and times per day)?   As prescribed  3. Are you having a reaction (difficulty breathing--STAT)?   No  4. What is your medication issue?   Patient wants to confirm he should still be taking this medication.

## 2022-10-12 NOTE — Telephone Encounter (Signed)
Patient stated he is currently taking amlodipine 5 mg, he called the pharmacy to refill the medication he was advised the medication is d/c'd. Pt stated he has been taking this medication for years and he was not told to stop taking amlodipine. Pt had virtual visit on 2/5, the mediation was d/c'd .  Will forward to MD and nurse for advise.

## 2022-10-15 ENCOUNTER — Other Ambulatory Visit: Payer: Self-pay | Admitting: Cardiovascular Disease

## 2022-10-15 ENCOUNTER — Other Ambulatory Visit: Payer: Self-pay | Admitting: General Practice

## 2022-10-15 MED ORDER — AMLODIPINE BESYLATE 5 MG PO TABS: 5.0000 mg | ORAL_TABLET | Freq: Every day | ORAL | 1 refills | Status: DC

## 2022-10-15 NOTE — Telephone Encounter (Signed)
Called pt to let him know I see where the medication 144/78, 158/84, 161/89. Pt has been out of the medication for three days. His blood pressure has been increasing. Pt reports weakness. He wants to know can he be prescribed the medication until he gets a definistive answer.  Spoke with DOD (Dr. Jacques Navy) after looking over pt's chart and given information about it his current symptoms. She authorized giving refills until he is seen by an APP. Pt is see APP before 11/4 appt with Dr. Tresa Endo. Low risk and no ideology as to why Norvasc was discontinued.   Pt added to Emily's schedule. Prescription sent to pharmacy.

## 2022-10-15 NOTE — Telephone Encounter (Signed)
Patient is following up. He would like to know if someone is able to advise him until Dr. Tresa Endo returns to the office. Please advise.

## 2022-10-16 NOTE — Telephone Encounter (Signed)
I started the patient on amlodipine at his office visit with me in May 2024 which time his blood pressure was elevated.  If he is out of this medication, this needs to be renewed.

## 2022-10-18 ENCOUNTER — Ambulatory Visit: Payer: Medicare Other | Attending: Nurse Practitioner | Admitting: Nurse Practitioner

## 2022-10-18 ENCOUNTER — Encounter: Payer: Self-pay | Admitting: Nurse Practitioner

## 2022-10-18 ENCOUNTER — Other Ambulatory Visit: Payer: Self-pay | Admitting: *Deleted

## 2022-10-18 VITALS — BP 138/72 | HR 64 | Ht 71.0 in | Wt 211.8 lb

## 2022-10-18 DIAGNOSIS — E785 Hyperlipidemia, unspecified: Secondary | ICD-10-CM

## 2022-10-18 DIAGNOSIS — N1832 Chronic kidney disease, stage 3b: Secondary | ICD-10-CM | POA: Diagnosis not present

## 2022-10-18 DIAGNOSIS — I2510 Atherosclerotic heart disease of native coronary artery without angina pectoris: Secondary | ICD-10-CM

## 2022-10-18 DIAGNOSIS — G4733 Obstructive sleep apnea (adult) (pediatric): Secondary | ICD-10-CM | POA: Insufficient documentation

## 2022-10-18 DIAGNOSIS — I25810 Atherosclerosis of coronary artery bypass graft(s) without angina pectoris: Secondary | ICD-10-CM

## 2022-10-18 DIAGNOSIS — I10 Essential (primary) hypertension: Secondary | ICD-10-CM

## 2022-10-18 MED ORDER — METOPROLOL TARTRATE 25 MG PO TABS: ORAL_TABLET | ORAL | 3 refills | Status: DC

## 2022-10-18 MED ORDER — ROSUVASTATIN CALCIUM 40 MG PO TABS: 40.0000 mg | ORAL_TABLET | Freq: Every day | ORAL | 3 refills | Status: DC

## 2022-10-18 MED ORDER — AMLODIPINE BESYLATE 5 MG PO TABS: 5.0000 mg | ORAL_TABLET | Freq: Every day | ORAL | 3 refills | Status: DC

## 2022-10-18 MED ORDER — NITROGLYCERIN 0.4 MG SL SUBL
0.4000 mg | SUBLINGUAL_TABLET | SUBLINGUAL | 3 refills | Status: DC | PRN
Start: 2022-10-18 — End: 2023-09-21

## 2022-10-18 MED ORDER — VALSARTAN 80 MG PO TABS: 80.0000 mg | ORAL_TABLET | Freq: Every day | ORAL | 3 refills | Status: DC

## 2022-10-18 NOTE — Patient Instructions (Addendum)
Medication Instructions:  Your physician recommends that you continue on your current medications as directed. Please refer to the Current Medication list given to you today.  *If you need a refill on your cardiac medications before your next appointment, please call your pharmacy*   Lab Work: NONE If you have labs (blood work) drawn today and your tests are completely normal, you will receive your results only by: MyChart Message (if you have MyChart) OR A paper copy in the mail If you have any lab test that is abnormal or we need to change your treatment, we will call you to review the results.   Testing/Procedures: NONE   Follow-Up: At [facility], you and your health needs are our priority.  As part of our continuing mission to provide you with exceptional heart care, we have created designated Provider Care Teams.  These Care Teams include your primary Cardiologist (physician) and Advanced Practice Providers (APPs -  Physician Assistants and Nurse Practitioners) who all work together to provide you with the care you need, when you need it.  Your next appointment:   KEEP SCHEDULED APPOINTMENT WITH DR. Tresa Endo IN NOVEMBER  Provider:   Nicki Guadalajara, MD

## 2022-10-18 NOTE — Progress Notes (Signed)
Office Visit    Patient Name: Joshua Hahn Date of Encounter: 10/18/2022  Primary Care Provider:  Shelva Majestic, MD Primary Cardiologist:  Nicki Guadalajara, MD  Chief Complaint    77 year old male with a history of CAD s/p remote PCI to distal RCA in 1988, CABG x 5 (LIMA-D1, SVG-LAD, SVG-OM1, SVG-OM2, SVG-RCA) in 1994, DES x 2-LCx in 2010, PTCA-LCx, DES-SVG-LAD in 2014, hypertension, hyperlipidemia, OSA on CPAP, CKD stage III, and prostate cancer who presents for follow-up related to CAD and hypertension.  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 levels LDL IIIa+b% and HDL2b%   Hypertension    Myocardial infarct [facility])    Myocardial infarction (HCC) 1994   inferior wall [facility]   Prostate cancer [facility])    Sleep apnea    uses CPAP PRN   Sleep apnea, obstructive    using C-PAP   Past Surgical History:  Procedure Laterality Date   ABDOMINAL SURGERY     CARDIAC CATHETERIZATION  06/23/2008   occlusion sequential OM1 andOM2 graft as well vein graft to RCA.LAD and diagonal system remain intact. RCA  collateralized   CORONARY ANGIOPLASTY WITH STENT PLACEMENT     CORONARY ARTERY BYPASS GRAFT  1994   EXPLORATORY LAPAROTOMY     ideopathic retroperitoneal fibrosis   LEFT HEART CATHETERIZATION WITH CORONARY/GRAFT ANGIOGRAM N/A 02/04/2013   Procedure: LEFT HEART CATHETERIZATION WITH Isabel Caprice;  Surgeon: Lennette Bihari, MD;  Location: [facility] CATH LAB;  Service: Cardiovascular;  Laterality: N/A;   LEFT PAROTID GLAND SURGRY  05/2009   Dr. Lazarus Salines   PERCUTANEOUS CORONARY STENT  INTERVENTION (PCI-S)  06/25/2008   rotational atherectomy w/diffuse disease native circ. w/drug eluting stents , PTCA on one vessel; grat occlusion to RCA w/native RCA proxim. occlusion. Circ.supplied collaaterals distal RCA  following intervention   PERCUTANEOUS CORONARY STENT INTERVENTION (PCI-S) N/A 02/05/2013   Procedure: PERCUTANEOUS CORONARY STENT INTERVENTION (PCI-S);  Surgeon: Lennette Bihari, MD;  Location: [facility] CATH LAB;  Service: Cardiovascular;  Laterality: N/A;   PROSTATECTOMY  06/2009   robotic-asssisted laparoscopic radical by Dr Earlene Plater    Allergies  No Known Allergies   Labs/Other Studies Reviewed    The following studies were reviewed today:  Cardiac Studies & Procedures     STRESS TESTS  MYOCARDIAL PERFUSION IMAGING 05/03/2020  Narrative  The left ventricular ejection fraction is mildly decreased (45-54%).  Nuclear stress EF: 46%.  There was no ST segment deviation noted during stress.  There is a medium defect of moderate severity present in the basal inferior, basal inferolateral, mid inferior and mid inferolateral location. The defect is partially reversible in the inferior wall and fully reversible in the inferolateral wall consistent with prior infarct and peri infarct in the basal and mid inferior walls and new ischemia in the inferolateral walls.  This is an intermediate risk study.             Recent Labs: 08/16/2022: ALT 27; BUN 19; Creatinine, Ser 1.98; Hemoglobin 14.0; Platelets 198; Potassium 5.1; Sodium 140  Recent Lipid Panel    Component  Value Date/Time   CHOL 117 08/16/2022 1356   CHOL 137 11/22/2014 1117   TRIG 123 08/16/2022 1356   TRIG 160 (H) 11/22/2014 1117   TRIG 216 (HH) 03/04/2006 0946   HDL 39 (L) 08/16/2022 1356   HDL 43 11/22/2014 1117   CHOLHDL 3.0 08/16/2022 1356   CHOLHDL 3.4 08/17/2015 1158   VLDL 32 (H) 08/17/2015 1158   LDLCALC 56 08/16/2022 1356   LDLCALC 62 11/22/2014 1117   LDLDIRECT 66.0 01/09/2018 1229    History  of Present Illness    77 year old male with the above past medical history including CAD s/p remote PCI to distal RCA in 1988, CABG x 5 (LIMA-D1, SVG-LAD, SVG-OM1, SVG-OM2, SVG-RCA) in 1994, DES x 2-LCx in 2010, PTCA-LCx, DES-SVG-LAD in 2014, hypertension, hyperlipidemia, OSA on CPAP, CKD stage III, and prostate cancer.  He has a longstanding history of CAD dating back to 97 s/p PCI-distal RCA.  He was hospitalized in 1994 in the setting of inferior wall MI and subsequently underwent CABG with RIMA to large diagonal 1, SVG to LAD, SVG to OM1 and OM 2, SVG to RCA.  Cardiac catheterization 2010 showed significant native CAD with total occlusion of LAD, diffuse AV groove left circumflex stenosis, occluded SVG to RCA, patent LIMA to diagonal, patent SVG to LAD, occluded SVG to OM.  He underwent complex intervention to native left circumflex with placement of DES x 2.  Repeat cardiac catheterization in October 2014 in the setting of chest pain revealed 95% focal ISR of the distal third of the stent in the left circumflex, high-grade lesion in the SVG to the diagonal s/p PTCA-LCx, DES-SVG-LAD.  Most recent ischemic evaluation (Myoview in 2021) was low risk.  He was last seen in the office on 09/21/2022 and was stable from a cardiac standpoint.  He denied symptoms concerning for angina.  BP was somewhat elevated.  He was started on amlodipine 5 mg daily.  He contacted our office on 10/12/2022 stating he had run out of his amlodipine, BP was elevated.  His prescription was refilled as he was advised to continue taking the amlodipine.  He presents today for follow-up.  Since his last visit he has done well from a cardiac standpoint.  His BP has been well-controlled now that he is back on his amlodipine.  He appears to be tolerating this medication well.  He remains active.  He denies any symptoms concerning for angina.  Overall, he reports feeling well.  Home Medications    Current Outpatient Medications   Medication Sig Dispense Refill   Ascorbic Acid (VITAMIN C PO) Take by mouth.     aspirin 81 MG tablet Take 81 mg by mouth daily.     cholecalciferol (VITAMIN D) 1000 UNITS tablet Take 1,000 Units by mouth daily.     clopidogrel (PLAVIX) 75 MG tablet TAKE 1 TABLET BY MOUTH EVERY DAY 90 tablet 3   Coenzyme Q10 (CO Q 10) 100 MG CAPS Take 300 mg by mouth every evening.     dorzolamide (TRUSOPT) 2 % ophthalmic solution SMARTSIG:In Eye(s)     ezetimibe (ZETIA) 10 MG tablet TAKE 1 TABLET BY MOUTH EVERY DAY 90 tablet 0   hydrochlorothiazide (HYDRODIURIL) 25 MG tablet Take 0.5 tablets (12.5 mg total) by mouth as needed (swelling). 90 tablet 3   isosorbide mononitrate (IMDUR) 60 MG 24 hr tablet Take 1 tablet (60 mg total) by mouth daily. 90 tablet 1   omega-3 acid ethyl esters (LOVAZA) 1 g capsule Take 1  capsule (1 g total) by mouth 2 (two) times daily. 180 capsule 3   ranolazine (RANEXA) 500 MG 12 hr tablet TAKE 1 TABLET BY MOUTH TWICE A DAY 180 tablet 3   vitamin B-12 (CYANOCOBALAMIN) 100 MCG tablet Take 100 mcg by mouth daily.      amLODipine (NORVASC) 5 MG tablet Take 1 tablet (5 mg total) by mouth daily. 90 tablet 3   metoprolol tartrate (LOPRESSOR) 25 MG tablet TAKE ONE TABLET IN THE MORNING AND HALF TABLET IN THE EVENING. 135 tablet 3   nitroGLYCERIN (NITROSTAT) 0.4 MG SL tablet Place 1 tablet (0.4 mg total) under the tongue every 5 (five) minutes as needed for chest pain. 75 tablet 3   rosuvastatin (CRESTOR) 40 MG tablet Take 1 tablet (40 mg total) by mouth daily. Please keep scheduled appointment 90 tablet 3   valsartan (DIOVAN) 80 MG tablet Take 1 tablet (80 mg total) by mouth daily. 90 tablet 3   No current facility-administered medications for this visit.     Review of Systems    He denies chest pain, palpitations, dyspnea, pnd, orthopnea, n, v, dizziness, syncope, edema, weight gain, or early satiety. All other systems reviewed and are otherwise negative except as noted above.    Physical Exam    VS:  BP 138/72   Pulse 64   Ht 5\' 11"  (1.803 m)   Wt 211 lb 12.8 oz (96.1 kg)   SpO2 96%   BMI 29.54 kg/m  GEN: Well nourished, well developed, in no acute distress. HEENT: normal. Neck: Supple, no JVD, carotid bruits, or masses. Cardiac: RRR, no murmurs, rubs, or gallops. No clubbing, cyanosis, edema.  Radials/DP/PT 2+ and equal bilaterally.  Respiratory:  Respirations regular and unlabored, clear to auscultation bilaterally. GI: Soft, nontender, nondistended, BS + x 4. MS: no deformity or atrophy. Skin: warm and dry, no rash. Neuro:  Strength and sensation are intact. Psych: Normal affect.  Accessory Clinical Findings    ECG personally reviewed by me today - No EKG in office today.   Lab Results  Component Value Date   WBC 6.4 08/16/2022   HGB 14.0 08/16/2022   HCT 43.1 08/16/2022   MCV 92 08/16/2022   PLT 198 08/16/2022   Lab Results  Component Value Date   CREATININE 1.98 (H) 08/16/2022   BUN 19 08/16/2022   NA 140 08/16/2022   K 5.1 08/16/2022   CL 104 08/16/2022   CO2 23 08/16/2022   Lab Results  Component Value Date   ALT 27 08/16/2022   AST 40 08/16/2022   ALKPHOS 71 08/16/2022   BILITOT 0.8 08/16/2022   Lab Results  Component Value Date   CHOL 117 08/16/2022   HDL 39 (L) 08/16/2022   LDLCALC 56 08/16/2022   LDLDIRECT 66.0 01/09/2018   TRIG 123 08/16/2022   CHOLHDL 3.0 08/16/2022    Lab Results  Component Value Date   HGBA1C 7.0 (H) 04/28/2020    Assessment & Plan   1. CAD: S/p remote PCI to distal RCA in 1988, CABG x 5 (LIMA-D1, SVG-LAD, SVG-OM1, SVG-OM2, SVG-RCA) in 1994, DES x 2-LCx in 2010, PTCA-LCx, DES-SVG-LAD in 2014. Stable with no anginal symptoms. No indication for ischemic evaluation. Continue aspirin, Plavix, amlodipine, metoprolol, valsartan, hydrochlorothiazide, Imdur, Crestor, and Zetia.   2. Hypertension: BP well controlled. Continue current antihypertensive regimen.   3. Hyperlipidemia: LDL was 56 in  08/2022.  Continue Crestor, Zetia.  4. CKD stage III: Creatinine was stable at 1.98 in 08/2022.  5.  OSA: Adherent to CPAP. Denies any concerns.   6. Disposition: Follow-up as scheduled with Dr. Tresa Endo in 03/2023.      Joylene Grapes, NP 10/18/2022, 8:52 AM

## 2022-10-27 ENCOUNTER — Other Ambulatory Visit: Payer: Self-pay | Admitting: Nurse Practitioner

## 2022-10-27 DIAGNOSIS — I25810 Atherosclerosis of coronary artery bypass graft(s) without angina pectoris: Secondary | ICD-10-CM

## 2022-10-27 DIAGNOSIS — I10 Essential (primary) hypertension: Secondary | ICD-10-CM

## 2022-11-30 ENCOUNTER — Other Ambulatory Visit: Payer: Self-pay | Admitting: Cardiovascular Disease

## 2022-11-30 ENCOUNTER — Other Ambulatory Visit: Payer: Self-pay

## 2022-11-30 MED ORDER — ISOSORBIDE MONONITRATE ER 60 MG PO TB24: 60.0000 mg | ORAL_TABLET | Freq: Every day | ORAL | 3 refills | Status: DC

## 2022-12-17 DIAGNOSIS — H401132 Primary open-angle glaucoma, bilateral, moderate stage: Secondary | ICD-10-CM | POA: Diagnosis not present

## 2023-02-17 ENCOUNTER — Other Ambulatory Visit: Payer: Self-pay | Admitting: Cardiovascular Disease

## 2023-02-18 ENCOUNTER — Telehealth: Payer: Self-pay | Admitting: Family Medicine

## 2023-02-18 NOTE — Telephone Encounter (Signed)
Pt was referred to set up appt with Sports Med.  Patient Name First: Joshua Last: Hahn Gender: Male DOB: 04-16-1946 Age: 77 Y 2 M 24 D Return Phone Number: [phone] (Primary), [phone] (Secondary) Address: City/ State/ Zip: [place]  [address] Client [facility] Night - Human resources officer [facility] Night Provider Tana Conch- MD Contact Type Call Who Is Calling Patient / Member / Family / Caregiver Call Type Triage / Clinical Relationship To Patient Self Return Phone Number [phone] (Primary) Chief Complaint Arm Pain (no known cause) Reason for Call Symptomatic / Request for Health Information Initial Comment Caller states he has had some problems with his shoulder and was advised to get an MRI. Translation No Nurse Assessment Nurse: Tennis Ship, RN, Clarisse Gouge Date/Time [name] Time): 02/18/2023 12:23:29 PM Confirm and document reason for call. If symptomatic, describe symptoms. ---Caller states went to chiropractor and he recommended MRI for right shoulder pain (spur). No injury, pain for a month or so. Does the patient have any new or worsening symptoms? ---Yes Will a triage be completed? ---Yes Related visit to physician within the last 2 weeks? ---Yes Does the PT have any chronic conditions? (i.e. diabetes, asthma, this includes High risk factors for pregnancy, etc.) ---Yes List chronic conditions. ---HTN, heart dz, Is this a behavioral health or substance abuse call? ---No Guidelines Guideline Title Affirmed Question Affirmed Notes Nurse Date/Time [name] Time) Shoulder Pain [1] MODERATE pain (e.g., interferes with normal activities) AND [2] present > 3 days Lorenza Chick 02/18/2023 12:26:16 PM Disp. Time [name] Time) Disposition Final User 02/18/2023 12:30:58 PM SEE PCP WITHIN 3 DAYS Yes Tennis Ship RN, Clarisse Gouge Final Disposition 02/18/2023 12:30:58 PM SEE PCP WITHIN 3 DAYS Yes  Tennis Ship, RN, Clarisse Gouge Caller Disagree/Comply Comply Caller Understands Yes PreDisposition Call Doctor Care Advice Given Per Guideline SEE PCP WITHIN 3 DAYS: * You need to be seen within 2 or 3 days. * ACETAMINOPHEN - EXTRA STRENGTH TYLENOL: Take 1,000 mg (two 500 mg pills) every 6 to 8 hours as needed. Each Extra Strength Tylenol pill has 500 mg of acetaminophen. The most you should take is 6 pills a day (3,000 mg total). Note: In [place], the maximum is 8 pills a day (4,000 mg total). PAIN MEDICINES: CALL BACK IF: * Severe pain occurs * Chest pain or breathing difficulty occurs * Signs of infection occur (such as spreading redness, warmth, fever) * You become worse CARE ADVICE given per Shoulder Pain (Adult) guideline. Comments User: Shanon Rosser, RN Date/Time [name] Time): 02/18/2023 12:32:59 PM Caller has had xrays done by chiropractor, advised to get MRI, needs order and to know if PCP wants to see first. Please call patient back. Referrals REFERRED TO PCP OFFICE

## 2023-02-19 NOTE — Telephone Encounter (Signed)
Sports med appt has been set for 10/17.

## 2023-02-20 NOTE — Progress Notes (Unsigned)
   Rubin Payor, PhD, LAT, ATC acting as a scribe for Clementeen Graham, MD.  Joshua Hahn is a 77 y.o. male who presents to [facility] today for right shoulder pain x 1 month. No injury. Pain w/ certain AROM. He is RHD.  Radiates: no Aggravates: aBd, IR Treatments tried: Tylenol, creams  Dx imaging: 06/14/20 R shoulder XR  Pertinent review of systems: No fevers or chills  Relevant historical information: Coronary artery disease.  History of prostate cancer.  CKD 3. Shoulder arthritis  Exam:  BP (!) 154/88   Pulse 66   Ht 5\' 11"  (1.803 m)   Wt 213 lb (96.6 kg)   SpO2 97%   BMI 29.71 kg/m  General: Well Developed, well nourished, and in no acute distress.   MSK: Right shoulder normal-appearing. Decreased range of motion.   Abduction 120 degrees.  Functional internal rotation lumbar spine external rotation 20 degrees beyond neutral position. Strength with is intact within limits of motion.    Lab and Radiology Results  Procedure: Real-time Ultrasound Guided Injection of right shoulder glenohumeral joint posterior approach Device: Philips Affiniti 50G/GE Logiq Images permanently stored and available for review in PACS Verbal informed consent obtained.  Discussed risks and benefits of procedure. Warned about infection, bleeding, hyperglycemia damage to structures among others. Patient expresses understanding and agreement Time-out conducted.   Noted no overlying erythema, induration, or other signs of local infection.   Skin prepped in a sterile fashion.   Local anesthesia: Topical Ethyl chloride.   With sterile technique and under real time ultrasound guidance: 40 mg of Kenalog and 2 mL of Marcaine injected into glenohumeral joint. Fluid seen entering the joint capsule.   Completed without difficulty   Pain immediately resolved suggesting accurate placement of the medication.   Advised to call if fevers/chills, erythema, induration,  drainage, or persistent bleeding.   Images permanently stored and available for review in the ultrasound unit.  Impression: Technically successful ultrasound guided injection.     X-ray images right shoulder obtained at chiropractor provided by patient today personally and independently interpreted. Significant glenohumeral DJD.    Assessment and Plan: 77 y.o. male with right shoulder pain thought to be due to glenohumeral DJD.  Plan for steroid injection interarticular joint today.  Consider formal physical therapy.  Recheck back as needed.   PDMP not reviewed this encounter. Orders Placed This Encounter  Procedures   [place] LIMITED JOINT SPACE STRUCTURES UP RIGHT(NO LINKED CHARGES)    Order Specific Question:   Reason for Exam (SYMPTOM  OR DIAGNOSIS REQUIRED)    Answer:   right shoulder pain    Order Specific Question:   Preferred imaging location?    Answer:   [facility]   No orders of the defined types were placed in this encounter.    Discussed warning signs or symptoms. Please see discharge instructions. Patient expresses understanding.   The above documentation has been reviewed and is accurate and complete Clementeen Graham, M.D.

## 2023-02-21 ENCOUNTER — Ambulatory Visit: Payer: Medicare Other | Admitting: Family Medicine

## 2023-02-21 ENCOUNTER — Other Ambulatory Visit: Payer: Self-pay

## 2023-02-21 VITALS — BP 154/88 | HR 66 | Ht 71.0 in | Wt 213.0 lb

## 2023-02-21 DIAGNOSIS — G8929 Other chronic pain: Secondary | ICD-10-CM

## 2023-02-21 DIAGNOSIS — M19019 Primary osteoarthritis, unspecified shoulder: Secondary | ICD-10-CM | POA: Diagnosis not present

## 2023-02-21 DIAGNOSIS — M25511 Pain in right shoulder: Secondary | ICD-10-CM

## 2023-02-21 NOTE — Patient Instructions (Addendum)
Thank you for coming in today.   You received an injection today. Seek immediate medical attention if the joint becomes red, extremely painful, or is oozing fluid.    We could do PT.   Recheck as needed.

## 2023-03-11 ENCOUNTER — Ambulatory Visit: Payer: Medicare Other | Attending: Cardiovascular Disease | Admitting: Cardiovascular Disease

## 2023-03-11 ENCOUNTER — Encounter: Payer: Self-pay | Admitting: Cardiovascular Disease

## 2023-03-11 VITALS — BP 138/88 | HR 55 | Ht 70.5 in | Wt 210.0 lb

## 2023-03-11 DIAGNOSIS — I440 Atrioventricular block, first degree: Secondary | ICD-10-CM | POA: Diagnosis not present

## 2023-03-11 DIAGNOSIS — E785 Hyperlipidemia, unspecified: Secondary | ICD-10-CM | POA: Diagnosis not present

## 2023-03-11 DIAGNOSIS — Z951 Presence of aortocoronary bypass graft: Secondary | ICD-10-CM | POA: Insufficient documentation

## 2023-03-11 DIAGNOSIS — E782 Mixed hyperlipidemia: Secondary | ICD-10-CM | POA: Diagnosis not present

## 2023-03-11 DIAGNOSIS — G4733 Obstructive sleep apnea (adult) (pediatric): Secondary | ICD-10-CM | POA: Insufficient documentation

## 2023-03-11 DIAGNOSIS — I10 Essential (primary) hypertension: Secondary | ICD-10-CM | POA: Diagnosis not present

## 2023-03-11 DIAGNOSIS — I2510 Atherosclerotic heart disease of native coronary artery without angina pectoris: Secondary | ICD-10-CM | POA: Diagnosis not present

## 2023-03-11 NOTE — Patient Instructions (Signed)
Medication Instructions:  No Changes *If you need a refill on your cardiac medications before your next appointment, please call your pharmacy*  Lab Work: CMET CBC TSH Lipid  If you have labs (blood work) drawn today and your tests are completely normal, you will receive your results only by: MyChart Message (if you have MyChart) OR A paper copy in the mail If you have any lab test that is abnormal or we need to change your treatment, we will call you to review the results.   Testing/Procedures: None Ordered   Follow-Up: At [facility], you and your health needs are our priority.  As part of our continuing mission to provide you with exceptional heart care, we have created designated Provider Care Teams.  These Care Teams include your primary Cardiologist (physician) and Advanced Practice Providers (APPs -  Physician Assistants and Nurse Practitioners) who all work together to provide you with the care you need, when you need it.  We recommend signing up for the patient portal called "MyChart".  Sign up information is provided on this After Visit Summary.  MyChart is used to connect with patients for Virtual Visits (Telemedicine).  Patients are able to view lab/test results, encounter notes, upcoming appointments, etc.  Non-urgent messages can be sent to your provider as well.   To learn more about what you can do with MyChart, go to [email or web address].    Your next appointment:   6 month(s)  Provider:   Nicki Guadalajara, MD

## 2023-03-16 ENCOUNTER — Encounter: Payer: Self-pay | Admitting: Cardiovascular Disease

## 2023-03-16 NOTE — Progress Notes (Signed)
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   HDL 43 11/22/2014 1117   CHOLHDL 3.0 08/16/2022 1356   CHOLHDL 3.4 08/17/2015 1158   VLDL 32 (H) 08/17/2015 1158   LDLCALC 56 08/16/2022 1356   LDLCALC 62 11/22/2014 1117   LDLDIRECT 66.0 01/09/2018 1229     RADIOLOGY: No results found.  IMPRESSION:   1. Coronary artery disease involving native coronary artery of native heart without angina pectoris   2. Hx of CABG   3. Essential hypertension   4. Hyperlipidemia LDL goal <55   5. Mixed hyperlipidemia   6. OSA on CPAP   7. First degree AV block     ASSESSMENT AND PLAN: Rev. Delon is a 77 year-old African-American male who is over 36 years following his initial PTCA of his distal RCA and 30 years following his inferior wall myocardial infarction and subsequent CABG revascularization surgery. He is status post complex rotational atherectomy and diffuse stenting of his native circumflex vessel which also supplied his RCA territory. He developed unstable anginal symptoms in October 2014 and had mild positive troponin ruling in for non-ST segment MI. Catheterization in 02/04/2013 showed  focal 95% in-stent restenosis in the distal third of the previously placed stent in the circumflex vessel. This stenosis occurred after the collateral  of the circumflex supplying the distal right coronary artery. He also had high grade 85-90% near ostial stenosis in the  vein graft supplying the LAD. Fortunately he underwent successful two-vessel coronary intervention with cutting balloon angiosculpt to the circumflex in-stent restenosis, and PTCA/insertion of a new DES stent in the ostium of the vein graft supplying the LAD.  When I last saw him 3 years ago in December 2018 he continued to be stable from an anginal standpoint and denied any recurrent chest pain symptomatology on isosorbide 60 mg, metoprolol which he was only taking 25 mg daily of the tartrate preparation in addition to Ranexa and losartan.  At that time his blood pressure was elevated and medication adjustment was made.  When I saw him in December 2021 after not having seen him in 3 years, his blood pressure was elevated and there was  mild T wave abnormalities.  Blood pressure medications were increased.  A Lexiscan Myoview study showed old prior scar with minimal peri-infarction ischemia in the basal to mid inferior wall and inferolaterally.  His blood pressure had improved but today blood pressure continues to be elevated on a regimen consisting of metoprolol tartrate 25 mg in the morning and 12.5 mg at night, isosorbide 60 mg daily, and valsartan 80 mg.  He has a history of renal insufficiency and when he was evaluated in the emergency room and tested positive for COVID on Sep 29, 2020 his creatinine had increased to 1.96.  Most recent laboratory from August 16, 2022 showed stable class IIIb CKD with creatinine 1.98.  Lipid studies were excellent with total cholesterol 117, triglyceride 123, LDL cholesterol 56 with HDL 39 and VLDL 22.  LFTs are normal.  He has been maintained on multi medical anginal blood pressure medical regimen consisting of  amlodipine 5 mg, isosorbide 60 mg, metoprolol tartrate 25 mg in the morning and 12.5 mg in the evening, ranolazine 500 mg twice a day and he has been on low-dose valsartan 80 mg without worsening renal function.  Clinically he is chest pain-free.  He is maintained on long-term DAPT with aspirin/Plavix and is without bleeding.  He is on aggressive lipid-lowering therapy with Zetia 10 mg, rosuvastatin 40 mg, and Lovaza 1 Twice a day.  I am recommending repeat laboratory with a comprehensive metabolic panel, CBC, TSH and lipid studies.  I again discussed the importance of continued CPAP use.  He has reinstituted therapy.  He has a new ResMed air sense 11 AutoSet unit with set up date Sep 08, 2020.  Pressures are set at a range of 13 to 18 cm.  We will obtain repeat download in several months to a reassess compliance now that he has restarted treatment.  I will notify him regarding his laboratory.  He will monitor his home blood pressure and if elevated notify us.  We discussed my potential future retirement and he shared with me letter of thanks that he had written to me in appreciation of my many years of service and caring for him.  It has been an honor to take care of him for these many years.  I will see him in 6 months for follow-up evaluation.   Lennette Bihari, MD, [facility]  03/16/2023 11:04 AM

## 2023-03-28 DIAGNOSIS — N183 Chronic kidney disease, stage 3 unspecified: Secondary | ICD-10-CM | POA: Diagnosis not present

## 2023-03-28 DIAGNOSIS — I2510 Atherosclerotic heart disease of native coronary artery without angina pectoris: Secondary | ICD-10-CM | POA: Diagnosis not present

## 2023-03-28 DIAGNOSIS — C61 Malignant neoplasm of prostate: Secondary | ICD-10-CM | POA: Diagnosis not present

## 2023-03-28 DIAGNOSIS — I129 Hypertensive chronic kidney disease with stage 1 through stage 4 chronic kidney disease, or unspecified chronic kidney disease: Secondary | ICD-10-CM | POA: Diagnosis not present

## 2023-04-02 DIAGNOSIS — E785 Hyperlipidemia, unspecified: Secondary | ICD-10-CM | POA: Diagnosis not present

## 2023-04-02 DIAGNOSIS — I10 Essential (primary) hypertension: Secondary | ICD-10-CM | POA: Diagnosis not present

## 2023-04-02 DIAGNOSIS — I2510 Atherosclerotic heart disease of native coronary artery without angina pectoris: Secondary | ICD-10-CM | POA: Diagnosis not present

## 2023-04-03 LAB — COMPREHENSIVE METABOLIC PANEL
ALT: 25 [IU]/L (ref 0–44)
AST: 32 [IU]/L (ref 0–40)
Albumin: 4.1 g/dL (ref 3.8–4.8)
Alkaline Phosphatase: 85 [IU]/L (ref 44–121)
BUN/Creatinine Ratio: 10 (ref 10–24)
BUN: 19 mg/dL (ref 8–27)
Bilirubin Total: 0.5 mg/dL (ref 0.0–1.2)
CO2: 25 mmol/L (ref 20–29)
Calcium: 10.2 mg/dL (ref 8.6–10.2)
Chloride: 103 mmol/L (ref 96–106)
Creatinine, Ser: 1.97 mg/dL — ABNORMAL HIGH (ref 0.76–1.27)
Globulin, Total: 2.5 g/dL (ref 1.5–4.5)
Glucose: 96 mg/dL (ref 70–99)
Potassium: 5.2 mmol/L (ref 3.5–5.2)
Sodium: 140 mmol/L (ref 134–144)
Total Protein: 6.6 g/dL (ref 6.0–8.5)
eGFR: 34 mL/min/{1.73_m2} — ABNORMAL LOW (ref >59)

## 2023-04-03 LAB — CBC
Hematocrit: 46.3 % (ref 37.5–51.0)
Hemoglobin: 14.4 g/dL (ref 13.0–17.7)
MCH: 29.9 pg (ref 26.6–33.0)
MCHC: 31.1 g/dL — ABNORMAL LOW (ref 31.5–35.7)
MCV: 96 fL (ref 79–97)
Platelets: 202 10*3/uL (ref 150–450)
RBC: 4.82 x10E6/uL (ref 4.14–5.80)
RDW: 12 % (ref 11.6–15.4)
WBC: 6.9 10*3/uL (ref 3.4–10.8)

## 2023-04-03 LAB — LIPID PANEL
Chol/HDL Ratio: 3.1 {ratio} (ref 0.0–5.0)
Cholesterol, Total: 122 mg/dL (ref 100–199)
HDL: 40 mg/dL (ref >39)
LDL Chol Calc (NIH): 63 mg/dL (ref 0–99)
Triglycerides: 104 mg/dL (ref 0–149)
VLDL Cholesterol Cal: 19 mg/dL (ref 5–40)

## 2023-04-03 LAB — TSH: TSH: 1.55 u[IU]/mL (ref 0.450–4.500)

## 2023-04-11 ENCOUNTER — Ambulatory Visit: Payer: Medicare Other

## 2023-04-11 VITALS — Wt 210.0 lb

## 2023-04-11 DIAGNOSIS — Z0000 Encounter for general adult medical examination without abnormal findings: Secondary | ICD-10-CM | POA: Diagnosis not present

## 2023-04-11 NOTE — Progress Notes (Signed)
Subjective:   Joshua Hahn is a 77 y.o. male who presents for Medicare Annual/Subsequent preventive examination.  Visit Complete: Virtual I connected with  Joshua Hahn on 04/11/23 by a audio enabled telemedicine application and verified that I am speaking with the correct person using two identifiers.  Patient Location: Home  Provider Location: Home Office  I discussed the limitations of evaluation and management by telemedicine. The patient expressed understanding and agreed to proceed.  Vital Signs: Because this visit was a virtual/telehealth visit, some criteria may be missing or patient reported. Any vitals not documented were not able to be obtained and vitals that have been documented are patient reported.  Patient Medicare AWV questionnaire was completed by the patient on 04/08/23; I have confirmed that all information answered by patient is correct and no changes since this date.  Cardiac Risk Factors include: advanced age (>62men, >28 women);male gender;hypertension;dyslipidemia     Objective:    Today's Vitals   04/11/23 1130  Weight: 210 lb (95.3 kg)   Body mass index is 29.71 kg/m.     04/11/2023   11:35 AM 04/05/2022   11:01 AM 08/14/2021    8:15 PM 03/23/2021   11:13 AM 09/29/2020    6:12 PM 05/12/2020    8:55 PM 03/21/2020   12:23 PM  Advanced Directives  Does Patient Have a Medical Advance Directive? Yes Yes Yes Yes Yes Yes Yes  Type of Estate agent of [place];Living will Healthcare Power of [place];Living will Living will Healthcare Power of Attorney Living will;Healthcare Power of [employer] Power of [place];Living will Healthcare Power of [place];Living will  Does patient want to make changes to medical advance directive?   No - Patient declined   No - Patient declined   Copy of Healthcare Power of Attorney in Chart? No - copy requested No - copy requested  No - copy requested  No - copy requested   Would patient like  information on creating a medical advance directive?   No - Patient declined    No - Patient declined    Current Medications (verified) Outpatient Encounter Medications as of 04/11/2023  Medication Sig   Ascorbic Acid (VITAMIN C PO) Take by mouth.   aspirin 81 MG tablet Take 81 mg by mouth daily.   cholecalciferol (VITAMIN D) 1000 UNITS tablet Take 1,000 Units by mouth daily.   clopidogrel (PLAVIX) 75 MG tablet TAKE 1 TABLET BY MOUTH EVERY DAY   Coenzyme Q10 (CO Q 10) 100 MG CAPS Take 300 mg by mouth every evening.   dorzolamide (TRUSOPT) 2 % ophthalmic solution SMARTSIG:In Eye(s)   ezetimibe (ZETIA) 10 MG tablet TAKE 1 TABLET BY MOUTH EVERY DAY   isosorbide mononitrate (IMDUR) 60 MG 24 hr tablet Take 1 tablet (60 mg total) by mouth daily.   metoprolol tartrate (LOPRESSOR) 25 MG tablet TAKE ONE TABLET IN THE MORNING AND HALF TABLET IN THE EVENING.   nitroGLYCERIN (NITROSTAT) 0.4 MG SL tablet Place 1 tablet (0.4 mg total) under the tongue every 5 (five) minutes as needed for chest pain.   omega-3 acid ethyl esters (LOVAZA) 1 g capsule Take 1 capsule (1 g total) by mouth 2 (two) times daily.   ranolazine (RANEXA) 500 MG 12 hr tablet TAKE 1 TABLET BY MOUTH TWICE A DAY   rosuvastatin (CRESTOR) 40 MG tablet Take 1 tablet (40 mg total) by mouth daily. Please keep scheduled appointment   valsartan (DIOVAN) 80 MG tablet Take 1 tablet (80 mg total)  by mouth daily.   vitamin B-12 (CYANOCOBALAMIN) 100 MCG tablet Take 100 mcg by mouth daily.    amLODipine (NORVASC) 5 MG tablet Take 1 tablet (5 mg total) by mouth daily.   hydrochlorothiazide (HYDRODIURIL) 25 MG tablet Take 0.5 tablets (12.5 mg total) by mouth as needed (swelling).   No facility-administered encounter medications on file as of 04/11/2023.    Allergies (verified) Patient has no known allergies.   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 levels LDL IIIa+b% and HDL2b%   Hypertension    Myocardial infarct [facility])    Myocardial infarction (HCC) 1994   [facility]   Prostate cancer [facility])    Sleep apnea    uses CPAP PRN   Sleep apnea, obstructive    using C-PAP   Past Surgical History:  Procedure Laterality Date   ABDOMINAL SURGERY     CARDIAC CATHETERIZATION  06/23/2008   occlusion sequential OM1 andOM2 graft as well vein graft to RCA.LAD and diagonal system remain intact. RCA  collateralized   CORONARY ANGIOPLASTY WITH STENT PLACEMENT     CORONARY ARTERY BYPASS GRAFT  1994   EXPLORATORY LAPAROTOMY     ideopathic retroperitoneal fibrosis   LEFT HEART CATHETERIZATION WITH CORONARY/GRAFT ANGIOGRAM N/A 02/04/2013   Procedure: LEFT HEART CATHETERIZATION WITH Isabel Caprice;  Surgeon: Lennette Bihari, MD;  Location: [facility] CATH LAB;  Service: Cardiovascular;  Laterality: N/A;   LEFT PAROTID GLAND SURGRY  05/2009   Dr. Lazarus Salines   PERCUTANEOUS CORONARY STENT INTERVENTION (PCI-S)  06/25/2008   rotational atherectomy w/diffuse disease native circ. w/drug eluting stents , PTCA on one vessel; grat occlusion to RCA w/native RCA proxim. occlusion. Circ.supplied collaaterals distal RCA  following intervention   PERCUTANEOUS CORONARY STENT INTERVENTION (PCI-S) N/A 02/05/2013   Procedure: PERCUTANEOUS CORONARY STENT INTERVENTION (PCI-S);  Surgeon: Lennette Bihari, MD;  Location[facility] C[facility] LAB;  Service: Cardiovascular;  Laterality: N/A;   PROSTATECTOMY  06/2009   robotic-asssisted laparoscopic radical by Dr Earlene Plater   Family History  Problem Relation Age of Onset   Pancreatic cancer Father    Heart disease Father    Hypertension Father    Stroke  Mother    Hypertension Mother    Coronary artery disease Brother 79       2 bros with early CAD   Hypertension Brother    Coronary artery disease Brother    Hypertension Brother    Coronary artery disease Brother    Hypertension Brother    Heart attack Maternal Grandfather    Hypertension Paternal Grandfather    Lupus Daughter    Hypertension Son    Hypertension Sister    Hypertension Sister    Throat cancer Brother    Colon cancer Neg Hx    Esophageal cancer Neg Hx    Stomach cancer Neg Hx    Rectal cancer Neg Hx    Social History   Socioeconomic History   Marital status: Married    Spouse name: Not on file   Number of children: 2   Years of education: Not on file   Highest  education level: Not on file  Occupational History   Occupation: Education officer, environmental -retired     Comment: [employer] [place]   Tobacco Use   Smoking status: Former    Current packs/day: 0.00    Types: Cigarettes    Quit date: 05/07/1973    Years since quitting: 49.9    Passive exposure: Never   Smokeless tobacco: Never  Vaping Use   Vaping status: Never Used  Substance and Sexual Activity   Alcohol use: No   Drug use: No   Sexual activity: Not Currently  Other Topics Concern   Not on file  Social History Narrative   ** Merged History Encounter **       Married 48 years in 2016. 2 children. 5 grand kids.   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   Hobbies: speaking events, traveling   Social Determinants of Health   Financial Resource Strain: Low Risk  (04/08/2023)   Overall Financial Resource Strain (CARDIA)    Difficulty of Paying Living Expenses: Not hard at all  Food Insecurity: No Food Insecurity (04/08/2023)   Hunger Vital Sign    Worried About Running Out of Food in the Last Year: Never true    Ran Out of Food in the Last Year: Never true  Transportation Needs: No Transportation Needs (04/08/2023)   PRAPARE - Administrator, Civil Service (Medical): No    Lack  of Transportation (Non-Medical): No  Physical Activity: Insufficiently Active (04/08/2023)   Exercise Vital Sign    Days of Exercise per Week: 3 days    Minutes of Exercise per Session: 30 min  Stress: No Stress Concern Present (04/08/2023)   [employer] of Occupational Health - Occupational Stress Questionnaire    Feeling of Stress : Not at all  Social Connections: Socially Integrated (04/08/2023)   Social Connection and Isolation Panel [NHANES]    Frequency of Communication with Friends and Family: More than three times a week    Frequency of Social Gatherings with Friends and Family: Three times a week    Attends Religious Services: More than 4 times per year    Active Member of Clubs or Organizations: Yes    Attends Engineer, structural: More than 4 times per year    Marital Status: Married    Tobacco Counseling Counseling given: Not Answered   Clinical Intake:  Pre-visit preparation completed: Yes  Pain : No/denies pain     BMI - recorded: 29.71 Nutritional Status: BMI 25 -29 Overweight Nutritional Risks: None Diabetes: No  How often do you need to have someone help you when you read instructions, pamphlets, or other written materials from your doctor or pharmacy?: 1 - Never  Interpreter Needed?: No  Information entered by :: Lanier Ensign, LPN   Activities of Daily Living    04/08/2023   12:17 PM  In your present state of health, do you have any difficulty performing the following activities:  Hearing? 0  Vision? 0  Difficulty concentrating or making decisions? 0  Walking or climbing stairs? 0  Dressing or bathing? 0  Doing errands, shopping? 0  Preparing Food and eating ? N  Using the Toilet? N  In the past six months, have you accidently leaked urine? N  Do you have problems with loss of bowel control? N  Managing your Medications? N  Managing your Finances? N  Housekeeping or managing your Housekeeping? N    Patient Care  Team: Shelva Majestic,  MD as PCP - General (Family Medicine) Lennette Bihari, MD as PCP - Cardiology (Cardiology) Shelva Majestic, MD (Family Medicine) Jarold Motto, [place] as Physician Assistant (Physician Assistant) Krista Blue, [place], OD as Consulting Physician (Optometry) Earlene Plater, Rita Ohara, [facility] (Inactive) as Pharmacist (Pharmacist)  Indicate any recent Medical Services you may have received from other than [facility] providers in the past year (date may be approximate).     Assessment:   This is a routine wellness examination for Karla.  Hearing/Vision screen Hearing Screening - Comments:: Pt denies any hearing issues  Vision Screening - Comments:: Pt [facility] for annual [facility] exams    Goals Addressed             This Visit's Progress    Patient Stated       Stay strong and maintain health and activity        Depression Screen    04/11/2023   11:35 AM 04/05/2022   10:59 AM 03/23/2021   11:12 AM 06/14/2020   11:12 AM 02/10/2019   10:25 AM 01/01/2018    9:24 AM 01/01/2018    8:43 AM  PHQ 2/9 Scores  PHQ - 2 Score 0 0 0 0 0 0 0  PHQ- 9 Score      0     Fall Risk    04/08/2023   12:17 PM 04/05/2022   11:02 AM 03/23/2021   11:14 AM 06/14/2020   11:12 AM 02/10/2019   10:25 AM  Fall Risk   Falls in the past year? 0 0 0 1 0  Number falls in past yr:  0 0 0   Injury with Fall?  0 0 1 0  Risk for fall due to : No Fall Risks Impaired vision Impaired vision    Follow up Falls prevention discussed Falls prevention discussed Falls prevention discussed  Education provided;Falls prevention discussed;Falls evaluation completed    MEDICARE RISK AT HOME: Medicare Risk at Home Any stairs in or around the home?: Yes If so, are there any without handrails?: Yes Home free of loose throw rugs in walkways, pet beds, electrical cords, etc?: Yes Adequate lighting in your home to reduce risk of falls?: Yes Life alert?: No Use of a cane, walker or w/c?: No Grab bars in  the bathroom?: No Shower chair or bench in shower?: No Elevated toilet seat or a handicapped toilet?: Yes  TIMED UP AND GO:  Was the test performed?  No    Cognitive Function:        04/05/2022   11:03 AM 03/23/2021   11:16 AM 02/10/2019   10:26 AM 01/01/2018    8:49 AM  6CIT Screen  What Year? 0 points 0 points 0 points 0 points  What month? 0 points 0 points 0 points 0 points  What time? 0 points 0 points 0 points 0 points  Count back from 20 0 points 0 points 0 points 0 points  Months in reverse 0 points 0 points 0 points 0 points  Repeat phrase 0 points 0 points 0 points   Total Score 0 points 0 points 0 points     Immunizations Immunization History  Administered Date(s) Administered   Hep A, Unspecified 05/17/1997, 12/28/1997   Hep B, Unspecified 12/28/1997, 06/16/1998, 11/07/1999   Influenza-Unspecified 08/06/2017, 02/20/2021, 03/14/2022   Meningococcal polysaccharide vaccine (MPSV4) 05/17/1997   OPV 01/24/1990   PFIZER Comirnaty(Gray Top)Covid-19 Tri-Sucrose Vaccine 02/24/2020, 11/30/2020   PFIZER(Purple Top)SARS-COV-2 Vaccination 05/26/2019, 06/15/2019, 02/24/2020   Pfizer  Covid-19 Vaccine Bivalent Booster 25yrs & up 05/11/2021   Pneumococcal Conjugate-13 09/28/2014   Pneumococcal Polysaccharide-23 07/10/2017   Rsv, Bivalent, Protein Subunit Rsvpref,pf Verdis Frederickson) 03/26/2023   Td 06/17/1997, 05/08/1999, 10/08/2014   Tdap 12/16/2006, 01/14/2017   Typhoid Inactivated 06/16/1998, 12/16/2006, 01/14/2017   Yellow Fever 01/24/1990, 12/16/2006   Zoster Recombinant(Shingrix) 02/10/2019, 10/09/2019    TDAP status: Up to date  Flu Vaccine status: Due, Education has been provided regarding the importance of this vaccine. Advised may receive this vaccine at local pharmacy or Health Dept. Aware to provide a copy of the vaccination record if obtained from local pharmacy or Health Dept. Verbalized acceptance and understanding.  Pneumococcal vaccine status: Up to  date  Covid-19 vaccine status: Information provided on how to obtain vaccines.   Qualifies for Shingles Vaccine? Yes   Zostavax completed Yes   Shingrix Completed?: Yes  Screening Tests Health Maintenance  Topic Date Due   COVID-19 Vaccine (7 - 2023-24 season) 01/06/2023   INFLUENZA VACCINE  08/05/2023 (Originally 12/06/2022)   Medicare Annual Wellness (AWV)  04/10/2024   DTaP/Tdap/Td (6 - Td or Tdap) 01/15/2027   Colonoscopy  07/16/2027   Pneumonia Vaccine 54+ Years old  Completed   Hepatitis C Screening  Completed   Zoster Vaccines- Shingrix  Completed   HPV VACCINES  Aged Out    Health Maintenance  Health Maintenance Due  Topic Date Due   COVID-19 Vaccine (7 - 2023-24 season) 01/06/2023    Colorectal cancer screening: Type of screening: Colonoscopy. Completed 07/16/22. Repeat every 5 years   Additional Screening:  Hepatitis C Screening: Completed 01/09/18  Vision Screening: Recommended annual ophthalmology exams for early detection of glaucoma and other disorders of the eye. Is the patient up to date with their annual eye exam?  Yes  Who is the provider or what is the name of the office in which the patient attends annual eye exams? [facility]  If pt is not established with a provider, would they like to be referred to a provider to establish care? No .   Dental Screening: Recommended annual dental exams for proper oral hygiene   Community Resource Referral / Chronic Care Management: CRR required this visit?  No   CCM required this visit?  No     Plan:     I have personally reviewed and noted the following in the patient's chart:   Medical and social history Use of alcohol, tobacco or illicit drugs  Current medications and supplements including opioid prescriptions. Patient is not currently taking opioid prescriptions. Functional ability and status Nutritional status Physical activity Advanced directives List of other physicians Hospitalizations,  surgeries, and ER visits in previous 12 months Vitals Screenings to include cognitive, depression, and falls Referrals and appointments  In addition, I have reviewed and discussed with patient certain preventive protocols, quality metrics, and best practice recommendations. A written personalized care plan for preventive services as well as general preventive health recommendations were provided to patient.     Marzella Schlein, LPN   [id number]   After Visit Summary: (MyChart) Due to this being a telephonic visit, the after visit summary with patients personalized plan was offered to patient via MyChart   Nurse Notes: none

## 2023-04-15 NOTE — Patient Instructions (Signed)
Mr. Bracknell , Thank you for taking time to come for your Medicare Wellness Visit. I appreciate your ongoing commitment to your health goals. Please review the following plan we discussed and let me know if I can assist you in the future.   Referrals/Orders/Follow-Ups/Clinician Recommendations: Aim for 30 minutes of exercise or brisk walking, 6-8 glasses of water, and 5 servings of fruits and vegetables each day.   This is a list of the screening recommended for you and due dates:  Health Maintenance  Topic Date Due   COVID-19 Vaccine (7 - 2023-24 season) 01/06/2023   Flu Shot  08/05/2023*   Medicare Annual Wellness Visit  04/10/2024   DTaP/Tdap/Td vaccine (6 - Td or Tdap) 01/15/2027   Colon Cancer Screening  07/16/2027   Pneumonia Vaccine  Completed   Hepatitis C Screening  Completed   Zoster (Shingles) Vaccine  Completed   HPV Vaccine  Aged Out  *Topic was postponed. The date shown is not the original due date.    Advanced directives: (Copy Requested) Please bring a copy of your health care power of attorney and living will to the office to be added to your chart at your convenience.  Next Medicare Annual Wellness Visit scheduled for next year: Yes

## 2023-04-19 ENCOUNTER — Encounter: Payer: Medicare Other | Admitting: Pharmacist

## 2023-05-04 ENCOUNTER — Other Ambulatory Visit: Payer: Self-pay | Admitting: Cardiovascular Disease

## 2023-05-24 ENCOUNTER — Ambulatory Visit: Payer: Medicare Other | Admitting: Family Medicine

## 2023-06-22 ENCOUNTER — Other Ambulatory Visit: Payer: Self-pay | Admitting: Cardiovascular Disease

## 2023-07-06 ENCOUNTER — Other Ambulatory Visit: Payer: Self-pay | Admitting: Cardiovascular Disease

## 2023-08-12 ENCOUNTER — Ambulatory Visit: Payer: Medicare Other | Admitting: Family Medicine

## 2023-08-16 ENCOUNTER — Emergency Department (HOSPITAL_BASED_OUTPATIENT_CLINIC_OR_DEPARTMENT_OTHER)
Admission: EM | Admit: 2023-08-16 | Discharge: 2023-08-16 | Disposition: A | Discharge status: Home / Self Care | Attending: Emergency Medicine | Admitting: Emergency Medicine

## 2023-08-16 ENCOUNTER — Other Ambulatory Visit: Payer: Self-pay

## 2023-08-16 ENCOUNTER — Emergency Department (HOSPITAL_BASED_OUTPATIENT_CLINIC_OR_DEPARTMENT_OTHER)

## 2023-08-16 ENCOUNTER — Telehealth: Payer: Self-pay | Admitting: Cardiovascular Disease

## 2023-08-16 ENCOUNTER — Encounter (HOSPITAL_BASED_OUTPATIENT_CLINIC_OR_DEPARTMENT_OTHER): Payer: Self-pay | Admitting: Emergency Medicine

## 2023-08-16 DIAGNOSIS — R0789 Other chest pain: Secondary | ICD-10-CM

## 2023-08-16 DIAGNOSIS — Z7982 Long term (current) use of aspirin: Secondary | ICD-10-CM | POA: Diagnosis not present

## 2023-08-16 DIAGNOSIS — Z79899 Other long term (current) drug therapy: Secondary | ICD-10-CM | POA: Insufficient documentation

## 2023-08-16 DIAGNOSIS — Z8546 Personal history of malignant neoplasm of prostate: Secondary | ICD-10-CM | POA: Diagnosis not present

## 2023-08-16 DIAGNOSIS — Z955 Presence of coronary angioplasty implant and graft: Secondary | ICD-10-CM | POA: Diagnosis not present

## 2023-08-16 DIAGNOSIS — N281 Cyst of kidney, acquired: Secondary | ICD-10-CM | POA: Diagnosis not present

## 2023-08-16 DIAGNOSIS — I10 Essential (primary) hypertension: Secondary | ICD-10-CM | POA: Diagnosis not present

## 2023-08-16 DIAGNOSIS — I517 Cardiomegaly: Secondary | ICD-10-CM | POA: Diagnosis not present

## 2023-08-16 DIAGNOSIS — R079 Chest pain, unspecified: Secondary | ICD-10-CM | POA: Diagnosis not present

## 2023-08-16 DIAGNOSIS — Z7902 Long term (current) use of antithrombotics/antiplatelets: Secondary | ICD-10-CM | POA: Diagnosis not present

## 2023-08-16 DIAGNOSIS — E869 Volume depletion, unspecified: Secondary | ICD-10-CM | POA: Diagnosis not present

## 2023-08-16 DIAGNOSIS — R0602 Shortness of breath: Secondary | ICD-10-CM | POA: Diagnosis not present

## 2023-08-16 DIAGNOSIS — I2510 Atherosclerotic heart disease of native coronary artery without angina pectoris: Secondary | ICD-10-CM | POA: Diagnosis not present

## 2023-08-16 DIAGNOSIS — I700 Atherosclerosis of aorta: Secondary | ICD-10-CM | POA: Diagnosis not present

## 2023-08-16 LAB — CBC
HCT: 41.9 % (ref 39.0–52.0)
Hemoglobin: 13.8 g/dL (ref 13.0–17.0)
MCH: 30.1 pg (ref 26.0–34.0)
MCHC: 32.9 g/dL (ref 30.0–36.0)
MCV: 91.5 fL (ref 80.0–100.0)
Platelets: 201 10*3/uL (ref 150–400)
RBC: 4.58 MIL/uL (ref 4.22–5.81)
RDW: 12.5 % (ref 11.5–15.5)
WBC: 6.9 10*3/uL (ref 4.0–10.5)
nRBC: 0 % (ref 0.0–0.2)

## 2023-08-16 LAB — BASIC METABOLIC PANEL WITH GFR
Anion gap: 7 (ref 5–15)
BUN: 21 mg/dL (ref 8–23)
CO2: 26 mmol/L (ref 22–32)
Calcium: 9.7 mg/dL (ref 8.9–10.3)
Chloride: 104 mmol/L (ref 98–111)
Creatinine, Ser: 2 mg/dL — ABNORMAL HIGH (ref 0.61–1.24)
GFR, Estimated: 34 mL/min — ABNORMAL LOW (ref >60)
Glucose, Bld: 121 mg/dL — ABNORMAL HIGH (ref 70–99)
Potassium: 4.6 mmol/L (ref 3.5–5.1)
Sodium: 137 mmol/L (ref 135–145)

## 2023-08-16 LAB — HEPATIC FUNCTION PANEL
ALT: 19 U/L (ref 0–44)
AST: 24 U/L (ref 15–41)
Albumin: 3.4 g/dL — ABNORMAL LOW (ref 3.5–5.0)
Alkaline Phosphatase: 66 U/L (ref 38–126)
Bilirubin, Direct: 0.1 mg/dL (ref 0.0–0.2)
Indirect Bilirubin: 0.5 mg/dL (ref 0.3–0.9)
Total Bilirubin: 0.6 mg/dL (ref 0.0–1.2)
Total Protein: 6.5 g/dL (ref 6.5–8.1)

## 2023-08-16 LAB — BRAIN NATRIURETIC PEPTIDE: B Natriuretic Peptide: 33.5 pg/mL (ref 0.0–100.0)

## 2023-08-16 LAB — TROPONIN I (HIGH SENSITIVITY)
Troponin I (High Sensitivity): 10 ng/L (ref <18)
Troponin I (High Sensitivity): 11 ng/L (ref <18)

## 2023-08-16 MED ORDER — NITROGLYCERIN 0.4 MG SL SUBL
0.4000 mg | SUBLINGUAL_TABLET | Freq: Once | SUBLINGUAL | Status: AC
  Administered 2023-08-16: 0.4 mg via SUBLINGUAL
  Filled 2023-08-16: qty 1

## 2023-08-16 MED ORDER — IOHEXOL 350 MG/ML SOLN
75.0000 mL | Freq: Once | INTRAVENOUS | Status: AC | PRN
  Administered 2023-08-16: 75 mL via INTRAVENOUS

## 2023-08-16 NOTE — Telephone Encounter (Signed)
  Per MyChart scheduling message:   Pt c/o of Chest Pain: STAT if active (IN THIS MOMENT) CP, including tightness, pressure, jaw pain, shoulder/upper arm/back pain, SOB, nausea, and vomiting.  1. Are you having CP right now (tightness, pressure, or discomfort)?   2. Are you experiencing any other symptoms (ex. SOB, nausea, vomiting, sweating)?   3. How long have you been experiencing CP?   4. Is your CP continuous or coming and going?   5. Have you taken Nitroglycerin?   6. If CP returns before callback, please consider calling 911. ?   1. Yes 2. Yes 3, no 4. Low but continuous  5. No

## 2023-08-16 NOTE — Discharge Instructions (Signed)
 I discussed with on-call cardiology.  They do recommend that you make an appointment follow-up with Dr. Tresa Endo they do agree that with these high-sensitivity troponins that it is very reassuring that these were normal today.  Return for any new or worse symptoms.

## 2023-08-16 NOTE — ED Provider Notes (Signed)
 [place] [facility] Provider Note   CSN: [id number] Arrival date & time: 08/16/23  1157     History  Chief Complaint  Patient presents with   Chest Pain    Joshua Hahn is a 78 y.o. male.  Patient is a 78 year old male with a history of CAD status post CABG in the 90s and stent placement in 2014, hypertension, mixed hyperlipidemia, obstructive sleep apnea CPAP therapy, prostate cancer, status post radical prostatectomy who is presenting today with 3 days of chest pain and sob.  He reports that he cannot really recall what he was doing when it started but the chest pain has been waxing and waning and is sometimes a pinching sensation and then sore in the left side of his chest.  However the shortness of breath has been all the time and seems to be worse with exertion.  He does not really feel that the chest pain is worse with exertion unless he is really doing something to strain.  Also noticed some mild worsening swelling in his lower extremities.  No cough, fever congestion.  No abdominal pain nausea or vomiting.  He did take a nitroglycerin prior to arrival but reports it did not really change the pain much.  He also notes that he seems to be getting tired more with exertion.  He has not had any recent immobilization or surgeries.  No prior history of blood clots.  He has been compliant with his medications.  No tobacco use.   Chest Pain      Home Medications Prior to Admission medications   Medication Sig Start Date End Date Taking? Authorizing Provider  amLODipine (NORVASC) 5 MG tablet Take 1 tablet (5 mg total) by mouth daily. 10/18/22 01/16/23  Joylene Grapes, NP  Ascorbic Acid (VITAMIN C PO) Take by mouth.    [provider]  aspirin 81 MG tablet Take 81 mg by mouth daily.    [provider]  cholecalciferol (VITAMIN D) 1000 UNITS tablet Take 1,000 Units by mouth daily.    [provider]  clopidogrel (PLAVIX) 75  MG tablet TAKE 1 TABLET BY MOUTH EVERY DAY 05/06/23   Lennette Bihari, MD  Coenzyme Q10 (CO Q 10) 100 MG CAPS Take 300 mg by mouth every evening.    [provider]  dorzolamide (TRUSOPT) 2 % ophthalmic solution SMARTSIG:In Eye(s)    [provider]  ezetimibe (ZETIA) 10 MG tablet TAKE 1 TABLET BY MOUTH EVERY DAY 02/19/23   Lennette Bihari, MD  hydrochlorothiazide (HYDRODIURIL) 25 MG tablet Take 0.5 tablets (12.5 mg total) by mouth as needed (swelling). 09/21/22 12/20/22  Lennette Bihari, MD  isosorbide mononitrate (IMDUR) 60 MG 24 hr tablet Take 1 tablet (60 mg total) by mouth daily. 11/30/22   Lennette Bihari, MD  metoprolol tartrate (LOPRESSOR) 25 MG tablet TAKE ONE TABLET IN THE MORNING AND HALF TABLET IN THE EVENING. 10/18/22   Monge, Petra Kuba, NP  nitroGLYCERIN (NITROSTAT) 0.4 MG SL tablet Place 1 tablet (0.4 mg total) under the tongue every 5 (five) minutes as needed for chest pain. 10/18/22   Joylene Grapes, NP  omega-3 acid ethyl esters (LOVAZA) 1 g capsule TAKE 1 CAPSULE BY MOUTH 2 TIMES DAILY. 07/08/23   Lennette Bihari, MD  ranolazine (RANEXA) 500 MG 12 hr tablet TAKE 1 TABLET BY MOUTH TWICE A DAY 06/24/23   Lennette Bihari, MD  rosuvastatin (CRESTOR) 40 MG tablet Take 1  tablet (40 mg total) by mouth daily. Please keep scheduled appointment 10/18/22   Joylene Grapes, NP  valsartan (DIOVAN) 80 MG tablet Take 1 tablet (80 mg total) by mouth daily. 10/18/22   Joylene Grapes, NP  vitamin B-12 (CYANOCOBALAMIN) 100 MCG tablet Take 100 mcg by mouth daily.     [provider]      Allergies    Patient has no known allergies.    Review of Systems   Review of Systems  Cardiovascular:  Positive for chest pain.    Physical Exam Updated Vital Signs BP (!) 146/65   Pulse 61   Temp 97.7 F (36.5 C) (Oral)   Resp (!) 22   Wt 92.1 kg   SpO2 99%   BMI 28.72 kg/m  Physical Exam Vitals and nursing note reviewed.  Constitutional:      General: He is not in acute  distress.    Appearance: He is well-developed.  HENT:     Head: Normocephalic and atraumatic.  Eyes:     Conjunctiva/sclera: Conjunctivae normal.     Pupils: Pupils are equal, round, and reactive to light.  Cardiovascular:     Rate and Rhythm: Normal rate and regular rhythm.     Heart sounds: No murmur heard. Pulmonary:     Effort: Pulmonary effort is normal. No respiratory distress.     Breath sounds: Examination of the right-lower field reveals rales. Examination of the left-lower field reveals rales. Rales present. No wheezing.  Abdominal:     General: There is no distension.     Palpations: Abdomen is soft.     Tenderness: There is no abdominal tenderness. There is no guarding or rebound.  Musculoskeletal:        General: No tenderness. Normal range of motion.     Cervical back: Normal range of motion and neck supple.     Right lower leg: Edema present.     Left lower leg: Edema present.     Comments: Pitting edema to bilateral lower extremities 1+  Skin:    General: Skin is warm and dry.     Findings: No erythema or rash.  Neurological:     Mental Status: He is alert and oriented to person, place, and time. Mental status is at baseline.  Psychiatric:        Behavior: Behavior normal.     ED Results / Procedures / Treatments   Labs (all labs ordered are listed, but only abnormal results are displayed) Labs Reviewed  BASIC METABOLIC PANEL WITH GFR - Abnormal; Notable for the following components:      Result Value   Glucose, Bld 121 (*)    Creatinine, Ser 2.00 (*)    GFR, Estimated 34 (*)    All other components within normal limits  HEPATIC FUNCTION PANEL - Abnormal; Notable for the following components:   Albumin 3.4 (*)    All other components within normal limits  CBC  BRAIN NATRIURETIC PEPTIDE  TROPONIN I (HIGH SENSITIVITY)  TROPONIN I (HIGH SENSITIVITY)    EKG EKG Interpretation Date/Time:  Friday August 16 2023 12:04:21 EDT Ventricular Rate:  63 PR  Interval:  232 QRS Duration:  96 QT Interval:  374 QTC Calculation: 382 R Axis:   30  Text Interpretation: Sinus rhythm with 1st degree A-V block Left ventricular hypertrophy with repolarization abnormality ( R in aVL ) Inferior infarct , age undetermined When compared with ECG of 11-Mar-2023 13:24, PREVIOUS ECG IS PRESENT Confirmed by Anitra Lauth,  Alphonzo Lemmings ([id number]) on 08/16/2023 12:31:43 PM  Radiology DG Chest 2 View Result Date: 08/16/2023 CLINICAL DATA:  Chest pain. EXAM: CHEST - 2 VIEW COMPARISON:  Chest radiograph dated 07/03/2021. FINDINGS: Cardiomegaly. Prior median sternotomy and CABG. No overt edema, focal consolidation, pleural effusion, or pneumothorax. Degenerative changes of the spine. No acute osseous abnormality. IMPRESSION: 1. No acute cardiopulmonary findings. 2. Cardiomegaly. Electronically Signed   By: Hart Robinsons M.D.   On: 08/16/2023 14:40    Procedures Procedures    Medications Ordered in ED Medications - No data to display  ED Course/ Medical Decision Making/ A&P                                 Medical Decision Making Amount and/or Complexity of Data Reviewed External Data Reviewed: notes. Labs: ordered. Decision-making details documented in ED Course. Radiology: ordered and independent interpretation performed. Decision-making details documented in ED Course. ECG/medicine tests: ordered and independent interpretation performed. Decision-making details documented in ED Course.   Pt with multiple medical problems and comorbidities and presenting today with a complaint that caries a high risk for morbidity and mortality.  Here today with complaints of chest pain and shortness of breath.  Concern for CHF, ACS.  Lower suspicion for GI cause, pneumonia, dissection, PE.  Low suspicion for pneumothorax. I independently interpreted patient's labs and EKG.  EKG with no new findings.  Troponin x 2 is normal, BMP with a creatinine of 2.0 without acute change from prior,  CBC within normal limits and BNP is normal.  LFTs without acute findings.  I have independently visualized and interpreted pt's images today.  CXR wnl.  On return evaluation of the patient he is still having symptoms which still could be cardiac in nature just no ACS at this time however we will do a CT to make sure patient does not have clots causing his symptoms or pulmonary etiology.  Vital signs remained stable.  Discussed with the patient and his family.  If these are normal we will consult cardiology.         Final Clinical Impression(s) / ED Diagnoses Final diagnoses:  None    Rx / DC Orders ED Discharge Orders     None         Gwyneth Sprout, MD 08/16/23 1545

## 2023-08-16 NOTE — ED Provider Notes (Signed)
 Will discussed with on-call cardiology.  Patient's troponins x 2 without significant delta change.  CT angio chest also negative for any acute findings.  Patient tells me that he is pretty much had 3 days of nonstop chest discomfort.  And was concerned about a recurrence of his coronary artery disease he does have stents.  Apparently when he had cardiac cath in the past did not have significant changes lab wise but that was in 2010.  Patient appears quite comfortable.  Will discuss with cardiology to see if we are concerned about an unstable angina kind of picture but I would have expected his troponins to bump some.  Or whether they just want him to follow as an outpatient.  Was followed by Dr. Tresa Endo apparently Dr. Tresa Endo is retiring.  But still part of the group.   Vanetta Mulders, MD 08/16/23 1728

## 2023-08-16 NOTE — ED Triage Notes (Addendum)
 Left to mid chest tightness and pressure pain , shortness of breath x 3 days . No cough . Hx HTN, high cholesterol . HX MI , last stent placement 2010.  Took his one Nitroglycerin 20 min ago

## 2023-08-16 NOTE — Telephone Encounter (Signed)
 Attempted to call patient, no answer left message requesting a call back.

## 2023-08-19 NOTE — Telephone Encounter (Signed)
 Patient identification verified by 2 forms. Hilton Lucky, RN    Called and spoke to patient  Patient states:   -he was having chest pain on Friday   -chest pain initially started on Wednesday and persisted the remainder of the week   -he took NTG for the pain and it did help with some of the pain (NTGx1)   -He presented to ED and was discharged home   -was advised to follow up with cardiology   -after discharge chest pain did not fully go away only subsided   -continues to have chest pressure/discomfort but does not have intensity of the pain Patient denies:   -SOB/difficulty breathing   -pain in left arm/jaw/middle of back  Patient scheduled for OV 4/17 Reviewed NTG use/instruction  Reviewed ED warning signs/precautions  Patient verbalized understanding, no questions at this time

## 2023-08-22 ENCOUNTER — Ambulatory Visit: Attending: Cardiology | Admitting: Physician Assistant

## 2023-08-22 VITALS — BP 130/68 | HR 55 | Ht 70.5 in | Wt 209.0 lb

## 2023-08-22 DIAGNOSIS — N1832 Chronic kidney disease, stage 3b: Secondary | ICD-10-CM | POA: Diagnosis not present

## 2023-08-22 DIAGNOSIS — I25708 Atherosclerosis of coronary artery bypass graft(s), unspecified, with other forms of angina pectoris: Secondary | ICD-10-CM | POA: Diagnosis not present

## 2023-08-22 DIAGNOSIS — I10 Essential (primary) hypertension: Secondary | ICD-10-CM | POA: Insufficient documentation

## 2023-08-22 DIAGNOSIS — R079 Chest pain, unspecified: Secondary | ICD-10-CM | POA: Diagnosis not present

## 2023-08-22 DIAGNOSIS — E785 Hyperlipidemia, unspecified: Secondary | ICD-10-CM | POA: Insufficient documentation

## 2023-08-22 MED ORDER — ISOSORBIDE MONONITRATE ER 60 MG PO TB24
90.0000 mg | ORAL_TABLET | Freq: Every day | ORAL | 3 refills | Status: DC
Start: 2023-08-22 — End: 2023-09-10

## 2023-08-22 MED ORDER — RANOLAZINE ER 1000 MG PO TB12: 1000.0000 mg | ORAL_TABLET | Freq: Two times a day (BID) | ORAL | 5 refills | Status: DC

## 2023-08-22 NOTE — Progress Notes (Signed)
 Cardiology Office Note:  .   Date:  08/22/2023  ID:  Joshua Hahn, [place] [birth year], MRN [id number] PCP: Almira Jaeger, MD  [facility] Providers Cardiologist:  Magnus Schuller, MD     History of Present Illness: Joshua Hahn is a 78 y.o. male with PMH of CAD s/p CABG, HTN, HLD, and OSA.  Patient had a PTCA to distal RCA in 1988.  He had a inferior wall MI in 1994 and the subsequently underwent CABG with free LIMA to large D1, SVG to LAD, SVG to OM1 and OM 2, SVG to RCA.  Cardiac catheterization in 2010 showed significant native CAD with total occlusion of LAD, diffuse AV groove left circumflex stenosis, occluded SVG to RCA, patent LIMA to diagonal, patent SVG to LAD, occluded SVG to obtuse marginal.  He underwent complex intervention to native left circumflex on 08/23/2008 and had placement of 2 DES.  Myoview in January 2013 was low risk, fixed basal to mid inferior scar.  He developed recurrent chest pain in October 2014, repeat cardiac catheterization revealed 95% focal in-stent restenosis of the distal third of the stent in the left circumflex vessel, he also had high-grade lesion in the SVG to mid LAD.  Left circumflex vessel was treated with PTCA, SVG to LAD was treated with PTCA and DES.  He has been using CPAP machine for his obstructive sleep apnea.  Myoview in January 2022 show evidence of prior inferior scar with minimal peri-infarct ischemia, very mild inferolateral ischemia most likely due to distal small marginal branch stenosis.  He was managed medically.  Patient was last seen by Dr. Loetta Ringer in November 2024 at which time he was restarted CPAP therapy.  More recently, patient presented to the emergency room on 08/16/2023 with 3-day onset of chest discomfort.  Serial troponin was negative x 2.  Creatinine was 2.0 without significant change.  BNP normal.  CTA of the chest showed no PE, known lung mass consolidation, pleural effusion or pneumothorax.  Talking with the patient,  his chest pain essentially has been persistent for the past month, it is slightly worse with deep inspiration.  Given the atypical nature of his symptom and that he has baseline poor renal function, I am hesitant to consider invasive study.  I recommended a PET stress test.  I will also increase his Imdur to 90 mg daily and increase his Ranexa to 1000 mg twice a day.  I plan to see the patient back in 2 months.  Otherwise he appears to be euvolemic on exam.  The T wave inversion that is shown on the lateral side of his EKG has been present for the past 10 years, his recent EKG showed no significant change.  ROS:   He denies palpitations, pnd, orthopnea, n, v, dizziness, syncope, edema, weight gain, or early satiety. All other systems reviewed and are otherwise negative except as noted above.   Patient complains of persistent chest pain for the past month and also shortness of breath  Studies Reviewed: .        Cardiac Studies & Procedures   ______________________________________________________________________________________________   STRESS TESTS  MYOCARDIAL PERFUSION IMAGING 05/03/2020  Narrative  The left ventricular ejection fraction is mildly decreased (45-54%).  Nuclear stress EF: 46%.  There was no ST segment deviation noted during stress.  There is a medium defect of moderate severity present in the basal inferior, basal inferolateral, mid inferior and mid inferolateral location. The defect is partially reversible in  the inferior wall and fully reversible in the inferolateral wall consistent with prior infarct and peri infarct in the basal and mid inferior walls and new ischemia in the inferolateral walls.  This is an intermediate risk study.            ______________________________________________________________________________________________      Risk Assessment/Calculations:             Physical Exam:   VS:  BP 130/68 (BP Location: Left Arm, Patient  Position: Sitting, Cuff Size: Normal)   Pulse (!) 55   Ht 5' 10.5" (1.791 m)   Wt 209 lb (94.8 kg)   SpO2 96%   BMI 29.56 kg/m    Wt Readings from Last 3 Encounters:  08/22/23 209 lb (94.8 kg)  08/16/23 203 lb (92.1 kg)  04/11/23 210 lb (95.3 kg)    GEN: Well nourished, well developed in no acute distress NECK: No JVD; No carotid bruits CARDIAC: RRR, no murmurs, rubs, gallops RESPIRATORY:  Clear to auscultation without rales, wheezing or rhonchi  ABDOMEN: Soft, non-tender, non-distended EXTREMITIES:  No edema; No deformity   ASSESSMENT AND PLAN: .    Chest Pain Persistent atypical chest pain. Negative troponin and normal EKG. PET stress test considered due to complex coronary anatomy and history of interventions. - Order PET stress test at [facility] if available in mid-May; otherwise, proceed with traditional SPECT stress test. - Increase isosorbide mononitrate to 90 mg daily. - Increase ranolazine to 1000 mg twice a day. - Follow up in two months or sooner if stress test results are concerning.  Coronary Artery Disease (CAD) CAD with history of CABG and stenting. Atypical chest pain but CAD remains a concern. PET stress test preferred for detecting ischemia. - Monitor for any changes in symptoms or new ischemic events. - Evaluate stress test results to determine need for further intervention.  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 Risk of renal function worsening with contrast use. - Avoid invasive procedures that may worsen renal function unless absolutely necessary.  Hypertension Hypertension managed with multiple medications. No dizziness reported. Nighttime dosing may cause simultaneous blood pressure drop. - Advise taking amlodipine and hydrochlorothiazide during the daytime.  Hyperlipidemia Hyperlipidemia managed with medications. - Continue current lipid-lowering therapy.     Informed Consent   Shared Decision  Making/Informed Consent The risks [chest pain, shortness of breath, cardiac arrhythmias, dizziness, blood pressure fluctuations, myocardial infarction, stroke/transient ischemic attack, nausea, vomiting, allergic reaction, radiation exposure, metallic taste sensation and life-threatening complications (estimated to be 1 in 10,000)], benefits (risk stratification, diagnosing coronary artery disease, treatment guidance) and alternatives of a cardiac PET stress test were discussed in detail with Mr. Salvetti and he agrees to proceed.     Dispo: follow up in 2 month  Signed, Kaymen Adrian, [place]

## 2023-08-22 NOTE — Patient Instructions (Signed)
 Medication Instructions:  INCREASE ISOSORBIDE MONONITRATE(IMDUR) TO 90 MG DAILY  INCREASE RANOLAZINE (RENEXA) TO 1000 MG TWICE DAILY *If you need a refill on your cardiac medications before your next appointment, please call your pharmacy*  Lab Work: NO LABS If you have labs (blood work) drawn today and your tests are completely normal, you will receive your results only by: MyChart Message (if you have MyChart) OR A paper copy in the mail If you have any lab test that is abnormal or we need to change your treatment, we will call you to review the results.  Testing/Procedures:   Please report to Radiology at [facility] Main Entrance, medical mall, 30 mins prior to your test.  [address]  [place], [place]  How to Prepare for Your Cardiac PET/CT Stress Test:  Nothing to eat or drink, except water, 3 hours prior to arrival time.  NO caffeine/decaffeinated products, or chocolate 12 hours prior to arrival. (Please note decaffeinated beverages (teas/coffees) still contain caffeine).  If you have caffeine within 12 hours prior, the test will need to be rescheduled.  Medication instructions: Do not take erectile dysfunction medications for 72 hours prior to test (sildenafil, tadalafil) Do not take nitrates (isosorbide mononitrate, Ranexa) the day before or day of test Do not take tamsulosin the day before or morning of test Hold theophylline containing medications for 12 hours. Hold Dipyridamole 48 hours prior to the test.  Diabetic Preparation: If able to eat breakfast prior to 3 hour fasting, you may take all medications, including your insulin. Do not worry if you miss your breakfast dose of insulin - start at your next meal. If you do not eat prior to 3 hour fast-Hold all diabetes (oral and insulin) medications. Patients who wear a continuous glucose monitor MUST remove the device prior to scanning.  You may take your remaining medications with  water.  NO perfume, cologne or lotion on chest or abdomen area. FEMALES - Please avoid wearing dresses to this appointment.  Total time is 1 to 2 hours; you may want to bring reading material for the waiting time.  IF YOU THINK YOU MAY BE PREGNANT, OR ARE NURSING PLEASE INFORM THE TECHNOLOGIST.  In preparation for your appointment, medication and supplies will be purchased.  Appointment availability is limited, so if you need to cancel or reschedule, please call the Radiology Department Scheduler at [phone] 24 hours in advance to avoid a cancellation fee of $100.00  What to Expect When you Arrive:  Once you arrive and check in for your appointment, you will be taken to a preparation room within the Radiology Department.  A technologist or Nurse will obtain your medical history, verify that you are correctly prepped for the exam, and explain the procedure.  Afterwards, an IV will be started in your arm and electrodes will be placed on your skin for EKG monitoring during the stress portion of the exam. Then you will be escorted to the PET/CT scanner.  There, staff will get you positioned on the scanner and obtain a blood pressure and EKG.  During the exam, you will continue to be connected to the EKG and blood pressure machines.  A small, safe amount of a radioactive tracer will be injected in your IV to obtain a series of pictures of your heart along with an injection of a stress agent.    After your Exam:  It is recommended that you eat a meal and drink a caffeinated beverage to counter act  any effects of the stress agent.  Drink plenty of fluids for the remainder of the day and urinate frequently for the first couple of hours after the exam.  Your doctor will inform you of your test results within 7-10 business days.  For more information and frequently asked questions, please visit our website: [email or web address]  For questions about your test or how to prepare for your  test, please call: Cardiac Imaging Nurse Navigators Office: [phone]   Follow-Up: At [facility], you and your health needs are our priority.  As part of our continuing mission to provide you with exceptional heart care, our providers are all part of one team.  This team includes your primary Cardiologist (physician) and Advanced Practice Providers or APPs (Physician Assistants and Nurse Practitioners) who all work together to provide you with the care you need, when you need it.  Your next appointment:   2 month(s)  Provider:   Ervin Heath, PA  Other Instructions   1st Floor: - Lobby - Registration  - Pharmacy  - Lab - Cafe  2nd Floor: - PV Lab - Diagnostic Testing (echo, CT, nuclear med)  3rd Floor: - Vacant  4th Floor: - TCTS (cardiothoracic surgery) - AFib Clinic - Structural Heart Clinic - Vascular Surgery  - Vascular Ultrasound  5th Floor: - HeartCare Cardiology (general and EP) - Clinical Pharmacy for coumadin, hypertension, lipid, weight-loss medications, and med management appointments    Valet parking services will be available as well.

## 2023-09-02 ENCOUNTER — Encounter (HOSPITAL_COMMUNITY): Payer: Self-pay

## 2023-09-03 ENCOUNTER — Ambulatory Visit (HOSPITAL_COMMUNITY)
Admission: RE | Admit: 2023-09-03 | Discharge: 2023-09-03 | Disposition: A | Discharge status: Home / Self Care | Source: Ambulatory Visit | Attending: Physician Assistant | Admitting: Physician Assistant

## 2023-09-03 DIAGNOSIS — R079 Chest pain, unspecified: Secondary | ICD-10-CM | POA: Insufficient documentation

## 2023-09-03 LAB — NM PET CT CARDIAC PERFUSION MULTI W/ABSOLUTE BLOODFLOW
LV dias vol: 188 mL (ref 62–150)
LV sys vol: 87 mL
MBFR: 1.62
Nuc Rest EF: 48 %
Nuc Stress EF: 54 %
Rest MBF: 0.79 ml/g/min
Rest Nuclear Isotope Dose: 24.5 mCi
Stress MBF: 1.28 ml/g/min
Stress Nuclear Isotope Dose: 24.5 mCi
TID: 1.14

## 2023-09-03 MED ORDER — RUBIDIUM RB82 GENERATOR (RUBYFILL)
24.5300 | PACK | Freq: Once | INTRAVENOUS | Status: AC
  Administered 2023-09-03: 24.53 via INTRAVENOUS

## 2023-09-03 MED ORDER — RUBIDIUM RB82 GENERATOR (RUBYFILL)
24.4900 | PACK | Freq: Once | INTRAVENOUS | Status: AC
  Administered 2023-09-03: 24.49 via INTRAVENOUS

## 2023-09-03 MED ORDER — REGADENOSON 0.4 MG/5ML IV SOLN
0.4000 mg | Freq: Once | INTRAVENOUS | Status: AC
  Administered 2023-09-03: 0.4 mg via INTRAVENOUS

## 2023-09-03 MED ORDER — REGADENOSON 0.4 MG/5ML IV SOLN
INTRAVENOUS | Status: AC
  Filled 2023-09-03: qty 5

## 2023-09-03 NOTE — Progress Notes (Signed)
 Pt. Tolerated lexi scan well.

## 2023-09-10 ENCOUNTER — Telehealth: Payer: Self-pay

## 2023-09-10 ENCOUNTER — Other Ambulatory Visit: Payer: Self-pay

## 2023-09-10 ENCOUNTER — Ambulatory Visit: Attending: Cardiovascular Disease | Admitting: Cardiovascular Disease

## 2023-09-10 ENCOUNTER — Encounter: Payer: Self-pay | Admitting: Cardiovascular Disease

## 2023-09-10 DIAGNOSIS — G4733 Obstructive sleep apnea (adult) (pediatric): Secondary | ICD-10-CM | POA: Diagnosis not present

## 2023-09-10 DIAGNOSIS — E782 Mixed hyperlipidemia: Secondary | ICD-10-CM

## 2023-09-10 DIAGNOSIS — Z951 Presence of aortocoronary bypass graft: Secondary | ICD-10-CM | POA: Diagnosis not present

## 2023-09-10 DIAGNOSIS — I10 Essential (primary) hypertension: Secondary | ICD-10-CM

## 2023-09-10 DIAGNOSIS — N1832 Chronic kidney disease, stage 3b: Secondary | ICD-10-CM

## 2023-09-10 DIAGNOSIS — I440 Atrioventricular block, first degree: Secondary | ICD-10-CM

## 2023-09-10 DIAGNOSIS — I25708 Atherosclerosis of coronary artery bypass graft(s), unspecified, with other forms of angina pectoris: Secondary | ICD-10-CM

## 2023-09-10 LAB — CBC
Hematocrit: 44.1 % (ref 37.5–51.0)
Hemoglobin: 14.2 g/dL (ref 13.0–17.7)
MCH: 29.7 pg (ref 26.6–33.0)
MCHC: 32.2 g/dL (ref 31.5–35.7)
MCV: 92 fL (ref 79–97)
Platelets: 199 10*3/uL (ref 150–450)
RBC: 4.78 x10E6/uL (ref 4.14–5.80)
RDW: 11.9 % (ref 11.6–15.4)
WBC: 8.1 10*3/uL (ref 3.4–10.8)

## 2023-09-10 MED ORDER — ISOSORBIDE MONONITRATE ER 120 MG PO TB24: 120.0000 mg | ORAL_TABLET | Freq: Every day | ORAL | 3 refills | Status: DC

## 2023-09-10 MED ORDER — AMLODIPINE BESYLATE 5 MG PO TABS: 7.5000 mg | ORAL_TABLET | Freq: Every day | ORAL | 3 refills | Status: DC

## 2023-09-10 NOTE — Patient Instructions (Signed)
 Medication Instructions:  DISCONTINUED VALSARTAN   INCREASE IMDUR  TO 120 MG DAILY  INCREASE AMLODIPINE  TO 7.5 MG DAILY *If you need a refill on your cardiac medications before your next appointment, please call your pharmacy*  Lab Work: CMP,CBC,FASTING LIPID PANEL AND Lpa TODAY If you have labs (blood work) drawn today and your tests are completely normal, you will receive your results only by: MyChart Message (if you have MyChart) OR A paper copy in the mail If you have any lab test that is abnormal or we need to change your treatment, we will call you to review the results.  Testing/Procedures: NO TESTING

## 2023-09-10 NOTE — Telephone Encounter (Signed)
 Sasha called for orders to be released

## 2023-09-11 ENCOUNTER — Encounter: Payer: Self-pay | Admitting: Cardiovascular Disease

## 2023-09-11 LAB — COMPREHENSIVE METABOLIC PANEL WITH GFR
ALT: 19 IU/L (ref 0–44)
AST: 24 IU/L (ref 0–40)
Albumin: 4.2 g/dL (ref 3.8–4.8)
Alkaline Phosphatase: 85 IU/L (ref 44–121)
BUN/Creatinine Ratio: 10 (ref 10–24)
BUN: 18 mg/dL (ref 8–27)
Bilirubin Total: 0.6 mg/dL (ref 0.0–1.2)
CO2: 24 mmol/L (ref 20–29)
Calcium: 10.3 mg/dL — ABNORMAL HIGH (ref 8.6–10.2)
Chloride: 104 mmol/L (ref 96–106)
Creatinine, Ser: 1.81 mg/dL — ABNORMAL HIGH (ref 0.76–1.27)
Globulin, Total: 2.6 g/dL (ref 1.5–4.5)
Glucose: 106 mg/dL — ABNORMAL HIGH (ref 70–99)
Potassium: 5.7 mmol/L — ABNORMAL HIGH (ref 3.5–5.2)
Sodium: 140 mmol/L (ref 134–144)
Total Protein: 6.8 g/dL (ref 6.0–8.5)
eGFR: 38 mL/min/{1.73_m2} — ABNORMAL LOW (ref >59)

## 2023-09-11 LAB — LIPID PANEL
Chol/HDL Ratio: 2.9 ratio (ref 0.0–5.0)
Cholesterol, Total: 126 mg/dL (ref 100–199)
HDL: 43 mg/dL (ref >39)
LDL Chol Calc (NIH): 61 mg/dL (ref 0–99)
Triglycerides: 121 mg/dL (ref 0–149)
VLDL Cholesterol Cal: 22 mg/dL (ref 5–40)

## 2023-09-11 LAB — LIPOPROTEIN A (LPA): Lipoprotein (a): 222.2 nmol/L — ABNORMAL HIGH (ref <75.0)

## 2023-09-11 NOTE — H&P (View-Only) (Signed)
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 264 ms.  Previously noted T-wave changes inferolaterally.  July 2016 ECG (independently read by me): Normal sinus rhythm at 64 bpm.  Nondiagnostic T-wave changes inferolaterally.  First-degree AV block with a PR interval at 238 ms.  January 2016 ECG (independently read by me): Normal sinus rhythm at 60 bpm.  First-degree AV block with a PR interval at 236 ms.  Nonspecific ST changes  December 2014 ECG: Sinus rhythm at 61 beats per minute. Previously old Q wave in 3. Nondiagnostic ST changes   LABS:    Latest Ref Rng & Units 09/10/2023    1:24 PM 08/16/2023   12:30 PM 04/02/2023   10:15 AM  BMP  Glucose 70 - 99 mg/dL 161  096  96   BUN 8 - 27 mg/dL 18  21  19    Creatinine 0.76 - 1.27 mg/dL 0.45  4.09  8.11   BUN/Creat Ratio 10 - 24 10   10    Sodium 134 - 144 mmol/L 140  137  140   Potassium 3.5 - 5.2 mmol/L 5.7  4.6  5.2   Chloride 96 - 106 mmol/L 104  104  103   CO2 20 - 29 mmol/L 24  26  25    Calcium  8.6 - 10.2 mg/dL 91.4  9.7  78.2       Latest Ref Rng & Units 09/10/2023    1:24 PM 08/16/2023   12:30 PM 04/02/2023   10:15 AM  Hepatic Function  Total Protein 6.0 - 8.5 g/dL 6.8  6.5  6.6   Albumin 3.8 - 4.8 g/dL 4.2  3.4  4.1   AST 0 - 40 IU/L 24   24  32   ALT 0 - 44 IU/L 19  19  25    Alk Phosphatase 44 - 121 IU/L 85  66  85   Total Bilirubin 0.0 - 1.2 mg/dL 0.6  0.6  0.5   Bilirubin, Direct 0.0 - 0.2 mg/dL  0.1        Latest Ref Rng & Units 09/10/2023    1:24 PM 08/16/2023   12:30 PM 04/02/2023   10:15 AM  CBC  WBC 3.4 - 10.8 x10E3/uL 8.1  6.9  6.9   Hemoglobin 13.0 - 17.7 g/dL 95.6  21.3  08.6   Hematocrit 37.5 - 51.0 % 44.1  41.9  46.3   Platelets 150 - 450 x10E3/uL 199  201  202    Lab Results  Component Value Date   MCV 92 09/10/2023   MCV 91.5 08/16/2023   MCV 96 04/02/2023   Lab Results  Component Value Date   TSH 1.550 04/02/2023   Lab Results  Component Value Date   HGBA1C 7.0 (H) 04/28/2020   Lipid Panel     Component Value Date/Time   CHOL 126 09/10/2023 1439   CHOL 137 11/22/2014 1117   TRIG 121 09/10/2023 1439   TRIG 160 (H) 11/22/2014 1117   TRIG 216 (HH) 03/04/2006 0946   HDL 43 09/10/2023 1439   HDL 43 11/22/2014 1117   CHOLHDL 2.9 09/10/2023 1439   CHOLHDL 3.4 08/17/2015 1158   VLDL 32 (H) 08/17/2015 1158   LDLCALC 61 09/10/2023 1439   LDLCALC 62 11/22/2014 1117   LDLDIRECT 66.0 01/09/2018 1229     RADIOLOGY: No results found.  IMPRESSION:   1. 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 Hx of CABG   3. Essential hypertension   4. OSA on CPAP   5. Mixed hyperlipidemia   6. First degree AV block   7. 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 He is status post complex rotational atherectomy and diffuse stenting of his native circumflex vessel which also supplied his RCA territory. He developed unstable anginal symptoms in October 2014 and had mild positive troponin  ruling in for non-ST segment MI. Catheterization in 02/04/2013 showed focal 95% in-stent restenosis in the distal third of the previously placed stent in the circumflex vessel. This stenosis occurred after the collateral  of the circumflex supplying the distal right coronary artery. He also had high grade 85-90% near ostial stenosis in the vein graft supplying the LAD. Fortunately he underwent successful two-vessel coronary intervention with cutting balloon angiosculpt to the circumflex in-stent restenosis, and PTCA/insertion of a new DES stent in the ostium of the vein graft supplying the LAD.  When I last saw him 3 years ago in December 2018 he continued to be stable from an anginal standpoint and denied any recurrent chest pain symptomatology on isosorbide  60 mg, metoprolol  which he was only taking 25 mg daily of the tartrate preparation in addition to Ranexa  and losartan .  At that time his blood pressure was elevated and medication adjustment was made.  When I saw him in December 2021 after not having seen him in 3 years, his blood pressure was elevated and there was  mild T wave abnormalities.  Blood pressure medications were increased.  A Lexiscan  Myoview  study showed old prior scar with minimal peri-infarction ischemia in the basal to mid inferior wall and inferolaterally.  His blood pressure had improved but today blood pressure continues to be elevated on a regimen consisting of metoprolol  tartrate 25 mg in the morning and 12.5 mg at night, isosorbide  60 mg daily, and valsartan  80 mg.  He has a history of renal insufficiency and when he was evaluated in the emergency room and tested positive for COVID on Sep 29, 2020 his creatinine had increased to 1.96.  Most recent laboratory from August 16, 2022 showed stable class IIIb  CKD with creatinine 1.98.  Lipid studies were excellent with total cholesterol 117, triglyceride 123, LDL cholesterol 56 with HDL 39 and VLDL 22.  LFTs are normal.  He has been maintained  on multi medical anginal blood pressure medical regimen consisting of amlodipine  5 mg, isosorbide  60 mg, metoprolol  tartrate 25 mg in the morning and 12.5 mg in the evening, ranolazine  500 mg twice a day and he has been on low-dose valsartan  80 mg without worsening renal function.  At his last evaluation with me in November 2024 he remained asymptomatic.  In April he noticed an episode of chest squeezing while he was cutting his grass which mimicked his previous exertional anginal symptomatology.  An ER evaluation revealed normal troponins.  Creatinine 2.0 without major change from previously.  BNP was normal.  CT of his chest did not show PE or lung mass.  A subsequent nuclear medicine PET CT cardiac perfusion study was performed on August 22, 2023 which revealed severe reversible defect in the mid to basal inferior/inferolateral wall consistent with ischemia.  He had normal wall motion in the defect area and with reversibility to his defect.  I reviewed his study in detail with him today.  I have recommended definitive cardiac catheterization.  However, creatinine on recently checked was 2.0.  I will recheck laboratory today consisting of a comprehensive metabolic panel, CBC, lipid panel, will also check LP(a).  I have recommended he at present discontinue valsartan .  I will further titrate isosorbide  to 120 mg and with his hypertensive history and with discontinuance of valsartan  I will slightly titrate amlodipine  to 7.5 mg.  I discussed the risk benefits of definitive catheterization.  I have recommended that his procedure be done by Dr. Michael Cooper.  Timing will be determined by his renal function and he may require repeat chemistry profile in 4 to 5 days following valsartan  discontinuation.  Will tentatively plan for this catheterization to procedure to be done later next week depending upon laboratory results.  He is aware of my upcoming retirement and it has been an honor to take care of him for these many  years.    Millicent Ally, MD, [facility]  09/11/2023 8:17 AM

## 2023-09-11 NOTE — Progress Notes (Signed)
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 264 ms.  Previously noted T-wave changes inferolaterally.  July 2016 ECG (independently read by me): Normal sinus rhythm at 64 bpm.  Nondiagnostic T-wave changes inferolaterally.  First-degree AV block with a PR interval at 238 ms.  January 2016 ECG (independently read by me): Normal sinus rhythm at 60 bpm.  First-degree AV block with a PR interval at 236 ms.  Nonspecific ST changes  December 2014 ECG: Sinus rhythm at 61 beats per minute. Previously old Q wave in 3. Nondiagnostic ST changes   LABS:    Latest Ref Rng & Units 09/10/2023    1:24 PM 08/16/2023   12:30 PM 04/02/2023   10:15 AM  BMP  Glucose 70 - 99 mg/dL 161  096  96   BUN 8 - 27 mg/dL 18  21  19    Creatinine 0.76 - 1.27 mg/dL 0.45  4.09  8.11   BUN/Creat Ratio 10 - 24 10   10    Sodium 134 - 144 mmol/L 140  137  140   Potassium 3.5 - 5.2 mmol/L 5.7  4.6  5.2   Chloride 96 - 106 mmol/L 104  104  103   CO2 20 - 29 mmol/L 24  26  25    Calcium  8.6 - 10.2 mg/dL 91.4  9.7  78.2       Latest Ref Rng & Units 09/10/2023    1:24 PM 08/16/2023   12:30 PM 04/02/2023   10:15 AM  Hepatic Function  Total Protein 6.0 - 8.5 g/dL 6.8  6.5  6.6   Albumin 3.8 - 4.8 g/dL 4.2  3.4  4.1   AST 0 - 40 IU/L 24   24  32   ALT 0 - 44 IU/L 19  19  25    Alk Phosphatase 44 - 121 IU/L 85  66  85   Total Bilirubin 0.0 - 1.2 mg/dL 0.6  0.6  0.5   Bilirubin, Direct 0.0 - 0.2 mg/dL  0.1        Latest Ref Rng & Units 09/10/2023    1:24 PM 08/16/2023   12:30 PM 04/02/2023   10:15 AM  CBC  WBC 3.4 - 10.8 x10E3/uL 8.1  6.9  6.9   Hemoglobin 13.0 - 17.7 g/dL 95.6  21.3  08.6   Hematocrit 37.5 - 51.0 % 44.1  41.9  46.3   Platelets 150 - 450 x10E3/uL 199  201  202    Lab Results  Component Value Date   MCV 92 09/10/2023   MCV 91.5 08/16/2023   MCV 96 04/02/2023   Lab Results  Component Value Date   TSH 1.550 04/02/2023   Lab Results  Component Value Date   HGBA1C 7.0 (H) 04/28/2020   Lipid Panel     Component Value Date/Time   CHOL 126 09/10/2023 1439   CHOL 137 11/22/2014 1117   TRIG 121 09/10/2023 1439   TRIG 160 (H) 11/22/2014 1117   TRIG 216 (HH) 03/04/2006 0946   HDL 43 09/10/2023 1439   HDL 43 11/22/2014 1117   CHOLHDL 2.9 09/10/2023 1439   CHOLHDL 3.4 08/17/2015 1158   VLDL 32 (H) 08/17/2015 1158   LDLCALC 61 09/10/2023 1439   LDLCALC 62 11/22/2014 1117   LDLDIRECT 66.0 01/09/2018 1229     RADIOLOGY: No results found.  IMPRESSION:   1. 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 Hx of CABG   3. Essential hypertension   4. OSA on CPAP   5. Mixed hyperlipidemia   6. First degree AV block   7. Stage 3b chronic kidney disease (HCC)     ASSESSMENT AND PLAN: Joshua Hahn is a 78 year-old African-American male who is over 36 years following his initial PTCA of his distal RCA and 30 years following his inferior wall myocardial infarction and subsequent CABG revascularization surgery. He is status post complex rotational atherectomy and diffuse stenting of his native circumflex vessel which also supplied his RCA territory. He developed unstable anginal symptoms in October 2014 and had mild positive troponin  ruling in for non-ST segment MI. Catheterization in 02/04/2013 showed focal 95% in-stent restenosis in the distal third of the previously placed stent in the circumflex vessel. This stenosis occurred after the collateral  of the circumflex supplying the distal right coronary artery. He also had high grade 85-90% near ostial stenosis in the vein graft supplying the LAD. Fortunately he underwent successful two-vessel coronary intervention with cutting balloon angiosculpt to the circumflex in-stent restenosis, and PTCA/insertion of a new DES stent in the ostium of the vein graft supplying the LAD.  When I last saw him 3 years ago in December 2018 he continued to be stable from an anginal standpoint and denied any recurrent chest pain symptomatology on isosorbide  60 mg, metoprolol  which he was only taking 25 mg daily of the tartrate preparation in addition to Ranexa  and losartan .  At that time his blood pressure was elevated and medication adjustment was made.  When I saw him in December 2021 after not having seen him in 3 years, his blood pressure was elevated and there was  mild T wave abnormalities.  Blood pressure medications were increased.  A Lexiscan  Myoview  study showed old prior scar with minimal peri-infarction ischemia in the basal to mid inferior wall and inferolaterally.  His blood pressure had improved but today blood pressure continues to be elevated on a regimen consisting of metoprolol  tartrate 25 mg in the morning and 12.5 mg at night, isosorbide  60 mg daily, and valsartan  80 mg.  He has a history of renal insufficiency and when he was evaluated in the emergency room and tested positive for COVID on Sep 29, 2020 his creatinine had increased to 1.96.  Most recent laboratory from August 16, 2022 showed stable class IIIb  CKD with creatinine 1.98.  Lipid studies were excellent with total cholesterol 117, triglyceride 123, LDL cholesterol 56 with HDL 39 and VLDL 22.  LFTs are normal.  He has been maintained  on multi medical anginal blood pressure medical regimen consisting of amlodipine  5 mg, isosorbide  60 mg, metoprolol  tartrate 25 mg in the morning and 12.5 mg in the evening, ranolazine  500 mg twice a day and he has been on low-dose valsartan  80 mg without worsening renal function.  At his last evaluation with me in November 2024 he remained asymptomatic.  In April he noticed an episode of chest squeezing while he was cutting his grass which mimicked his previous exertional anginal symptomatology.  An ER evaluation revealed normal troponins.  Creatinine 2.0 without major change from previously.  BNP was normal.  CT of his chest did not show PE or lung mass.  A subsequent nuclear medicine PET CT cardiac perfusion study was performed on August 22, 2023 which revealed severe reversible defect in the mid to basal inferior/inferolateral wall consistent with ischemia.  He had normal wall motion in the defect area and with reversibility to his defect.  I reviewed his study in detail with him today.  I have recommended definitive cardiac catheterization.  However, creatinine on recently checked was 2.0.  I will recheck laboratory today consisting of a comprehensive metabolic panel, CBC, lipid panel, will also check LP(a).  I have recommended he at present discontinue valsartan .  I will further titrate isosorbide  to 120 mg and with his hypertensive history and with discontinuance of valsartan  I will slightly titrate amlodipine  to 7.5 mg.  I discussed the risk benefits of definitive catheterization.  I have recommended that his procedure be done by Dr. Michael Cooper.  Timing will be determined by his renal function and he may require repeat chemistry profile in 4 to 5 days following valsartan  discontinuation.  Will tentatively plan for this catheterization to procedure to be done later next week depending upon laboratory results.  He is aware of my upcoming retirement and it has been an honor to take care of him for these many  years.    Millicent Ally, MD, [facility]  09/11/2023 8:17 AM

## 2023-09-12 ENCOUNTER — Telehealth: Payer: Self-pay

## 2023-09-12 ENCOUNTER — Telehealth: Payer: Self-pay | Admitting: *Deleted

## 2023-09-12 ENCOUNTER — Other Ambulatory Visit: Payer: Self-pay | Admitting: Cardiovascular Disease

## 2023-09-12 DIAGNOSIS — Z951 Presence of aortocoronary bypass graft: Secondary | ICD-10-CM

## 2023-09-12 DIAGNOSIS — R931 Abnormal findings on diagnostic imaging of heart and coronary circulation: Secondary | ICD-10-CM

## 2023-09-12 DIAGNOSIS — I10 Essential (primary) hypertension: Secondary | ICD-10-CM

## 2023-09-12 NOTE — Telephone Encounter (Signed)
 See 09/10/23 CMP results-per Dr Carolyne Citizen hydration prior to cath To accommodate hydration prior to procedure arrival time now 8:30 AM/procedure time 1:30 PM Patient aware of time change.  Cardiac Catheterization scheduled at [facility] for: Friday Sep 20, 2023 1 :30 PM Arrival time [facility] Main Entrance A at: 8:30 AM-pre-procedure hydration  Nothing to eat after midnight prior to procedure, clear liquids until 5 AM day of procedure.  Medication instructions: -Hold:  Hydrochlorothiazide -day before and day of procedure - per protocol GFR <60-pt reports he takes prn -Other usual morning medications can be taken with sips of water including aspirin  81 mg and Plavix  75 mg.  Plan to go home the same day, you will only stay overnight if medically necessary.  You must have responsible adult to drive you home.  Someone must be with you the first 24 hours after you arrive home.

## 2023-09-12 NOTE — Telephone Encounter (Signed)
-----   Message from Magnus Schuller sent at 09/11/2023  3:36 PM EDT ----- Laboratory reveals creatinine slightly improved now at 1.81 compared to 2.0.  Potassium is elevated at 5.7.  Calcium  is borderline increased at 10.3.  LFTs are normal.  Lipid studies are excellent with total cholesterol 126 triglycerides 121 LDL cholesterol 61.  CBC is stable with hemoglobin 14.2 hematocrit 44.1.  When evaluated yesterday in the office, in anticipation of his need for cardiac catheterization, I discontinued valsartan  and adjusted amlodipine  and Imdur .  Must avoid potassium containing foods.   Recommend a follow-up Bmet this Friday.  If potassium is still elevated will need Lokelma.  Will tentatively schedule patient for cardiac catheterization with Dr. Michael Cooper for next Friday but will need hydration prior to procedure. LP(a) is pending.

## 2023-09-12 NOTE — Telephone Encounter (Signed)
 Spoke with pt regarding recent lab work. Pt will continue with care plan as discussed with Dr. Loetta Ringer on Tuesday (5/6)- holding valsartan  and taking adjusted dosage of amlodipine  and imdur . Explained elevated potassium levels with pt. He will avoid potassium containing food/drink and will have repeat labs drawn tomorrow at our office. Orders placed. Dr. Loetta Ringer would like to also schedule pt to have a heart cath next week on Friday with Dr. Arlester Ladd.  Explained procedure and instructions with pt. Instructions sent via mychart. Will call pt back with exact time of procedure.  Called pt back to let him know that heart cath will be on Friday, May 16th at 7:30am with an arrival time of 5:30am.  Pt verbalizes understanding and has no further questions at this time.

## 2023-09-13 DIAGNOSIS — I10 Essential (primary) hypertension: Secondary | ICD-10-CM | POA: Diagnosis not present

## 2023-09-13 DIAGNOSIS — Z951 Presence of aortocoronary bypass graft: Secondary | ICD-10-CM | POA: Diagnosis not present

## 2023-09-14 LAB — BASIC METABOLIC PANEL WITH GFR
BUN/Creatinine Ratio: 9 — ABNORMAL LOW (ref 10–24)
BUN: 18 mg/dL (ref 8–27)
CO2: 22 mmol/L (ref 20–29)
Calcium: 9.9 mg/dL (ref 8.6–10.2)
Chloride: 103 mmol/L (ref 96–106)
Creatinine, Ser: 1.93 mg/dL — ABNORMAL HIGH (ref 0.76–1.27)
Glucose: 118 mg/dL — ABNORMAL HIGH (ref 70–99)
Potassium: 4.9 mmol/L (ref 3.5–5.2)
Sodium: 139 mmol/L (ref 134–144)
eGFR: 35 mL/min/{1.73_m2} — ABNORMAL LOW (ref >59)

## 2023-09-16 ENCOUNTER — Telehealth: Payer: Self-pay | Admitting: Family Medicine

## 2023-09-16 NOTE — Telephone Encounter (Signed)
 LVM to schedule an appt with Hunter in order for pt to keep him as his primary provider. Has not been seen since 2019.

## 2023-09-17 ENCOUNTER — Ambulatory Visit: Payer: Self-pay | Admitting: *Deleted

## 2023-09-18 NOTE — Telephone Encounter (Signed)
 Reviewed procedure instructions /pre-procedure hydration with patient.

## 2023-09-19 ENCOUNTER — Ambulatory Visit: Payer: Self-pay

## 2023-09-19 ENCOUNTER — Other Ambulatory Visit

## 2023-09-20 ENCOUNTER — Ambulatory Visit (HOSPITAL_COMMUNITY)
Admission: RE | Disposition: A | Discharge status: Home / Self Care | Payer: Self-pay | Source: Home / Self Care | Attending: Cardiovascular Disease

## 2023-09-20 ENCOUNTER — Other Ambulatory Visit: Payer: Self-pay

## 2023-09-20 ENCOUNTER — Ambulatory Visit (HOSPITAL_COMMUNITY)
Admission: RE | Admit: 2023-09-20 | Discharge: 2023-09-21 | Disposition: A | Discharge status: Home / Self Care | Attending: Cardiovascular Disease | Admitting: Cardiovascular Disease

## 2023-09-20 DIAGNOSIS — I25810 Atherosclerosis of coronary artery bypass graft(s) without angina pectoris: Secondary | ICD-10-CM | POA: Diagnosis not present

## 2023-09-20 DIAGNOSIS — Z7902 Long term (current) use of antithrombotics/antiplatelets: Secondary | ICD-10-CM | POA: Insufficient documentation

## 2023-09-20 DIAGNOSIS — R931 Abnormal findings on diagnostic imaging of heart and coronary circulation: Secondary | ICD-10-CM

## 2023-09-20 DIAGNOSIS — E782 Mixed hyperlipidemia: Secondary | ICD-10-CM | POA: Diagnosis not present

## 2023-09-20 DIAGNOSIS — Z955 Presence of coronary angioplasty implant and graft: Secondary | ICD-10-CM | POA: Insufficient documentation

## 2023-09-20 DIAGNOSIS — Z7982 Long term (current) use of aspirin: Secondary | ICD-10-CM | POA: Diagnosis not present

## 2023-09-20 DIAGNOSIS — I440 Atrioventricular block, first degree: Secondary | ICD-10-CM | POA: Insufficient documentation

## 2023-09-20 DIAGNOSIS — I129 Hypertensive chronic kidney disease with stage 1 through stage 4 chronic kidney disease, or unspecified chronic kidney disease: Secondary | ICD-10-CM | POA: Diagnosis not present

## 2023-09-20 DIAGNOSIS — Z9079 Acquired absence of other genital organ(s): Secondary | ICD-10-CM | POA: Diagnosis not present

## 2023-09-20 DIAGNOSIS — G4733 Obstructive sleep apnea (adult) (pediatric): Secondary | ICD-10-CM | POA: Insufficient documentation

## 2023-09-20 DIAGNOSIS — I252 Old myocardial infarction: Secondary | ICD-10-CM | POA: Insufficient documentation

## 2023-09-20 DIAGNOSIS — I25119 Atherosclerotic heart disease of native coronary artery with unspecified angina pectoris: Secondary | ICD-10-CM

## 2023-09-20 DIAGNOSIS — Z79899 Other long term (current) drug therapy: Secondary | ICD-10-CM | POA: Diagnosis not present

## 2023-09-20 DIAGNOSIS — I2089 Other forms of angina pectoris: Secondary | ICD-10-CM | POA: Diagnosis present

## 2023-09-20 DIAGNOSIS — N1832 Chronic kidney disease, stage 3b: Secondary | ICD-10-CM | POA: Insufficient documentation

## 2023-09-20 DIAGNOSIS — I2582 Chronic total occlusion of coronary artery: Secondary | ICD-10-CM | POA: Diagnosis not present

## 2023-09-20 DIAGNOSIS — I10 Essential (primary) hypertension: Secondary | ICD-10-CM

## 2023-09-20 DIAGNOSIS — Z951 Presence of aortocoronary bypass graft: Secondary | ICD-10-CM

## 2023-09-20 HISTORY — PX: CORONARY STENT INTERVENTION: CATH118234

## 2023-09-20 HISTORY — PX: LEFT HEART CATH AND CORS/GRAFTS ANGIOGRAPHY: CATH118250

## 2023-09-20 LAB — POCT ACTIVATED CLOTTING TIME
Activated Clotting Time: 233 s
Activated Clotting Time: 279 s

## 2023-09-20 MED ORDER — MIDAZOLAM HCL 2 MG/2ML IJ SOLN
INTRAMUSCULAR | Status: DC | PRN
Start: 2023-09-20 — End: 2023-09-20
  Administered 2023-09-20: 1 mg via INTRAVENOUS
  Administered 2023-09-20: 2 mg via INTRAVENOUS

## 2023-09-20 MED ORDER — SODIUM CHLORIDE 0.9 % WEIGHT BASED INFUSION: 1.0000 mL/kg/h | INTRAVENOUS | Status: DC

## 2023-09-20 MED ORDER — SODIUM CHLORIDE 0.9% FLUSH
3.0000 mL | Freq: Two times a day (BID) | INTRAVENOUS | Status: DC
  Administered 2023-09-21: 3 mL via INTRAVENOUS

## 2023-09-20 MED ORDER — MIDAZOLAM HCL 2 MG/2ML IJ SOLN
INTRAMUSCULAR | Status: AC
  Filled 2023-09-20: qty 2

## 2023-09-20 MED ORDER — IOHEXOL 350 MG/ML SOLN
INTRAVENOUS | Status: DC | PRN
  Administered 2023-09-20: 65 mL

## 2023-09-20 MED ORDER — SODIUM CHLORIDE 0.9 % IV SOLN: 250.0000 mL | INTRAVENOUS | Status: DC | PRN

## 2023-09-20 MED ORDER — NITROGLYCERIN 0.4 MG SL SUBL: 0.4000 mg | SUBLINGUAL_TABLET | SUBLINGUAL | Status: DC | PRN

## 2023-09-20 MED ORDER — METOPROLOL TARTRATE 12.5 MG HALF TABLET
12.5000 mg | ORAL_TABLET | Freq: Two times a day (BID) | ORAL | Status: DC
  Administered 2023-09-20: 12.5 mg via ORAL
  Filled 2023-09-20 (×2): qty 1

## 2023-09-20 MED ORDER — ISOSORBIDE MONONITRATE ER 60 MG PO TB24
120.0000 mg | ORAL_TABLET | Freq: Every day | ORAL | Status: DC
  Administered 2023-09-20 – 2023-09-21 (×2): 120 mg via ORAL
  Filled 2023-09-20 (×2): qty 2

## 2023-09-20 MED ORDER — NITROGLYCERIN 1 MG/10 ML FOR IR/CATH LAB
INTRA_ARTERIAL | Status: AC
  Filled 2023-09-20: qty 10

## 2023-09-20 MED ORDER — EZETIMIBE 10 MG PO TABS
10.0000 mg | ORAL_TABLET | Freq: Every day | ORAL | Status: DC
  Administered 2023-09-20 – 2023-09-21 (×2): 10 mg via ORAL
  Filled 2023-09-20 (×2): qty 1

## 2023-09-20 MED ORDER — SODIUM CHLORIDE 0.9 % WEIGHT BASED INFUSION
3.0000 mL/kg/h | INTRAVENOUS | Status: DC
  Administered 2023-09-20: 3 mL/kg/h via INTRAVENOUS

## 2023-09-20 MED ORDER — LIDOCAINE HCL (PF) 1 % IJ SOLN
INTRAMUSCULAR | Status: DC | PRN
  Administered 2023-09-20: 2 mL

## 2023-09-20 MED ORDER — HEPARIN SODIUM (PORCINE) 1000 UNIT/ML IJ SOLN
INTRAMUSCULAR | Status: AC
  Filled 2023-09-20: qty 10

## 2023-09-20 MED ORDER — ONDANSETRON HCL 4 MG/2ML IJ SOLN
4.0000 mg | Freq: Four times a day (QID) | INTRAMUSCULAR | Status: DC | PRN
Start: 2023-09-20 — End: 2023-09-21

## 2023-09-20 MED ORDER — LABETALOL HCL 5 MG/ML IV SOLN: 10.0000 mg | INTRAVENOUS | Status: AC | PRN

## 2023-09-20 MED ORDER — SODIUM CHLORIDE 0.9% FLUSH: 3.0000 mL | INTRAVENOUS | Status: DC | PRN

## 2023-09-20 MED ORDER — FENTANYL CITRATE (PF) 100 MCG/2ML IJ SOLN
INTRAMUSCULAR | Status: AC
  Filled 2023-09-20: qty 2

## 2023-09-20 MED ORDER — HEPARIN (PORCINE) IN NACL 1000-0.9 UT/500ML-% IV SOLN
INTRAVENOUS | Status: DC | PRN
  Administered 2023-09-20: 1000 mL

## 2023-09-20 MED ORDER — ACETAMINOPHEN 325 MG PO TABS: 650.0000 mg | ORAL_TABLET | ORAL | Status: DC | PRN

## 2023-09-20 MED ORDER — SODIUM CHLORIDE 0.9 % WEIGHT BASED INFUSION: 1.0000 mL/kg/h | INTRAVENOUS | Status: AC

## 2023-09-20 MED ORDER — ROSUVASTATIN CALCIUM 20 MG PO TABS
40.0000 mg | ORAL_TABLET | Freq: Every day | ORAL | Status: DC
  Administered 2023-09-20 – 2023-09-21 (×2): 40 mg via ORAL
  Filled 2023-09-20 (×2): qty 2

## 2023-09-20 MED ORDER — LIDOCAINE HCL (PF) 1 % IJ SOLN
INTRAMUSCULAR | Status: AC
  Filled 2023-09-20: qty 30

## 2023-09-20 MED ORDER — FENTANYL CITRATE (PF) 100 MCG/2ML IJ SOLN
INTRAMUSCULAR | Status: DC | PRN
  Administered 2023-09-20 (×2): 25 ug via INTRAVENOUS

## 2023-09-20 MED ORDER — VERAPAMIL HCL 2.5 MG/ML IV SOLN
INTRAVENOUS | Status: DC | PRN
  Administered 2023-09-20 (×2): 10 mL via INTRA_ARTERIAL

## 2023-09-20 MED ORDER — RANOLAZINE ER 500 MG PO TB12
1000.0000 mg | ORAL_TABLET | Freq: Two times a day (BID) | ORAL | Status: DC
  Administered 2023-09-20 – 2023-09-21 (×2): 1000 mg via ORAL
  Filled 2023-09-20 (×2): qty 2

## 2023-09-20 MED ORDER — VERAPAMIL HCL 2.5 MG/ML IV SOLN
INTRAVENOUS | Status: AC
  Filled 2023-09-20: qty 2

## 2023-09-20 MED ORDER — ASPIRIN 81 MG PO CHEW
81.0000 mg | CHEWABLE_TABLET | Freq: Every day | ORAL | Status: DC
  Administered 2023-09-21: 81 mg via ORAL
  Filled 2023-09-20: qty 1

## 2023-09-20 MED ORDER — HYDRALAZINE HCL 20 MG/ML IJ SOLN: 10.0000 mg | INTRAMUSCULAR | Status: AC | PRN

## 2023-09-20 MED ORDER — CLOPIDOGREL BISULFATE 75 MG PO TABS
75.0000 mg | ORAL_TABLET | Freq: Every day | ORAL | Status: DC
  Administered 2023-09-21: 75 mg via ORAL
  Filled 2023-09-20: qty 1

## 2023-09-20 MED ORDER — AMLODIPINE BESYLATE 5 MG PO TABS
7.5000 mg | ORAL_TABLET | Freq: Every day | ORAL | Status: DC
  Administered 2023-09-20 – 2023-09-21 (×2): 7.5 mg via ORAL
  Filled 2023-09-20 (×2): qty 1

## 2023-09-20 MED ORDER — HEPARIN SODIUM (PORCINE) 1000 UNIT/ML IJ SOLN
INTRAMUSCULAR | Status: DC | PRN
  Administered 2023-09-20 (×3): 5000 [IU] via INTRAVENOUS

## 2023-09-20 NOTE — Interval H&P Note (Signed)
 History and Physical Interval Note:  09/20/2023 3:20 PM  Joshua Hahn  has presented today for surgery, with the diagnosis of abnormal stress test.  The various methods of treatment have been discussed with the patient and family. After consideration of risks, benefits and other options for treatment, the patient has consented to  Procedure(s): LEFT HEART CATH AND CORS/GRAFTS ANGIOGRAPHY (N/A) as a surgical intervention.  The patient's history has been reviewed, patient examined, no change in status, stable for surgery.  I have reviewed the patient's chart and labs.  Questions were answered to the patient's satisfaction.     Arnoldo Lapping

## 2023-09-21 ENCOUNTER — Other Ambulatory Visit (HOSPITAL_COMMUNITY): Payer: Self-pay

## 2023-09-21 DIAGNOSIS — Z955 Presence of coronary angioplasty implant and graft: Secondary | ICD-10-CM | POA: Diagnosis not present

## 2023-09-21 DIAGNOSIS — R9439 Abnormal result of other cardiovascular function study: Secondary | ICD-10-CM

## 2023-09-21 DIAGNOSIS — E782 Mixed hyperlipidemia: Secondary | ICD-10-CM | POA: Diagnosis not present

## 2023-09-21 DIAGNOSIS — I10 Essential (primary) hypertension: Secondary | ICD-10-CM

## 2023-09-21 DIAGNOSIS — I25702 Atherosclerosis of coronary artery bypass graft(s), unspecified, with refractory angina pectoris: Secondary | ICD-10-CM

## 2023-09-21 DIAGNOSIS — I129 Hypertensive chronic kidney disease with stage 1 through stage 4 chronic kidney disease, or unspecified chronic kidney disease: Secondary | ICD-10-CM | POA: Diagnosis not present

## 2023-09-21 DIAGNOSIS — N1832 Chronic kidney disease, stage 3b: Secondary | ICD-10-CM | POA: Diagnosis not present

## 2023-09-21 DIAGNOSIS — I2510 Atherosclerotic heart disease of native coronary artery without angina pectoris: Secondary | ICD-10-CM

## 2023-09-21 DIAGNOSIS — N183 Chronic kidney disease, stage 3 unspecified: Secondary | ICD-10-CM | POA: Diagnosis not present

## 2023-09-21 DIAGNOSIS — I25810 Atherosclerosis of coronary artery bypass graft(s) without angina pectoris: Secondary | ICD-10-CM | POA: Diagnosis not present

## 2023-09-21 DIAGNOSIS — G4733 Obstructive sleep apnea (adult) (pediatric): Secondary | ICD-10-CM | POA: Diagnosis not present

## 2023-09-21 DIAGNOSIS — I2582 Chronic total occlusion of coronary artery: Secondary | ICD-10-CM | POA: Diagnosis not present

## 2023-09-21 LAB — CBC
HCT: 38.5 % — ABNORMAL LOW (ref 39.0–52.0)
Hemoglobin: 12.5 g/dL — ABNORMAL LOW (ref 13.0–17.0)
MCH: 30 pg (ref 26.0–34.0)
MCHC: 32.5 g/dL (ref 30.0–36.0)
MCV: 92.3 fL (ref 80.0–100.0)
Platelets: 173 10*3/uL (ref 150–400)
RBC: 4.17 MIL/uL — ABNORMAL LOW (ref 4.22–5.81)
RDW: 12.2 % (ref 11.5–15.5)
WBC: 7.3 10*3/uL (ref 4.0–10.5)
nRBC: 0 % (ref 0.0–0.2)

## 2023-09-21 LAB — BASIC METABOLIC PANEL WITH GFR
Anion gap: 6 (ref 5–15)
BUN: 14 mg/dL (ref 8–23)
CO2: 24 mmol/L (ref 22–32)
Calcium: 9.1 mg/dL (ref 8.9–10.3)
Chloride: 106 mmol/L (ref 98–111)
Creatinine, Ser: 1.75 mg/dL — ABNORMAL HIGH (ref 0.61–1.24)
GFR, Estimated: 39 mL/min — ABNORMAL LOW (ref >60)
Glucose, Bld: 131 mg/dL — ABNORMAL HIGH (ref 70–99)
Potassium: 4.6 mmol/L (ref 3.5–5.1)
Sodium: 136 mmol/L (ref 135–145)

## 2023-09-21 MED ORDER — NITROGLYCERIN 0.4 MG SL SUBL
0.4000 mg | SUBLINGUAL_TABLET | SUBLINGUAL | 6 refills | Status: DC | PRN
  Filled 2023-09-21: qty 25, 1d supply, fill #0

## 2023-09-21 MED ORDER — VALSARTAN 80 MG PO TABS
80.0000 mg | ORAL_TABLET | Freq: Every day | ORAL | 11 refills | Status: DC
  Filled 2023-09-21: qty 30, 30d supply, fill #0

## 2023-09-21 NOTE — Discharge Summary (Addendum)
 Discharge Summary    Patient ID: Joshua Hahn MRN: [id number]; DOB: 28-Apr-1946  Admit date: 09/20/2023 Discharge date: 09/21/2023  PCP:  Almira Jaeger, MD    [facility] Providers Cardiologist:  Magnus Schuller, MD        Discharge Diagnoses    Principal Problem:   Angina of effort [facility])    Diagnostic Studies/Procedures    CARDIAC CATH: 09/20/2023 1.  Severe native vessel coronary artery disease with total occlusion of the ostial LAD, severe calcific stenosis of the distal left main into the proximal circumflex, and chronic total occlusion of the RCA 2.  Status post aortocoronary bypass surgery with known chronic occlusion of the SVG to RCA and wide patency of the bypass graft to the first diagonal and LAD 3.  Successful PCI of severe stenosis in the left main/ostial left circumflex with high-pressure score flex balloon dilatation followed by stenting and high-pressure noncompliant balloon angioplasty.  Mild stent underexpansion due to dense calcification with 20% residual stenosis at the completion of the procedure. 4.  Normal LVEDP   Recommendations: Overnight observation with IV fluid hydration in the setting of chronic kidney disease, anticipate hospital discharge tomorrow morning as long as no complications arise.  Recommend long-term DAPT, continue aspirin  and clopidogrel . Diagnostic Dominance: Right  Intervention    _____________   History of Present Illness     Joshua Hahn is a 78 y.o. male with CAD s/p PTCA, 4V CABG and multiple PCIs dating back to 1988, HTN, HLD, OSA on CPAP, prostate CA.   He had a PET scan on 09/03/2023 showing severe reversible perfusion defect consistent with ischemia.  He was admitted on 09/20/2023 for cath.  Hospital Course     Consultants: None  The LHC showed severe native CAD (ostial LAD CTO, severe calcific stenosis of distal left main into proximal LCx and CTO of RCA, s/p CABG with known CTO of SVG to RCA, patent  bypass graft to LAD and D1. He underwent successful PCI of severe stenosis in the left main/ostial left circumflex, had mild stent underexpansion due to dense calcification with 20% residual stenosis at the completion of the procedure.  Normal LVEDP.    On 5/17, he was evaluated by Dr. Dorina Garin and all data were reviewed.    Serum creatinine today is 1.7, improved prior to outpatient labs.    Continue long-term DAPT, aspirin  81 mg once daily, Plavix  75 mg once daily.  Left radial access site is intact.    Continue antianginal therapy with Imdur  120 mg once daily, metoprolol  tartrate 12.5 mg twice daily, ranolazine  1000 mg twice daily and amlodipine  7.5 mg once daily.  Continue rosuvastatin  40 mg nightly and Zetia  10 mg once daily.   His valsartan  was held for cath, but will be resumed as an outpatient.  No further inpatient workup is indicated and he is considered stable for discharge, to follow-up as an outpatient.     Did the patient have an acute coronary syndrome (MI, NSTEMI, STEMI, etc) this admission?:  No                               Did the patient have a percutaneous coronary intervention (stent / angioplasty)?:  Yes.     Cath/PCI Registry Performance & Quality Measures: Aspirin  prescribed? - Yes ADP Receptor Inhibitor (Plavix /Clopidogrel , Brilinta /Ticagrelor  or Effient/Prasugrel) prescribed (includes medically managed patients)? - Yes High Intensity Statin (Lipitor 40-80mg  or Crestor   20-40mg ) prescribed? - Yes For EF <40%, was ACEI/ARB prescribed? - Not Applicable (EF >/= 40%) For EF <40%, Aldosterone Antagonist (Spironolactone or Eplerenone) prescribed? - Not Applicable (EF >/= 40%) Cardiac Rehab Phase II ordered? - Yes    _____________  Discharge Vitals Blood pressure (!) 140/75, pulse (!) 56, temperature 97.6 F (36.4 C), temperature source Oral, resp. rate 20, height 5\' 10"  (1.778 m), weight 97.3 kg, SpO2 98%.  Filed Weights   09/20/23 0840 09/20/23 1803  Weight:  93.4 kg 97.3 kg    Labs & Radiologic Studies    CBC Recent Labs    09/21/23 0418  WBC 7.3  HGB 12.5*  HCT 38.5*  MCV 92.3  PLT 173   Basic Metabolic Panel Recent Labs    14/78/29 0418  NA 136  K 4.6  CL 106  CO2 24  GLUCOSE 131*  BUN 14  CREATININE 1.75*  CALCIUM  9.1   Liver Function Tests No results for input(s): "AST", "ALT", "ALKPHOS", "BILITOT", "PROT", "ALBUMIN" in the last 72 hours. No results for input(s): "LIPASE", "AMYLASE" in the last 72 hours. High Sensitivity Troponin:   No results for input(s): "TROPONINIHS" in the last 720 hours.  BNP Invalid input(s): "POCBNP" D-Dimer No results for input(s): "DDIMER" in the last 72 hours. Hemoglobin A1C No results for input(s): "HGBA1C" in the last 72 hours. Fasting Lipid Panel No results for input(s): "CHOL", "HDL", "LDLCALC", "TRIG", "CHOLHDL", "LDLDIRECT" in the last 72 hours. Thyroid  Function Tests No results for input(s): "TSH", "T4TOTAL", "T3FREE", "THYROIDAB" in the last 72 hours.  Invalid input(s): "FREET3" _____________  CARDIAC CATHETERIZATION Result Date: 09/20/2023 1.  Severe native vessel coronary artery disease with total occlusion of the ostial LAD, severe calcific stenosis of the distal left main into the proximal circumflex, and chronic total occlusion of the RCA 2.  Status post aortocoronary bypass surgery with known chronic occlusion of the SVG to RCA and wide patency of the bypass graft to the first diagonal and LAD 3.  Successful PCI of severe stenosis in the left main/ostial left circumflex with high-pressure score flex balloon dilatation followed by stenting and high-pressure noncompliant balloon angioplasty.  Mild stent underexpansion due to dense calcification with 20% residual stenosis at the completion of the procedure. 4.  Normal LVEDP Recommendations: Overnight observation with IV fluid hydration in the setting of chronic kidney disease, anticipate hospital discharge tomorrow morning as long  as no complications arise.  Recommend long-term DAPT, continue aspirin  and clopidogrel .   NM PET CT CARDIAC PERFUSION MULTI W/ABSOLUTE BLOODFLOW Result Date: 09/03/2023   Severe reversible perfusion defect in mid to basal inferior/inferolateral walls consistent with ischemia.  In addition, TID is present (1.14), which is a high risk finding.  Myocardial blood flow reserve is reduced, though unreliable in setting of prior CABG.  Overall, findings suggest ischemia and study is high risk.  Cardiac catheterization recommended.   LV perfusion is abnormal. There is evidence of ischemia. Defect 1: There is a medium defect with severe reduction in uptake present in the mid to basal inferior and inferolateral location(s) that is reversible. There is normal wall motion in the defect area. Consistent with ischemia.   Rest left ventricular function is abnormal. Rest EF: 48%. Stress left ventricular function is normal. Stress EF: 54%. End diastolic cavity size is mildly enlarged. End systolic cavity size is mildly enlarged.   Myocardial blood flow was computed to be 0.11ml/g/min at rest and 1.28ml/g/min at stress. Global myocardial blood flow reserve was 1.62 and was abnormal.  Coronary calcium  assessment not performed due to prior revascularization.   Findings are consistent with ischemia. The study is high risk.   Electronically signed by Carson Clara, MD EXAM: OVER-READ INTERPRETATION  PET-CT CHEST The following report is an over-read performed by radiologist Dr. Kasandra Pain [facility] on 09/03/2023. This over-read does not include interpretation of cardiac or coronary anatomy or pathology. The cardiac PET and cardiac CT interpretation by the cardiologist is to be attached. COMPARISON:  Chest CTA 08/16/2023 FINDINGS: No evidence for lymphadenopathy within the visualized mediastinum or hilar regions. The visualized lung parenchyma shows no suspicious pulmonary nodule or mass. No focal airspace  consolidation. Volume loss in the lower lobes is associated with probable atelectasis although underlying component of chronic interstitial lung disease cannot be excluded. Visualized portions of the upper abdomen are unremarkable. No suspicious lytic or sclerotic osseous abnormality. IMPRESSION: 1. No acute or significant extracardiac findings. 2. Volume loss in the lower lobes is associated with probable atelectasis although underlying component of chronic interstitial lung disease cannot be excluded. Electronically Signed   By: Donnal Fusi M.D.   On: 09/03/2023 09:45  Disposition   Pt is being discharged home today in good condition.  Follow-up Plans & Appointments     Follow-up Information     Meng, [place], [place] Follow up.   Specialties: Cardiology, Radiology Why: Hospital follow-up scheduled for 10/07/2023 at 8:50am. Please arrive 15 minutes early for check-in. If this date/ time does not work for you, please call our office to reschedule. Contact information: [address] [place] [address] [phone]                Discharge Instructions     Amb Referral to Cardiac Rehabilitation   Complete by: As directed    Diagnosis: Coronary Stents   After initial evaluation and assessments completed: Virtual Based Care may be provided alone or in conjunction with Phase 2 Cardiac Rehab based on patient barriers.: Yes   Intensive Cardiac Rehabilitation (ICR) [facility] location only OR Traditional Cardiac Rehabilitation (TCR) *If criteria for ICR are not met will enroll in TCR [facility] only): Yes   Diet - low sodium heart healthy   Complete by: As directed    Increase activity slowly   Complete by: As directed         Discharge Medications   Allergies as of 09/21/2023   No Known Allergies      Medication List     STOP taking these medications    hydrochlorothiazide  25 MG tablet Commonly known as: HYDRODIURIL        TAKE these medications    amLODipine  5 MG  tablet Commonly known as: NORVASC  Take 1.5 tablets (7.5 mg total) by mouth daily.   aspirin  81 MG tablet Take 81 mg by mouth daily.   cholecalciferol  1000 units tablet Commonly known as: VITAMIN D  Take 1,000 Units by mouth daily.   clopidogrel  75 MG tablet Commonly known as: PLAVIX  TAKE 1 TABLET BY MOUTH EVERY DAY   Co Q 10 100 MG Caps Take 300 mg by mouth every evening.   dorzolamide 2 % ophthalmic solution Commonly known as: TRUSOPT Place 1 drop into both eyes daily.   ezetimibe  10 MG tablet Commonly known as: ZETIA  TAKE 1 TABLET BY MOUTH EVERY DAY   isosorbide  mononitrate 120 MG 24 hr tablet Commonly known as: IMDUR  Take 1 tablet (120 mg total) by mouth daily.   metoprolol  tartrate 25 MG tablet Commonly known as: LOPRESSOR  TAKE ONE TABLET  IN THE MORNING AND HALF TABLET IN THE EVENING.   nitroGLYCERIN  0.4 MG SL tablet Commonly known as: NITROSTAT  Place 1 tablet (0.4 mg total) under the tongue every 5 (five) minutes as needed for chest pain.   omega-3 acid ethyl esters 1 g capsule Commonly known as: LOVAZA  TAKE 1 CAPSULE BY MOUTH 2 TIMES DAILY.   ranolazine  1000 MG SR tablet Commonly known as: Ranexa  Take 1 tablet (1,000 mg total) by mouth 2 (two) times daily.   rosuvastatin  40 MG tablet Commonly known as: CRESTOR  Take 1 tablet (40 mg total) by mouth daily. Please keep scheduled appointment   valsartan  80 MG tablet Commonly known as: DIOVAN  Take 1 tablet (80 mg total) by mouth daily.   vitamin B-12 100 MCG tablet Commonly known as: CYANOCOBALAMIN  Take 100 mcg by mouth daily.   VITAMIN C  PO Take 1 tablet by mouth daily.           Outstanding Labs/Studies   None Duration of Discharge Encounter: APP Time: 31 minutes   Signed, Armandina Bernard, PA-C 09/21/2023, 9:26 AM

## 2023-09-21 NOTE — Progress Notes (Addendum)
 Progress Note  Patient Name: Joshua Hahn Date of Encounter: 09/21/2023  Primary Cardiologist: Magnus Schuller, MD  Subjective   No chest pains.  Left radial access are intact.  No symptoms.  Inpatient Medications    Scheduled Meds:  amLODipine   7.5 mg Oral Daily   aspirin   81 mg Oral Daily   clopidogrel   75 mg Oral Daily   ezetimibe   10 mg Oral Daily   isosorbide  mononitrate  120 mg Oral Daily   metoprolol  tartrate  12.5 mg Oral BID   ranolazine   1,000 mg Oral BID   rosuvastatin   40 mg Oral Daily   sodium chloride  flush  3 mL Intravenous Q12H   Continuous Infusions:  sodium chloride      PRN Meds: sodium chloride , acetaminophen , nitroGLYCERIN , ondansetron  (ZOFRAN ) IV, sodium chloride  flush   Vital Signs    Vitals:   09/20/23 2233 09/20/23 2304 09/20/23 2359 09/21/23 0533  BP: (!) 144/67 (!) 152/65 124/68 (!) 142/72  Pulse: (!) 54 (!) 51 (!) 54 (!) 56  Resp:   16 18  Temp:   98.1 F (36.7 C) 98.5 F (36.9 C)  TempSrc:   Oral Oral  SpO2: 95% 98% 98% 98%  Weight:      Height:        Intake/Output Summary (Last 24 hours) at 09/21/2023 0751 Last data filed at 09/21/2023 0700 Gross per 24 hour  Intake 1375.63 ml  Output 700 ml  Net 675.63 ml   Filed Weights   09/20/23 0840 09/20/23 1803  Weight: 93.4 kg 97.3 kg    Telemetry     Personally reviewed.  NSR.  ECG    Not performed today.  Physical Exam   GEN: No acute distress.   Neck: No JVD. Cardiac: RRR, no murmur, rub, or gallop.  Respiratory: Nonlabored. Clear to auscultation bilaterally. GI: Soft, nontender, bowel sounds present. MS: No edema; No deformity. Neuro:  Nonfocal. Psych: Alert and oriented x 3. Normal affect.  Labs    Chemistry Recent Labs  Lab 09/21/23 0418  NA 136  K 4.6  CL 106  CO2 24  GLUCOSE 131*  BUN 14  CREATININE 1.75*  CALCIUM  9.1  GFRNONAA 39*  ANIONGAP 6     Hematology Recent Labs  Lab 09/21/23 0418  WBC 7.3  RBC 4.17*  HGB 12.5*  HCT 38.5*  MCV  92.3  MCH 30.0  MCHC 32.5  RDW 12.2  PLT 173    Cardiac EnzymesNo results for input(s): "TROPONINIHS" in the last 720 hours.  BNPNo results for input(s): "BNP", "PROBNP" in the last 168 hours.   DDimerNo results for input(s): "DDIMER" in the last 168 hours.   Radiology    CARDIAC CATHETERIZATION Result Date: 09/20/2023 1.  Severe native vessel coronary artery disease with total occlusion of the ostial LAD, severe calcific stenosis of the distal left main into the proximal circumflex, and chronic total occlusion of the RCA 2.  Status post aortocoronary bypass surgery with known chronic occlusion of the SVG to RCA and wide patency of the bypass graft to the first diagonal and LAD 3.  Successful PCI of severe stenosis in the left main/ostial left circumflex with high-pressure score flex balloon dilatation followed by stenting and high-pressure noncompliant balloon angioplasty.  Mild stent underexpansion due to dense calcification with 20% residual stenosis at the completion of the procedure. 4.  Normal LVEDP Recommendations: Overnight observation with IV fluid hydration in the setting of chronic kidney disease, anticipate hospital discharge tomorrow morning  as long as no complications arise.  Recommend long-term DAPT, continue aspirin  and clopidogrel .     Assessment & Plan    CAD s/p PTCA, 4V CABG and multiple PCI: Presented for elective LHC + PCI due to positive cardiac stress test and exertional angina similar to index presentation.  LHC showed severe native CAD (ostial LAD CTO, severe calcific stenosis of distal left main into proximal LCx and CTO of RCA, s/p CABG with known CTO of SVG to RCA, patent bypass graft to LAD and D1, underwent successful PCI of severe stenosis in the left main/ostial left circumflex, had mild stent underexpansion due to dense calcification with 20% residual stenosis at the completion of the procedure.  Normal LVEDP.  Serum creatinine today is 1.7, improved prior to  outpatient labs.  Continue long-term DAPT, aspirin  81 mg once daily, Plavix  75 mg once daily.  Left radial access site intact.  Continue antianginal therapy with Imdur  120 mg once daily, metoprolol  tartrate 12.5 mg twice daily, ranolazine  1000 mg twice daily and amlodipine  7.5 mg once daily.  Continue rosuvastatin  40 mg nightly and Zetia  10 mg once daily.  HTN, partially controlled: Valsartan  on hold for LHC.  No history of DM2.  Resume upon discharge for CKD.  Otherwise, continue current/above antianginal therapy.  CKD stage III, unclear if A or B: Serum creatinine 1.7, stable and improved compared to outpatient labs.  Will resume valsartan  upon discharge.  Dispo: Home today.  Signed, Lasalle Pointer, MD  09/21/2023, 7:51 AM

## 2023-09-21 NOTE — Progress Notes (Signed)
 CARDIAC REHAB PHASE I   PRE:  Rate/Rhythm: 64 NSR  BP:  Sitting: 140/75        MODE:  Ambulation: 470 ft   POST:  Rate/Rhythm: 60 NSR  BP:  Sitting: 160/68        Pt tolerated exercise well and amb 470 ft with  stand-by assist. Pt denies CP, SOB, or dizziness throughout walk. Pt placed back in bed with call bell in reach.  Post stent education including site care, restrictions, heart healthy diet, exercise guidelines, nitroglycerin  use and CRP2 reviewed. All questions and concerns addressed. Will refer to [facility] for CRP2.     [id number] Lajuana Pilar, RRT, BSRT 09/21/2023 9:06 AM

## 2023-09-21 NOTE — Discharge Instructions (Signed)
PLEASE REMEMBER TO BRING ALL OF YOUR MEDICATIONS TO EACH OF YOUR FOLLOW-UP OFFICE VISITS. ? ?PLEASE ATTEND ALL SCHEDULED FOLLOW-UP APPOINTMENTS.  ? ?Activity: Increase activity slowly as tolerated. You may shower, but no soaking baths (or swimming) for 1 week. No driving for 2 days. No lifting over 5 lbs for 1 week. No sexual activity for 1 week.  ? ?You May Return to Work: in 1 week (if applicable) ? ?Wound Care: You may wash cath site gently with soap and water. Keep cath site clean and dry. If you notice pain, swelling, bleeding or pus at your cath site, please call [phone]. ? ? ? ?Cardiac Cath Site Care ?Refer to this sheet in the next few weeks. These instructions provide you with information on caring for yourself after your procedure. Your caregiver may also give you more specific instructions. Your treatment has been planned according to current medical practices, but problems sometimes occur. Call your caregiver if you have any problems or questions after your procedure. ?HOME CARE INSTRUCTIONS ?You may shower 24 hours after the procedure. Remove the bandage (dressing) and gently wash the site with plain soap and water. Gently pat the site dry.  ?Do not apply powder or lotion to the site.  ?Do not sit in a bathtub, swimming pool, or whirlpool for 5 to 7 days.  ?No bending, squatting, or lifting anything over 10 pounds (4.5 kg) as directed by your caregiver.  ?Inspect the site at least twice daily.  ?Do not drive home if you are discharged the same day of the procedure. Have someone else drive you.  ?You may drive 24 hours after the procedure unless otherwise instructed by your caregiver.  ?What to expect: ?Any bruising will usually fade within 1 to 2 weeks.  ?Blood that collects in the tissue (hematoma) may be painful to the touch. It should usually decrease in size and tenderness within 1 to 2 weeks.  ?SEEK IMMEDIATE MEDICAL CARE IF: ?You have unusual pain at the site or down the affected limb.  ?You  have redness, warmth, swelling, or pain at the site.  ?You have drainage (other than a small amount of blood on the dressing).  ?You have chills.  ?You have a fever or persistent symptoms for more than 72 hours.  ?You have a fever and your symptoms suddenly get worse.  ?Your leg becomes pale, cool, tingly, or numb.  ?You have heavy bleeding from the site. Hold pressure on the site.  ?Document Released: 05/26/2010 Document Revised: 04/12/2011 Document Reviewed: 05/26/2010 ?ExitCare? Patient Information ?2012 [employer]. ? ?

## 2023-09-22 ENCOUNTER — Encounter (HOSPITAL_COMMUNITY): Payer: Self-pay | Admitting: Cardiovascular Disease

## 2023-09-23 MED FILL — Nitroglycerin IV Soln 100 MCG/ML in D5W: INTRA_ARTERIAL | Qty: 10 | Status: AC

## 2023-10-03 ENCOUNTER — Other Ambulatory Visit

## 2023-10-03 ENCOUNTER — Telehealth (HOSPITAL_COMMUNITY): Payer: Self-pay

## 2023-10-03 NOTE — Telephone Encounter (Signed)
 Called patient to see if he is interested in the Cardiac Rehab Program. Patient expressed interest. Explained scheduling process and went over insurance, patient verbalized understanding. Will contact patient for scheduling once f/u has been completed.

## 2023-10-03 NOTE — Telephone Encounter (Signed)
 Pt insurance is active and benefits verified through Medicare A/B. Co-pay $0.00, DED $257.00/$257.00 met, out of pocket $0.00/$0.00 met, co-insurance 20%. No pre-authorization required. Passport, 10/03/23 @ 1:19PM, REF#[id number]   How many CR sessions are covered? (36 visits for TCR, 72 visits for ICR)72 Is this a lifetime maximum or an annual maximum? Lifetime Has the member used any of these services to date? 35 Is there a time limit (weeks/months) on start of program and/or program completion? No   2ndary insurance is active and benefits verified through [employer]. Co-pay $0.00, DED $0.00/$0.00 met, out of pocket $0.00/$0.00 met, co-insurance 0%. No pre-authorization required. Passport, 10/03/23 @ 1:24PM, REF#[id number]      Will contact patient to see if he is interested in the Cardiac Rehab Program. If interested, patient will need to complete follow up appt. Once completed, patient will be contacted for scheduling upon review by the RN Navigator.

## 2023-10-07 ENCOUNTER — Encounter: Payer: Self-pay | Admitting: Physician Assistant

## 2023-10-07 ENCOUNTER — Ambulatory Visit: Attending: Physician Assistant | Admitting: Physician Assistant

## 2023-10-07 VITALS — BP 140/72 | HR 55 | Ht 71.0 in | Wt 210.0 lb

## 2023-10-07 DIAGNOSIS — N183 Chronic kidney disease, stage 3 unspecified: Secondary | ICD-10-CM

## 2023-10-07 DIAGNOSIS — E782 Mixed hyperlipidemia: Secondary | ICD-10-CM | POA: Diagnosis not present

## 2023-10-07 DIAGNOSIS — I10 Essential (primary) hypertension: Secondary | ICD-10-CM

## 2023-10-07 DIAGNOSIS — I25708 Atherosclerosis of coronary artery bypass graft(s), unspecified, with other forms of angina pectoris: Secondary | ICD-10-CM

## 2023-10-07 NOTE — Patient Instructions (Signed)
 Medication Instructions:  NO CHANGES *If you need a refill on your cardiac medications before your next appointment, please call your pharmacy*  Lab Work: BMP TODAY If you have labs (blood work) drawn today and your tests are completely normal, you will receive your results only by: MyChart Message (if you have MyChart) OR A paper copy in the mail If you have any lab test that is abnormal or we need to change your treatment, we will call you to review the results.  Testing/Procedures: NO TESTING  Follow-Up: At [facility], you and your health needs are our priority.  As part of our continuing mission to provide you with exceptional heart care, our providers are all part of one team.  This team includes your primary Cardiologist (physician) and Advanced Practice Providers or APPs (Physician Assistants and Nurse Practitioners) who all work together to provide you with the care you need, when you need it.  Your next appointment:   4 month(s)  Provider:   Arnoldo Lapping, MD

## 2023-10-07 NOTE — Progress Notes (Signed)
 Cardiology Office Note   Date:  10/07/2023  ID:  Joshua Hahn, Joshua Hahn [birth year], MRN [id number] PCP: Almira Jaeger, MD  [facility] Providers Cardiologist:  Arnoldo Lapping, MD  Nephrologist: Dr. Ansel Kingdom  History of Present Illness Joshua Hahn is a 78 y.o. male with PMH of CAD s/p CABG, HTN, HLD, and OSA.  Patient had a PTCA to distal RCA in 1988.  He had a inferior wall MI in 1994 and the subsequently underwent CABG with free LIMA to large D1, SVG to LAD, SVG to OM1 and OM 2, SVG to RCA.  Cardiac catheterization in 2010 showed significant native CAD with total occlusion of LAD, diffuse AV groove left circumflex stenosis, occluded SVG to RCA, patent LIMA to diagonal, patent SVG to LAD, occluded SVG to obtuse marginal.  He underwent complex intervention to native left circumflex on 08/23/2008 and had placement of 2 DES.  Myoview  in January 2013 was low risk, fixed basal to mid inferior scar.  He developed recurrent chest pain in October 2014, repeat cardiac catheterization revealed 95% focal in-stent restenosis of the distal third of the stent in the left circumflex vessel, he also had high-grade lesion in the SVG to mid LAD.  Left circumflex vessel was treated with PTCA, SVG to LAD was treated with PTCA and DES.  He has been using CPAP machine for his obstructive sleep apnea.  Myoview  in January 2022 show evidence of prior inferior scar with minimal peri-infarct ischemia, very mild inferolateral ischemia most likely due to distal small marginal branch stenosis.  He was managed medically.  Patient was restarted CPAP therapy in Nov 2024.  I last saw the patient on 08/22/2023 at which time he just left the emergency room with 3-day onset of chest discomfort.  Symptom was atypical.  I increased his Imdur  and increased the Ranexa  dosage and ordered a PET stress test.  PET stress test came back abnormal with severe reversible perfusion defect in the mid to basal inferior and inferolateral wall  consistent with ischemia. He was seen by Dr. Loetta Ringer on 09/10/2023 who recommended proceed with cardiac catheterization.  He ultimately underwent cardiac catheterization on 09/20/2023 by Dr. Arlester Ladd which showed severe native CAD with total occlusion of ostial LAD, severe calcified disease of distal left main into the proximal left circumflex and CTO of RCA, chronic occlusion of SVG to RCA, patent SVG to large D1 and LIMA to LAD.  Patient underwent successful PCI of severe stenosis of left main and ostial left circumflex artery with balloon angioplasty followed by stenting.  Postprocedure, he was kept in the hospital overnight for IV hydration given underlying CKD.  Valsartan  was resumed post cath.  Patient presents today for follow-up.  He denies any chest pain or shortness of breath.  Overall, he has been doing very well since cardiac optimization.  Chest pain has completely resolved.  He is back to exercising.  He has been compliant with aspirin  and Plavix .  He is aware to take the dual antiplatelet therapy for minimum of 1 year after stent placement.  Since his primary cardiologist Dr. Loetta Ringer is retiring, I will set him up with Dr. Arlester Ladd who did his recent cath.  He can follow-up with Dr. Arlester Ladd in 41-month.  ROS:   He denies chest pain, palpitations, dyspnea, pnd, orthopnea, n, v, dizziness, syncope, edema, weight gain, or early satiety. All other systems reviewed and are otherwise negative except as noted above.    Studies Reviewed EKG Interpretation Date/Time:  Monday  October 07 2023 09:01:35 EDT Ventricular Rate:  55 PR Interval:  296 QRS Duration:  102 QT Interval:  416 QTC Calculation: 397 R Axis:   4  Text Interpretation: Sinus bradycardia with 1st degree A-V block Possible Inferior infarct , age undetermined When compared with ECG of 20-Sep-2023 17:06, Borderline criteria for Inferior infarct are now Present Nonspecific T wave abnormality has replaced inverted T waves in Inferior leads Confirmed  by Cordelle Dahmen [phone]) on 10/07/2023 9:09:12 AM    Cardiac Studies & Procedures   ______________________________________________________________________________________________ CARDIAC CATHETERIZATION  CARDIAC CATHETERIZATION 09/20/2023  Conclusion 1.  Severe native vessel coronary artery disease with total occlusion of the ostial LAD, severe calcific stenosis of the distal left main into the proximal circumflex, and chronic total occlusion of the RCA 2.  Status post aortocoronary bypass surgery with known chronic occlusion of the SVG to RCA and wide patency of the bypass graft to the first diagonal and LAD 3.  Successful PCI of severe stenosis in the left main/ostial left circumflex with high-pressure score flex balloon dilatation followed by stenting and high-pressure noncompliant balloon angioplasty.  Mild stent underexpansion due to dense calcification with 20% residual stenosis at the completion of the procedure. 4.  Normal LVEDP  Recommendations: Overnight observation with IV fluid hydration in the setting of chronic kidney disease, anticipate hospital discharge tomorrow morning as long as no complications arise.  Recommend long-term DAPT, continue aspirin  and clopidogrel .  Findings Coronary Findings Diagnostic  Dominance: Right  Left Anterior Descending Ost LAD to Prox LAD lesion is 100% stenosed.  Left Circumflex Ost Cx to Mid Cx lesion is 90% stenosed with 0% stenosed side branch in 2nd Mrg. The lesion is eccentric. The lesion is severely calcified. The lesion was previously treated . Dist Cx lesion is 100% stenosed.  Second Obtuse Marginal Branch Collaterals 2nd Mrg filled by collaterals from 2nd Diag.  Right Coronary Artery Prox RCA lesion is 100% stenosed.  Right Posterior Descending Artery Collaterals RPDA filled by collaterals from 2nd Mrg.  First Right Posterolateral Branch Collaterals 1st RPL filled by collaterals from 1st Mrg.  Graft To RPDA Origin to Prox Graft  lesion is 100% stenosed.  Graft To 1st Diag  Graft To Mid LAD Previously placed Origin to Prox Graft stent of unknown type is  widely patent.  Intervention  Ost Cx to Mid Cx lesion with side branch in 2nd Mrg Stent - Main Branch CATH VISTA GUIDE 6FR [id number] guide catheter was inserted. Lesion crossed with guidewire using a WIRE RUNTHROUGH .[id number]. Pre-stent angioplasty was performed using a BALLOON SCOREFLEX 3.0X10. Maximum pressure:  20 atm. A drug-eluting stent was successfully placed using a STENT SYNERGY XD 3.0X16. Maximum pressure: 16 atm. Post-stent angioplasty was performed using a BALLOON [place] EMERGE MR 3.5X12. Maximum pressure:  20 atm. Post-Intervention Lesion Assessment The intervention was successful. Pre-interventional TIMI flow is 3. Post-intervention TIMI flow is 3. No complications occurred at this lesion. There is a 0% residual stenosis in the main branch post intervention. There is a 20% residual stenosis in the side branch post intervention.   STRESS TESTS  NM PET CT CARDIAC PERFUSION MULTI W/ABSOLUTE BLOODFLOW 09/03/2023  Narrative   Severe reversible perfusion defect in mid to basal inferior/inferolateral walls consistent with ischemia.  In addition, TID is present (1.14), which is a high risk finding.  Myocardial blood flow reserve is reduced, though unreliable in setting of prior CABG.  Overall, findings suggest ischemia and study is high risk.  Cardiac catheterization recommended.  LV perfusion is abnormal. There is evidence of ischemia. Defect 1: There is a medium defect with severe reduction in uptake present in the mid to basal inferior and inferolateral location(s) that is reversible. There is normal wall motion in the defect area. Consistent with ischemia.   Rest left ventricular function is abnormal. Rest EF: 48%. Stress left ventricular function is normal. Stress EF: 54%. End diastolic cavity size is mildly enlarged. End systolic cavity size is mildly  enlarged.   Myocardial blood flow was computed to be 0.44ml/g/min at rest and 1.28ml/g/min at stress. Global myocardial blood flow reserve was 1.62 and was abnormal.   Coronary calcium  assessment not performed due to prior revascularization.   Findings are consistent with ischemia. The study is high risk.   Electronically signed by Carson Clara, MD  EXAM: OVER-READ INTERPRETATION  PET-CT CHEST  The following report is an over-read performed by radiologist Dr. Kasandra Pain [facility] on 09/03/2023. This over-read does not include interpretation of cardiac or coronary anatomy or pathology. The cardiac PET and cardiac CT interpretation by the cardiologist is to be attached.  COMPARISON:  Chest CTA 08/16/2023  FINDINGS: No evidence for lymphadenopathy within the visualized mediastinum or hilar regions.  The visualized lung parenchyma shows no suspicious pulmonary nodule or mass. No focal airspace consolidation. Volume loss in the lower lobes is associated with probable atelectasis although underlying component of chronic interstitial lung disease cannot be excluded.  Visualized portions of the upper abdomen are unremarkable.  No suspicious lytic or sclerotic osseous abnormality.  IMPRESSION: 1. No acute or significant extracardiac findings. 2. Volume loss in the lower lobes is associated with probable atelectasis although underlying component of chronic interstitial lung disease cannot be excluded.   Electronically Signed By: Donnal Fusi M.D. On: 09/03/2023 09:45            ______________________________________________________________________________________________      Risk Assessment/Calculations          Physical Exam VS:  BP (!) 140/72   Pulse (!) 55   Ht 5\' 11"  (1.803 m)   Wt 210 lb (95.3 kg)   SpO2 96%   BMI 29.29 kg/m    Wt Readings from Last 3 Encounters:  10/07/23 210 lb (95.3 kg)  09/20/23 214 lb 6.4 oz (97.3 kg)   09/10/23 208 lb 12.8 oz (94.7 kg)    GEN: Well nourished, well developed in no acute distress NECK: No JVD; No carotid bruits CARDIAC: RRR, no murmurs, rubs, gallops RESPIRATORY:  Clear to auscultation without rales, wheezing or rhonchi  ABDOMEN: Soft, non-tender, non-distended EXTREMITIES:  No edema; No deformity   ASSESSMENT AND PLAN   CAD s/p CABG: PET stress test was abnormal, patient underwent cardiac catheterization which showed severe disease from distal left main into the ostial left circumflex artery, this was treated with drug-eluting stent.  He has been compliant with aspirin  and Plavix  since discharge, chest pain has completely resolved.  Hypertension: Blood pressure is borderline elevated today, however previously normal  Hyperlipidemia: Continue on Crestor  and Zetia .  Cholesterol very well-controlled  CKD stage III: Followed by Dr. Nicolas Barren of [facility].  Will obtain basic metabolic panel today to make sure renal function remained stable.    Cardiac Rehabilitation Eligibility Assessment  The patient is ready to start cardiac rehabilitation from a cardiac standpoint.       Dispo: Set up with Dr. Arlester Ladd in 4 months  Signed, Takiah Maiden, [place]

## 2023-10-08 ENCOUNTER — Other Ambulatory Visit (HOSPITAL_COMMUNITY): Payer: Self-pay

## 2023-10-08 ENCOUNTER — Ambulatory Visit: Payer: Self-pay | Admitting: Physician Assistant

## 2023-10-08 ENCOUNTER — Other Ambulatory Visit: Payer: Self-pay | Admitting: Physician Assistant

## 2023-10-08 DIAGNOSIS — E875 Hyperkalemia: Secondary | ICD-10-CM

## 2023-10-08 LAB — BASIC METABOLIC PANEL WITH GFR
BUN/Creatinine Ratio: 7 — ABNORMAL LOW (ref 10–24)
BUN: 14 mg/dL (ref 8–27)
CO2: 22 mmol/L (ref 20–29)
Calcium: 10.1 mg/dL (ref 8.6–10.2)
Chloride: 106 mmol/L (ref 96–106)
Creatinine, Ser: 2.01 mg/dL — ABNORMAL HIGH (ref 0.76–1.27)
Glucose: 120 mg/dL — ABNORMAL HIGH (ref 70–99)
Potassium: 5.8 mmol/L (ref 3.5–5.2)
Sodium: 142 mmol/L (ref 134–144)
eGFR: 33 mL/min/{1.73_m2} — ABNORMAL LOW (ref >59)

## 2023-10-08 MED ORDER — LOKELMA 10 G PO PACK: PACK | ORAL | 0 refills | Status: DC

## 2023-10-08 MED ORDER — LOKELMA 10 G PO PACK
1.0000 | PACK | ORAL | 0 refills | Status: DC | PRN
  Filled 2023-10-08 (×2): qty 7, 7d supply, fill #0

## 2023-10-08 NOTE — Addendum Note (Signed)
 Addended by: Reyes Caul, Katianne Barre L on: 10/08/2023 10:34 AM   Modules accepted: Orders

## 2023-10-08 NOTE — Telephone Encounter (Signed)
 OVERNIGHT CARDIOLOGY NOTE:  Outpatient potassium level came back at 5.8. Notified Joshua Hahn. I will notify ordering provider, Joshua Hahn.   Joshua Hahn Moonlighter

## 2023-10-08 NOTE — Progress Notes (Signed)
 I spoke with Joshua Hahn, instead of the original instruction of 2 packets, I asked him to only take 1 packet because of his baseline renal function (the Rx is for 7 packets, but he only take 1 packet and store the other 6 else where in case his potassium rise again in the future), repeat BMET in 1 week. I asked him to come to our office to pick up the coupon card

## 2023-10-09 ENCOUNTER — Encounter (HOSPITAL_COMMUNITY): Payer: Self-pay

## 2023-10-10 ENCOUNTER — Encounter (HOSPITAL_COMMUNITY): Payer: Self-pay

## 2023-10-11 DIAGNOSIS — E875 Hyperkalemia: Secondary | ICD-10-CM | POA: Diagnosis not present

## 2023-10-12 LAB — BASIC METABOLIC PANEL WITH GFR
BUN/Creatinine Ratio: 10 (ref 10–24)
BUN: 19 mg/dL (ref 8–27)
CO2: 23 mmol/L (ref 20–29)
Calcium: 10.1 mg/dL (ref 8.6–10.2)
Chloride: 102 mmol/L (ref 96–106)
Creatinine, Ser: 1.94 mg/dL — ABNORMAL HIGH (ref 0.76–1.27)
Glucose: 96 mg/dL (ref 70–99)
Potassium: 5.1 mmol/L (ref 3.5–5.2)
Sodium: 137 mmol/L (ref 134–144)
eGFR: 35 mL/min/{1.73_m2} — ABNORMAL LOW (ref >59)

## 2023-10-13 ENCOUNTER — Ambulatory Visit: Payer: Self-pay | Admitting: Physician Assistant

## 2023-10-13 NOTE — Progress Notes (Signed)
 Potassium normalized. Came down from 5.8 to 5.1

## 2023-10-16 ENCOUNTER — Telehealth (HOSPITAL_COMMUNITY): Payer: Self-pay

## 2023-10-16 NOTE — Telephone Encounter (Signed)
  Called pt to confirm appt for 10/17/23 at 0930. Gave pt instructions for appt, what to wear, office address, eating/taking meds before, and if sick to call and reschedule. Pt voiced understanding, all questions answered.   Health history completed? No   Arlander Labrum, MS, ACSM-CEP 10/16/2023 4:02 PM

## 2023-10-17 ENCOUNTER — Encounter (HOSPITAL_COMMUNITY)
Admission: RE | Admit: 2023-10-17 | Discharge: 2023-10-17 | Disposition: A | Discharge status: Home / Self Care | Source: Ambulatory Visit | Attending: Cardiovascular Disease | Admitting: Cardiovascular Disease

## 2023-10-17 ENCOUNTER — Encounter (HOSPITAL_COMMUNITY): Payer: Self-pay

## 2023-10-17 VITALS — BP 118/74 | HR 60 | Ht 70.5 in | Wt 210.8 lb

## 2023-10-17 DIAGNOSIS — Z955 Presence of coronary angioplasty implant and graft: Secondary | ICD-10-CM | POA: Insufficient documentation

## 2023-10-17 DIAGNOSIS — Z45812 Encounter for adjustment or removal of left breast implant: Secondary | ICD-10-CM | POA: Diagnosis not present

## 2023-10-17 DIAGNOSIS — Z48812 Encounter for surgical aftercare following surgery on the circulatory system: Secondary | ICD-10-CM | POA: Diagnosis present

## 2023-10-17 NOTE — Progress Notes (Signed)
 Cardiac Rehab Medication Review by a Nurse  Does the patient  feel that his/her medications are working for him/her?  yes  Has the patient been experiencing any side effects to the medications prescribed?  no  Does the patient measure his/her own blood pressure or blood glucose at home?  yes   Does the patient have any problems obtaining medications due to transportation or finances?   no  Understanding of regimen: excellent Understanding of indications: excellent Potential of compliance: excellent    Nurse comments: Joshua Hahn is taking his medications as prescribed and has a good understanding of what his medications are for. [name] checks his blood pressures twice a day.    [name] [facility] RN 10/17/2023 11:58 AM

## 2023-10-17 NOTE — Progress Notes (Signed)
 Cardiac Individual Treatment Plan  Patient Details  Name: Joshua Hahn MRN: [id number] Date of Birth: 1946/01/10 Referring Provider:   Flowsheet Row INTENSIVE CARDIAC REHAB ORIENT from 10/17/2023 in [facility]  Referring Provider Dr. Arnoldo Lapping MD    Initial Encounter Date:  Flowsheet Row INTENSIVE CARDIAC REHAB ORIENT from 10/17/2023 in [facility]  Date 10/17/23    Visit Diagnosis: 09/20/23 S/P DES CFX  Patient's Home Medications on Admission:  Current Outpatient Medications:    amLODipine  (NORVASC ) 5 MG tablet, Take 1.5 tablets (7.5 mg total) by mouth daily., Disp: 90 tablet, Rfl: 3   Ascorbic Acid  (VITAMIN C  PO), Take 1 tablet by mouth daily., Disp: , Rfl:    aspirin  81 MG tablet, Take 81 mg by mouth daily., Disp: , Rfl:    cholecalciferol  (VITAMIN D ) 1000 UNITS tablet, Take 1,000 Units by mouth daily., Disp: , Rfl:    clopidogrel  (PLAVIX ) 75 MG tablet, TAKE 1 TABLET BY MOUTH EVERY DAY, Disp: 90 tablet, Rfl: 3   Coenzyme Q10 (CO Q 10) 100 MG CAPS, Take 300 mg by mouth every evening., Disp: , Rfl:    dorzolamide (TRUSOPT) 2 % ophthalmic solution, Place 1 drop into both eyes daily., Disp: , Rfl:    ezetimibe  (ZETIA ) 10 MG tablet, TAKE 1 TABLET BY MOUTH EVERY DAY, Disp: 90 tablet, Rfl: 2   isosorbide  mononitrate (IMDUR ) 120 MG 24 hr tablet, Take 1 tablet (120 mg total) by mouth daily., Disp: 90 tablet, Rfl: 3   metoprolol  tartrate (LOPRESSOR ) 25 MG tablet, TAKE ONE TABLET IN THE MORNING AND HALF TABLET IN THE EVENING., Disp: 135 tablet, Rfl: 3   nitroGLYCERIN  (NITROSTAT ) 0.4 MG SL tablet, Place 1 tablet (0.4 mg total) under the tongue every 5 (five) minutes as needed for chest pain., Disp: 25 tablet, Rfl: 6   omega-3 acid ethyl esters (LOVAZA ) 1 g capsule, TAKE 1 CAPSULE BY MOUTH 2 TIMES DAILY., Disp: 180 capsule, Rfl: 2   ranolazine  (RANEXA ) 1000 MG SR tablet,  Take 1 tablet (1,000 mg total) by mouth 2 (two) times daily., Disp: 60 tablet, Rfl: 5   rosuvastatin  (CRESTOR ) 40 MG tablet, Take 1 tablet (40 mg total) by mouth daily. Please keep scheduled appointment, Disp: 90 tablet, Rfl: 3   valsartan  (DIOVAN ) 80 MG tablet, Take 1 tablet (80 mg total) by mouth daily., Disp: 30 tablet, Rfl: 11   vitamin B-12 (CYANOCOBALAMIN ) 100 MCG tablet, Take 100 mcg by mouth daily. , Disp: , Rfl:    sodium zirconium cyclosilicate  (LOKELMA ) 10 g PACK packet, Take 10 g (1 packet total) by mouth as needed per instruction by provider based on blood work.. 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 levels LDL IIIa+b% and HDL2b%   Hypertension    Myocardial infarct [facility])    Myocardial infarction (HCC) 1994   inferior wall MI at [facility]   Prostate cancer [facility])    Sleep apnea  uses CPAP PRN   Sleep apnea, obstructive    using C-PAP    Tobacco Use: Social History   Tobacco Use  Smoking Status Former   Current packs/day: 0.00   Types: Cigarettes   Quit date: 05/07/1973   Years since quitting: 50.4   Passive exposure: Never  Smokeless Tobacco Never    Labs: Review Flowsheet  More data exists      Latest Ref Rng & Units 04/28/2020 11/29/2020 08/16/2022 04/02/2023 09/10/2023  Labs for ITP Cardiac and Pulmonary Rehab  Cholestrol 100 - 199 mg/dL 045  409  811  914  782   LDL (calc) 0 - 99 mg/dL 87  54  56  63  61   HDL-C >39 mg/dL 48  39  39  40  43   Trlycerides 0 - 149 mg/dL 95  956  213  086  578   Hemoglobin  A1c 4.8 - 5.6 % 7.0  - - - -    Capillary Blood Glucose: No results found for: GLUCAP   Exercise Target Goals: Exercise Program Goal: Individual exercise prescription set using results from initial 6 min walk test and THRR while considering  patient's activity barriers and safety.   Exercise Prescription Goal: Initial exercise prescription builds to 30-45 minutes a day of aerobic activity, 2-3 days per week.  Home exercise guidelines will be given to patient during program as part of exercise prescription that the participant will acknowledge.  Activity Barriers & Risk Stratification:  Activity Barriers & Cardiac Risk Stratification - 10/17/23 1521       Activity Barriers & Cardiac Risk Stratification   Activity Barriers None    Cardiac Risk Stratification High   Under 5 MET's         6 Minute Walk:  6 Minute Walk     Row Name 10/17/23 1514         6 Minute Walk   Phase Initial     Distance 1575 feet     Walk Time 6 minutes     # of Rest Breaks 0     MPH 2.98     METS 2.67     RPE 9     Perceived Dyspnea  0     VO2 Peak 9.35     Symptoms Yes (comment)     Comments PAC's, asymptomatic     Resting HR 60 bpm     Resting BP 118/74     Resting Oxygen Saturation  98 %     Exercise Oxygen Saturation  during 6 min walk 99 %     Max Ex. HR 71 bpm     Max Ex. BP 150/56     2 Minute Post BP 128/60        Oxygen Initial Assessment:   Oxygen Re-Evaluation:   Oxygen Discharge (Final Oxygen Re-Evaluation):   Initial Exercise Prescription:  Initial Exercise Prescription - 10/17/23 1500       Date of Initial Exercise RX and Referring Provider   Date 10/17/23    Referring Provider Dr. Arnoldo Lapping MD    Expected Discharge Date 01/08/24      Bike   Level 1    Watts 40    Minutes 15    METs 2.5      NuStep   Level 1    SPM 75    Minutes 15    METs 1.8      Prescription Details   Frequency (times per week) 3  Duration Progress to 30 minutes of  continuous aerobic without signs/symptoms of physical distress      Intensity   THRR 40-80% of Max Heartrate 57-114    Ratings of Perceived Exertion 11-13    Perceived Dyspnea 0-4      Progression   Progression Continue progressive overload as per policy without signs/symptoms or physical distress.      Resistance Training   Training Prescription Yes    Weight 4    Reps 10-15          Perform Capillary Blood Glucose checks as needed.  Exercise Prescription Changes:   Exercise Comments:   Exercise Goals and Review:   Exercise Goals     Row Name 10/17/23 0946             Exercise Goals   Increase Physical Activity Yes       Intervention Provide advice, education, support and counseling about physical activity/exercise needs.;Develop an individualized exercise prescription for aerobic and resistive training based on initial evaluation findings, risk stratification, comorbidities and participant's personal goals.       Expected Outcomes Short Term: Attend rehab on a regular basis to increase amount of physical activity.;Long Term: Exercising regularly at least 3-5 days a week.;Long Term: Add in home exercise to make exercise part of routine and to increase amount of physical activity.       Increase Strength and Stamina Yes       Intervention Provide advice, education, support and counseling about physical activity/exercise needs.;Develop an individualized exercise prescription for aerobic and resistive training based on initial evaluation findings, risk stratification, comorbidities and participant's personal goals.       Expected Outcomes Short Term: Increase workloads from initial exercise prescription for resistance, speed, and METs.;Short Term: Perform resistance training exercises routinely during rehab and add in resistance training at home;Long Term: Improve cardiorespiratory fitness, muscular endurance and strength as measured by increased METs and functional capacity  ( )       Able to understand and use rate of perceived exertion (RPE) scale Yes       Intervention Provide education and explanation on how to use RPE scale       Expected Outcomes Short Term: Able to use RPE daily in rehab to express subjective intensity level;Long Term:  Able to use RPE to guide intensity level when exercising independently       Knowledge and understanding of Target Heart Rate Range (THRR) Yes       Intervention Provide education and explanation of THRR including how the numbers were predicted and where they are located for reference       Expected Outcomes Short Term: Able to state/look up THRR;Short Term: Able to use daily as guideline for intensity in rehab;Long Term: Able to use THRR to govern intensity when exercising independently       Understanding of Exercise Prescription Yes       Intervention Provide education, explanation, and written materials on patient's individual exercise prescription       Expected Outcomes Short Term: Able to explain program exercise prescription;Long Term: Able to explain home exercise prescription to exercise independently          Exercise Goals Re-Evaluation :   Discharge Exercise Prescription (Final Exercise Prescription Changes):   Nutrition:  Target Goals: Understanding of nutrition guidelines, daily intake of sodium 1500mg , cholesterol 200mg , calories 30% from fat and 7% or less from saturated fats, daily to have 5 or more servings of fruits and  vegetables.  Biometrics:  Pre Biometrics - 10/17/23 1510       Pre Biometrics   Waist Circumference 40.5 inches    Hip Circumference 40 inches    Waist to Hip Ratio 1.01 %    Triceps Skinfold 19 mm    % Body Fat 29.6 %    Grip Strength 37 kg    Flexibility --   unable to reach   Single Leg Stand 30 seconds           Nutrition Therapy Plan and Nutrition Goals:   Nutrition Assessments:  MEDIFICTS Score Key: >=70 Need to make dietary changes  40-70 Heart Healthy  Diet <= 40 Therapeutic Level Cholesterol Diet    Picture Your Plate Scores: <60 Unhealthy dietary pattern with much room for improvement. 41-50 Dietary pattern unlikely to meet recommendations for good health and room for improvement. 51-60 More healthful dietary pattern, with some room for improvement.  >60 Healthy dietary pattern, although there may be some specific behaviors that could be improved.    Nutrition Goals Re-Evaluation:   Nutrition Goals Re-Evaluation:   Nutrition Goals Discharge (Final Nutrition Goals Re-Evaluation):   Psychosocial: Target Goals: Acknowledge presence or absence of significant depression and/or stress, maximize coping skills, provide positive support system. Participant is able to verbalize types and ability to use techniques and skills needed for reducing stress and depression.  Initial Review & Psychosocial Screening:  Initial Psych Review & Screening - 10/17/23 1331       Initial Review   Current issues with Current Stress Concerns    Source of Stress Concerns Family    Comments One of Mr Brook's  brother's recently passed away dealing with his estate      Family Dynamics   Good Support System? Yes   Mr Hildebran has his wife and children for support and extended family     Barriers   Psychosocial barriers to participate in program There are no identifiable barriers or psychosocial needs.      Screening Interventions   Interventions Encouraged to exercise          Quality of Life Scores:  Quality of Life - 10/17/23 1525       Quality of Life   Select Quality of Life      Quality of Life Scores   Health/Function Pre 27.83 %    Socioeconomic Pre 28.07 %    Psych/Spiritual Pre 29.14 %    Family Pre 30 %    GLOBAL Pre 28.47 %         Scores of 19 and below usually indicate a poorer quality of life in these areas.  A difference of  2-3 points is a clinically meaningful difference.  A difference of 2-3 points in the total score  of the Quality of Life Index has been associated with significant improvement in overall quality of life, self-image, physical symptoms, and general health in studies assessing change in quality of life.  PHQ-9: Review Flowsheet  More data exists      10/17/2023 04/11/2023 04/05/2022 03/23/2021 06/14/2020  Depression screen PHQ 2/9  Decreased Interest 0 0 0 0 0  Down, Depressed, Hopeless 0 0 0 0 0  PHQ - 2 Score 0 0 0 0 0  Altered sleeping 0 - - - -  Tired, decreased energy 1 - - - -  Change in appetite 0 - - - -  Feeling bad or failure about yourself  0 - - - -  Trouble concentrating  0 - - - -  Moving slowly or fidgety/restless 0 - - - -  Suicidal thoughts 0 - - - -  PHQ-9 Score 1 - - - -  Difficult doing work/chores Not difficult at all - - - -   Interpretation of Total Score  Total Score Depression Severity:  1-4 = Minimal depression, 5-9 = Mild depression, 10-14 = Moderate depression, 15-19 = Moderately severe depression, 20-27 = Severe depression   Psychosocial Evaluation and Intervention:   Psychosocial Re-Evaluation:   Psychosocial Discharge (Final Psychosocial Re-Evaluation):   Vocational Rehabilitation: Provide vocational rehab assistance to qualifying candidates.   Vocational Rehab Evaluation & Intervention:  Vocational Rehab - 10/17/23 1336       Initial Vocational Rehab Evaluation & Intervention   Assessment shows need for Vocational Rehabilitation No   Mr Whiteford is semi retired and does not need vocational rehab at this time         Education: Education Goals: Education classes will be provided on a weekly basis, covering required topics. Participant will state understanding/return demonstration of topics presented.     Core Videos: Exercise    Move It!  Clinical staff conducted group or individual video education with verbal and written material and guidebook.  Patient learns the recommended Pritikin exercise program. Exercise with the goal of living  a long, healthy life. Some of the health benefits of exercise include controlled diabetes, healthier blood pressure levels, improved cholesterol levels, improved heart and lung capacity, improved sleep, and better body composition. Everyone should speak with their doctor before starting or changing an exercise routine.  Biomechanical Limitations Clinical staff conducted group or individual video education with verbal and written material and guidebook.  Patient learns how biomechanical limitations can impact exercise and how we can mitigate and possibly overcome limitations to have an impactful and balanced exercise routine.  Body Composition Clinical staff conducted group or individual video education with verbal and written material and guidebook.  Patient learns that body composition (ratio of muscle mass to fat mass) is a key component to assessing overall fitness, rather than body weight alone. Increased fat mass, especially visceral belly fat, can put us  at increased risk for metabolic syndrome, type 2 diabetes, heart disease, and even death. It is recommended to combine diet and exercise (cardiovascular and resistance training) to improve your body composition. Seek guidance from your physician and exercise physiologist before implementing an exercise routine.  Exercise Action Plan Clinical staff conducted group or individual video education with verbal and written material and guidebook.  Patient learns the recommended strategies to achieve and enjoy long-term exercise adherence, including variety, self-motivation, self-efficacy, and positive decision making. Benefits of exercise include fitness, good health, weight management, more energy, better sleep, less stress, and overall well-being.  Medical   Heart Disease Risk Reduction Clinical staff conducted group or individual video education with verbal and written material and guidebook.  Patient learns our heart is our most vital organ as it  circulates oxygen, nutrients, white blood cells, and hormones throughout the entire body, and carries waste away. Data supports a plant-based eating plan like the Pritikin Program for its effectiveness in slowing progression of and reversing heart disease. The video provides a number of recommendations to address heart disease.   Metabolic Syndrome and Belly Fat  Clinical staff conducted group or individual video education with verbal and written material and guidebook.  Patient learns what metabolic syndrome is, how it leads to heart disease, and how one can reverse it and  keep it from coming back. You have metabolic syndrome if you have 3 of the following 5 criteria: abdominal obesity, high blood pressure, high triglycerides, low HDL cholesterol, and high blood sugar.  Hypertension and Heart Disease Clinical staff conducted group or individual video education with verbal and written material and guidebook.  Patient learns that high blood pressure, or hypertension, is very common in the [place] . Hypertension is largely due to excessive salt intake, but other important risk factors include being overweight, physical inactivity, drinking too much alcohol, smoking, and not eating enough potassium from fruits and vegetables. High blood pressure is a leading risk factor for heart attack, stroke, congestive heart failure, dementia, kidney failure, and premature death. Long-term effects of excessive salt intake include stiffening of the arteries and thickening of heart muscle and organ damage. Recommendations include ways to reduce hypertension and the risk of heart disease.  Diseases of Our Time - Focusing on Diabetes Clinical staff conducted group or individual video education with verbal and written material and guidebook.  Patient learns why the best way to stop diseases of our time is prevention, through food and other lifestyle changes. Medicine (such as prescription pills and surgeries) is often  only a Band-Aid on the problem, not a long-term solution. Most common diseases of our time include obesity, type 2 diabetes, hypertension, heart disease, and cancer. The Pritikin Program is recommended and has been proven to help reduce, reverse, and/or prevent the damaging effects of metabolic syndrome.  Nutrition   Overview of the Pritikin Eating Plan  Clinical staff conducted group or individual video education with verbal and written material and guidebook.  Patient learns about the Pritikin Eating Plan for disease risk reduction. The Pritikin Eating Plan emphasizes a wide variety of unrefined, minimally-processed carbohydrates, like fruits, vegetables, whole grains, and legumes. Go, Caution, and Stop food choices are explained. Plant-based and lean animal proteins are emphasized. Rationale provided for low sodium intake for blood pressure control, low added sugars for blood sugar stabilization, and low added fats and oils for coronary artery disease risk reduction and weight management.  Calorie Density  Clinical staff conducted group or individual video education with verbal and written material and guidebook.  Patient learns about calorie density and how it impacts the Pritikin Eating Plan. Knowing the characteristics of the food you choose will help you decide whether those foods will lead to weight gain or weight loss, and whether you want to consume more or less of them. Weight loss is usually a side effect of the Pritikin Eating Plan because of its focus on low calorie-dense foods.  Label Reading  Clinical staff conducted group or individual video education with verbal and written material and guidebook.  Patient learns about the Pritikin recommended label reading guidelines and corresponding recommendations regarding calorie density, added sugars, sodium content, and whole grains.  Dining Out - Part 1  Clinical staff conducted group or individual video education with verbal and written  material and guidebook.  Patient learns that restaurant meals can be sabotaging because they can be so high in calories, fat, sodium, and/or sugar. Patient learns recommended strategies on how to positively address this and avoid unhealthy pitfalls.  Facts on Fats  Clinical staff conducted group or individual video education with verbal and written material and guidebook.  Patient learns that lifestyle modifications can be just as effective, if not more so, as many medications for lowering your risk of heart disease. A Pritikin lifestyle can help to reduce your risk of  inflammation and atherosclerosis (cholesterol build-up, or plaque, in the artery walls). Lifestyle interventions such as dietary choices and physical activity address the cause of atherosclerosis. A review of the types of fats and their impact on blood cholesterol levels, along with dietary recommendations to reduce fat intake is also included.  Nutrition Action Plan  Clinical staff conducted group or individual video education with verbal and written material and guidebook.  Patient learns how to incorporate Pritikin recommendations into their lifestyle. Recommendations include planning and keeping personal health goals in mind as an important part of their success.  Healthy Mind-Set    Healthy Minds, Bodies, Hearts  Clinical staff conducted group or individual video education with verbal and written material and guidebook.  Patient learns how to identify when they are stressed. Video will discuss the impact of that stress, as well as the many benefits of stress management. Patient will also be introduced to stress management techniques. The way we think, act, and feel has an impact on our hearts.  How Our Thoughts Can Heal Our Hearts  Clinical staff conducted group or individual video education with verbal and written material and guidebook.  Patient learns that negative thoughts can cause depression and anxiety. This can result in  negative lifestyle behavior and serious health problems. Cognitive behavioral therapy is an effective method to help control our thoughts in order to change and improve our emotional outlook.  Additional Videos:  Exercise    Improving Performance  Clinical staff conducted group or individual video education with verbal and written material and guidebook.  Patient learns to use a non-linear approach by alternating intensity levels and lengths of time spent exercising to help burn more calories and lose more body fat. Cardiovascular exercise helps improve heart health, metabolism, hormonal balance, blood sugar control, and recovery from fatigue. Resistance training improves strength, endurance, balance, coordination, reaction time, metabolism, and muscle mass. Flexibility exercise improves circulation, posture, and balance. Seek guidance from your physician and exercise physiologist before implementing an exercise routine and learn your capabilities and proper form for all exercise.  Introduction to Yoga  Clinical staff conducted group or individual video education with verbal and written material and guidebook.  Patient learns about yoga, a discipline of the coming together of mind, breath, and body. The benefits of yoga include improved flexibility, improved range of motion, better posture and core strength, increased lung function, weight loss, and positive self-image. Yoga's heart health benefits include lowered blood pressure, healthier heart rate, decreased cholesterol and triglyceride levels, improved immune function, and reduced stress. Seek guidance from your physician and exercise physiologist before implementing an exercise routine and learn your capabilities and proper form for all exercise.  Medical   Aging: Enhancing Your Quality of Life  Clinical staff conducted group or individual video education with verbal and written material and guidebook.  Patient learns key strategies and  recommendations to stay in good physical health and enhance quality of life, such as prevention strategies, having an advocate, securing a Health Care Proxy and Power of Attorney, and keeping a list of medications and system for tracking them. It also discusses how to avoid risk for bone loss.  Biology of Weight Control  Clinical staff conducted group or individual video education with verbal and written material and guidebook.  Patient learns that weight gain occurs because we consume more calories than we burn (eating more, moving less). Even if your body weight is normal, you may have higher ratios of fat compared to muscle mass. Too  much body fat puts you at increased risk for cardiovascular disease, heart attack, stroke, type 2 diabetes, and obesity-related cancers. In addition to exercise, following the Pritikin Eating Plan can help reduce your risk.  Decoding Lab Results  Clinical staff conducted group or individual video education with verbal and written material and guidebook.  Patient learns that lab test reflects one measurement whose values change over time and are influenced by many factors, including medication, stress, sleep, exercise, food, hydration, pre-existing medical conditions, and more. It is recommended to use the knowledge from this video to become more involved with your lab results and evaluate your numbers to speak with your doctor.   Diseases of Our Time - Overview  Clinical staff conducted group or individual video education with verbal and written material and guidebook.  Patient learns that according to the CDC, 50% to 70% of chronic diseases (such as obesity, type 2 diabetes, elevated lipids, hypertension, and heart disease) are avoidable through lifestyle improvements including healthier food choices, listening to satiety cues, and increased physical activity.  Sleep Disorders Clinical staff conducted group or individual video education with verbal and written  material and guidebook.  Patient learns how good quality and duration of sleep are important to overall health and well-being. Patient also learns about sleep disorders and how they impact health along with recommendations to address them, including discussing with a physician.  Nutrition  Dining Out - Part 2 Clinical staff conducted group or individual video education with verbal and written material and guidebook.  Patient learns how to plan ahead and communicate in order to maximize their dining experience in a healthy and nutritious manner. Included are recommended food choices based on the type of restaurant the patient is visiting.   Fueling a Banker conducted group or individual video education with verbal and written material and guidebook.  There is a strong connection between our food choices and our health. Diseases like obesity and type 2 diabetes are very prevalent and are in large-part due to lifestyle choices. The Pritikin Eating Plan provides plenty of food and hunger-curbing satisfaction. It is easy to follow, affordable, and helps reduce health risks.  Menu Workshop  Clinical staff conducted group or individual video education with verbal and written material and guidebook.  Patient learns that restaurant meals can sabotage health goals because they are often packed with calories, fat, sodium, and sugar. Recommendations include strategies to plan ahead and to communicate with the manager, chef, or server to help order a healthier meal.  Planning Your Eating Strategy  Clinical staff conducted group or individual video education with verbal and written material and guidebook.  Patient learns about the Pritikin Eating Plan and its benefit of reducing the risk of disease. The Pritikin Eating Plan does not focus on calories. Instead, it emphasizes high-quality, nutrient-rich foods. By knowing the characteristics of the foods, we choose, we can determine their  calorie density and make informed decisions.  Targeting Your Nutrition Priorities  Clinical staff conducted group or individual video education with verbal and written material and guidebook.  Patient learns that lifestyle habits have a tremendous impact on disease risk and progression. This video provides eating and physical activity recommendations based on your personal health goals, such as reducing LDL cholesterol, losing weight, preventing or controlling type 2 diabetes, and reducing high blood pressure.  Vitamins and Minerals  Clinical staff conducted group or individual video education with verbal and written material and guidebook.  Patient learns different ways  to obtain key vitamins and minerals, including through a recommended healthy diet. It is important to discuss all supplements you take with your doctor.   Healthy Mind-Set    Smoking Cessation  Clinical staff conducted group or individual video education with verbal and written material and guidebook.  Patient learns that cigarette smoking and tobacco addiction pose a serious health risk which affects millions of people. Stopping smoking will significantly reduce the risk of heart disease, lung disease, and many forms of cancer. Recommended strategies for quitting are covered, including working with your doctor to develop a successful plan.  Culinary   Becoming a Set designer conducted group or individual video education with verbal and written material and guidebook.  Patient learns that cooking at home can be healthy, cost-effective, quick, and puts them in control. Keys to cooking healthy recipes will include looking at your recipe, assessing your equipment needs, planning ahead, making it simple, choosing cost-effective seasonal ingredients, and limiting the use of added fats, salts, and sugars.  Cooking - Breakfast and Snacks  Clinical staff conducted group or individual video education with verbal and  written material and guidebook.  Patient learns how important breakfast is to satiety and nutrition through the entire day. Recommendations include key foods to eat during breakfast to help stabilize blood sugar levels and to prevent overeating at meals later in the day. Planning ahead is also a key component.  Cooking - Educational psychologist conducted group or individual video education with verbal and written material and guidebook.  Patient learns eating strategies to improve overall health, including an approach to cook more at home. Recommendations include thinking of animal protein as a side on your plate rather than center stage and focusing instead on lower calorie dense options like vegetables, fruits, whole grains, and plant-based proteins, such as beans. Making sauces in large quantities to freeze for later and leaving the skin on your vegetables are also recommended to maximize your experience.  Cooking - Healthy Salads and Dressing Clinical staff conducted group or individual video education with verbal and written material and guidebook.  Patient learns that vegetables, fruits, whole grains, and legumes are the foundations of the Pritikin Eating Plan. Recommendations include how to incorporate each of these in flavorful and healthy salads, and how to create homemade salad dressings. Proper handling of ingredients is also covered. Cooking - Soups and [employer] - Soups and Desserts Clinical staff conducted group or individual video education with verbal and written material and guidebook.  Patient learns that Pritikin soups and desserts make for easy, nutritious, and delicious snacks and meal components that are low in sodium, fat, sugar, and calorie density, while high in vitamins, minerals, and filling fiber. Recommendations include simple and healthy ideas for soups and desserts.   Overview     The Pritikin Solution Program Overview Clinical staff conducted group or  individual video education with verbal and written material and guidebook.  Patient learns that the results of the Pritikin Program have been documented in more than 100 articles published in peer-reviewed journals, and the benefits include reducing risk factors for (and, in some cases, even reversing) high cholesterol, high blood pressure, type 2 diabetes, obesity, and more! An overview of the three key pillars of the Pritikin Program will be covered: eating well, doing regular exercise, and having a healthy mind-set.  WORKSHOPS  Exercise: Exercise Basics: Building Your Action Plan Clinical staff led group instruction and group discussion with PowerPoint  presentation and patient guidebook. To enhance the learning environment the use of posters, models and videos may be added. At the conclusion of this workshop, patients will comprehend the difference between physical activity and exercise, as well as the benefits of incorporating both, into their routine. Patients will understand the FITT (Frequency, Intensity, Time, and Type) principle and how to use it to build an exercise action plan. In addition, safety concerns and other considerations for exercise and cardiac rehab will be addressed by the presenter. The purpose of this lesson is to promote a comprehensive and effective weekly exercise routine in order to improve patients' overall level of fitness.   Managing Heart Disease: Your Path to a Healthier Heart Clinical staff led group instruction and group discussion with PowerPoint presentation and patient guidebook. To enhance the learning environment the use of posters, models and videos may be added.At the conclusion of this workshop, patients will understand the anatomy and physiology of the heart. Additionally, they will understand how Pritikin's three pillars impact the risk factors, the progression, and the management of heart disease.  The purpose of this lesson is to provide a high-level  overview of the heart, heart disease, and how the Pritikin lifestyle positively impacts risk factors.  Exercise Biomechanics Clinical staff led group instruction and group discussion with PowerPoint presentation and patient guidebook. To enhance the learning environment the use of posters, models and videos may be added. Patients will learn how the structural parts of their bodies function and how these functions impact their daily activities, movement, and exercise. Patients will learn how to promote a neutral spine, learn how to manage pain, and identify ways to improve their physical movement in order to promote healthy living. The purpose of this lesson is to expose patients to common physical limitations that impact physical activity. Participants will learn practical ways to adapt and manage aches and pains, and to minimize their effect on regular exercise. Patients will learn how to maintain good posture while sitting, walking, and lifting.  Balance Training and Fall Prevention  Clinical staff led group instruction and group discussion with PowerPoint presentation and patient guidebook. To enhance the learning environment the use of posters, models and videos may be added. At the conclusion of this workshop, patients will understand the importance of their sensorimotor skills (vision, proprioception, and the vestibular system) in maintaining their ability to balance as they age. Patients will apply a variety of balancing exercises that are appropriate for their current level of function. Patients will understand the common causes for poor balance, possible solutions to these problems, and ways to modify their physical environment in order to minimize their fall risk. The purpose of this lesson is to teach patients about the importance of maintaining balance as they age and ways to minimize their risk of falling.  WORKSHOPS   Nutrition:  Fueling a Ship broker led group  instruction and group discussion with PowerPoint presentation and patient guidebook. To enhance the learning environment the use of posters, models and videos may be added. Patients will review the foundational principles of the Pritikin Eating Plan and understand what constitutes a serving size in each of the food groups. Patients will also learn Pritikin-friendly foods that are better choices when away from home and review make-ahead meal and snack options. Calorie density will be reviewed and applied to three nutrition priorities: weight maintenance, weight loss, and weight gain. The purpose of this lesson is to reinforce (in a group setting) the key concepts around  what patients are recommended to eat and how to apply these guidelines when away from home by planning and selecting Pritikin-friendly options. Patients will understand how calorie density may be adjusted for different weight management goals.  Mindful Eating  Clinical staff led group instruction and group discussion with PowerPoint presentation and patient guidebook. To enhance the learning environment the use of posters, models and videos may be added. Patients will briefly review the concepts of the Pritikin Eating Plan and the importance of low-calorie dense foods. The concept of mindful eating will be introduced as well as the importance of paying attention to internal hunger signals. Triggers for non-hunger eating and techniques for dealing with triggers will be explored. The purpose of this lesson is to provide patients with the opportunity to review the basic principles of the Pritikin Eating Plan, discuss the value of eating mindfully and how to measure internal cues of hunger and fullness using the Hunger Scale. Patients will also discuss reasons for non-hunger eating and learn strategies to use for controlling emotional eating.  Targeting Your Nutrition Priorities Clinical staff led group instruction and group discussion with  PowerPoint presentation and patient guidebook. To enhance the learning environment the use of posters, models and videos may be added. Patients will learn how to determine their genetic susceptibility to disease by reviewing their family history. Patients will gain insight into the importance of diet as part of an overall healthy lifestyle in mitigating the impact of genetics and other environmental insults. The purpose of this lesson is to provide patients with the opportunity to assess their personal nutrition priorities by looking at their family history, their own health history and current risk factors. Patients will also be able to discuss ways of prioritizing and modifying the Pritikin Eating Plan for their highest risk areas  Menu  Clinical staff led group instruction and group discussion with PowerPoint presentation and patient guidebook. To enhance the learning environment the use of posters, models and videos may be added. Using menus brought in from [employer], or printed from [employer], patients will apply the Pritikin dining out guidelines that were presented in the [employer] video. Patients will also be able to practice these guidelines in a variety of provided scenarios. The purpose of this lesson is to provide patients with the opportunity to practice hands-on learning of the Pritikin Dining Out guidelines with actual menus and practice scenarios.  Label Reading Clinical staff led group instruction and group discussion with PowerPoint presentation and patient guidebook. To enhance the learning environment the use of posters, models and videos may be added. Patients will review and discuss the Pritikin label reading guidelines presented in Pritikin's Label Reading Educational series video. Using fool labels brought in from local grocery stores and markets, patients will apply the label reading guidelines and determine if the packaged food meet the Pritikin  guidelines. The purpose of this lesson is to provide patients with the opportunity to review, discuss, and practice hands-on learning of the Pritikin Label Reading guidelines with actual packaged food labels. Cooking School  Pritikin's [employer] are designed to teach patients ways to prepare quick, simple, and affordable recipes at home. The importance of nutrition's role in chronic disease risk reduction is reflected in its emphasis in the overall Pritikin program. By learning how to prepare essential core Pritikin Eating Plan recipes, patients will increase control over what they eat; be able to customize the flavor of foods without the use of added salt, sugar, or  fat; and improve the quality of the food they consume. By learning a set of core recipes which are easily assembled, quickly prepared, and affordable, patients are more likely to prepare more healthy foods at home. These workshops focus on convenient breakfasts, simple entres, side dishes, and desserts which can be prepared with minimal effort and are consistent with nutrition recommendations for cardiovascular risk reduction. Cooking [employer] are taught by a Armed forces logistics/support/administrative officer (RD) who has been trained by the [employer]. The chef or RD has a clear understanding of the importance of minimizing - if not completely eliminating - added fat, sugar, and sodium in recipes. Throughout the series of Cooking School Workshop sessions, patients will learn about healthy ingredients and efficient methods of cooking to build confidence in their capability to prepare    Cooking School weekly topics:  Adding Flavor- Sodium-Free  Fast and Healthy Breakfasts  Powerhouse Plant-Based Proteins  Satisfying Salads and Dressings  Simple Sides and Sauces  International Cuisine-Spotlight on the [employer] Zones  Delicious Desserts  Savory Soups  [employer] - Meals in a Astronomer Appetizers and  Snacks  Comforting Weekend Breakfasts  One-Pot Wonders   Fast Evening Meals  Landscape architect Your Pritikin Plate  WORKSHOPS   Healthy Mindset (Psychosocial):  Focused Goals, Sustainable Changes Clinical staff led group instruction and group discussion with PowerPoint presentation and patient guidebook. To enhance the learning environment the use of posters, models and videos may be added. Patients will be able to apply effective goal setting strategies to establish at least one personal goal, and then take consistent, meaningful action toward that goal. They will learn to identify common barriers to achieving personal goals and develop strategies to overcome them. Patients will also gain an understanding of how our mind-set can impact our ability to achieve goals and the importance of cultivating a positive and growth-oriented mind-set. The purpose of this lesson is to provide patients with a deeper understanding of how to set and achieve personal goals, as well as the tools and strategies needed to overcome common obstacles which may arise along the way.  From Head to Heart: The Power of a Healthy Outlook  Clinical staff led group instruction and group discussion with PowerPoint presentation and patient guidebook. To enhance the learning environment the use of posters, models and videos may be added. Patients will be able to recognize and describe the impact of emotions and mood on physical health. They will discover the importance of self-care and explore self-care practices which may work for them. Patients will also learn how to utilize the 4 C's to cultivate a healthier outlook and better manage stress and challenges. The purpose of this lesson is to demonstrate to patients how a healthy outlook is an essential part of maintaining good health, especially as they continue their cardiac rehab journey.  Healthy Sleep for a Healthy Heart Clinical staff led group instruction and  group discussion with PowerPoint presentation and patient guidebook. To enhance the learning environment the use of posters, models and videos may be added. At the conclusion of this workshop, patients will be able to demonstrate knowledge of the importance of sleep to overall health, well-being, and quality of life. They will understand the symptoms of, and treatments for, common sleep disorders. Patients will also be able to identify daytime and nighttime behaviors which impact sleep, and they will be able to apply these tools to help manage sleep-related challenges. The purpose of this lesson  is to provide patients with a general overview of sleep and outline the importance of quality sleep. Patients will learn about a few of the most common sleep disorders. Patients will also be introduced to the concept of "sleep hygiene," and discover ways to self-manage certain sleeping problems through simple daily behavior changes. Finally, the workshop will motivate patients by clarifying the links between quality sleep and their goals of heart-healthy living.   Recognizing and Reducing Stress Clinical staff led group instruction and group discussion with PowerPoint presentation and patient guidebook. To enhance the learning environment the use of posters, models and videos may be added. At the conclusion of this workshop, patients will be able to understand the types of stress reactions, differentiate between acute and chronic stress, and recognize the impact that chronic stress has on their health. They will also be able to apply different coping mechanisms, such as reframing negative self-talk. Patients will have the opportunity to practice a variety of stress management techniques, such as deep abdominal breathing, progressive muscle relaxation, and/or guided imagery.  The purpose of this lesson is to educate patients on the role of stress in their lives and to provide healthy techniques for coping with  it.  Learning Barriers/Preferences:  Learning Barriers/Preferences - 10/17/23 1335       Learning Barriers/Preferences   Learning Barriers Sight   wears glasses   Learning Preferences Pictoral;Video          Education Topics:  Knowledge Questionnaire Score:  Knowledge Questionnaire Score - 10/17/23 1526       Knowledge Questionnaire Score   Pre Score 23/24          Core Components/Risk Factors/Patient Goals at Admission:  Personal Goals and Risk Factors at Admission - 10/17/23 1528       Core Components/Risk Factors/Patient Goals on Admission    Weight Management Yes;Weight Loss    Intervention Weight Management: Develop a combined nutrition and exercise program designed to reach desired caloric intake, while maintaining appropriate intake of nutrient and fiber, sodium and fats, and appropriate energy expenditure required for the weight goal.;Weight Management: Provide education and appropriate resources to help participant work on and attain dietary goals.    Admit Weight 210 lb 12.2 oz (95.6 kg)    Expected Outcomes Short Term: Continue to assess and modify interventions until short term weight is achieved;Long Term: Adherence to nutrition and physical activity/exercise program aimed toward attainment of established weight goal;Weight Loss: Understanding of general recommendations for a balanced deficit meal plan, which promotes 1-2 lb weight loss per week and includes a negative energy balance of [phone] kcal/d;Understanding recommendations for meals to include 15-35% energy as protein, 25-35% energy from fat, 35-60% energy from carbohydrates, less than 200mg  of dietary cholesterol, 20-35 gm of total fiber daily;Understanding of distribution of calorie intake throughout the day with the consumption of 4-5 meals/snacks    Hypertension Yes    Intervention Provide education on lifestyle modifcations including regular physical activity/exercise, weight management, moderate sodium  restriction and increased consumption of fresh fruit, vegetables, and low fat dairy, alcohol moderation, and smoking cessation.;Monitor prescription use compliance.    Expected Outcomes Short Term: Continued assessment and intervention until BP is < 140/79mm HG in hypertensive participants. < 130/37mm HG in hypertensive participants with diabetes, heart failure or chronic kidney disease.;Long Term: Maintenance of blood pressure at goal levels.    Lipids Yes    Intervention Provide education and support for participant on nutrition & aerobic/resistive exercise along with prescribed medications to  achieve LDL 70mg , HDL >40mg .    Expected Outcomes Short Term: Participant states understanding of desired cholesterol values and is compliant with medications prescribed. Participant is following exercise prescription and nutrition guidelines.;Long Term: Cholesterol controlled with medications as prescribed, with individualized exercise RX and with personalized nutrition plan. Value goals: LDL < 70mg , HDL > 40 mg.    Stress Yes    Intervention Offer individual and/or small group education and counseling on adjustment to heart disease, stress management and health-related lifestyle change. Teach and support self-help strategies.;Refer participants experiencing significant psychosocial distress to appropriate mental health specialists for further evaluation and treatment. When possible, include family members and significant others in education/counseling sessions.    Expected Outcomes Short Term: Participant demonstrates changes in health-related behavior, relaxation and other stress management skills, ability to obtain effective social support, and compliance with psychotropic medications if prescribed.;Long Term: Emotional wellbeing is indicated by absence of clinically significant psychosocial distress or social isolation.    Personal Goal Other Yes    Personal Goal Have the mind relax more, wants waist size at  36 inches, endurance    Intervention Will continue to monitor pt and progress workloads as tolerated without sign or symptom    Expected Outcomes Pt will achieve his goals          Core Components/Risk Factors/Patient Goals Review:    Core Components/Risk Factors/Patient Goals at Discharge (Final Review):    ITP Comments:  ITP Comments     Row Name 10/17/23 0945           ITP Comments Gaylyn Keas, MD: Medical Director.  Intorduction to the [facility]/ Intensive Caridac Rehab.  Initial orientation packet reviewed with the patient.          Comments: Participant attended orientation for the cardiac rehabilitation program on  10/17/2023  to perform initial intake and exercise walk test. Patient introduced to the [facility] education and orientation packet was reviewed. Completed 6-minute walk test, measurements, initial ITP, and exercise prescription. Vital signs stable. Telemetry-normal sinus rhythm occasional PAC's, asymptomatic.Monte Antonio RN BSN   Service time was from [phone] to 1120.

## 2023-10-21 ENCOUNTER — Encounter (HOSPITAL_COMMUNITY)
Admission: RE | Admit: 2023-10-21 | Discharge: 2023-10-21 | Disposition: A | Discharge status: Home / Self Care | Source: Ambulatory Visit | Attending: Cardiovascular Disease

## 2023-10-21 DIAGNOSIS — Z955 Presence of coronary angioplasty implant and graft: Secondary | ICD-10-CM

## 2023-10-21 DIAGNOSIS — Z45812 Encounter for adjustment or removal of left breast implant: Secondary | ICD-10-CM | POA: Diagnosis not present

## 2023-10-21 NOTE — Progress Notes (Signed)
 Daily Session Note  Patient Details  Name: Joshua Hahn MRN: [id number] Date of Birth: 1945-06-10 Referring Provider:   Flowsheet Row INTENSIVE CARDIAC REHAB ORIENT from 10/17/2023 in [facility]  Referring Provider Dr. Arnoldo Lapping MD    Encounter Date: 10/21/2023  Check In:  Session Check In - 10/21/23 0701       Check-In   Supervising physician immediately available to respond to emergencies [facility] MD immediately available    Physician(s) Morey Ar, NP    Location [facility]    Staff Present Pablo Boards BS, ACSM-CEP, Exercise Physiologist;Kaylee Nolon Baxter, MS, ACSM-CEP, Exercise Physiologist;Bailey Martina Sledge, MS, Exercise Physiologist;Johnny Alexia Angelucci, MS, Exercise Physiologist    Virtual Visit No    Medication changes reported     No    Fall or balance concerns reported    No    Tobacco Cessation No Change    Warm-up and Cool-down Performed as group-led instruction    Resistance Training Performed Yes    VAD Patient? No    PAD/SET Patient? No      Pain Assessment   Currently in Pain? No/denies    Pain Score 0-No pain    Multiple Pain Sites No          Capillary Blood Glucose: No results found for this or any previous visit (from the past 24 hours).    Social History   Tobacco Use  Smoking Status Former   Current packs/day: 0.00   Types: Cigarettes   Quit date: 05/07/1973   Years since quitting: 50.4   Passive exposure: Never  Smokeless Tobacco Never    Goals Met:  Independence with exercise equipment Exercise tolerated well No report of concerns or symptoms today Strength training completed today  Goals Unmet:  Not Applicable  Comments: Pt participated in group exercise cardiac rehab today. Pt tolerated light exercise without difficulty. VSS, telemetry-Sinus Rhythm with 1st degree AV block, asymptomatic. Medication list reconciled at first day orientation last week with no  changes to report today. PSYCHOSOCIAL ASSESSMENT:  PHQ9=1. Pt exhibits positive coping skills, hopeful outlook with supportive family. No psychosocial needs identified at this time, no psychosocial interventions necessary. Patient oriented to exercise equipment and  gym routine. Understanding verbalized.    Dr. Gaylyn Keas is Medical Director for Cardiac Rehab at [facility].

## 2023-10-21 NOTE — Progress Notes (Signed)
 Cardiac Individual Treatment Plan  Patient Details  Name: Joshua Hahn MRN: [id number] Date of Birth: May 21, 1945 Referring Provider:   Flowsheet Row INTENSIVE CARDIAC REHAB ORIENT from 10/17/2023 in [facility] for Heart, Vascular, & Lung [facility]  Referring Provider Dr. Arnoldo Lapping MD    Initial Encounter Date:  Flowsheet Row INTENSIVE CARDIAC REHAB ORIENT from 10/17/2023 in [facility] for Heart, Vascular, & Lung Health  Date 10/17/23    Visit Diagnosis: 09/20/23 S/P DES CFX  Patient's Home Medications on Admission:  Current Outpatient Medications:    amLODipine  (NORVASC ) 5 MG tablet, Take 1.5 tablets (7.5 mg total) by mouth daily., Disp: 90 tablet, Rfl: 3   Ascorbic Acid  (VITAMIN C  PO), Take 1 tablet by mouth daily., Disp: , Rfl:    aspirin  81 MG tablet, Take 81 mg by mouth daily., Disp: , Rfl:    cholecalciferol  (VITAMIN D ) 1000 UNITS tablet, Take 1,000 Units by mouth daily., Disp: , Rfl:    clopidogrel  (PLAVIX ) 75 MG tablet, TAKE 1 TABLET BY MOUTH EVERY DAY, Disp: 90 tablet, Rfl: 3   Coenzyme Q10 (CO Q 10) 100 MG CAPS, Take 300 mg by mouth every evening., Disp: , Rfl:    dorzolamide (TRUSOPT) 2 % ophthalmic solution, Place 1 drop into both eyes daily., Disp: , Rfl:    ezetimibe  (ZETIA ) 10 MG tablet, TAKE 1 TABLET BY MOUTH EVERY DAY, Disp: 90 tablet, Rfl: 2   isosorbide  mononitrate (IMDUR ) 120 MG 24 hr tablet, Take 1 tablet (120 mg total) by mouth daily., Disp: 90 tablet, Rfl: 3   metoprolol  tartrate (LOPRESSOR ) 25 MG tablet, TAKE ONE TABLET IN THE MORNING AND HALF TABLET IN THE EVENING., Disp: 135 tablet, Rfl: 3   nitroGLYCERIN  (NITROSTAT ) 0.4 MG SL tablet, Place 1 tablet (0.4 mg total) under the tongue every 5 (five) minutes as needed for chest pain., Disp: 25 tablet, Rfl: 6   omega-3 acid ethyl esters (LOVAZA ) 1 g capsule, TAKE 1 CAPSULE BY MOUTH 2 TIMES DAILY., Disp: 180 capsule, Rfl: 2   ranolazine  (RANEXA ) 1000 MG SR tablet,  Take 1 tablet (1,000 mg total) by mouth 2 (two) times daily., Disp: 60 tablet, Rfl: 5   rosuvastatin  (CRESTOR ) 40 MG tablet, Take 1 tablet (40 mg total) by mouth daily. Please keep scheduled appointment, Disp: 90 tablet, Rfl: 3   sodium zirconium cyclosilicate  (LOKELMA ) 10 g PACK packet, Take 10 g (1 packet total) by mouth as needed per instruction by provider based on blood work.. (Patient not taking: Reported on 10/17/2023), Disp: 7 packet, Rfl: 0   valsartan  (DIOVAN ) 80 MG tablet, Take 1 tablet (80 mg total) by mouth daily., Disp: 30 tablet, Rfl: 11   vitamin B-12 (CYANOCOBALAMIN ) 100 MCG tablet, Take 100 mcg by mouth daily. 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 levels LDL IIIa+b% and HDL2b%   Hypertension    Myocardial infarct [facility])    Myocardial infarction (HCC) 1994   inferior wall MI at [facility] hospitaland CABG   Prostate cancer [facility])    Sleep apnea  uses CPAP PRN   Sleep apnea, obstructive    using C-PAP    Tobacco Use: Social History   Tobacco Use  Smoking Status Former   Current packs/day: 0.00   Types: Cigarettes   Quit date: 05/07/1973   Years since quitting: 50.4   Passive exposure: Never  Smokeless Tobacco Never    Labs: Review Flowsheet  More data exists      Latest Ref Rng & Units 04/28/2020 11/29/2020 08/16/2022 04/02/2023 09/10/2023  Labs for ITP Cardiac and Pulmonary Rehab  Cholestrol 100 - 199 mg/dL 161  096  045  409  811   LDL (calc) 0 - 99 mg/dL 87  54  56  63  61   HDL-C >39 mg/dL 48  39  39  40  43   Trlycerides 0 - 149 mg/dL 95  914  782  956  213   Hemoglobin  A1c 4.8 - 5.6 % 7.0  - - - -    Capillary Blood Glucose: No results found for: GLUCAP   Exercise Target Goals: Exercise Program Goal: Individual exercise prescription set using results from initial 6 min walk test and THRR while considering  patient's activity barriers and safety.   Exercise Prescription Goal: Initial exercise prescription builds to 30-45 minutes a day of aerobic activity, 2-3 days per week.  Home exercise guidelines will be given to patient during program as part of exercise prescription that the participant will acknowledge.  Activity Barriers & Risk Stratification:  Activity Barriers & Cardiac Risk Stratification - 10/17/23 1521       Activity Barriers & Cardiac Risk Stratification   Activity Barriers None    Cardiac Risk Stratification High   Under 5 MET's         6 Minute Walk:  6 Minute Walk     Row Name 10/17/23 1514         6 Minute Walk   Phase Initial     Distance 1575 feet     Walk Time 6 minutes     # of Rest Breaks 0     MPH 2.98     METS 2.67     RPE 9     Perceived Dyspnea  0     VO2 Peak 9.35     Symptoms Yes (comment)     Comments PAC's, asymptomatic     Resting HR 60 bpm     Resting BP 118/74     Resting Oxygen Saturation  98 %     Exercise Oxygen Saturation  during 6 min walk 99 %     Max Ex. HR 71 bpm     Max Ex. BP 150/56     2 Minute Post BP 128/60        Oxygen Initial Assessment:   Oxygen Re-Evaluation:   Oxygen Discharge (Final Oxygen Re-Evaluation):   Initial Exercise Prescription:  Initial Exercise Prescription - 10/17/23 1500       Date of Initial Exercise RX and Referring Provider   Date 10/17/23    Referring Provider Dr. Arnoldo Lapping MD    Expected Discharge Date 01/08/24      Bike   Level 1    Watts 40    Minutes 15    METs 2.5      NuStep   Level 1    SPM 75    Minutes 15    METs 1.8      Prescription Details   Frequency (times per week) 3  Duration Progress to 30 minutes of  continuous aerobic without signs/symptoms of physical distress      Intensity   THRR 40-80% of Max Heartrate 57-114    Ratings of Perceived Exertion 11-13    Perceived Dyspnea 0-4      Progression   Progression Continue progressive overload as per policy without signs/symptoms or physical distress.      Resistance Training   Training Prescription Yes    Weight 4    Reps 10-15          Perform Capillary Blood Glucose checks as needed.  Exercise Prescription Changes:   Exercise Comments:   Exercise Comments     Row Name 10/21/23 0939           Exercise Comments Pt first day in the Pritikin ICR program. Pt tolerated exercise well with an average MET level of 2.5. He is off to a good start and is learning his THRR, RPE and ExRx          Exercise Goals and Review:   Exercise Goals     Row Name 10/17/23 0946             Exercise Goals   Increase Physical Activity Yes       Intervention Provide advice, education, support and counseling about physical activity/exercise needs.;Develop an individualized exercise prescription for aerobic and resistive training based on initial evaluation findings, risk stratification, comorbidities and participant's personal goals.       Expected Outcomes Short Term: Attend rehab on a regular basis to increase amount of physical activity.;Long Term: Exercising regularly at least 3-5 days a week.;Long Term: Add in home exercise to make exercise part of routine and to increase amount of physical activity.       Increase Strength and Stamina Yes       Intervention Provide advice, education, support and counseling about physical activity/exercise needs.;Develop an individualized exercise prescription for aerobic and resistive training based on initial evaluation findings, risk stratification, comorbidities and participant's personal goals.       Expected Outcomes Short Term: Increase workloads from initial exercise prescription for resistance,  speed, and METs.;Short Term: Perform resistance training exercises routinely during rehab and add in resistance training at home;Long Term: Improve cardiorespiratory fitness, muscular endurance and strength as measured by increased METs and functional capacity ( )       Able to understand and use rate of perceived exertion (RPE) scale Yes       Intervention Provide education and explanation on how to use RPE scale       Expected Outcomes Short Term: Able to use RPE daily in rehab to express subjective intensity level;Long Term:  Able to use RPE to guide intensity level when exercising independently       Knowledge and understanding of Target Heart Rate Range (THRR) Yes       Intervention Provide education and explanation of THRR including how the numbers were predicted and where they are located for reference       Expected Outcomes Short Term: Able to state/look up THRR;Short Term: Able to use daily as guideline for intensity in rehab;Long Term: Able to use THRR to govern intensity when exercising independently       Understanding of Exercise Prescription Yes       Intervention Provide education, explanation, and written materials on patient's individual exercise prescription       Expected Outcomes Short Term: Able to explain program exercise prescription;Long Term: Able to explain home  exercise prescription to exercise independently          Exercise Goals Re-Evaluation :  Exercise Goals Re-Evaluation     Row Name 10/21/23 0937             Exercise Goal Re-Evaluation   Exercise Goals Review Increase Physical Activity;Understanding of Exercise Prescription;Increase Strength and Stamina;Knowledge and understanding of Target Heart Rate Range (THRR);Able to understand and use rate of perceived exertion (RPE) scale       Comments Pt first day in the Pritikin ICR program. Pt tolerated exercise well with an average MET level of 2.5. He is off to a good start and is learning his THRR, RPE and  ExRx       Expected Outcomes Will continue to monitor pt and progress workloads as tolerated without sign or symptom          Discharge Exercise Prescription (Final Exercise Prescription Changes):   Nutrition:  Target Goals: Understanding of nutrition guidelines, daily intake of sodium 1500mg , cholesterol 200mg , calories 30% from fat and 7% or less from saturated fats, daily to have 5 or more servings of fruits and vegetables.  Biometrics:  Pre Biometrics - 10/17/23 1510       Pre Biometrics   Waist Circumference 40.5 inches    Hip Circumference 40 inches    Waist to Hip Ratio 1.01 %    Triceps Skinfold 19 mm    % Body Fat 29.6 %    Grip Strength 37 kg    Flexibility --   unable to reach   Single Leg Stand 30 seconds           Nutrition Therapy Plan and Nutrition Goals:  Nutrition Therapy & Goals - 10/21/23 1036       Nutrition Therapy   Diet Heart Healthy Diet    Drug/Food Interactions Statins/Certain Fruits      Personal Nutrition Goals   Nutrition Goal Patient to identify strategies for reducing cardiovascular risk by attending the Pritikin education and nutrition series weekly.    Personal Goal #2 Patient to improve diet quality by using the plate method as a guide for meal planning to include lean protein/plant protein, fruits, vegetables, whole grains, nonfat dairy as part of a well-balanced diet.    Personal Goal #3 Patient to identify strategies for weight loss of 0.5-2.0# per week.    Comments Patient has meidcal history of CAD s/p CABG, HTN, hyperlipidemia, CKD3, OSA, old MI. LDL at goal. He is followed by Nephrology; potassium was elevated but now in acceptable range. Patient is motivated to lose weight; will continue to discuss strategies for weight loss including calorie density, the plate method as a guide for meal planning, label reading, etc.  Patient will benefit from participation in intensive cardiac rehab for nutrition, exercise, and lifestyle  modification.      Intervention Plan   Intervention Prescribe, educate and counsel regarding individualized specific dietary modifications aiming towards targeted core components such as weight, hypertension, lipid management, diabetes, heart failure and other comorbidities.;Nutrition handout(s) given to patient.    Expected Outcomes Short Term Goal: Understand basic principles of dietary content, such as calories, fat, sodium, cholesterol and nutrients.;Long Term Goal: Adherence to prescribed nutrition plan.          Nutrition Assessments:  MEDIFICTS Score Key: >=70 Need to make dietary changes  40-70 Heart Healthy Diet <= 40 Therapeutic Level Cholesterol Diet   Flowsheet Row INTENSIVE CARDIAC REHAB from 10/21/2023 in [facility] for  Heart, Vascular, & Lung Health  Picture Your Plate Total Score on Admission 67   Picture Your Plate Scores: <29 Unhealthy dietary pattern with much room for improvement. 41-50 Dietary pattern unlikely to meet recommendations for good health and room for improvement. 51-60 More healthful dietary pattern, with some room for improvement.  >60 Healthy dietary pattern, although there may be some specific behaviors that could be improved.    Nutrition Goals Re-Evaluation:  Nutrition Goals Re-Evaluation     Row Name 10/21/23 1036             Goals   Current Weight 211 lb 3.2 oz (95.8 kg)       Comment Cr 1.94, GFR 35, LDL 61, HDL 43, Lpa 222       Expected Outcome Patient has meidcal history of CAD s/p CABG, HTN, hyperlipidemia, CKD3, OSA, old MI. LDL at goal. He is followed by Nephrology; potassium was elevated but now in acceptable range. Patient is motivated to lose weight; will continue to discuss strategies for weight loss including calorie density, the plate method as a guide for meal planning, label reading, etc. Patient will benefit from participation in intensive cardiac rehab for nutrition, exercise, and lifestyle  modification.          Nutrition Goals Re-Evaluation:  Nutrition Goals Re-Evaluation     Row Name 10/21/23 1036             Goals   Current Weight 211 lb 3.2 oz (95.8 kg)       Comment Cr 1.94, GFR 35, LDL 61, HDL 43, Lpa 222       Expected Outcome Patient has meidcal history of CAD s/p CABG, HTN, hyperlipidemia, CKD3, OSA, old MI. LDL at goal. He is followed by Nephrology; potassium was elevated but now in acceptable range. Patient is motivated to lose weight; will continue to discuss strategies for weight loss including calorie density, the plate method as a guide for meal planning, label reading, etc. Patient will benefit from participation in intensive cardiac rehab for nutrition, exercise, and lifestyle modification.          Nutrition Goals Discharge (Final Nutrition Goals Re-Evaluation):  Nutrition Goals Re-Evaluation - 10/21/23 1036       Goals   Current Weight 211 lb 3.2 oz (95.8 kg)    Comment Cr 1.94, GFR 35, LDL 61, HDL 43, Lpa 222    Expected Outcome Patient has meidcal history of CAD s/p CABG, HTN, hyperlipidemia, CKD3, OSA, old MI. LDL at goal. He is followed by Nephrology; potassium was elevated but now in acceptable range. Patient is motivated to lose weight; will continue to discuss strategies for weight loss including calorie density, the plate method as a guide for meal planning, label reading, etc. Patient will benefit from participation in intensive cardiac rehab for nutrition, exercise, and lifestyle modification.          Psychosocial: Target Goals: Acknowledge presence or absence of significant depression and/or stress, maximize coping skills, provide positive support system. Participant is able to verbalize types and ability to use techniques and skills needed for reducing stress and depression.  Initial Review & Psychosocial Screening:  Initial Psych Review & Screening - 10/17/23 1331       Initial Review   Current issues with Current Stress  Concerns    Source of Stress Concerns Family    Comments One of Mr Brook's  brother's recently passed away dealing with his estate      [employer]  Good Support System? Yes   Mr Uzzle has his wife and children for support and extended family     Barriers   Psychosocial barriers to participate in program There are no identifiable barriers or psychosocial needs.      Screening Interventions   Interventions Encouraged to exercise          Quality of Life Scores:  Quality of Life - 10/17/23 1525       Quality of Life   Select Quality of Life      Quality of Life Scores   Health/Function Pre 27.83 %    Socioeconomic Pre 28.07 %    Psych/Spiritual Pre 29.14 %    Family Pre 30 %    GLOBAL Pre 28.47 %         Scores of 19 and below usually indicate a poorer quality of life in these areas.  A difference of  2-3 points is a clinically meaningful difference.  A difference of 2-3 points in the total score of the Quality of Life Index has been associated with significant improvement in overall quality of life, self-image, physical symptoms, and general health in studies assessing change in quality of life.  PHQ-9: Review Flowsheet  More data exists      10/17/2023 04/11/2023 04/05/2022 03/23/2021 06/14/2020  Depression screen PHQ 2/9  Decreased Interest 0 0 0 0 0  Down, Depressed, Hopeless 0 0 0 0 0  PHQ - 2 Score 0 0 0 0 0  Altered sleeping 0 - - - -  Tired, decreased energy 1 - - - -  Change in appetite 0 - - - -  Feeling bad or failure about yourself  0 - - - -  Trouble concentrating 0 - - - -  Moving slowly or fidgety/restless 0 - - - -  Suicidal thoughts 0 - - - -  PHQ-9 Score 1 - - - -  Difficult doing work/chores Not difficult at all - - - -   Interpretation of Total Score  Total Score Depression Severity:  1-4 = Minimal depression, 5-9 = Mild depression, 10-14 = Moderate depression, 15-19 = Moderately severe depression, 20-27 = Severe depression   Psychosocial  Evaluation and Intervention:   Psychosocial Re-Evaluation:  Psychosocial Re-Evaluation     Row Name 10/21/23 1654             Psychosocial Re-Evaluation   Current issues with Current Stress Concerns       Comments Emran Molzahn started cardiac rehab on 10/21/23. [place] Brogden did not voice any increased concerns or stressor on his first day of exercise.       Expected Outcomes Author Hatlestad will  have controlled or decreased stressors upon completion of cardiac rehab       Interventions Stress management education;Encouraged to attend Cardiac Rehabilitation for the exercise;Relaxation education       Continue Psychosocial Services  No Follow up required         Initial Review   Source of Stress Concerns Family       Comments Will continue to montior and offer supoprt as needed.          Psychosocial Discharge (Final Psychosocial Re-Evaluation):  Psychosocial Re-Evaluation - 10/21/23 1654       Psychosocial Re-Evaluation   Current issues with Current Stress Concerns    Comments Eliyas Suddreth started cardiac rehab on 10/21/23. Garrette Caine did not voice any increased concerns or stressor on his first day of exercise.  Expected Outcomes Eitan Doubleday will  have controlled or decreased stressors upon completion of cardiac rehab    Interventions Stress management education;Encouraged to attend Cardiac Rehabilitation for the exercise;Relaxation education    Continue Psychosocial Services  No Follow up required      Initial Review   Source of Stress Concerns Family    Comments Will continue to montior and offer supoprt as needed.          Vocational Rehabilitation: Provide vocational rehab assistance to qualifying candidates.   Vocational Rehab Evaluation & Intervention:  Vocational Rehab - 10/17/23 1336       Initial Vocational Rehab Evaluation & Intervention   Assessment shows need for Vocational Rehabilitation No   Mr Erekson is semi retired and does not need  vocational rehab at this time         Education: Education Goals: Education classes will be provided on a weekly basis, covering required topics. Participant will state understanding/return demonstration of topics presented.    Education     Row Name 10/21/23 0800     Education   Cardiac Education Topics Pritikin   Select Workshops     Workshops   Educator Exercise Physiologist   Select Psychosocial   Psychosocial Workshop Focused Goals, Sustainable Changes   Instruction Review Code 1- Verbalizes Understanding   Class Start Time 0815   Class Stop Time 0850   Class Time Calculation (min) 35 min      Core Videos: Exercise    Move It!  Clinical staff conducted group or individual video education with verbal and written material and guidebook.  Patient learns the recommended Pritikin exercise program. Exercise with the goal of living a long, healthy life. Some of the health benefits of exercise include controlled diabetes, healthier blood pressure levels, improved cholesterol levels, improved heart and lung capacity, improved sleep, and better body composition. Everyone should speak with their doctor before starting or changing an exercise routine.  Biomechanical Limitations Clinical staff conducted group or individual video education with verbal and written material and guidebook.  Patient learns how biomechanical limitations can impact exercise and how we can mitigate and possibly overcome limitations to have an impactful and balanced exercise routine.  Body Composition Clinical staff conducted group or individual video education with verbal and written material and guidebook.  Patient learns that body composition (ratio of muscle mass to fat mass) is a key component to assessing overall fitness, rather than body weight alone. Increased fat mass, especially visceral belly fat, can put us  at increased risk for metabolic syndrome, type 2 diabetes, heart disease, and even death. It  is recommended to combine diet and exercise (cardiovascular and resistance training) to improve your body composition. Seek guidance from your physician and exercise physiologist before implementing an exercise routine.  Exercise Action Plan Clinical staff conducted group or individual video education with verbal and written material and guidebook.  Patient learns the recommended strategies to achieve and enjoy long-term exercise adherence, including variety, self-motivation, self-efficacy, and positive decision making. Benefits of exercise include fitness, good health, weight management, more energy, better sleep, less stress, and overall well-being.  Medical   Heart Disease Risk Reduction Clinical staff conducted group or individual video education with verbal and written material and guidebook.  Patient learns our heart is our most vital organ as it circulates oxygen, nutrients, white blood cells, and hormones throughout the entire body, and carries waste away. Data supports a plant-based eating plan like the Pritikin Program for its effectiveness in  slowing progression of and reversing heart disease. The video provides a number of recommendations to address heart disease.   Metabolic Syndrome and Belly Fat  Clinical staff conducted group or individual video education with verbal and written material and guidebook.  Patient learns what metabolic syndrome is, how it leads to heart disease, and how one can reverse it and keep it from coming back. You have metabolic syndrome if you have 3 of the following 5 criteria: abdominal obesity, high blood pressure, high triglycerides, low HDL cholesterol, and high blood sugar.  Hypertension and Heart Disease Clinical staff conducted group or individual video education with verbal and written material and guidebook.  Patient learns that high blood pressure, or hypertension, is very common in the [place] . Hypertension is largely due to excessive salt  intake, but other important risk factors include being overweight, physical inactivity, drinking too much alcohol, smoking, and not eating enough potassium from fruits and vegetables. High blood pressure is a leading risk factor for heart attack, stroke, congestive heart failure, dementia, kidney failure, and premature death. Long-term effects of excessive salt intake include stiffening of the arteries and thickening of heart muscle and organ damage. Recommendations include ways to reduce hypertension and the risk of heart disease.  Diseases of Our Time - Focusing on Diabetes Clinical staff conducted group or individual video education with verbal and written material and guidebook.  Patient learns why the best way to stop diseases of our time is prevention, through food and other lifestyle changes. Medicine (such as prescription pills and surgeries) is often only a Band-Aid on the problem, not a long-term solution. Most common diseases of our time include obesity, type 2 diabetes, hypertension, heart disease, and cancer. The Pritikin Program is recommended and has been proven to help reduce, reverse, and/or prevent the damaging effects of metabolic syndrome.  Nutrition   Overview of the Pritikin Eating Plan  Clinical staff conducted group or individual video education with verbal and written material and guidebook.  Patient learns about the Pritikin Eating Plan for disease risk reduction. The Pritikin Eating Plan emphasizes a wide variety of unrefined, minimally-processed carbohydrates, like fruits, vegetables, whole grains, and legumes. Go, Caution, and Stop food choices are explained. Plant-based and lean animal proteins are emphasized. Rationale provided for low sodium intake for blood pressure control, low added sugars for blood sugar stabilization, and low added fats and oils for coronary artery disease risk reduction and weight management.  Calorie Density  Clinical staff conducted group or  individual video education with verbal and written material and guidebook.  Patient learns about calorie density and how it impacts the Pritikin Eating Plan. Knowing the characteristics of the food you choose will help you decide whether those foods will lead to weight gain or weight loss, and whether you want to consume more or less of them. Weight loss is usually a side effect of the Pritikin Eating Plan because of its focus on low calorie-dense foods.  Label Reading  Clinical staff conducted group or individual video education with verbal and written material and guidebook.  Patient learns about the Pritikin recommended label reading guidelines and corresponding recommendations regarding calorie density, added sugars, sodium content, and whole grains.  Dining Out - Part 1  Clinical staff conducted group or individual video education with verbal and written material and guidebook.  Patient learns that restaurant meals can be sabotaging because they can be so high in calories, fat, sodium, and/or sugar. Patient learns recommended strategies on how to positively  address this and avoid unhealthy pitfalls.  Facts on Fats  Clinical staff conducted group or individual video education with verbal and written material and guidebook.  Patient learns that lifestyle modifications can be just as effective, if not more so, as many medications for lowering your risk of heart disease. A Pritikin lifestyle can help to reduce your risk of inflammation and atherosclerosis (cholesterol build-up, or plaque, in the artery walls). Lifestyle interventions such as dietary choices and physical activity address the cause of atherosclerosis. A review of the types of fats and their impact on blood cholesterol levels, along with dietary recommendations to reduce fat intake is also included.  Nutrition Action Plan  Clinical staff conducted group or individual video education with verbal and written material and guidebook.   Patient learns how to incorporate Pritikin recommendations into their lifestyle. Recommendations include planning and keeping personal health goals in mind as an important part of their success.  Healthy Mind-Set    Healthy Minds, Bodies, Hearts  Clinical staff conducted group or individual video education with verbal and written material and guidebook.  Patient learns how to identify when they are stressed. Video will discuss the impact of that stress, as well as the many benefits of stress management. Patient will also be introduced to stress management techniques. The way we think, act, and feel has an impact on our hearts.  How Our Thoughts Can Heal Our Hearts  Clinical staff conducted group or individual video education with verbal and written material and guidebook.  Patient learns that negative thoughts can cause depression and anxiety. This can result in negative lifestyle behavior and serious health problems. Cognitive behavioral therapy is an effective method to help control our thoughts in order to change and improve our emotional outlook.  Additional Videos:  Exercise    Improving Performance  Clinical staff conducted group or individual video education with verbal and written material and guidebook.  Patient learns to use a non-linear approach by alternating intensity levels and lengths of time spent exercising to help burn more calories and lose more body fat. Cardiovascular exercise helps improve heart health, metabolism, hormonal balance, blood sugar control, and recovery from fatigue. Resistance training improves strength, endurance, balance, coordination, reaction time, metabolism, and muscle mass. Flexibility exercise improves circulation, posture, and balance. Seek guidance from your physician and exercise physiologist before implementing an exercise routine and learn your capabilities and proper form for all exercise.  Introduction to Yoga  Clinical staff conducted group or  individual video education with verbal and written material and guidebook.  Patient learns about yoga, a discipline of the coming together of mind, breath, and body. The benefits of yoga include improved flexibility, improved range of motion, better posture and core strength, increased lung function, weight loss, and positive self-image. Yoga's heart health benefits include lowered blood pressure, healthier heart rate, decreased cholesterol and triglyceride levels, improved immune function, and reduced stress. Seek guidance from your physician and exercise physiologist before implementing an exercise routine and learn your capabilities and proper form for all exercise.  Medical   Aging: Enhancing Your Quality of Life  Clinical staff conducted group or individual video education with verbal and written material and guidebook.  Patient learns key strategies and recommendations to stay in good physical health and enhance quality of life, such as prevention strategies, having an advocate, securing a Health Care Proxy and Power of Attorney, and keeping a list of medications and system for tracking them. It also discusses how to avoid risk for  bone loss.  Biology of Weight Control  Clinical staff conducted group or individual video education with verbal and written material and guidebook.  Patient learns that weight gain occurs because we consume more calories than we burn (eating more, moving less). Even if your body weight is normal, you may have higher ratios of fat compared to muscle mass. Too much body fat puts you at increased risk for cardiovascular disease, heart attack, stroke, type 2 diabetes, and obesity-related cancers. In addition to exercise, following the Pritikin Eating Plan can help reduce your risk.  Decoding Lab Results  Clinical staff conducted group or individual video education with verbal and written material and guidebook.  Patient learns that lab test reflects one measurement whose  values change over time and are influenced by many factors, including medication, stress, sleep, exercise, food, hydration, pre-existing medical conditions, and more. It is recommended to use the knowledge from this video to become more involved with your lab results and evaluate your numbers to speak with your doctor.   Diseases of Our Time - Overview  Clinical staff conducted group or individual video education with verbal and written material and guidebook.  Patient learns that according to the CDC, 50% to 70% of chronic diseases (such as obesity, type 2 diabetes, elevated lipids, hypertension, and heart disease) are avoidable through lifestyle improvements including healthier food choices, listening to satiety cues, and increased physical activity.  Sleep Disorders Clinical staff conducted group or individual video education with verbal and written material and guidebook.  Patient learns how good quality and duration of sleep are important to overall health and well-being. Patient also learns about sleep disorders and how they impact health along with recommendations to address them, including discussing with a physician.  Nutrition  Dining Out - Part 2 Clinical staff conducted group or individual video education with verbal and written material and guidebook.  Patient learns how to plan ahead and communicate in order to maximize their dining experience in a healthy and nutritious manner. Included are recommended food choices based on the type of restaurant the patient is visiting.   Fueling a Banker conducted group or individual video education with verbal and written material and guidebook.  There is a strong connection between our food choices and our health. Diseases like obesity and type 2 diabetes are very prevalent and are in large-part due to lifestyle choices. The Pritikin Eating Plan provides plenty of food and hunger-curbing satisfaction. It is easy to follow,  affordable, and helps reduce health risks.  Menu Workshop  Clinical staff conducted group or individual video education with verbal and written material and guidebook.  Patient learns that restaurant meals can sabotage health goals because they are often packed with calories, fat, sodium, and sugar. Recommendations include strategies to plan ahead and to communicate with the manager, chef, or server to help order a healthier meal.  Planning Your Eating Strategy  Clinical staff conducted group or individual video education with verbal and written material and guidebook.  Patient learns about the Pritikin Eating Plan and its benefit of reducing the risk of disease. The Pritikin Eating Plan does not focus on calories. Instead, it emphasizes high-quality, nutrient-rich foods. By knowing the characteristics of the foods, we choose, we can determine their calorie density and make informed decisions.  Targeting Your Nutrition Priorities  Clinical staff conducted group or individual video education with verbal and written material and guidebook.  Patient learns that lifestyle habits have a tremendous impact on  disease risk and progression. This video provides eating and physical activity recommendations based on your personal health goals, such as reducing LDL cholesterol, losing weight, preventing or controlling type 2 diabetes, and reducing high blood pressure.  Vitamins and Minerals  Clinical staff conducted group or individual video education with verbal and written material and guidebook.  Patient learns different ways to obtain key vitamins and minerals, including through a recommended healthy diet. It is important to discuss all supplements you take with your doctor.   Healthy Mind-Set    Smoking Cessation  Clinical staff conducted group or individual video education with verbal and written material and guidebook.  Patient learns that cigarette smoking and tobacco addiction pose a serious health  risk which affects millions of people. Stopping smoking will significantly reduce the risk of heart disease, lung disease, and many forms of cancer. Recommended strategies for quitting are covered, including working with your doctor to develop a successful plan.  Culinary   Becoming a Set designer conducted group or individual video education with verbal and written material and guidebook.  Patient learns that cooking at home can be healthy, cost-effective, quick, and puts them in control. Keys to cooking healthy recipes will include looking at your recipe, assessing your equipment needs, planning ahead, making it simple, choosing cost-effective seasonal ingredients, and limiting the use of added fats, salts, and sugars.  Cooking - Breakfast and Snacks  Clinical staff conducted group or individual video education with verbal and written material and guidebook.  Patient learns how important breakfast is to satiety and nutrition through the entire day. Recommendations include key foods to eat during breakfast to help stabilize blood sugar levels and to prevent overeating at meals later in the day. Planning ahead is also a key component.  Cooking - Educational psychologist conducted group or individual video education with verbal and written material and guidebook.  Patient learns eating strategies to improve overall health, including an approach to cook more at home. Recommendations include thinking of animal protein as a side on your plate rather than center stage and focusing instead on lower calorie dense options like vegetables, fruits, whole grains, and plant-based proteins, such as beans. Making sauces in large quantities to freeze for later and leaving the skin on your vegetables are also recommended to maximize your experience.  Cooking - Healthy Salads and Dressing Clinical staff conducted group or individual video education with verbal and written material and  guidebook.  Patient learns that vegetables, fruits, whole grains, and legumes are the foundations of the Pritikin Eating Plan. Recommendations include how to incorporate each of these in flavorful and healthy salads, and how to create homemade salad dressings. Proper handling of ingredients is also covered. Cooking - Soups and [employer] - Soups and Desserts Clinical staff conducted group or individual video education with verbal and written material and guidebook.  Patient learns that Pritikin soups and desserts make for easy, nutritious, and delicious snacks and meal components that are low in sodium, fat, sugar, and calorie density, while high in vitamins, minerals, and filling fiber. Recommendations include simple and healthy ideas for soups and desserts.   Overview     The Pritikin Solution Program Overview Clinical staff conducted group or individual video education with verbal and written material and guidebook.  Patient learns that the results of the Pritikin Program have been documented in more than 100 articles published in peer-reviewed journals, and the benefits include reducing risk factors for (  and, in some cases, even reversing) high cholesterol, high blood pressure, type 2 diabetes, obesity, and more! An overview of the three key pillars of the Pritikin Program will be covered: eating well, doing regular exercise, and having a healthy mind-set.  WORKSHOPS  Exercise: Exercise Basics: Building Your Action Plan Clinical staff led group instruction and group discussion with PowerPoint presentation and patient guidebook. To enhance the learning environment the use of posters, models and videos may be added. At the conclusion of this workshop, patients will comprehend the difference between physical activity and exercise, as well as the benefits of incorporating both, into their routine. Patients will understand the FITT (Frequency, Intensity, Time, and Type) principle and how to  use it to build an exercise action plan. In addition, safety concerns and other considerations for exercise and cardiac rehab will be addressed by the presenter. The purpose of this lesson is to promote a comprehensive and effective weekly exercise routine in order to improve patients' overall level of fitness.   Managing Heart Disease: Your Path to a Healthier Heart Clinical staff led group instruction and group discussion with PowerPoint presentation and patient guidebook. To enhance the learning environment the use of posters, models and videos may be added.At the conclusion of this workshop, patients will understand the anatomy and physiology of the heart. Additionally, they will understand how Pritikin's three pillars impact the risk factors, the progression, and the management of heart disease.  The purpose of this lesson is to provide a high-level overview of the heart, heart disease, and how the Pritikin lifestyle positively impacts risk factors.  Exercise Biomechanics Clinical staff led group instruction and group discussion with PowerPoint presentation and patient guidebook. To enhance the learning environment the use of posters, models and videos may be added. Patients will learn how the structural parts of their bodies function and how these functions impact their daily activities, movement, and exercise. Patients will learn how to promote a neutral spine, learn how to manage pain, and identify ways to improve their physical movement in order to promote healthy living. The purpose of this lesson is to expose patients to common physical limitations that impact physical activity. Participants will learn practical ways to adapt and manage aches and pains, and to minimize their effect on regular exercise. Patients will learn how to maintain good posture while sitting, walking, and lifting.  Balance Training and Fall Prevention  Clinical staff led group instruction and group discussion  with PowerPoint presentation and patient guidebook. To enhance the learning environment the use of posters, models and videos may be added. At the conclusion of this workshop, patients will understand the importance of their sensorimotor skills (vision, proprioception, and the vestibular system) in maintaining their ability to balance as they age. Patients will apply a variety of balancing exercises that are appropriate for their current level of function. Patients will understand the common causes for poor balance, possible solutions to these problems, and ways to modify their physical environment in order to minimize their fall risk. The purpose of this lesson is to teach patients about the importance of maintaining balance as they age and ways to minimize their risk of falling.  WORKSHOPS   Nutrition:  Fueling a Ship broker led group instruction and group discussion with PowerPoint presentation and patient guidebook. To enhance the learning environment the use of posters, models and videos may be added. Patients will review the foundational principles of the Pritikin Eating Plan and understand what constitutes a serving size  in each of the food groups. Patients will also learn Pritikin-friendly foods that are better choices when away from home and review make-ahead meal and snack options. Calorie density will be reviewed and applied to three nutrition priorities: weight maintenance, weight loss, and weight gain. The purpose of this lesson is to reinforce (in a group setting) the key concepts around what patients are recommended to eat and how to apply these guidelines when away from home by planning and selecting Pritikin-friendly options. Patients will understand how calorie density may be adjusted for different weight management goals.  Mindful Eating  Clinical staff led group instruction and group discussion with PowerPoint presentation and patient guidebook. To enhance the  learning environment the use of posters, models and videos may be added. Patients will briefly review the concepts of the Pritikin Eating Plan and the importance of low-calorie dense foods. The concept of mindful eating will be introduced as well as the importance of paying attention to internal hunger signals. Triggers for non-hunger eating and techniques for dealing with triggers will be explored. The purpose of this lesson is to provide patients with the opportunity to review the basic principles of the Pritikin Eating Plan, discuss the value of eating mindfully and how to measure internal cues of hunger and fullness using the Hunger Scale. Patients will also discuss reasons for non-hunger eating and learn strategies to use for controlling emotional eating.  Targeting Your Nutrition Priorities Clinical staff led group instruction and group discussion with PowerPoint presentation and patient guidebook. To enhance the learning environment the use of posters, models and videos may be added. Patients will learn how to determine their genetic susceptibility to disease by reviewing their family history. Patients will gain insight into the importance of diet as part of an overall healthy lifestyle in mitigating the impact of genetics and other environmental insults. The purpose of this lesson is to provide patients with the opportunity to assess their personal nutrition priorities by looking at their family history, their own health history and current risk factors. Patients will also be able to discuss ways of prioritizing and modifying the Pritikin Eating Plan for their highest risk areas  Menu  Clinical staff led group instruction and group discussion with PowerPoint presentation and patient guidebook. To enhance the learning environment the use of posters, models and videos may be added. Using menus brought in from [employer], or printed from [employer], patients will apply the Pritikin dining out  guidelines that were presented in the [employer] video. Patients will also be able to practice these guidelines in a variety of provided scenarios. The purpose of this lesson is to provide patients with the opportunity to practice hands-on learning of the Pritikin Dining Out guidelines with actual menus and practice scenarios.  Label Reading Clinical staff led group instruction and group discussion with PowerPoint presentation and patient guidebook. To enhance the learning environment the use of posters, models and videos may be added. Patients will review and discuss the Pritikin label reading guidelines presented in Pritikin's Label Reading Educational series video. Using fool labels brought in from local grocery stores and markets, patients will apply the label reading guidelines and determine if the packaged food meet the Pritikin guidelines. The purpose of this lesson is to provide patients with the opportunity to review, discuss, and practice hands-on learning of the Pritikin Label Reading guidelines with actual packaged food labels. Cooking School  Pritikin's [employer] are designed to teach patients ways to prepare quick,  simple, and affordable recipes at home. The importance of nutrition's role in chronic disease risk reduction is reflected in its emphasis in the overall Pritikin program. By learning how to prepare essential core Pritikin Eating Plan recipes, patients will increase control over what they eat; be able to customize the flavor of foods without the use of added salt, sugar, or fat; and improve the quality of the food they consume. By learning a set of core recipes which are easily assembled, quickly prepared, and affordable, patients are more likely to prepare more healthy foods at home. These workshops focus on convenient breakfasts, simple entres, side dishes, and desserts which can be prepared with minimal effort and are consistent with nutrition  recommendations for cardiovascular risk reduction. Cooking [employer] are taught by a Armed forces logistics/support/administrative officer (RD) who has been trained by the [employer]. The chef or RD has a clear understanding of the importance of minimizing - if not completely eliminating - added fat, sugar, and sodium in recipes. Throughout the series of Cooking School Workshop sessions, patients will learn about healthy ingredients and efficient methods of cooking to build confidence in their capability to prepare    Cooking School weekly topics:  Adding Flavor- Sodium-Free  Fast and Healthy Breakfasts  Powerhouse Plant-Based Proteins  Satisfying Salads and Dressings  Simple Sides and Sauces  International Cuisine-Spotlight on the [employer] Zones  Delicious Desserts  Savory Soups  [employer] - Meals in a Astronomer Appetizers and Snacks  Comforting Weekend Breakfasts  One-Pot Wonders   Fast Evening Meals  Landscape architect Your Pritikin Plate  WORKSHOPS   Healthy Mindset (Psychosocial):  Focused Goals, Sustainable Changes Clinical staff led group instruction and group discussion with PowerPoint presentation and patient guidebook. To enhance the learning environment the use of posters, models and videos may be added. Patients will be able to apply effective goal setting strategies to establish at least one personal goal, and then take consistent, meaningful action toward that goal. They will learn to identify common barriers to achieving personal goals and develop strategies to overcome them. Patients will also gain an understanding of how our mind-set can impact our ability to achieve goals and the importance of cultivating a positive and growth-oriented mind-set. The purpose of this lesson is to provide patients with a deeper understanding of how to set and achieve personal goals, as well as the tools and strategies needed to overcome common obstacles which may arise along  the way.  From Head to Heart: The Power of a Healthy Outlook  Clinical staff led group instruction and group discussion with PowerPoint presentation and patient guidebook. To enhance the learning environment the use of posters, models and videos may be added. Patients will be able to recognize and describe the impact of emotions and mood on physical health. They will discover the importance of self-care and explore self-care practices which may work for them. Patients will also learn how to utilize the 4 C's to cultivate a healthier outlook and better manage stress and challenges. The purpose of this lesson is to demonstrate to patients how a healthy outlook is an essential part of maintaining good health, especially as they continue their cardiac rehab journey.  Healthy Sleep for a Healthy Heart Clinical staff led group instruction and group discussion with PowerPoint presentation and patient guidebook. To enhance the learning environment the use of posters, models and videos may be added. At the conclusion of this workshop, patients will be able  to demonstrate knowledge of the importance of sleep to overall health, well-being, and quality of life. They will understand the symptoms of, and treatments for, common sleep disorders. Patients will also be able to identify daytime and nighttime behaviors which impact sleep, and they will be able to apply these tools to help manage sleep-related challenges. The purpose of this lesson is to provide patients with a general overview of sleep and outline the importance of quality sleep. Patients will learn about a few of the most common sleep disorders. Patients will also be introduced to the concept of "sleep hygiene," and discover ways to self-manage certain sleeping problems through simple daily behavior changes. Finally, the workshop will motivate patients by clarifying the links between quality sleep and their goals of heart-healthy living.   Recognizing and  Reducing Stress Clinical staff led group instruction and group discussion with PowerPoint presentation and patient guidebook. To enhance the learning environment the use of posters, models and videos may be added. At the conclusion of this workshop, patients will be able to understand the types of stress reactions, differentiate between acute and chronic stress, and recognize the impact that chronic stress has on their health. They will also be able to apply different coping mechanisms, such as reframing negative self-talk. Patients will have the opportunity to practice a variety of stress management techniques, such as deep abdominal breathing, progressive muscle relaxation, and/or guided imagery.  The purpose of this lesson is to educate patients on the role of stress in their lives and to provide healthy techniques for coping with it.  Learning Barriers/Preferences:  Learning Barriers/Preferences - 10/17/23 1335       Learning Barriers/Preferences   Learning Barriers Sight   wears glasses   Learning Preferences Pictoral;Video          Education Topics:  Knowledge Questionnaire Score:  Knowledge Questionnaire Score - 10/17/23 1526       Knowledge Questionnaire Score   Pre Score 23/24          Core Components/Risk Factors/Patient Goals at Admission:  Personal Goals and Risk Factors at Admission - 10/17/23 1528       Core Components/Risk Factors/Patient Goals on Admission    Weight Management Yes;Weight Loss    Intervention Weight Management: Develop a combined nutrition and exercise program designed to reach desired caloric intake, while maintaining appropriate intake of nutrient and fiber, sodium and fats, and appropriate energy expenditure required for the weight goal.;Weight Management: Provide education and appropriate resources to help participant work on and attain dietary goals.    Admit Weight 210 lb 12.2 oz (95.6 kg)    Expected Outcomes Short Term: Continue to assess  and modify interventions until short term weight is achieved;Long Term: Adherence to nutrition and physical activity/exercise program aimed toward attainment of established weight goal;Weight Loss: Understanding of general recommendations for a balanced deficit meal plan, which promotes 1-2 lb weight loss per week and includes a negative energy balance of [phone] kcal/d;Understanding recommendations for meals to include 15-35% energy as protein, 25-35% energy from fat, 35-60% energy from carbohydrates, less than 200mg  of dietary cholesterol, 20-35 gm of total fiber daily;Understanding of distribution of calorie intake throughout the day with the consumption of 4-5 meals/snacks    Hypertension Yes    Intervention Provide education on lifestyle modifcations including regular physical activity/exercise, weight management, moderate sodium restriction and increased consumption of fresh fruit, vegetables, and low fat dairy, alcohol moderation, and smoking cessation.;Monitor prescription use compliance.    Expected Outcomes Short  Term: Continued assessment and intervention until BP is < 140/53mm HG in hypertensive participants. < 130/69mm HG in hypertensive participants with diabetes, heart failure or chronic kidney disease.;Long Term: Maintenance of blood pressure at goal levels.    Lipids Yes    Intervention Provide education and support for participant on nutrition & aerobic/resistive exercise along with prescribed medications to achieve LDL 70mg , HDL >40mg .    Expected Outcomes Short Term: Participant states understanding of desired cholesterol values and is compliant with medications prescribed. Participant is following exercise prescription and nutrition guidelines.;Long Term: Cholesterol controlled with medications as prescribed, with individualized exercise RX and with personalized nutrition plan. Value goals: LDL < 70mg , HDL > 40 mg.    Stress Yes    Intervention Offer individual and/or small group  education and counseling on adjustment to heart disease, stress management and health-related lifestyle change. Teach and support self-help strategies.;Refer participants experiencing significant psychosocial distress to appropriate mental health specialists for further evaluation and treatment. When possible, include family members and significant others in education/counseling sessions.    Expected Outcomes Short Term: Participant demonstrates changes in health-related behavior, relaxation and other stress management skills, ability to obtain effective social support, and compliance with psychotropic medications if prescribed.;Long Term: Emotional wellbeing is indicated by absence of clinically significant psychosocial distress or social isolation.    Personal Goal Other Yes    Personal Goal Have the mind relax more, wants waist size at 36 inches, endurance    Intervention Will continue to monitor pt and progress workloads as tolerated without sign or symptom    Expected Outcomes Pt will achieve his goals          Core Components/Risk Factors/Patient Goals Review:   Goals and Risk Factor Review     Row Name 10/21/23 1657             Core Components/Risk Factors/Patient Goals Review   Personal Goals Review Weight Management/Obesity;Hypertension;Lipids;Stress       Review Daquavion Catala started cardiac rehab on 10/21/23. [place] did well with exercise. Vital signs were stable       Expected Outcomes Rhone Ozaki will continue to particpate in cardiac rehab for exercise, nutrition and lifestyle modifications          Core Components/Risk Factors/Patient Goals at Discharge (Final Review):   Goals and Risk Factor Review - 10/21/23 1657       Core Components/Risk Factors/Patient Goals Review   Personal Goals Review Weight Management/Obesity;Hypertension;Lipids;Stress    Review Hakim [place]iefield started cardiac rehab on 10/21/23. [place] did well with exercise. Vital signs were  stable    Expected Outcomes Lydon Vansickle will continue to particpate in cardiac rehab for exercise, nutrition and lifestyle modifications          ITP Comments:  ITP Comments     Row Name 10/17/23 0945 10/21/23 1652         ITP Comments Gaylyn Keas, MD: Medical Director.  Intorduction to the [facility]/ Intensive Caridac Rehab.  Initial orientation packet reviewed with the patient. 30 Day ITP Review. [facility] started cardiac rehab on 10/21/23         Comments: see ITP comments.Monte Antonio RN BSN

## 2023-10-22 ENCOUNTER — Ambulatory Visit: Admitting: Physician Assistant

## 2023-10-23 ENCOUNTER — Encounter (HOSPITAL_COMMUNITY)
Admission: RE | Admit: 2023-10-23 | Discharge: 2023-10-23 | Disposition: A | Discharge status: Home / Self Care | Source: Ambulatory Visit | Attending: Cardiovascular Disease | Admitting: Cardiovascular Disease

## 2023-10-23 ENCOUNTER — Ambulatory Visit: Admitting: Cardiovascular Disease

## 2023-10-23 DIAGNOSIS — Z955 Presence of coronary angioplasty implant and graft: Secondary | ICD-10-CM | POA: Diagnosis not present

## 2023-10-23 DIAGNOSIS — Z45812 Encounter for adjustment or removal of left breast implant: Secondary | ICD-10-CM | POA: Diagnosis not present

## 2023-10-25 ENCOUNTER — Encounter (HOSPITAL_COMMUNITY)
Admission: RE | Admit: 2023-10-25 | Discharge: 2023-10-25 | Disposition: A | Discharge status: Home / Self Care | Source: Ambulatory Visit | Attending: Cardiovascular Disease | Admitting: Cardiovascular Disease

## 2023-10-25 DIAGNOSIS — Z45812 Encounter for adjustment or removal of left breast implant: Secondary | ICD-10-CM | POA: Diagnosis not present

## 2023-10-25 DIAGNOSIS — Z955 Presence of coronary angioplasty implant and graft: Secondary | ICD-10-CM | POA: Diagnosis not present

## 2023-10-28 ENCOUNTER — Encounter (HOSPITAL_COMMUNITY)
Admission: RE | Admit: 2023-10-28 | Discharge: 2023-10-28 | Disposition: A | Discharge status: Home / Self Care | Source: Ambulatory Visit | Attending: Cardiovascular Disease | Admitting: Cardiovascular Disease

## 2023-10-28 DIAGNOSIS — Z955 Presence of coronary angioplasty implant and graft: Secondary | ICD-10-CM

## 2023-10-28 DIAGNOSIS — Z45812 Encounter for adjustment or removal of left breast implant: Secondary | ICD-10-CM | POA: Diagnosis not present

## 2023-10-30 ENCOUNTER — Encounter (HOSPITAL_COMMUNITY)
Admission: RE | Admit: 2023-10-30 | Discharge: 2023-10-30 | Disposition: A | Discharge status: Home / Self Care | Source: Ambulatory Visit | Attending: Cardiovascular Disease | Admitting: Cardiovascular Disease

## 2023-10-30 DIAGNOSIS — Z45812 Encounter for adjustment or removal of left breast implant: Secondary | ICD-10-CM | POA: Diagnosis not present

## 2023-10-30 DIAGNOSIS — Z955 Presence of coronary angioplasty implant and graft: Secondary | ICD-10-CM

## 2023-11-01 ENCOUNTER — Encounter (HOSPITAL_COMMUNITY)
Admission: RE | Admit: 2023-11-01 | Discharge: 2023-11-01 | Disposition: A | Discharge status: Home / Self Care | Source: Ambulatory Visit | Attending: Cardiovascular Disease | Admitting: Cardiovascular Disease

## 2023-11-01 DIAGNOSIS — Z955 Presence of coronary angioplasty implant and graft: Secondary | ICD-10-CM | POA: Diagnosis not present

## 2023-11-01 DIAGNOSIS — Z45812 Encounter for adjustment or removal of left breast implant: Secondary | ICD-10-CM | POA: Diagnosis not present

## 2023-11-04 ENCOUNTER — Encounter (HOSPITAL_COMMUNITY)
Admission: RE | Admit: 2023-11-04 | Discharge: 2023-11-04 | Disposition: A | Discharge status: Home / Self Care | Source: Ambulatory Visit | Attending: Cardiovascular Disease | Admitting: Cardiovascular Disease

## 2023-11-04 DIAGNOSIS — Z955 Presence of coronary angioplasty implant and graft: Secondary | ICD-10-CM

## 2023-11-04 DIAGNOSIS — Z45812 Encounter for adjustment or removal of left breast implant: Secondary | ICD-10-CM | POA: Diagnosis not present

## 2023-11-06 ENCOUNTER — Other Ambulatory Visit: Payer: Self-pay

## 2023-11-06 ENCOUNTER — Encounter (HOSPITAL_COMMUNITY)
Admission: RE | Admit: 2023-11-06 | Discharge: 2023-11-06 | Disposition: A | Discharge status: Home / Self Care | Source: Ambulatory Visit | Attending: Cardiovascular Disease | Admitting: Cardiovascular Disease

## 2023-11-06 DIAGNOSIS — Z48812 Encounter for surgical aftercare following surgery on the circulatory system: Secondary | ICD-10-CM | POA: Diagnosis not present

## 2023-11-06 DIAGNOSIS — Z955 Presence of coronary angioplasty implant and graft: Secondary | ICD-10-CM | POA: Insufficient documentation

## 2023-11-06 MED ORDER — VALSARTAN 80 MG PO TABS: 80.0000 mg | ORAL_TABLET | Freq: Every day | ORAL | 3 refills | Status: DC

## 2023-11-11 ENCOUNTER — Encounter (HOSPITAL_COMMUNITY)
Admission: RE | Admit: 2023-11-11 | Discharge: 2023-11-11 | Disposition: A | Discharge status: Home / Self Care | Source: Ambulatory Visit | Attending: Cardiovascular Disease

## 2023-11-11 DIAGNOSIS — Z955 Presence of coronary angioplasty implant and graft: Secondary | ICD-10-CM | POA: Diagnosis not present

## 2023-11-11 DIAGNOSIS — Z48812 Encounter for surgical aftercare following surgery on the circulatory system: Secondary | ICD-10-CM | POA: Diagnosis not present

## 2023-11-13 ENCOUNTER — Encounter (HOSPITAL_COMMUNITY)
Admission: RE | Admit: 2023-11-13 | Discharge: 2023-11-13 | Disposition: A | Discharge status: Home / Self Care | Source: Ambulatory Visit | Attending: Cardiovascular Disease

## 2023-11-13 DIAGNOSIS — Z955 Presence of coronary angioplasty implant and graft: Secondary | ICD-10-CM

## 2023-11-13 DIAGNOSIS — Z48812 Encounter for surgical aftercare following surgery on the circulatory system: Secondary | ICD-10-CM | POA: Diagnosis not present

## 2023-11-14 ENCOUNTER — Other Ambulatory Visit: Payer: Self-pay

## 2023-11-14 DIAGNOSIS — I25810 Atherosclerosis of coronary artery bypass graft(s) without angina pectoris: Secondary | ICD-10-CM

## 2023-11-14 DIAGNOSIS — I10 Essential (primary) hypertension: Secondary | ICD-10-CM

## 2023-11-14 MED ORDER — RANOLAZINE ER 1000 MG PO TB12: 1000.0000 mg | ORAL_TABLET | Freq: Two times a day (BID) | ORAL | 3 refills | Status: AC

## 2023-11-14 MED ORDER — EZETIMIBE 10 MG PO TABS: 10.0000 mg | ORAL_TABLET | Freq: Every day | ORAL | 3 refills | Status: AC

## 2023-11-14 MED ORDER — AMLODIPINE BESYLATE 5 MG PO TABS: 7.5000 mg | ORAL_TABLET | Freq: Every day | ORAL | 3 refills | Status: DC

## 2023-11-14 MED ORDER — NITROGLYCERIN 0.4 MG SL SUBL: 0.4000 mg | SUBLINGUAL_TABLET | SUBLINGUAL | 11 refills | Status: AC | PRN

## 2023-11-14 MED ORDER — ROSUVASTATIN CALCIUM 40 MG PO TABS: 40.0000 mg | ORAL_TABLET | Freq: Every day | ORAL | 3 refills | Status: AC

## 2023-11-14 MED ORDER — VALSARTAN 80 MG PO TABS: 80.0000 mg | ORAL_TABLET | Freq: Every day | ORAL | 3 refills | Status: AC

## 2023-11-14 MED ORDER — METOPROLOL TARTRATE 25 MG PO TABS: ORAL_TABLET | ORAL | 3 refills | Status: DC

## 2023-11-14 MED ORDER — ISOSORBIDE MONONITRATE ER 120 MG PO TB24: 120.0000 mg | ORAL_TABLET | Freq: Every day | ORAL | 3 refills | Status: AC

## 2023-11-14 MED ORDER — CLOPIDOGREL BISULFATE 75 MG PO TABS: 75.0000 mg | ORAL_TABLET | Freq: Every day | ORAL | 3 refills | Status: AC

## 2023-11-15 ENCOUNTER — Encounter (HOSPITAL_COMMUNITY)
Admission: RE | Admit: 2023-11-15 | Discharge: 2023-11-15 | Disposition: A | Discharge status: Home / Self Care | Source: Ambulatory Visit | Attending: Cardiovascular Disease

## 2023-11-15 DIAGNOSIS — Z955 Presence of coronary angioplasty implant and graft: Secondary | ICD-10-CM | POA: Diagnosis not present

## 2023-11-15 DIAGNOSIS — Z48812 Encounter for surgical aftercare following surgery on the circulatory system: Secondary | ICD-10-CM | POA: Diagnosis not present

## 2023-11-15 NOTE — Progress Notes (Signed)
 CARDIAC REHAB PHASE 2  Reviewed home exercise with pt today. Pt is tolerating exercise well. Pt will continue to exercise on his own by walking, treadmill and stationary bike for 30-45 minutes per session 1-2 days a week in addition to the 3 days in CRP2. Advised pt on THRR, RPE scale, hydration and temperature/humidity precautions. Reinforced NTG use, S/S to stop exercise and when to call MD vs 911. Encouraged warm up cool down and stretches with exercise sessions. Pt verbalized understanding, all questions were answered and pt was given a copy to take home.    Alec GORMAN Finder ACSM-CEP 11/15/2023 8:32 AM

## 2023-11-18 ENCOUNTER — Encounter (HOSPITAL_COMMUNITY)
Admission: RE | Admit: 2023-11-18 | Discharge: 2023-11-18 | Disposition: A | Discharge status: Home / Self Care | Source: Ambulatory Visit | Attending: Cardiovascular Disease | Admitting: Cardiovascular Disease

## 2023-11-18 DIAGNOSIS — Z48812 Encounter for surgical aftercare following surgery on the circulatory system: Secondary | ICD-10-CM | POA: Diagnosis not present

## 2023-11-18 DIAGNOSIS — Z955 Presence of coronary angioplasty implant and graft: Secondary | ICD-10-CM | POA: Diagnosis not present

## 2023-11-19 NOTE — Progress Notes (Signed)
 Cardiac Individual Treatment Plan  Patient Details  Name: Joshua Hahn MRN: [id number] Date of Birth: Sep 17, 1945 Referring Provider:   Flowsheet Row INTENSIVE CARDIAC REHAB ORIENT from 10/17/2023 in [facility]  Referring Provider Dr. Ozell Fell MD    Initial Encounter Date:  Flowsheet Row INTENSIVE CARDIAC REHAB ORIENT from 10/17/2023 in [facility]  Date 10/17/23    Visit Diagnosis: 09/20/23 S/P DES CFX  Patient's Home Medications on Admission:  Current Outpatient Medications:    amLODipine  (NORVASC ) 5 MG tablet, Take 1.5 tablets (7.5 mg total) by mouth daily., Disp: 90 tablet, Rfl: 3   Ascorbic Acid  (VITAMIN C  PO), Take 1 tablet by mouth daily., Disp: , Rfl:    aspirin  81 MG tablet, Take 81 mg by mouth daily., Disp: , Rfl:    cholecalciferol  (VITAMIN D ) 1000 UNITS tablet, Take 1,000 Units by mouth daily., Disp: , Rfl:    clopidogrel  (PLAVIX ) 75 MG tablet, Take 1 tablet (75 mg total) by mouth daily., Disp: 90 tablet, Rfl: 3   Coenzyme Q10 (CO Q 10) 100 MG CAPS, Take 300 mg by mouth every evening., Disp: , Rfl:    dorzolamide (TRUSOPT) 2 % ophthalmic solution, Place 1 drop into both eyes daily., Disp: , Rfl:    ezetimibe  (ZETIA ) 10 MG tablet, Take 1 tablet (10 mg total) by mouth daily., Disp: 90 tablet, Rfl: 3   isosorbide  mononitrate (IMDUR ) 120 MG 24 hr tablet, Take 1 tablet (120 mg total) by mouth daily., Disp: 90 tablet, Rfl: 3   metoprolol  tartrate (LOPRESSOR ) 25 MG tablet, TAKE ONE TABLET IN THE MORNING AND HALF TABLET IN THE EVENING., Disp: 135 tablet, Rfl: 3   nitroGLYCERIN  (NITROSTAT ) 0.4 MG SL tablet, Place 1 tablet (0.4 mg total) under the tongue every 5 (five) minutes as needed for chest pain., Disp: 25 tablet, Rfl: 11   omega-3 acid ethyl esters (LOVAZA ) 1 g capsule, TAKE 1 CAPSULE BY MOUTH 2 TIMES DAILY., Disp: 180 capsule, Rfl: 2   ranolazine   (RANEXA ) 1000 MG SR tablet, Take 1 tablet (1,000 mg total) by mouth 2 (two) times daily., Disp: 180 tablet, Rfl: 3   rosuvastatin  (CRESTOR ) 40 MG tablet, Take 1 tablet (40 mg total) by mouth daily., Disp: 90 tablet, Rfl: 3   sodium zirconium cyclosilicate  (LOKELMA ) 10 g PACK packet, Take 10 g (1 packet total) by mouth as needed per instruction by provider based on blood work.. (Patient not taking: Reported on 10/17/2023), Disp: 7 packet, Rfl: 0   valsartan  (DIOVAN ) 80 MG tablet, Take 1 tablet (80 mg total) by mouth daily., Disp: 90 tablet, Rfl: 3   vitamin B-12 (CYANOCOBALAMIN ) 100 MCG tablet, Take 100 mcg by mouth daily. 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 levels LDL IIIa+b% and HDL2b%   Hypertension    Myocardial infarct [facility])    Myocardial infarction (HCC) 1994   inferior wall MI at [facility]   Prostate cancer [facility])    Sleep apnea  uses CPAP PRN   Sleep apnea, obstructive    using C-PAP    Tobacco Use: Social History   Tobacco Use  Smoking Status Former   Current packs/day: 0.00   Types: Cigarettes   Quit date: 05/07/1973   Years since quitting: 50.5   Passive exposure: Never  Smokeless Tobacco Never    Labs: Review Flowsheet  More data exists      Latest Ref Rng & Units 04/28/2020 11/29/2020 08/16/2022 04/02/2023 09/10/2023  Labs for ITP Cardiac and Pulmonary Rehab  Cholestrol 100 - 199 mg/dL 846  886  882  877  873   LDL (calc) 0 - 99 mg/dL 87  54  56  63  61   HDL-C >39 mg/dL 48  39  39  40  43   Trlycerides 0 - 149 mg/dL 95  892  876  895  878   Hemoglobin A1c 4.8  - 5.6 % 7.0  - - - -    Capillary Blood Glucose: No results found for: GLUCAP   Exercise Target Goals: Exercise Program Goal: Individual exercise prescription set using results from initial 6 min walk test and THRR while considering  patient's activity barriers and safety.   Exercise Prescription Goal: Initial exercise prescription builds to 30-45 minutes a day of aerobic activity, 2-3 days per week.  Home exercise guidelines will be given to patient during program as part of exercise prescription that the participant will acknowledge.  Activity Barriers & Risk Stratification:  Activity Barriers & Cardiac Risk Stratification - 10/17/23 1521       Activity Barriers & Cardiac Risk Stratification   Activity Barriers None    Cardiac Risk Stratification High   Under 5 MET's         6 Minute Walk:  6 Minute Walk     Row Name 10/17/23 1514         6 Minute Walk   Phase Initial     Distance 1575 feet     Walk Time 6 minutes     # of Rest Breaks 0     MPH 2.98     METS 2.67     RPE 9     Perceived Dyspnea  0     VO2 Peak 9.35     Symptoms Yes (comment)     Comments PAC's, asymptomatic     Resting HR 60 bpm     Resting BP 118/74     Resting Oxygen Saturation  98 %     Exercise Oxygen Saturation  during 6 min walk 99 %     Max Ex. HR 71 bpm     Max Ex. BP 150/56     2 Minute Post BP 128/60        Oxygen Initial Assessment:   Oxygen Re-Evaluation:   Oxygen Discharge (Final Oxygen Re-Evaluation):   Initial Exercise Prescription:  Initial Exercise Prescription - 10/17/23 1500       Date of Initial Exercise RX and Referring Provider   Date 10/17/23    Referring Provider Dr. Ozell Fell MD    Expected Discharge Date 01/08/24      Bike   Level 1    Watts 40    Minutes 15    METs 2.5      NuStep   Level 1    SPM 75    Minutes 15    METs 1.8      Prescription Details   Frequency (times per week) 3  Duration Progress to 30 minutes of  continuous aerobic without signs/symptoms of physical distress      Intensity   THRR 40-80% of Max Heartrate 57-114    Ratings of Perceived Exertion 11-13    Perceived Dyspnea 0-4      Progression   Progression Continue progressive overload as per policy without signs/symptoms or physical distress.      Resistance Training   Training Prescription Yes    Weight 4    Reps 10-15          Perform Capillary Blood Glucose checks as needed.  Exercise Prescription Changes:   Exercise Prescription Changes     Row Name 11/15/23 [phone]             Response to Exercise   Blood Pressure (Admit) 146/70       Blood Pressure (Exercise) 132/67       Blood Pressure (Exit) 108/54       Heart Rate (Admit) 63 bpm       Heart Rate (Exercise) 90 bpm       Heart Rate (Exit) 65 bpm       Rating of Perceived Exertion (Exercise) 11       Perceived Dyspnea (Exercise) 0       Symptoms 0       Comments Reviewed MET's, goals and home ExRx       Duration Progress to 30 minutes of  aerobic without signs/symptoms of physical distress       Intensity THRR unchanged         Progression   Progression Continue to progress workloads to maintain intensity without signs/symptoms of physical distress.       Average METs 3.15         Resistance Training   Training Prescription Yes       Weight 4       Reps 10-15       Time 10 Minutes         Bike   Level 2       Watts 35       Minutes 76       METs 3.2         NuStep   Level 2       SPM 132       Minutes 15       METs 3.1         Home Exercise Plan   Plans to continue exercise at Home (comment)       Frequency Add 2 additional days to program exercise sessions.       Initial Home Exercises Provided 11/15/23          Exercise Comments:   Exercise Comments     Row Name 10/21/23 9060 11/15/23 0831         Exercise Comments Pt first day in the [facility] I[facility] program. Pt tolerated exercise well with an average MET level of 2.5. He is  off to a good start and is learning his THRR, RPE and ExRx Reviewed MET's, goals and home ExRx. Pt tolerated exercise well with an average MET level of 3.15. He is doing well and progressing workloads and MET's. He feels good about his goals and is learning more about mindful wellness and is increasing endurance. He will add in exercise 1-2 days for 30-45 mins by walking, treadmill and stationary bike.         Exercise Goals and Review:   Exercise Goals  Row Name 10/17/23 0946             Exercise Goals   Increase Physical Activity Yes       Intervention Provide advice, education, support and counseling about physical activity/exercise needs.;Develop an individualized exercise prescription for aerobic and resistive training based on initial evaluation findings, risk stratification, comorbidities and participant's personal goals.       Expected Outcomes Short Term: Attend rehab on a regular basis to increase amount of physical activity.;Long Term: Exercising regularly at least 3-5 days a week.;Long Term: Add in home exercise to make exercise part of routine and to increase amount of physical activity.       Increase Strength and Stamina Yes       Intervention Provide advice, education, support and counseling about physical activity/exercise needs.;Develop an individualized exercise prescription for aerobic and resistive training based on initial evaluation findings, risk stratification, comorbidities and participant's personal goals.       Expected Outcomes Short Term: Increase workloads from initial exercise prescription for resistance, speed, and METs.;Short Term: Perform resistance training exercises routinely during rehab and add in resistance training at home;Long Term: Improve cardiorespiratory fitness, muscular endurance and strength as measured by increased METs and functional capacity ( )       Able to understand and use rate of perceived exertion (RPE) scale Yes        Intervention Provide education and explanation on how to use RPE scale       Expected Outcomes Short Term: Able to use RPE daily in rehab to express subjective intensity level;Long Term:  Able to use RPE to guide intensity level when exercising independently       Knowledge and understanding of Target Heart Rate Range (THRR) Yes       Intervention Provide education and explanation of THRR including how the numbers were predicted and where they are located for reference       Expected Outcomes Short Term: Able to state/look up THRR;Short Term: Able to use daily as guideline for intensity in rehab;Long Term: Able to use THRR to govern intensity when exercising independently       Understanding of Exercise Prescription Yes       Intervention Provide education, explanation, and written materials on patient's individual exercise prescription       Expected Outcomes Short Term: Able to explain program exercise prescription;Long Term: Able to explain home exercise prescription to exercise independently          Exercise Goals Re-Evaluation :  Exercise Goals Re-Evaluation     Row Name 10/21/23 9062 11/15/23 0825           Exercise Goal Re-Evaluation   Exercise Goals Review Increase Physical Activity;Understanding of Exercise Prescription;Increase Strength and Stamina;Knowledge and understanding of Target Heart Rate Range (THRR);Able to understand and use rate of perceived exertion (RPE) scale Increase Physical Activity;Understanding of Exercise Prescription;Increase Strength and Stamina;Knowledge and understanding of Target Heart Rate Range (THRR);Able to understand and use rate of perceived exertion (RPE) scale      Comments Pt first day in the [facility] program. Pt tolerated exercise well with an average MET level of 2.5. He is off to a good start and is learning his THRR, RPE and ExRx Reviewed MET's, goals and home ExRx. Pt tolerated exercise well with an average MET level of 3.15. He is doing  well and progressing workloads and MET's. He feels good about his goals and is learning more about mindful wellness and is increasing  endurance. He will add in exercise 1-2 days for 30-45 mins by walking, treadmill and stationary bike.      Expected Outcomes Will continue to monitor pt and progress workloads as tolerated without sign or symptom Will continue to monitor pt and progress workloads as tolerated without sign or symptom         Discharge Exercise Prescription (Final Exercise Prescription Changes):  Exercise Prescription Changes - 11/15/23 0814       Response to Exercise   Blood Pressure (Admit) 146/70    Blood Pressure (Exercise) 132/67    Blood Pressure (Exit) 108/54    Heart Rate (Admit) 63 bpm    Heart Rate (Exercise) 90 bpm    Heart Rate (Exit) 65 bpm    Rating of Perceived Exertion (Exercise) 11    Perceived Dyspnea (Exercise) 0    Symptoms 0    Comments Reviewed MET's, goals and home ExRx    Duration Progress to 30 minutes of  aerobic without signs/symptoms of physical distress    Intensity THRR unchanged      Progression   Progression Continue to progress workloads to maintain intensity without signs/symptoms of physical distress.    Average METs 3.15      Resistance Training   Training Prescription Yes    Weight 4    Reps 10-15    Time 10 Minutes      Bike   Level 2    Watts 35    Minutes 76    METs 3.2      NuStep   Level 2    SPM 132    Minutes 15    METs 3.1      Home Exercise Plan   Plans to continue exercise at Home (comment)    Frequency Add 2 additional days to program exercise sessions.    Initial Home Exercises Provided 11/15/23          Nutrition:  Target Goals: Understanding of nutrition guidelines, daily intake of sodium 1500mg , cholesterol 200mg , calories 30% from fat and 7% or less from saturated fats, daily to have 5 or more servings of fruits and vegetables.  Biometrics:  Pre Biometrics - 10/17/23 1510       Pre  Biometrics   Waist Circumference 40.5 inches    Hip Circumference 40 inches    Waist to Hip Ratio 1.01 %    Triceps Skinfold 19 mm    % Body Fat 29.6 %    Grip Strength 37 kg    Flexibility --   unable to reach   Single Leg Stand 30 seconds           Nutrition Therapy Plan and Nutrition Goals:  Nutrition Therapy & Goals - 11/18/23 0823       Nutrition Therapy   Diet Heart Healthy Diet    Drug/Food Interactions Statins/Certain Fruits      Personal Nutrition Goals   Nutrition Goal Patient to identify strategies for reducing cardiovascular risk by attending the [facility] education and nutrition series weekly.   goal in progress.   Personal Goal #2 Patient to improve diet quality by using the plate method as a guide for meal planning to include lean protein/plant protein, fruits, vegetables, whole grains, nonfat dairy as part of a well-balanced diet.   goal in progress.   Personal Goal #3 Patient to identify strategies for weight loss of 0.5-2.0# per week.   goal in progress.   Comments Goals in progress. Patient has medical history of  CAD s/p CABG, HTN, hyperlipidemia, CKD3, OSA, old MI.He continues to attend the [facility] education/nutrition series regularly. LDL at goal. He is followed by Nephrology; potassium historically elevated but now in acceptable range. Patient is motivated to lose weight; will continue to discuss strategies for weight loss including calorie density, the plate method as a guide for meal planning, label reading, etc.  He is down 3.5# since starting with our program. Patient will benefit from participation in intensive cardiac rehab for nutrition, exercise, and lifestyle modification.      Intervention Plan   Intervention Prescribe, educate and counsel regarding individualized specific dietary modifications aiming towards targeted core components such as weight, hypertension, lipid management, diabetes, heart failure and other comorbidities.;Nutrition handout(s) given  to patient.    Expected Outcomes Short Term Goal: Understand basic principles of dietary content, such as calories, fat, sodium, cholesterol and nutrients.;Long Term Goal: Adherence to prescribed nutrition plan.          Nutrition Assessments:  MEDIFICTS Score Key: >=70 Need to make dietary changes  40-70 Heart Healthy Diet <= 40 Therapeutic Level Cholesterol Diet   Flowsheet Row INTENSIVE CARDIAC REHAB from 10/21/2023 in [facility]  Picture Your Plate Total Score on Admission 67   Picture Your Plate Scores: <59 Unhealthy dietary pattern with much room for improvement. 41-50 Dietary pattern unlikely to meet recommendations for good health and room for improvement. 51-60 More healthful dietary pattern, with some room for improvement.  >60 Healthy dietary pattern, although there may be some specific behaviors that could be improved.    Nutrition Goals Re-Evaluation:  Nutrition Goals Re-Evaluation     Row Name 10/21/23 1036 11/18/23 0823           Goals   Current Weight 211 lb 3.2 oz (95.8 kg) 207 lb 3.7 oz (94 kg)      Comment Cr 1.94, GFR 35, LDL 61, HDL 43, Lpa 222 Cr 1.94, GFR 35, LDL 61, HDL 43, Lpa 222      Expected Outcome Patient has meidcal history of CAD s/p CABG, HTN, hyperlipidemia, CKD3, OSA, old MI. LDL at goal. He is followed by Nephrology; potassium was elevated but now in acceptable range. Patient is motivated to lose weight; will continue to discuss strategies for weight loss including calorie density, the plate method as a guide for meal planning, label reading, etc. Patient will benefit from participation in intensive cardiac rehab for nutrition, exercise, and lifestyle modification. Goals in progress. Patient has medical history of CAD s/p CABG, HTN, hyperlipidemia, CKD3, OSA, old MI.He continues to attend the [facility] education/nutrition series regularly. LDL at goal. He is followed by Nephrology;  potassium historically elevated but now in acceptable range. Patient is motivated to lose weight; will continue to discuss strategies for weight loss including calorie density, the plate method as a guide for meal planning, label reading, etc. He is down 3.5# since starting with our program. Patient will benefit from participation in intensive cardiac rehab for nutrition, exercise, and lifestyle modification.         Nutrition Goals Re-Evaluation:  Nutrition Goals Re-Evaluation     Row Name 10/21/23 1036 11/18/23 0823           Goals   Current Weight 211 lb 3.2 oz (95.8 kg) 207 lb 3.7 oz (94 kg)      Comment Cr 1.94, GFR 35, LDL 61, HDL 43, Lpa 222 Cr 1.94, GFR 35, LDL 61, HDL 43, Lpa  222      Expected Outcome Patient has meidcal history of CAD s/p CABG, HTN, hyperlipidemia, CKD3, OSA, old MI. LDL at goal. He is followed by Nephrology; potassium was elevated but now in acceptable range. Patient is motivated to lose weight; will continue to discuss strategies for weight loss including calorie density, the plate method as a guide for meal planning, label reading, etc. Patient will benefit from participation in intensive cardiac rehab for nutrition, exercise, and lifestyle modification. Goals in progress. Patient has medical history of CAD s/p CABG, HTN, hyperlipidemia, CKD3, OSA, old MI.He continues to attend the [facility] education/nutrition series regularly. LDL at goal. He is followed by Nephrology; potassium historically elevated but now in acceptable range. Patient is motivated to lose weight; will continue to discuss strategies for weight loss including calorie density, the plate method as a guide for meal planning, label reading, etc. He is down 3.5# since starting with our program. Patient will benefit from participation in intensive cardiac rehab for nutrition, exercise, and lifestyle modification.         Nutrition Goals Discharge (Final Nutrition Goals Re-Evaluation):  Nutrition Goals  Re-Evaluation - 11/18/23 9176       Goals   Current Weight 207 lb 3.7 oz (94 kg)    Comment Cr 1.94, GFR 35, LDL 61, HDL 43, Lpa 222    Expected Outcome Goals in progress. Patient has medical history of CAD s/p CABG, HTN, hyperlipidemia, CKD3, OSA, old MI.He continues to attend the [facility] education/nutrition series regularly. LDL at goal. He is followed by Nephrology; potassium historically elevated but now in acceptable range. Patient is motivated to lose weight; will continue to discuss strategies for weight loss including calorie density, the plate method as a guide for meal planning, label reading, etc. He is down 3.5# since starting with our program. Patient will benefit from participation in intensive cardiac rehab for nutrition, exercise, and lifestyle modification.          Psychosocial: Target Goals: Acknowledge presence or absence of significant depression and/or stress, maximize coping skills, provide positive support system. Participant is able to verbalize types and ability to use techniques and skills needed for reducing stress and depression.  Initial Review & Psychosocial Screening:  Initial Psych Review & Screening - 10/17/23 1331       Initial Review   Current issues with Current Stress Concerns    Source of Stress Concerns Family    Comments One of Mr Brook's  brother's recently passed away dealing with his estate      Family Dynamics   Good Support System? Yes   Mr Clerk has his wife and children for support and extended family     Barriers   Psychosocial barriers to participate in program There are no identifiable barriers or psychosocial needs.      Screening Interventions   Interventions Encouraged to exercise          Quality of Life Scores:  Quality of Life - 10/17/23 1525       Quality of Life   Select Quality of Life      Quality of Life Scores   Health/Function Pre 27.83 %    Socioeconomic Pre 28.07 %    Psych/Spiritual Pre 29.14 %     Family Pre 30 %    GLOBAL Pre 28.47 %         Scores of 19 and below usually indicate a poorer quality of life in these areas.  A difference of  2-3 points is a clinically meaningful difference.  A difference of 2-3 points in the total score of the Quality of Life Index has been associated with significant improvement in overall quality of life, self-image, physical symptoms, and general health in studies assessing change in quality of life.  PHQ-9: Review Flowsheet  More data exists      10/17/2023 04/11/2023 04/05/2022 03/23/2021 06/14/2020  Depression screen PHQ 2/9  Decreased Interest 0 0 0 0 0  Down, Depressed, Hopeless 0 0 0 0 0  PHQ - 2 Score 0 0 0 0 0  Altered sleeping 0 - - - -  Tired, decreased energy 1 - - - -  Change in appetite 0 - - - -  Feeling bad or failure about yourself  0 - - - -  Trouble concentrating 0 - - - -  Moving slowly or fidgety/restless 0 - - - -  Suicidal thoughts 0 - - - -  PHQ-9 Score 1 - - - -  Difficult doing work/chores Not difficult at all - - - -   Interpretation of Total Score  Total Score Depression Severity:  1-4 = Minimal depression, 5-9 = Mild depression, 10-14 = Moderate depression, 15-19 = Moderately severe depression, 20-27 = Severe depression   Psychosocial Evaluation and Intervention:   Psychosocial Re-Evaluation:  Psychosocial Re-Evaluation     Row Name 10/21/23 1654 11/12/23 1133           Psychosocial Re-Evaluation   Current issues with Current Stress Concerns Current Stress Concerns      Comments Zedrick Springsteen started cardiac rehab on 10/21/23. Christofer Shen did not voice any increased concerns or stressor on his first day of exercise. Atzin Buchta has not voiced any increased concerns or stressors during exercise at cardiac rehab.      Expected Outcomes Mitsuru Dault will  have controlled or decreased stressors upon completion of cardiac rehab Tyreek Clabo will  have controlled or decreased stressors upon completion of  cardiac rehab      Interventions Stress management education;Encouraged to attend Cardiac Rehabilitation for the exercise;Relaxation education Stress management education;Encouraged to attend Cardiac Rehabilitation for the exercise;Relaxation education      Continue Psychosocial Services  No Follow up required No Follow up required        Initial Review   Source of Stress Concerns Family Family      Comments Will continue to montior and offer supoprt as needed. Will continue to montior and offer supoprt as needed.         Psychosocial Discharge (Final Psychosocial Re-Evaluation):  Psychosocial Re-Evaluation - 11/12/23 1133       Psychosocial Re-Evaluation   Current issues with Current Stress Concerns    Comments Tayvin Preslar has not voiced any increased concerns or stressors during exercise at cardiac rehab.    Expected Outcomes Terre Zabriskie will  have controlled or decreased stressors upon completion of cardiac rehab    Interventions Stress management education;Encouraged to attend Cardiac Rehabilitation for the exercise;Relaxation education    Continue Psychosocial Services  No Follow up required      Initial Review   Source of Stress Concerns Family    Comments Will continue to montior and offer supoprt as needed.          Vocational Rehabilitation: Provide vocational rehab assistance to qualifying candidates.   Vocational Rehab Evaluation & Intervention:  Vocational Rehab - 10/17/23 1336       Initial Vocational Rehab Evaluation & Intervention   Assessment  shows need for Vocational Rehabilitation No   Mr Pablo is semi retired and does not need vocational rehab at this time         Education: Education Goals: Education classes will be provided on a weekly basis, covering required topics. Participant will state understanding/return demonstration of topics presented.    Education     Row Name 10/21/23 0800     Education   Cardiac Education Topics Pritikin    Select Workshops     Workshops   Educator Exercise Physiologist   Select Psychosocial   Psychosocial Workshop Focused Goals, Sustainable Changes   Instruction Review Code 1- Verbalizes Understanding   Class Start Time 0815   Class Stop Time 0850   Class Time Calculation (min) 35 min    Row Name 10/23/23 0900     Education   Cardiac Education Topics Pritikin   Orthoptist   Educator Dietitian   Weekly Topic Comforting Weekend Breakfasts   Instruction Review Code 1- Verbalizes Understanding   Class Start Time [phone]   Class Stop Time 0845   Class Time Calculation (min) 35 min    Row Name 10/25/23 0800     Education   Cardiac Education Topics Pritikin   Select Core Videos     Core Videos   Educator Exercise Physiologist   Select Nutrition   Nutrition Dining Out - Part 1   Instruction Review Code 1- Verbalizes Understanding   Class Start Time 0820   Class Stop Time 0855   Class Time Calculation (min) 35 min    Row Name 10/28/23 0900     Education   Cardiac Education Topics Pritikin   Select Core Videos     Core Videos   Educator Exercise Physiologist   Select Exercise Education   Exercise Education Biomechanial Limitations   Instruction Review Code 1- Verbalizes Understanding   Class Start Time 0815   Class Stop Time 0847   Class Time Calculation (min) 32 min    Row Name 10/30/23 1000     Education   Cardiac Education Topics Pritikin   Secondary school teacher School   Educator Dietitian   Weekly Topic Fast Evening Meals   Instruction Review Code 1- Verbalizes Understanding   Class Start Time 0815   Class Stop Time 0856   Class Time Calculation (min) 41 min    Row Name 11/01/23 0800     Education   Cardiac Education Topics Pritikin   Select Core Videos     Core Videos   Educator Dietitian   Select Nutrition   Nutrition Vitamins and Minerals   Instruction Review Code 1- Verbalizes Understanding   Class  Start Time 0815   Class Stop Time 0905   Class Time Calculation (min) 50 min    Row Name 11/04/23 0800     Education   Cardiac Education Topics Pritikin   Select Workshops     Workshops   Educator Dietitian   Select Nutrition   Nutrition Workshop Fueling a Forensic psychologist   Instruction Review Code 1- Verbalizes Understanding   Class Start Time 0815   Class Stop Time 0850   Class Time Calculation (min) 35 min    Row Name 11/06/23 0900     Education   Cardiac Education Topics Pritikin   Customer service manager   Weekly Topic International Cuisine- Spotlight on the Blue  Zones   Instruction Review Code 1- Verbalizes Understanding   Class Start Time 0815   Class Stop Time 0850   Class Time Calculation (min) 35 min    Row Name 11/11/23 0800     Education   Cardiac Education Topics Pritikin   Geographical information systems officer Psychosocial   Psychosocial Workshop Healthy Sleep for a Healthy Heart   Instruction Review Code 1- Verbalizes Understanding   Class Start Time 0815   Class Stop Time 0900   Class Time Calculation (min) 45 min    Row Name 11/13/23 0900     Education   Cardiac Education Topics Pritikin   Secondary school teacher School   Educator Dietitian   Weekly Topic Simple Sides and Sauces   Instruction Review Code 1- Verbalizes Understanding   Class Start Time 0815   Class Stop Time 0846   Class Time Calculation (min) 31 min    Row Name 11/15/23 0900     Education   Cardiac Education Topics Pritikin   Select Core Videos     Core Videos   Educator Exercise Physiologist   Select Psychosocial   Psychosocial How Our Thoughts Can Heal Our Hearts   Instruction Review Code 1- Verbalizes Understanding   Class Start Time 0815   Class Stop Time 0847   Class Time Calculation (min) 32 min    Row Name 11/18/23 0800     Education   Cardiac Education Topics Pritikin    Select Core Videos     Core Videos   Educator Exercise Physiologist   Select General Education   General Education Hypertension and Heart Disease   Instruction Review Code 1- Verbalizes Understanding   Class Start Time 0815   Class Stop Time 0851   Class Time Calculation (min) 36 min      Core Videos: Exercise    Move It!  Clinical staff conducted group or individual video education with verbal and written material and guidebook.  Patient learns the recommended Pritikin exercise program. Exercise with the goal of living a long, healthy life. Some of the health benefits of exercise include controlled diabetes, healthier blood pressure levels, improved cholesterol levels, improved heart and lung capacity, improved sleep, and better body composition. Everyone should speak with their doctor before starting or changing an exercise routine.  Biomechanical Limitations Clinical staff conducted group or individual video education with verbal and written material and guidebook.  Patient learns how biomechanical limitations can impact exercise and how we can mitigate and possibly overcome limitations to have an impactful and balanced exercise routine.  Body Composition Clinical staff conducted group or individual video education with verbal and written material and guidebook.  Patient learns that body composition (ratio of muscle mass to fat mass) is a key component to assessing overall fitness, rather than body weight alone. Increased fat mass, especially visceral belly fat, can put us  at increased risk for metabolic syndrome, type 2 diabetes, heart disease, and even death. It is recommended to combine diet and exercise (cardiovascular and resistance training) to improve your body composition. Seek guidance from your physician and exercise physiologist before implementing an exercise routine.  Exercise Action Plan Clinical staff conducted group or individual video education with verbal and  written material and guidebook.  Patient learns the recommended strategies to achieve and enjoy long-term exercise adherence, including variety, self-motivation, self-efficacy, and positive decision making. Benefits of exercise include fitness,  good health, weight management, more energy, better sleep, less stress, and overall well-being.  Medical   Heart Disease Risk Reduction Clinical staff conducted group or individual video education with verbal and written material and guidebook.  Patient learns our heart is our most vital organ as it circulates oxygen, nutrients, white blood cells, and hormones throughout the entire body, and carries waste away. Data supports a plant-based eating plan like the Pritikin Program for its effectiveness in slowing progression of and reversing heart disease. The video provides a number of recommendations to address heart disease.   Metabolic Syndrome and Belly Fat  Clinical staff conducted group or individual video education with verbal and written material and guidebook.  Patient learns what metabolic syndrome is, how it leads to heart disease, and how one can reverse it and keep it from coming back. You have metabolic syndrome if you have 3 of the following 5 criteria: abdominal obesity, high blood pressure, high triglycerides, low HDL cholesterol, and high blood sugar.  Hypertension and Heart Disease Clinical staff conducted group or individual video education with verbal and written material and guidebook.  Patient learns that high blood pressure, or hypertension, is very common in the [place] . Hypertension is largely due to excessive salt intake, but other important risk factors include being overweight, physical inactivity, drinking too much alcohol, smoking, and not eating enough potassium from fruits and vegetables. High blood pressure is a leading risk factor for heart attack, stroke, congestive heart failure, dementia, kidney failure, and premature  death. Long-term effects of excessive salt intake include stiffening of the arteries and thickening of heart muscle and organ damage. Recommendations include ways to reduce hypertension and the risk of heart disease.  Diseases of Our Time - Focusing on Diabetes Clinical staff conducted group or individual video education with verbal and written material and guidebook.  Patient learns why the best way to stop diseases of our time is prevention, through food and other lifestyle changes. Medicine (such as prescription pills and surgeries) is often only a Band-Aid on the problem, not a long-term solution. Most common diseases of our time include obesity, type 2 diabetes, hypertension, heart disease, and cancer. The Pritikin Program is recommended and has been proven to help reduce, reverse, and/or prevent the damaging effects of metabolic syndrome.  Nutrition   Overview of the Pritikin Eating Plan  Clinical staff conducted group or individual video education with verbal and written material and guidebook.  Patient learns about the Pritikin Eating Plan for disease risk reduction. The Pritikin Eating Plan emphasizes a wide variety of unrefined, minimally-processed carbohydrates, like fruits, vegetables, whole grains, and legumes. Go, Caution, and Stop food choices are explained. Plant-based and lean animal proteins are emphasized. Rationale provided for low sodium intake for blood pressure control, low added sugars for blood sugar stabilization, and low added fats and oils for coronary artery disease risk reduction and weight management.  Calorie Density  Clinical staff conducted group or individual video education with verbal and written material and guidebook.  Patient learns about calorie density and how it impacts the Pritikin Eating Plan. Knowing the characteristics of the food you choose will help you decide whether those foods will lead to weight gain or weight loss, and whether you want to consume  more or less of them. Weight loss is usually a side effect of the Pritikin Eating Plan because of its focus on low calorie-dense foods.  Label Reading  Clinical staff conducted group or individual video education with verbal and  written material and guidebook.  Patient learns about the Pritikin recommended label reading guidelines and corresponding recommendations regarding calorie density, added sugars, sodium content, and whole grains.  Dining Out - Part 1  Clinical staff conducted group or individual video education with verbal and written material and guidebook.  Patient learns that restaurant meals can be sabotaging because they can be so high in calories, fat, sodium, and/or sugar. Patient learns recommended strategies on how to positively address this and avoid unhealthy pitfalls.  Facts on Fats  Clinical staff conducted group or individual video education with verbal and written material and guidebook.  Patient learns that lifestyle modifications can be just as effective, if not more so, as many medications for lowering your risk of heart disease. A Pritikin lifestyle can help to reduce your risk of inflammation and atherosclerosis (cholesterol build-up, or plaque, in the artery walls). Lifestyle interventions such as dietary choices and physical activity address the cause of atherosclerosis. A review of the types of fats and their impact on blood cholesterol levels, along with dietary recommendations to reduce fat intake is also included.  Nutrition Action Plan  Clinical staff conducted group or individual video education with verbal and written material and guidebook.  Patient learns how to incorporate Pritikin recommendations into their lifestyle. Recommendations include planning and keeping personal health goals in mind as an important part of their success.  Healthy Mind-Set    Healthy Minds, Bodies, Hearts  Clinical staff conducted group or individual video education with verbal and  written material and guidebook.  Patient learns how to identify when they are stressed. Video will discuss the impact of that stress, as well as the many benefits of stress management. Patient will also be introduced to stress management techniques. The way we think, act, and feel has an impact on our hearts.  How Our Thoughts Can Heal Our Hearts  Clinical staff conducted group or individual video education with verbal and written material and guidebook.  Patient learns that negative thoughts can cause depression and anxiety. This can result in negative lifestyle behavior and serious health problems. Cognitive behavioral therapy is an effective method to help control our thoughts in order to change and improve our emotional outlook.  Additional Videos:  Exercise    Improving Performance  Clinical staff conducted group or individual video education with verbal and written material and guidebook.  Patient learns to use a non-linear approach by alternating intensity levels and lengths of time spent exercising to help burn more calories and lose more body fat. Cardiovascular exercise helps improve heart health, metabolism, hormonal balance, blood sugar control, and recovery from fatigue. Resistance training improves strength, endurance, balance, coordination, reaction time, metabolism, and muscle mass. Flexibility exercise improves circulation, posture, and balance. Seek guidance from your physician and exercise physiologist before implementing an exercise routine and learn your capabilities and proper form for all exercise.  Introduction to Yoga  Clinical staff conducted group or individual video education with verbal and written material and guidebook.  Patient learns about yoga, a discipline of the coming together of mind, breath, and body. The benefits of yoga include improved flexibility, improved range of motion, better posture and core strength, increased lung function, weight loss, and positive  self-image. Yoga's heart health benefits include lowered blood pressure, healthier heart rate, decreased cholesterol and triglyceride levels, improved immune function, and reduced stress. Seek guidance from your physician and exercise physiologist before implementing an exercise routine and learn your capabilities and proper form for all exercise.  Medical   Aging: Enhancing Your Quality of Life  Clinical staff conducted group or individual video education with verbal and written material and guidebook.  Patient learns key strategies and recommendations to stay in good physical health and enhance quality of life, such as prevention strategies, having an advocate, securing a Health Care Proxy and Power of Attorney, and keeping a list of medications and system for tracking them. It also discusses how to avoid risk for bone loss.  Biology of Weight Control  Clinical staff conducted group or individual video education with verbal and written material and guidebook.  Patient learns that weight gain occurs because we consume more calories than we burn (eating more, moving less). Even if your body weight is normal, you may have higher ratios of fat compared to muscle mass. Too much body fat puts you at increased risk for cardiovascular disease, heart attack, stroke, type 2 diabetes, and obesity-related cancers. In addition to exercise, following the Pritikin Eating Plan can help reduce your risk.  Decoding Lab Results  Clinical staff conducted group or individual video education with verbal and written material and guidebook.  Patient learns that lab test reflects one measurement whose values change over time and are influenced by many factors, including medication, stress, sleep, exercise, food, hydration, pre-existing medical conditions, and more. It is recommended to use the knowledge from this video to become more involved with your lab results and evaluate your numbers to speak with your  doctor.   Diseases of Our Time - Overview  Clinical staff conducted group or individual video education with verbal and written material and guidebook.  Patient learns that according to the CDC, 50% to 70% of chronic diseases (such as obesity, type 2 diabetes, elevated lipids, hypertension, and heart disease) are avoidable through lifestyle improvements including healthier food choices, listening to satiety cues, and increased physical activity.  Sleep Disorders Clinical staff conducted group or individual video education with verbal and written material and guidebook.  Patient learns how good quality and duration of sleep are important to overall health and well-being. Patient also learns about sleep disorders and how they impact health along with recommendations to address them, including discussing with a physician.  Nutrition  Dining Out - Part 2 Clinical staff conducted group or individual video education with verbal and written material and guidebook.  Patient learns how to plan ahead and communicate in order to maximize their dining experience in a healthy and nutritious manner. Included are recommended food choices based on the type of restaurant the patient is visiting.   Fueling a Banker conducted group or individual video education with verbal and written material and guidebook.  There is a strong connection between our food choices and our health. Diseases like obesity and type 2 diabetes are very prevalent and are in large-part due to lifestyle choices. The Pritikin Eating Plan provides plenty of food and hunger-curbing satisfaction. It is easy to follow, affordable, and helps reduce health risks.  Menu Workshop  Clinical staff conducted group or individual video education with verbal and written material and guidebook.  Patient learns that restaurant meals can sabotage health goals because they are often packed with calories, fat, sodium, and sugar.  Recommendations include strategies to plan ahead and to communicate with the manager, chef, or server to help order a healthier meal.  Planning Your Eating Strategy  Clinical staff conducted group or individual video education with verbal and written material and guidebook.  Patient learns about the  Pritikin Eating Plan and its benefit of reducing the risk of disease. The Pritikin Eating Plan does not focus on calories. Instead, it emphasizes high-quality, nutrient-rich foods. By knowing the characteristics of the foods, we choose, we can determine their calorie density and make informed decisions.  Targeting Your Nutrition Priorities  Clinical staff conducted group or individual video education with verbal and written material and guidebook.  Patient learns that lifestyle habits have a tremendous impact on disease risk and progression. This video provides eating and physical activity recommendations based on your personal health goals, such as reducing LDL cholesterol, losing weight, preventing or controlling type 2 diabetes, and reducing high blood pressure.  Vitamins and Minerals  Clinical staff conducted group or individual video education with verbal and written material and guidebook.  Patient learns different ways to obtain key vitamins and minerals, including through a recommended healthy diet. It is important to discuss all supplements you take with your doctor.   Healthy Mind-Set    Smoking Cessation  Clinical staff conducted group or individual video education with verbal and written material and guidebook.  Patient learns that cigarette smoking and tobacco addiction pose a serious health risk which affects millions of people. Stopping smoking will significantly reduce the risk of heart disease, lung disease, and many forms of cancer. Recommended strategies for quitting are covered, including working with your doctor to develop a successful plan.  Culinary   Becoming a Corporate investment banker conducted group or individual video education with verbal and written material and guidebook.  Patient learns that cooking at home can be healthy, cost-effective, quick, and puts them in control. Keys to cooking healthy recipes will include looking at your recipe, assessing your equipment needs, planning ahead, making it simple, choosing cost-effective seasonal ingredients, and limiting the use of added fats, salts, and sugars.  Cooking - Breakfast and Snacks  Clinical staff conducted group or individual video education with verbal and written material and guidebook.  Patient learns how important breakfast is to satiety and nutrition through the entire day. Recommendations include key foods to eat during breakfast to help stabilize blood sugar levels and to prevent overeating at meals later in the day. Planning ahead is also a key component.  Cooking - Educational psychologist conducted group or individual video education with verbal and written material and guidebook.  Patient learns eating strategies to improve overall health, including an approach to cook more at home. Recommendations include thinking of animal protein as a side on your plate rather than center stage and focusing instead on lower calorie dense options like vegetables, fruits, whole grains, and plant-based proteins, such as beans. Making sauces in large quantities to freeze for later and leaving the skin on your vegetables are also recommended to maximize your experience.  Cooking - Healthy Salads and Dressing Clinical staff conducted group or individual video education with verbal and written material and guidebook.  Patient learns that vegetables, fruits, whole grains, and legumes are the foundations of the Pritikin Eating Plan. Recommendations include how to incorporate each of these in flavorful and healthy salads, and how to create homemade salad dressings. Proper handling of ingredients is also covered.  Cooking - Soups and [employer] - Soups and Desserts Clinical staff conducted group or individual video education with verbal and written material and guidebook.  Patient learns that Pritikin soups and desserts make for easy, nutritious, and delicious snacks and meal components that are low in sodium, fat, sugar,  and calorie density, while high in vitamins, minerals, and filling fiber. Recommendations include simple and healthy ideas for soups and desserts.   Overview     The Pritikin Solution Program Overview Clinical staff conducted group or individual video education with verbal and written material and guidebook.  Patient learns that the results of the Pritikin Program have been documented in more than 100 articles published in peer-reviewed journals, and the benefits include reducing risk factors for (and, in some cases, even reversing) high cholesterol, high blood pressure, type 2 diabetes, obesity, and more! An overview of the three key pillars of the Pritikin Program will be covered: eating well, doing regular exercise, and having a healthy mind-set.  WORKSHOPS  Exercise: Exercise Basics: Building Your Action Plan Clinical staff led group instruction and group discussion with PowerPoint presentation and patient guidebook. To enhance the learning environment the use of posters, models and videos may be added. At the conclusion of this workshop, patients will comprehend the difference between physical activity and exercise, as well as the benefits of incorporating both, into their routine. Patients will understand the FITT (Frequency, Intensity, Time, and Type) principle and how to use it to build an exercise action plan. In addition, safety concerns and other considerations for exercise and cardiac rehab will be addressed by the presenter. The purpose of this lesson is to promote a comprehensive and effective weekly exercise routine in order to improve patients' overall level of  fitness.   Managing Heart Disease: Your Path to a Healthier Heart Clinical staff led group instruction and group discussion with PowerPoint presentation and patient guidebook. To enhance the learning environment the use of posters, models and videos may be added.At the conclusion of this workshop, patients will understand the anatomy and physiology of the heart. Additionally, they will understand how Pritikin's three pillars impact the risk factors, the progression, and the management of heart disease.  The purpose of this lesson is to provide a high-level overview of the heart, heart disease, and how the Pritikin lifestyle positively impacts risk factors.  Exercise Biomechanics Clinical staff led group instruction and group discussion with PowerPoint presentation and patient guidebook. To enhance the learning environment the use of posters, models and videos may be added. Patients will learn how the structural parts of their bodies function and how these functions impact their daily activities, movement, and exercise. Patients will learn how to promote a neutral spine, learn how to manage pain, and identify ways to improve their physical movement in order to promote healthy living. The purpose of this lesson is to expose patients to common physical limitations that impact physical activity. Participants will learn practical ways to adapt and manage aches and pains, and to minimize their effect on regular exercise. Patients will learn how to maintain good posture while sitting, walking, and lifting.  Balance Training and Fall Prevention  Clinical staff led group instruction and group discussion with PowerPoint presentation and patient guidebook. To enhance the learning environment the use of posters, models and videos may be added. At the conclusion of this workshop, patients will understand the importance of their sensorimotor skills (vision, proprioception, and the vestibular system)  in maintaining their ability to balance as they age. Patients will apply a variety of balancing exercises that are appropriate for their current level of function. Patients will understand the common causes for poor balance, possible solutions to these problems, and ways to modify their physical environment in order to minimize their fall risk. The purpose of this lesson  is to teach patients about the importance of maintaining balance as they age and ways to minimize their risk of falling.  WORKSHOPS   Nutrition:  Fueling a Ship broker led group instruction and group discussion with PowerPoint presentation and patient guidebook. To enhance the learning environment the use of posters, models and videos may be added. Patients will review the foundational principles of the Pritikin Eating Plan and understand what constitutes a serving size in each of the food groups. Patients will also learn Pritikin-friendly foods that are better choices when away from home and review make-ahead meal and snack options. Calorie density will be reviewed and applied to three nutrition priorities: weight maintenance, weight loss, and weight gain. The purpose of this lesson is to reinforce (in a group setting) the key concepts around what patients are recommended to eat and how to apply these guidelines when away from home by planning and selecting Pritikin-friendly options. Patients will understand how calorie density may be adjusted for different weight management goals.  Mindful Eating  Clinical staff led group instruction and group discussion with PowerPoint presentation and patient guidebook. To enhance the learning environment the use of posters, models and videos may be added. Patients will briefly review the concepts of the Pritikin Eating Plan and the importance of low-calorie dense foods. The concept of mindful eating will be introduced as well as the importance of paying attention to internal hunger  signals. Triggers for non-hunger eating and techniques for dealing with triggers will be explored. The purpose of this lesson is to provide patients with the opportunity to review the basic principles of the Pritikin Eating Plan, discuss the value of eating mindfully and how to measure internal cues of hunger and fullness using the Hunger Scale. Patients will also discuss reasons for non-hunger eating and learn strategies to use for controlling emotional eating.  Targeting Your Nutrition Priorities Clinical staff led group instruction and group discussion with PowerPoint presentation and patient guidebook. To enhance the learning environment the use of posters, models and videos may be added. Patients will learn how to determine their genetic susceptibility to disease by reviewing their family history. Patients will gain insight into the importance of diet as part of an overall healthy lifestyle in mitigating the impact of genetics and other environmental insults. The purpose of this lesson is to provide patients with the opportunity to assess their personal nutrition priorities by looking at their family history, their own health history and current risk factors. Patients will also be able to discuss ways of prioritizing and modifying the Pritikin Eating Plan for their highest risk areas  Menu  Clinical staff led group instruction and group discussion with PowerPoint presentation and patient guidebook. To enhance the learning environment the use of posters, models and videos may be added. Using menus brought in from [employer], or printed from [employer], patients will apply the Pritikin dining out guidelines that were presented in the [employer] video. Patients will also be able to practice these guidelines in a variety of provided scenarios. The purpose of this lesson is to provide patients with the opportunity to practice hands-on learning of the Pritikin Dining Out guidelines  with actual menus and practice scenarios.  Label Reading Clinical staff led group instruction and group discussion with PowerPoint presentation and patient guidebook. To enhance the learning environment the use of posters, models and videos may be added. Patients will review and discuss the Pritikin label reading guidelines presented in Pritikin's Label  Reading Educational series video. Using fool labels brought in from local grocery stores and markets, patients will apply the label reading guidelines and determine if the packaged food meet the Pritikin guidelines. The purpose of this lesson is to provide patients with the opportunity to review, discuss, and practice hands-on learning of the Pritikin Label Reading guidelines with actual packaged food labels. Cooking School  Pritikin's [employer] are designed to teach patients ways to prepare quick, simple, and affordable recipes at home. The importance of nutrition's role in chronic disease risk reduction is reflected in its emphasis in the overall Pritikin program. By learning how to prepare essential core Pritikin Eating Plan recipes, patients will increase control over what they eat; be able to customize the flavor of foods without the use of added salt, sugar, or fat; and improve the quality of the food they consume. By learning a set of core recipes which are easily assembled, quickly prepared, and affordable, patients are more likely to prepare more healthy foods at home. These workshops focus on convenient breakfasts, simple entres, side dishes, and desserts which can be prepared with minimal effort and are consistent with nutrition recommendations for cardiovascular risk reduction. Cooking [employer] are taught by a Armed forces logistics/support/administrative officer (RD) who has been trained by the [employer]. The chef or RD has a clear understanding of the importance of minimizing - if not completely eliminating - added fat, sugar, and  sodium in recipes. Throughout the series of Cooking School Workshop sessions, patients will learn about healthy ingredients and efficient methods of cooking to build confidence in their capability to prepare    Cooking School weekly topics:  Adding Flavor- Sodium-Free  Fast and Healthy Breakfasts  Powerhouse Plant-Based Proteins  Satisfying Salads and Dressings  Simple Sides and Sauces  International Cuisine-Spotlight on the [employer] Zones  Delicious Desserts  Savory Soups  [employer] - Meals in a Astronomer Appetizers and Snacks  Comforting Weekend Breakfasts  One-Pot Wonders   Fast Evening Meals  Landscape architect Your Pritikin Plate  WORKSHOPS   Healthy Mindset (Psychosocial):  Focused Goals, Sustainable Changes Clinical staff led group instruction and group discussion with PowerPoint presentation and patient guidebook. To enhance the learning environment the use of posters, models and videos may be added. Patients will be able to apply effective goal setting strategies to establish at least one personal goal, and then take consistent, meaningful action toward that goal. They will learn to identify common barriers to achieving personal goals and develop strategies to overcome them. Patients will also gain an understanding of how our mind-set can impact our ability to achieve goals and the importance of cultivating a positive and growth-oriented mind-set. The purpose of this lesson is to provide patients with a deeper understanding of how to set and achieve personal goals, as well as the tools and strategies needed to overcome common obstacles which may arise along the way.  From Head to Heart: The Power of a Healthy Outlook  Clinical staff led group instruction and group discussion with PowerPoint presentation and patient guidebook. To enhance the learning environment the use of posters, models and videos may be added. Patients will be able to recognize and  describe the impact of emotions and mood on physical health. They will discover the importance of self-care and explore self-care practices which may work for them. Patients will also learn how to utilize the 4 C's to cultivate a healthier outlook and better manage stress  and challenges. The purpose of this lesson is to demonstrate to patients how a healthy outlook is an essential part of maintaining good health, especially as they continue their cardiac rehab journey.  Healthy Sleep for a Healthy Heart Clinical staff led group instruction and group discussion with PowerPoint presentation and patient guidebook. To enhance the learning environment the use of posters, models and videos may be added. At the conclusion of this workshop, patients will be able to demonstrate knowledge of the importance of sleep to overall health, well-being, and quality of life. They will understand the symptoms of, and treatments for, common sleep disorders. Patients will also be able to identify daytime and nighttime behaviors which impact sleep, and they will be able to apply these tools to help manage sleep-related challenges. The purpose of this lesson is to provide patients with a general overview of sleep and outline the importance of quality sleep. Patients will learn about a few of the most common sleep disorders. Patients will also be introduced to the concept of "sleep hygiene," and discover ways to self-manage certain sleeping problems through simple daily behavior changes. Finally, the workshop will motivate patients by clarifying the links between quality sleep and their goals of heart-healthy living.   Recognizing and Reducing Stress Clinical staff led group instruction and group discussion with PowerPoint presentation and patient guidebook. To enhance the learning environment the use of posters, models and videos may be added. At the conclusion of this workshop, patients will be able to understand the types of stress  reactions, differentiate between acute and chronic stress, and recognize the impact that chronic stress has on their health. They will also be able to apply different coping mechanisms, such as reframing negative self-talk. Patients will have the opportunity to practice a variety of stress management techniques, such as deep abdominal breathing, progressive muscle relaxation, and/or guided imagery.  The purpose of this lesson is to educate patients on the role of stress in their lives and to provide healthy techniques for coping with it.  Learning Barriers/Preferences:  Learning Barriers/Preferences - 10/17/23 1335       Learning Barriers/Preferences   Learning Barriers Sight   wears glasses   Learning Preferences Pictoral;Video          Education Topics:  Knowledge Questionnaire Score:  Knowledge Questionnaire Score - 10/17/23 1526       Knowledge Questionnaire Score   Pre Score 23/24          Core Components/Risk Factors/Patient Goals at Admission:  Personal Goals and Risk Factors at Admission - 10/17/23 1528       Core Components/Risk Factors/Patient Goals on Admission    Weight Management Yes;Weight Loss    Intervention Weight Management: Develop a combined nutrition and exercise program designed to reach desired caloric intake, while maintaining appropriate intake of nutrient and fiber, sodium and fats, and appropriate energy expenditure required for the weight goal.;Weight Management: Provide education and appropriate resources to help participant work on and attain dietary goals.    Admit Weight 210 lb 12.2 oz (95.6 kg)    Expected Outcomes Short Term: Continue to assess and modify interventions until short term weight is achieved;Long Term: Adherence to nutrition and physical activity/exercise program aimed toward attainment of established weight goal;Weight Loss: Understanding of general recommendations for a balanced deficit meal plan, which promotes 1-2 lb weight loss  per week and includes a negative energy balance of [phone] kcal/d;Understanding recommendations for meals to include 15-35% energy as protein, 25-35% energy from fat,  35-60% energy from carbohydrates, less than 200mg  of dietary cholesterol, 20-35 gm of total fiber daily;Understanding of distribution of calorie intake throughout the day with the consumption of 4-5 meals/snacks    Hypertension Yes    Intervention Provide education on lifestyle modifcations including regular physical activity/exercise, weight management, moderate sodium restriction and increased consumption of fresh fruit, vegetables, and low fat dairy, alcohol moderation, and smoking cessation.;Monitor prescription use compliance.    Expected Outcomes Short Term: Continued assessment and intervention until BP is < 140/31mm HG in hypertensive participants. < 130/24mm HG in hypertensive participants with diabetes, heart failure or chronic kidney disease.;Long Term: Maintenance of blood pressure at goal levels.    Lipids Yes    Intervention Provide education and support for participant on nutrition & aerobic/resistive exercise along with prescribed medications to achieve LDL 70mg , HDL >40mg .    Expected Outcomes Short Term: Participant states understanding of desired cholesterol values and is compliant with medications prescribed. Participant is following exercise prescription and nutrition guidelines.;Long Term: Cholesterol controlled with medications as prescribed, with individualized exercise RX and with personalized nutrition plan. Value goals: LDL < 70mg , HDL > 40 mg.    Stress Yes    Intervention Offer individual and/or small group education and counseling on adjustment to heart disease, stress management and health-related lifestyle change. Teach and support self-help strategies.;Refer participants experiencing significant psychosocial distress to appropriate mental health specialists for further evaluation and treatment. When possible,  include family members and significant others in education/counseling sessions.    Expected Outcomes Short Term: Participant demonstrates changes in health-related behavior, relaxation and other stress management skills, ability to obtain effective social support, and compliance with psychotropic medications if prescribed.;Long Term: Emotional wellbeing is indicated by absence of clinically significant psychosocial distress or social isolation.    Personal Goal Other Yes    Personal Goal Have the mind relax more, wants waist size at 36 inches, endurance    Intervention Will continue to monitor pt and progress workloads as tolerated without sign or symptom    Expected Outcomes Pt will achieve his goals          Core Components/Risk Factors/Patient Goals Review:   Goals and Risk Factor Review     Row Name 10/21/23 1657 11/12/23 1135           Core Components/Risk Factors/Patient Goals Review   Personal Goals Review Weight Management/Obesity;Hypertension;Lipids;Stress Weight Management/Obesity;Hypertension;Lipids;Stress      Review Calix Heinbaugh started cardiac rehab on 10/21/23. Juliene Sprang did well with exercise. Vital signs were stable Dajaun Goldring is doing well with exercise at cardiac rehab. Vital signs have been stable. Loc Feinstein has increased his met levels.      Expected Outcomes Fuller Makin will continue to particpate in cardiac rehab for exercise, nutrition and lifestyle modifications Khy Pitre will continue to particpate in cardiac rehab for exercise, nutrition and lifestyle modifications         Core Components/Risk Factors/Patient Goals at Discharge (Final Review):   Goals and Risk Factor Review - 11/12/23 1135       Core Components/Risk Factors/Patient Goals Review   Personal Goals Review Weight Management/Obesity;Hypertension;Lipids;Stress    Review Gavin Telford is doing well with exercise at cardiac rehab. Vital signs have been stable. Aundre Hietala has  increased his met levels.    Expected Outcomes Randolf Sansoucie will continue to particpate in cardiac rehab for exercise, nutrition and lifestyle modifications          ITP Comments:  ITP Comments  Row Name 10/17/23 0945 10/21/23 1652 11/12/23 1132       ITP Comments Wilbert Bihari, MD: Medical Director.  Intorduction to the [facility]/ Intensive Caridac Rehab.  Initial orientation packet reviewed with the patient. 30 Day ITP Review. Rodolph Hagemann started cardiac rehab on 10/21/23 30 Day ITP Review. Ramez Arrona has good attendance and participation with exercise at  cardiac rehab        Comments: See ITP comments.Hadassah Elpidio Quan RN BSN

## 2023-11-20 ENCOUNTER — Encounter (HOSPITAL_COMMUNITY)
Admission: RE | Admit: 2023-11-20 | Discharge: 2023-11-20 | Disposition: A | Discharge status: Home / Self Care | Source: Ambulatory Visit | Attending: Cardiovascular Disease

## 2023-11-20 DIAGNOSIS — Z48812 Encounter for surgical aftercare following surgery on the circulatory system: Secondary | ICD-10-CM | POA: Diagnosis not present

## 2023-11-20 DIAGNOSIS — Z955 Presence of coronary angioplasty implant and graft: Secondary | ICD-10-CM | POA: Diagnosis not present

## 2023-11-22 ENCOUNTER — Encounter (HOSPITAL_COMMUNITY): Admission: RE | Admit: 2023-11-22 | Source: Ambulatory Visit

## 2023-11-25 ENCOUNTER — Encounter (HOSPITAL_COMMUNITY)
Admission: RE | Admit: 2023-11-25 | Discharge: 2023-11-25 | Disposition: A | Discharge status: Home / Self Care | Source: Ambulatory Visit | Attending: Cardiovascular Disease

## 2023-11-25 DIAGNOSIS — Z955 Presence of coronary angioplasty implant and graft: Secondary | ICD-10-CM | POA: Diagnosis not present

## 2023-11-25 DIAGNOSIS — Z48812 Encounter for surgical aftercare following surgery on the circulatory system: Secondary | ICD-10-CM | POA: Diagnosis not present

## 2023-11-27 ENCOUNTER — Encounter (HOSPITAL_COMMUNITY)
Admission: RE | Admit: 2023-11-27 | Discharge: 2023-11-27 | Disposition: A | Discharge status: Home / Self Care | Source: Ambulatory Visit | Attending: Cardiovascular Disease

## 2023-11-27 DIAGNOSIS — Z955 Presence of coronary angioplasty implant and graft: Secondary | ICD-10-CM | POA: Diagnosis not present

## 2023-11-27 DIAGNOSIS — Z48812 Encounter for surgical aftercare following surgery on the circulatory system: Secondary | ICD-10-CM | POA: Diagnosis not present

## 2023-11-29 ENCOUNTER — Encounter (HOSPITAL_COMMUNITY)
Admission: RE | Admit: 2023-11-29 | Discharge: 2023-11-29 | Disposition: A | Discharge status: Home / Self Care | Source: Ambulatory Visit | Attending: Cardiovascular Disease | Admitting: Cardiovascular Disease

## 2023-11-29 ENCOUNTER — Other Ambulatory Visit: Payer: Self-pay | Admitting: Physician Assistant

## 2023-11-29 DIAGNOSIS — Z955 Presence of coronary angioplasty implant and graft: Secondary | ICD-10-CM | POA: Diagnosis not present

## 2023-11-29 DIAGNOSIS — Z48812 Encounter for surgical aftercare following surgery on the circulatory system: Secondary | ICD-10-CM | POA: Diagnosis not present

## 2023-11-29 MED ORDER — METOPROLOL TARTRATE 25 MG PO TABS: ORAL_TABLET | ORAL | 3 refills | Status: DC

## 2023-11-29 NOTE — Progress Notes (Addendum)
 Upon completion of cardiac rehab session today, pt's ending blood pressure measured 90/50 with a sinus bradycardia HR of 42.  After drinking water and upon recheck, BP raised to 98/50.  Asymptomatic.  Pt did state he mowed seven acres yesterday.  Pt attending education class at time of writing and informed to notify staff if he becomes symptomatic while in class.  Onsite NP Lucien as well as pt's regular cardiologist notified for advice.    NP notified staff to hold metoprolol  tonight, restart it tomorrow at decreased dose of 12.5mg  BID, and to inform pt to keep track of his BP and HR at home and if it stays in the 40s to call us .  Pt notified of same.

## 2023-11-29 NOTE — Progress Notes (Signed)
 Due to events at cardiac rehab we have decreased his dose of metoprolol  to tartrate to 12.5 mg twice daily.  He can skip his dose of metoprolol  tonight.  Please see earlier note.  Orren LOISE Fabry, PA-C

## 2023-12-02 ENCOUNTER — Encounter (HOSPITAL_COMMUNITY)
Admission: RE | Admit: 2023-12-02 | Discharge: 2023-12-02 | Disposition: A | Discharge status: Home / Self Care | Source: Ambulatory Visit | Attending: Cardiovascular Disease

## 2023-12-02 DIAGNOSIS — Z48812 Encounter for surgical aftercare following surgery on the circulatory system: Secondary | ICD-10-CM | POA: Diagnosis not present

## 2023-12-02 DIAGNOSIS — Z955 Presence of coronary angioplasty implant and graft: Secondary | ICD-10-CM

## 2023-12-04 ENCOUNTER — Encounter (HOSPITAL_COMMUNITY)
Admission: RE | Admit: 2023-12-04 | Discharge: 2023-12-04 | Disposition: A | Discharge status: Home / Self Care | Source: Ambulatory Visit | Attending: Cardiovascular Disease

## 2023-12-04 DIAGNOSIS — Z48812 Encounter for surgical aftercare following surgery on the circulatory system: Secondary | ICD-10-CM | POA: Diagnosis not present

## 2023-12-04 DIAGNOSIS — Z955 Presence of coronary angioplasty implant and graft: Secondary | ICD-10-CM | POA: Diagnosis not present

## 2023-12-06 ENCOUNTER — Encounter (HOSPITAL_COMMUNITY)
Admission: RE | Admit: 2023-12-06 | Discharge: 2023-12-06 | Disposition: A | Discharge status: Home / Self Care | Source: Ambulatory Visit | Attending: Cardiovascular Disease | Admitting: Cardiovascular Disease

## 2023-12-06 DIAGNOSIS — Z955 Presence of coronary angioplasty implant and graft: Secondary | ICD-10-CM | POA: Diagnosis not present

## 2023-12-09 ENCOUNTER — Encounter (HOSPITAL_COMMUNITY)
Admission: RE | Admit: 2023-12-09 | Discharge: 2023-12-09 | Disposition: A | Discharge status: Home / Self Care | Source: Ambulatory Visit | Attending: Cardiovascular Disease | Admitting: Cardiovascular Disease

## 2023-12-09 DIAGNOSIS — Z955 Presence of coronary angioplasty implant and graft: Secondary | ICD-10-CM | POA: Diagnosis not present

## 2023-12-11 ENCOUNTER — Encounter (HOSPITAL_COMMUNITY)
Admission: RE | Admit: 2023-12-11 | Discharge: 2023-12-11 | Disposition: A | Discharge status: Home / Self Care | Source: Ambulatory Visit | Attending: Cardiovascular Disease | Admitting: Cardiovascular Disease

## 2023-12-11 DIAGNOSIS — Z955 Presence of coronary angioplasty implant and graft: Secondary | ICD-10-CM | POA: Diagnosis not present

## 2023-12-13 ENCOUNTER — Encounter (HOSPITAL_COMMUNITY)
Admission: RE | Admit: 2023-12-13 | Discharge: 2023-12-13 | Disposition: A | Discharge status: Home / Self Care | Source: Ambulatory Visit | Attending: Cardiovascular Disease | Admitting: Cardiovascular Disease

## 2023-12-13 DIAGNOSIS — Z955 Presence of coronary angioplasty implant and graft: Secondary | ICD-10-CM | POA: Diagnosis not present

## 2023-12-13 NOTE — Progress Notes (Signed)
 Cardiac Individual Treatment Plan  Patient Details  Name: ROZELL THEILER MRN: [id number] Date of Birth: Sep 06, 1945 Referring Provider:   Flowsheet Row INTENSIVE CARDIAC REHAB ORIENT from 10/17/2023 in [facility]  Referring Provider Dr. Ozell Fell MD    Initial Encounter Date:  Flowsheet Row INTENSIVE CARDIAC REHAB ORIENT from 10/17/2023 in [facility] for Heart, Vascular, & Lung Health  Date 10/17/23    Visit Diagnosis: 09/20/23 S/P DES CFX  Patient's Home Medications on Admission:  Current Outpatient Medications:    amLODipine  (NORVASC ) 5 MG tablet, Take 1.5 tablets (7.5 mg total) by mouth daily., Disp: 90 tablet, Rfl: 3   Ascorbic Acid  (VITAMIN C  PO), Take 1 tablet by mouth daily., Disp: , Rfl:    aspirin  81 MG tablet, Take 81 mg by mouth daily., Disp: , Rfl:    cholecalciferol  (VITAMIN D ) 1000 UNITS tablet, Take 1,000 Units by mouth daily., Disp: , Rfl:    clopidogrel  (PLAVIX ) 75 MG tablet, Take 1 tablet (75 mg total) by mouth daily., Disp: 90 tablet, Rfl: 3   Coenzyme Q10 (CO Q 10) 100 MG CAPS, Take 300 mg by mouth every evening., Disp: , Rfl:    dorzolamide (TRUSOPT) 2 % ophthalmic solution, Place 1 drop into both eyes daily., Disp: , Rfl:    ezetimibe  (ZETIA ) 10 MG tablet, Take 1 tablet (10 mg total) by mouth daily., Disp: 90 tablet, Rfl: 3   isosorbide  mononitrate (IMDUR ) 120 MG 24 hr tablet, Take 1 tablet (120 mg total) by mouth daily., Disp: 90 tablet, Rfl: 3   metoprolol  tartrate (LOPRESSOR ) 25 MG tablet, PLEASE TAKE 12.5MG  BID, Disp: 135 tablet, Rfl: 3   nitroGLYCERIN  (NITROSTAT ) 0.4 MG SL tablet, Place 1 tablet (0.4 mg total) under the tongue every 5 (five) minutes as needed for chest pain., Disp: 25 tablet, Rfl: 11   omega-3 acid ethyl esters (LOVAZA ) 1 g capsule, TAKE 1 CAPSULE BY MOUTH 2 TIMES DAILY., Disp: 180 capsule, Rfl: 2   ranolazine  (RANEXA ) 1000 MG SR tablet, Take 1 tablet  (1,000 mg total) by mouth 2 (two) times daily., Disp: 180 tablet, Rfl: 3   rosuvastatin  (CRESTOR ) 40 MG tablet, Take 1 tablet (40 mg total) by mouth daily., Disp: 90 tablet, Rfl: 3   sodium zirconium cyclosilicate  (LOKELMA ) 10 g PACK packet, Take 10 g (1 packet total) by mouth as needed per instruction by provider based on blood work.. (Patient not taking: Reported on 10/17/2023), Disp: 7 packet, Rfl: 0   valsartan  (DIOVAN ) 80 MG tablet, Take 1 tablet (80 mg total) by mouth daily., Disp: 90 tablet, Rfl: 3   vitamin B-12 (CYANOCOBALAMIN ) 100 MCG tablet, Take 100 mcg by mouth daily. 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 levels LDL IIIa+b% and HDL2b%   Hypertension    Myocardial infarct [facility])    Myocardial infarction (HCC) 1994   inferior wall MI at [facility] hospitaland CABG   Prostate cancer [facility])    Sleep apnea    uses CPAP PRN   Sleep apnea,  obstructive    using C-PAP    Tobacco Use: Social History   Tobacco Use  Smoking Status Former   Current packs/day: 0.00   Types: Cigarettes   Quit date: 05/07/1973   Years since quitting: 50.6   Passive exposure: Never  Smokeless Tobacco Never    Labs: Review Flowsheet  More data exists      Latest Ref Rng & Units 04/28/2020 11/29/2020 08/16/2022 04/02/2023 09/10/2023  Labs for ITP Cardiac and Pulmonary Rehab  Cholestrol 100 - 199 mg/dL 846  886  882  877  873   LDL (calc) 0 - 99 mg/dL 87  54  56  63  61   HDL-C >39 mg/dL 48  39  39  40  43   Trlycerides 0 - 149 mg/dL 95  892  876  895  878   Hemoglobin A1c 4.8 - 5.6 % 7.0  - - - -    Capillary Blood  Glucose: No results found for: GLUCAP   Exercise Target Goals: Exercise Program Goal: Individual exercise prescription set using results from initial 6 min walk test and THRR while considering  patient's activity barriers and safety.   Exercise Prescription Goal: Initial exercise prescription builds to 30-45 minutes a day of aerobic activity, 2-3 days per week.  Home exercise guidelines will be given to patient during program as part of exercise prescription that the participant will acknowledge.  Activity Barriers & Risk Stratification:  Activity Barriers & Cardiac Risk Stratification - 10/17/23 1521       Activity Barriers & Cardiac Risk Stratification   Activity Barriers None    Cardiac Risk Stratification High   Under 5 MET's         6 Minute Walk:  6 Minute Walk     Row Name 10/17/23 1514         6 Minute Walk   Phase Initial     Distance 1575 feet     Walk Time 6 minutes     # of Rest Breaks 0     MPH 2.98     METS 2.67     RPE 9     Perceived Dyspnea  0     VO2 Peak 9.35     Symptoms Yes (comment)     Comments PAC's, asymptomatic     Resting HR 60 bpm     Resting BP 118/74     Resting Oxygen Saturation  98 %     Exercise Oxygen Saturation  during 6 min walk 99 %     Max Ex. HR 71 bpm     Max Ex. BP 150/56     2 Minute Post BP 128/60        Oxygen Initial Assessment:   Oxygen Re-Evaluation:   Oxygen Discharge (Final Oxygen Re-Evaluation):   Initial Exercise Prescription:  Initial Exercise Prescription - 10/17/23 1500       Date of Initial Exercise RX and Referring Provider   Date 10/17/23    Referring Provider Dr. Ozell Fell MD    Expected Discharge Date 01/08/24      Bike   Level 1    Watts 40    Minutes 15    METs 2.5      NuStep   Level 1    SPM 75    Minutes 15    METs 1.8      Prescription Details   Frequency (times per week) 3    Duration Progress to 30  minutes of continuous aerobic without signs/symptoms of  physical distress      Intensity   THRR 40-80% of Max Heartrate 57-114    Ratings of Perceived Exertion 11-13    Perceived Dyspnea 0-4      Progression   Progression Continue progressive overload as per policy without signs/symptoms or physical distress.      Resistance Training   Training Prescription Yes    Weight 4    Reps 10-15          Perform Capillary Blood Glucose checks as needed.  Exercise Prescription Changes:   Exercise Prescription Changes     Row Name 11/15/23 [phone] 11/29/23 0836 12/13/23 0821         Response to Exercise   Blood Pressure (Admit) 146/70 118/70 120/74     Blood Pressure (Exercise) 132/67 -- --     Blood Pressure (Exit) 108/54 98/50 102/54     Heart Rate (Admit) 63 bpm 64 bpm 62 bpm     Heart Rate (Exercise) 90 bpm 92 bpm 113 bpm     Heart Rate (Exit) 65 bpm 47 bpm 71 bpm     Rating of Perceived Exertion (Exercise) 11 11 11      Perceived Dyspnea (Exercise) 0 0 0     Symptoms 0 0 0     Comments Reviewed MET's, goals and home ExRx Reviewed MET's Reviewed MET's, goals and home ExRx     Duration Progress to 30 minutes of  aerobic without signs/symptoms of physical distress Progress to 30 minutes of  aerobic without signs/symptoms of physical distress Progress to 30 minutes of  aerobic without signs/symptoms of physical distress     Intensity THRR unchanged THRR unchanged THRR unchanged       Progression   Progression Continue to progress workloads to maintain intensity without signs/symptoms of physical distress. Continue to progress workloads to maintain intensity without signs/symptoms of physical distress. Continue to progress workloads to maintain intensity without signs/symptoms of physical distress.     Average METs 3.15 3.5 3.75       Resistance Training   Training Prescription Yes Yes Yes     Weight 4 4 5      Reps 10-15 10-15 10-15     Time 10 Minutes 10 Minutes 10 Minutes       Bike   Level 2 2.5 2.5     Watts 35 47 54      Minutes 76 80 86     METs 3.2 3.4 3.7       NuStep   Level 2 2 2      SPM 132 125 127     Minutes 15 15 15      METs 3.1 3.6 3.8       Home Exercise Plan   Plans to continue exercise at Home (comment) Home (comment) Home (comment)     Frequency Add 2 additional days to program exercise sessions. Add 2 additional days to program exercise sessions. Add 2 additional days to program exercise sessions.     Initial Home Exercises Provided 11/15/23 11/15/23 11/15/23        Exercise Comments:   Exercise Comments     Row Name 10/21/23 0939 11/15/23 0831 11/29/23 0837 12/13/23 0826     Exercise Comments Pt first day in the [facility] program. Pt tolerated exercise well with an average MET level of 2.5. He is off to a good start and is learning his THRR, RPE and ExRx Reviewed MET's, goals  and home ExRx. Pt tolerated exercise well with an average MET level of 3.15. He is doing well and progressing workloads and MET's. He feels good about his goals and is learning more about mindful wellness and is increasing endurance. He will add in exercise 1-2 days for 30-45 mins by walking, treadmill and stationary bike. Reviewed MET's. Pt tolerated exercise well with an average MET level of 3.4. He is doing well and progressing workloads and MET's. Working on gradual progression. Reviewed MET's, goals and home ExRx. Pt tolerated exercise well with an average MET level of 3.15. He will add in exercise 2 days for 30-45 mins by walking and going to [employer]. He is doing well with his goals and is having sucessful weight loss. He is feeling an increase in energy too.       Exercise Goals and Review:   Exercise Goals     Row Name 10/17/23 0946             Exercise Goals   Increase Physical Activity Yes       Intervention Provide advice, education, support and counseling about physical activity/exercise needs.;Develop an individualized exercise prescription for aerobic and resistive training based on  initial evaluation findings, risk stratification, comorbidities and participant's personal goals.       Expected Outcomes Short Term: Attend rehab on a regular basis to increase amount of physical activity.;Long Term: Exercising regularly at least 3-5 days a week.;Long Term: Add in home exercise to make exercise part of routine and to increase amount of physical activity.       Increase Strength and Stamina Yes       Intervention Provide advice, education, support and counseling about physical activity/exercise needs.;Develop an individualized exercise prescription for aerobic and resistive training based on initial evaluation findings, risk stratification, comorbidities and participant's personal goals.       Expected Outcomes Short Term: Increase workloads from initial exercise prescription for resistance, speed, and METs.;Short Term: Perform resistance training exercises routinely during rehab and add in resistance training at home;Long Term: Improve cardiorespiratory fitness, muscular endurance and strength as measured by increased METs and functional capacity ( )       Able to understand and use rate of perceived exertion (RPE) scale Yes       Intervention Provide education and explanation on how to use RPE scale       Expected Outcomes Short Term: Able to use RPE daily in rehab to express subjective intensity level;Long Term:  Able to use RPE to guide intensity level when exercising independently       Knowledge and understanding of Target Heart Rate Range (THRR) Yes       Intervention Provide education and explanation of THRR including how the numbers were predicted and where they are located for reference       Expected Outcomes Short Term: Able to state/look up THRR;Short Term: Able to use daily as guideline for intensity in rehab;Long Term: Able to use THRR to govern intensity when exercising independently       Understanding of Exercise Prescription Yes       Intervention Provide education,  explanation, and written materials on patient's individual exercise prescription       Expected Outcomes Short Term: Able to explain program exercise prescription;Long Term: Able to explain home exercise prescription to exercise independently          Exercise Goals Re-Evaluation :  Exercise Goals Re-Evaluation     Row Name 10/21/23 [id number] 11/15/23  9174 12/13/23 0823         Exercise Goal Re-Evaluation   Exercise Goals Review Increase Physical Activity;Understanding of Exercise Prescription;Increase Strength and Stamina;Knowledge and understanding of Target Heart Rate Range (THRR);Able to understand and use rate of perceived exertion (RPE) scale Increase Physical Activity;Understanding of Exercise Prescription;Increase Strength and Stamina;Knowledge and understanding of Target Heart Rate Range (THRR);Able to understand and use rate of perceived exertion (RPE) scale Increase Physical Activity;Understanding of Exercise Prescription;Increase Strength and Stamina;Knowledge and understanding of Target Heart Rate Range (THRR);Able to understand and use rate of perceived exertion (RPE) scale     Comments Pt first day in the [facility] program. Pt tolerated exercise well with an average MET level of 2.5. He is off to a good start and is learning his THRR, RPE and ExRx Reviewed MET's, goals and home ExRx. Pt tolerated exercise well with an average MET level of 3.15. He is doing well and progressing workloads and MET's. He feels good about his goals and is learning more about mindful wellness and is increasing endurance. He will add in exercise 1-2 days for 30-45 mins by walking, treadmill and stationary bike. Reviewed MET's, goals and home ExRx. Pt tolerated exercise well with an average MET level of 3.15. He will add in exercise 2 days for 30-45 mins by walking and going to [employer]. He is doing well with his goals and is having sucessful weight loss. He is feeling an increase in energy too.      Expected Outcomes Will continue to monitor pt and progress workloads as tolerated without sign or symptom Will continue to monitor pt and progress workloads as tolerated without sign or symptom Will continue to monitor pt and progress workloads as tolerated without sign or symptom        Discharge Exercise Prescription (Final Exercise Prescription Changes):  Exercise Prescription Changes - 12/13/23 0821       Response to Exercise   Blood Pressure (Admit) 120/74    Blood Pressure (Exit) 102/54    Heart Rate (Admit) 62 bpm    Heart Rate (Exercise) 113 bpm    Heart Rate (Exit) 71 bpm    Rating of Perceived Exertion (Exercise) 11    Perceived Dyspnea (Exercise) 0    Symptoms 0    Comments Reviewed MET's, goals and home ExRx    Duration Progress to 30 minutes of  aerobic without signs/symptoms of physical distress    Intensity THRR unchanged      Progression   Progression Continue to progress workloads to maintain intensity without signs/symptoms of physical distress.    Average METs 3.75      Resistance Training   Training Prescription Yes    Weight 5    Reps 10-15    Time 10 Minutes      Bike   Level 2.5    Watts 54    Minutes 86    METs 3.7      NuStep   Level 2    SPM 127    Minutes 15    METs 3.8      Home Exercise Plan   Plans to continue exercise at Home (comment)    Frequency Add 2 additional days to program exercise sessions.    Initial Home Exercises Provided 11/15/23          Nutrition:  Target Goals: Understanding of nutrition guidelines, daily intake of sodium 1500mg , cholesterol 200mg , calories 30% from fat and 7% or less from saturated fats, daily  to have 5 or more servings of fruits and vegetables.  Biometrics:  Pre Biometrics - 10/17/23 1510       Pre Biometrics   Waist Circumference 40.5 inches    Hip Circumference 40 inches    Waist to Hip Ratio 1.01 %    Triceps Skinfold 19 mm    % Body Fat 29.6 %    Grip Strength 37 kg    Flexibility  --   unable to reach   Single Leg Stand 30 seconds           Nutrition Therapy Plan and Nutrition Goals:  Nutrition Therapy & Goals - 12/16/23 0859       Nutrition Therapy   Diet Heart Healthy Diet    Drug/Food Interactions Statins/Certain Fruits      Personal Nutrition Goals   Nutrition Goal Patient to identify strategies for reducing cardiovascular risk by attending the [facility] education and nutrition series weekly.   goal in action.   Personal Goal #2 Patient to improve diet quality by using the plate method as a guide for meal planning to include lean protein/plant protein, fruits, vegetables, whole grains, nonfat dairy as part of a well-balanced diet.   goal in action.   Personal Goal #3 Patient to identify strategies for weight loss of 0.5-2.0# per week.   goal in action.   Comments Goals in action. Patient has medical history of CAD s/p CABG, HTN, hyperlipidemia, CKD3, OSA, old MI.He continues to attend the [facility] education/nutrition series regularly. LDL at goal. He is followed by Nephrology; potassium historically elevated but now in acceptable range. Patient is motivated to lose weight and have discussed strategies for weight loss including calorie density, the plate method as a guide for meal planning, label reading, etc.  He is down 3.5# since starting with our program. Patient will benefit from participation/completion of intensive cardiac rehab and adherence to nutrition, exercise, and lifestyle modification.      Intervention Plan   Intervention Prescribe, educate and counsel regarding individualized specific dietary modifications aiming towards targeted core components such as weight, hypertension, lipid management, diabetes, heart failure and other comorbidities.;Nutrition handout(s) given to patient.    Expected Outcomes Short Term Goal: Understand basic principles of dietary content, such as calories, fat, sodium, cholesterol and nutrients.;Long Term Goal: Adherence to  prescribed nutrition plan.          Nutrition Assessments:  MEDIFICTS Score Key: >=70 Need to make dietary changes  40-70 Heart Healthy Diet <= 40 Therapeutic Level Cholesterol Diet   Flowsheet Row INTENSIVE CARDIAC REHAB from 10/21/2023 in [facility] for Heart, Vascular, & Lung Health  Picture Your Plate Total Score on Admission 67   Picture Your Plate Scores: <59 Unhealthy dietary pattern with much room for improvement. 41-50 Dietary pattern unlikely to meet recommendations for good health and room for improvement. 51-60 More healthful dietary pattern, with some room for improvement.  >60 Healthy dietary pattern, although there may be some specific behaviors that could be improved.    Nutrition Goals Re-Evaluation:  Nutrition Goals Re-Evaluation     Row Name 10/21/23 1036 11/18/23 0823 12/16/23 0859         Goals   Current Weight 211 lb 3.2 oz (95.8 kg) 207 lb 3.7 oz (94 kg) 207 lb 3.7 oz (94 kg)     Comment Cr 1.94, GFR 35, LDL 61, HDL 43, Lpa 222 Cr 1.94, GFR 35, LDL 61, HDL 43, Lpa 222 no new labs; most recent  labs Cr 1.94, GFR 35, LDL 61, HDL 43, Lpa 222     Expected Outcome Patient has meidcal history of CAD s/p CABG, HTN, hyperlipidemia, CKD3, OSA, old MI. LDL at goal. He is followed by Nephrology; potassium was elevated but now in acceptable range. Patient is motivated to lose weight; will continue to discuss strategies for weight loss including calorie density, the plate method as a guide for meal planning, label reading, etc. Patient will benefit from participation in intensive cardiac rehab for nutrition, exercise, and lifestyle modification. Goals in progress. Patient has medical history of CAD s/p CABG, HTN, hyperlipidemia, CKD3, OSA, old MI.He continues to attend the [facility] education/nutrition series regularly. LDL at goal. He is followed by Nephrology; potassium historically elevated but now in acceptable range. Patient is motivated to lose  weight; will continue to discuss strategies for weight loss including calorie density, the plate method as a guide for meal planning, label reading, etc. He is down 3.5# since starting with our program. Patient will benefit from participation in intensive cardiac rehab for nutrition, exercise, and lifestyle modification. Goals in action. Patient has medical history of CAD s/p CABG, HTN, hyperlipidemia, CKD3, OSA, old MI.He continues to attend the [facility] education/nutrition series regularly. LDL at goal. He is followed by Nephrology; potassium historically elevated but now in acceptable range. Patient is motivated to lose weight and have discussed strategies for weight loss including calorie density, the plate method as a guide for meal planning, label reading, etc. He is down 3.5# since starting with our program. Patient will benefit from participation/completion of intensive cardiac rehab and adherence to nutrition, exercise, and lifestyle modification.        Nutrition Goals Re-Evaluation:  Nutrition Goals Re-Evaluation     Row Name 10/21/23 1036 11/18/23 0823 12/16/23 0859         Goals   Current Weight 211 lb 3.2 oz (95.8 kg) 207 lb 3.7 oz (94 kg) 207 lb 3.7 oz (94 kg)     Comment Cr 1.94, GFR 35, LDL 61, HDL 43, Lpa 222 Cr 1.94, GFR 35, LDL 61, HDL 43, Lpa 222 no new labs; most recent labs Cr 1.94, GFR 35, LDL 61, HDL 43, Lpa 222     Expected Outcome Patient has meidcal history of CAD s/p CABG, HTN, hyperlipidemia, CKD3, OSA, old MI. LDL at goal. He is followed by Nephrology; potassium was elevated but now in acceptable range. Patient is motivated to lose weight; will continue to discuss strategies for weight loss including calorie density, the plate method as a guide for meal planning, label reading, etc. Patient will benefit from participation in intensive cardiac rehab for nutrition, exercise, and lifestyle modification. Goals in progress. Patient has medical history of CAD s/p CABG, HTN,  hyperlipidemia, CKD3, OSA, old MI.He continues to attend the [facility] education/nutrition series regularly. LDL at goal. He is followed by Nephrology; potassium historically elevated but now in acceptable range. Patient is motivated to lose weight; will continue to discuss strategies for weight loss including calorie density, the plate method as a guide for meal planning, label reading, etc. He is down 3.5# since starting with our program. Patient will benefit from participation in intensive cardiac rehab for nutrition, exercise, and lifestyle modification. Goals in action. Patient has medical history of CAD s/p CABG, HTN, hyperlipidemia, CKD3, OSA, old MI.He continues to attend the [facility] education/nutrition series regularly. LDL at goal. He is followed by Nephrology; potassium historically elevated but now in acceptable range. Patient is motivated to lose  weight and have discussed strategies for weight loss including calorie density, the plate method as a guide for meal planning, label reading, etc. He is down 3.5# since starting with our program. Patient will benefit from participation/completion of intensive cardiac rehab and adherence to nutrition, exercise, and lifestyle modification.        Nutrition Goals Discharge (Final Nutrition Goals Re-Evaluation):  Nutrition Goals Re-Evaluation - 12/16/23 0859       Goals   Current Weight 207 lb 3.7 oz (94 kg)    Comment no new labs; most recent labs Cr 1.94, GFR 35, LDL 61, HDL 43, Lpa 222    Expected Outcome Goals in action. Patient has medical history of CAD s/p CABG, HTN, hyperlipidemia, CKD3, OSA, old MI.He continues to attend the [facility] education/nutrition series regularly. LDL at goal. He is followed by Nephrology; potassium historically elevated but now in acceptable range. Patient is motivated to lose weight and have discussed strategies for weight loss including calorie density, the plate method as a guide for meal planning, label reading, etc.  He is down 3.5# since starting with our program. Patient will benefit from participation/completion of intensive cardiac rehab and adherence to nutrition, exercise, and lifestyle modification.          Psychosocial: Target Goals: Acknowledge presence or absence of significant depression and/or stress, maximize coping skills, provide positive support system. Participant is able to verbalize types and ability to use techniques and skills needed for reducing stress and depression.  Initial Review & Psychosocial Screening:  Initial Psych Review & Screening - 10/17/23 1331       Initial Review   Current issues with Current Stress Concerns    Source of Stress Concerns Family    Comments One of Mr Brook's  brother's recently passed away dealing with his estate      Family Dynamics   Good Support System? Yes   Mr Para has his wife and children for support and extended family     Barriers   Psychosocial barriers to participate in program There are no identifiable barriers or psychosocial needs.      Screening Interventions   Interventions Encouraged to exercise          Quality of Life Scores:  Quality of Life - 10/17/23 1525       Quality of Life   Select Quality of Life      Quality of Life Scores   Health/Function Pre 27.83 %    Socioeconomic Pre 28.07 %    Psych/Spiritual Pre 29.14 %    Family Pre 30 %    GLOBAL Pre 28.47 %         Scores of 19 and below usually indicate a poorer quality of life in these areas.  A difference of  2-3 points is a clinically meaningful difference.  A difference of 2-3 points in the total score of the Quality of Life Index has been associated with significant improvement in overall quality of life, self-image, physical symptoms, and general health in studies assessing change in quality of life.  PHQ-9: Review Flowsheet  More data exists      10/17/2023 04/11/2023 04/05/2022 03/23/2021 06/14/2020  Depression screen PHQ 2/9  Decreased  Interest 0 0 0 0 0  Down, Depressed, Hopeless 0 0 0 0 0  PHQ - 2 Score 0 0 0 0 0  Altered sleeping 0 - - - -  Tired, decreased energy 1 - - - -  Change in appetite 0 - - - -  Feeling bad or failure about yourself  0 - - - -  Trouble concentrating 0 - - - -  Moving slowly or fidgety/restless 0 - - - -  Suicidal thoughts 0 - - - -  PHQ-9 Score 1 - - - -  Difficult doing work/chores Not difficult at all - - - -   Interpretation of Total Score  Total Score Depression Severity:  1-4 = Minimal depression, 5-9 = Mild depression, 10-14 = Moderate depression, 15-19 = Moderately severe depression, 20-27 = Severe depression   Psychosocial Evaluation and Intervention:   Psychosocial Re-Evaluation:  Psychosocial Re-Evaluation     Row Name 10/21/23 1654 11/12/23 1133 12/13/23 0935         Psychosocial Re-Evaluation   Current issues with Current Stress Concerns Current Stress Concerns Current Stress Concerns     Comments Arye Weyenberg started cardiac rehab on 10/21/23. Jerred Zaremba did not voice any increased concerns or stressor on his first day of exercise. Tomothy Eddins has not voiced any increased concerns or stressors during exercise at cardiac rehab. Sirus Labrie has not voiced any increased concerns or stressors during exercise at cardiac rehab.     Expected Outcomes Akul Leggette will  have controlled or decreased stressors upon completion of cardiac rehab Fay Swider will  have controlled or decreased stressors upon completion of cardiac rehab Oseph Imburgia will  have controlled or decreased stressors upon completion of cardiac rehab     Interventions Stress management education;Encouraged to attend Cardiac Rehabilitation for the exercise;Relaxation education Stress management education;Encouraged to attend Cardiac Rehabilitation for the exercise;Relaxation education Stress management education;Encouraged to attend Cardiac Rehabilitation for the exercise;Relaxation education      Continue Psychosocial Services  No Follow up required No Follow up required No Follow up required       Initial Review   Source of Stress Concerns Family Family Family     Comments Will continue to montior and offer supoprt as needed. Will continue to montior and offer supoprt as needed. Will continue to montior and offer supoprt as needed.        Psychosocial Discharge (Final Psychosocial Re-Evaluation):  Psychosocial Re-Evaluation - 12/13/23 0935       Psychosocial Re-Evaluation   Current issues with Current Stress Concerns    Comments Levern Pitter has not voiced any increased concerns or stressors during exercise at cardiac rehab.    Expected Outcomes Marquis Down will  have controlled or decreased stressors upon completion of cardiac rehab    Interventions Stress management education;Encouraged to attend Cardiac Rehabilitation for the exercise;Relaxation education    Continue Psychosocial Services  No Follow up required      Initial Review   Source of Stress Concerns Family    Comments Will continue to montior and offer supoprt as needed.          Vocational Rehabilitation: Provide vocational rehab assistance to qualifying candidates.   Vocational Rehab Evaluation & Intervention:  Vocational Rehab - 10/17/23 1336       Initial Vocational Rehab Evaluation & Intervention   Assessment shows need for Vocational Rehabilitation No   Mr Desaulniers is semi retired and does not need vocational rehab at this time         Education: Education Goals: Education classes will be provided on a weekly basis, covering required topics. Participant will state understanding/return demonstration of topics presented.    Education     Row Name 10/21/23 0800     Education   Cardiac Education Topics  Pritikin   Geographical information systems officer Psychosocial   Psychosocial Workshop Focused Goals, Sustainable Changes   Instruction Review Code 1-  Verbalizes Understanding   Class Start Time 0815   Class Stop Time 0850   Class Time Calculation (min) 35 min    Row Name 10/23/23 0900     Education   Cardiac Education Topics Pritikin   Orthoptist   Educator Dietitian   Weekly Topic Comforting Weekend Breakfasts   Instruction Review Code 1- Verbalizes Understanding   Class Start Time [phone]   Class Stop Time 0845   Class Time Calculation (min) 35 min    Row Name 10/25/23 0800     Education   Cardiac Education Topics Pritikin   Select Core Videos     Core Videos   Educator Exercise Physiologist   Select Nutrition   Nutrition Dining Out - Part 1   Instruction Review Code 1- Verbalizes Understanding   Class Start Time 0820   Class Stop Time 0855   Class Time Calculation (min) 35 min    Row Name 10/28/23 0900     Education   Cardiac Education Topics Pritikin   Select Core Videos     Core Videos   Educator Exercise Physiologist   Select Exercise Education   Exercise Education Biomechanial Limitations   Instruction Review Code 1- Verbalizes Understanding   Class Start Time 0815   Class Stop Time 0847   Class Time Calculation (min) 32 min    Row Name 10/30/23 1000     Education   Cardiac Education Topics Pritikin   Secondary school teacher School   Educator Dietitian   Weekly Topic Fast Evening Meals   Instruction Review Code 1- Verbalizes Understanding   Class Start Time 0815   Class Stop Time 0856   Class Time Calculation (min) 41 min    Row Name 11/01/23 0800     Education   Cardiac Education Topics Pritikin   Select Core Videos     Core Videos   Educator Dietitian   Select Nutrition   Nutrition Vitamins and Minerals   Instruction Review Code 1- Verbalizes Understanding   Class Start Time 0815   Class Stop Time 0905   Class Time Calculation (min) 50 min    Row Name 11/04/23 0800     Education   Cardiac Education Topics Pritikin   Electronics engineer Nutrition   Nutrition Workshop Fueling a Forensic psychologist   Instruction Review Code 1- Verbalizes Understanding   Class Start Time 0815   Class Stop Time 0850   Class Time Calculation (min) 35 min    Row Name 11/06/23 0900     Education   Cardiac Education Topics Pritikin   Customer service manager   Weekly Topic International Cuisine- Spotlight on the Blue Zones   Instruction Review Code 1- Verbalizes Understanding   Class Start Time 0815   Class Stop Time 0850   Class Time Calculation (min) 35 min    Row Name 11/11/23 0800     Education   Cardiac Education Topics Pritikin   Geographical information systems officer Psychosocial   Psychosocial Workshop Healthy Sleep for a Healthy Heart  Instruction Review Code 1- Verbalizes Understanding   Class Start Time 0815   Class Stop Time 0900   Class Time Calculation (min) 45 min    Row Name 11/13/23 0900     Education   Cardiac Education Topics Pritikin   Secondary school teacher School   Educator Dietitian   Weekly Topic Simple Sides and Sauces   Instruction Review Code 1- Verbalizes Understanding   Class Start Time 0815   Class Stop Time 0846   Class Time Calculation (min) 31 min    Row Name 11/15/23 0900     Education   Cardiac Education Topics Pritikin   Select Core Videos     Core Videos   Educator Exercise Physiologist   Select Psychosocial   Psychosocial How Our Thoughts Can Heal Our Hearts   Instruction Review Code 1- Verbalizes Understanding   Class Start Time 0815   Class Stop Time 0847   Class Time Calculation (min) 32 min    Row Name 11/18/23 0800     Education   Cardiac Education Topics Pritikin   Psychologist, forensic General Education   General Education Hypertension and Heart Disease   Instruction Review Code 1-  Verbalizes Understanding   Class Start Time 0815   Class Stop Time 0851   Class Time Calculation (min) 36 min    Row Name 11/20/23 0900     Education   Cardiac Education Topics Pritikin   Orthoptist   Educator Dietitian   Weekly Topic Powerhouse Plant-Based Proteins   Instruction Review Code 1- Verbalizes Understanding   Class Start Time 0815   Class Stop Time 0850   Class Time Calculation (min) 35 min    Row Name 11/25/23 0900     Education   Cardiac Education Topics Pritikin   Geographical information systems officer Psychosocial   Psychosocial Workshop From Head to Heart: The Power of a Healthy Outlook   Instruction Review Code 1- Verbalizes Understanding   Class Start Time 0815   Class Stop Time 0853   Class Time Calculation (min) 38 min    Row Name 11/27/23 0900     Education   Cardiac Education Topics Pritikin   Secondary school teacher School   Educator Dietitian   Weekly Topic Tasty Appetizers and Snacks   Instruction Review Code 1- Verbalizes Understanding   Class Start Time 0815   Class Stop Time 0856   Class Time Calculation (min) 41 min    Row Name 11/29/23 0800     Education   Cardiac Education Topics Pritikin   Psychologist, forensic General Education   General Education Heart Disease Risk Reduction   Instruction Review Code 1- Verbalizes Understanding   Class Start Time 0813   Class Stop Time 0848   Class Time Calculation (min) 35 min    Row Name 12/04/23 0900     Education   Cardiac Education Topics Pritikin   Orthoptist   Educator Dietitian   Weekly Topic Adding Flavor - Sodium-Free   Instruction Review Code 1- Verbalizes Understanding   Class Start Time 0815   Class Stop Time [phone]  Class Time Calculation (min) 35 min    Row Name 12/06/23 0700     Education   Cardiac  Education Topics Pritikin   Select Workshops     Workshops   Educator Exercise Physiologist   Select Exercise   Exercise Workshop Location manager and Fall Prevention   Instruction Review Code 1- Verbalizes Understanding   Class Start Time [phone]   Class Stop Time 0900   Class Time Calculation (min) 44 min    Row Name 12/09/23 0900     Education   Cardiac Education Topics Pritikin   Glass blower/designer Nutrition   Nutrition Workshop Label Reading   Instruction Review Code 1- Verbalizes Understanding   Class Start Time 0815   Class Stop Time 0910   Class Time Calculation (min) 55 min    Row Name 12/11/23 0800     Education   Cardiac Education Topics Pritikin   Select Core Videos     Core Videos   Educator Exercise Physiologist   Select Nutrition   Nutrition Other  Label reading   Instruction Review Code 1- Verbalizes Understanding   Class Start Time 0815   Class Stop Time 0847   Class Time Calculation (min) 32 min    Row Name 12/13/23 0800     Education   Cardiac Education Topics Pritikin   Orthoptist   Educator Dietitian   Weekly Topic Fast and Healthy Breakfasts   Instruction Review Code 1- Verbalizes Understanding   Class Start Time 0815   Class Stop Time 0847   Class Time Calculation (min) 32 min    Row Name 12/16/23 0800     Education   Cardiac Education Topics Pritikin   Hospital doctor Education   General Education Metabolic Syndrome and Belly Fat   Instruction Review Code 1- Verbalizes Understanding   Class Start Time [phone]   Class Stop Time 0900   Class Time Calculation (min) 43 min      Core Videos: Exercise    Move It!  Clinical staff conducted group or individual video education with verbal and written material and guidebook.  Patient learns the recommended Pritikin exercise program. Exercise  with the goal of living a long, healthy life. Some of the health benefits of exercise include controlled diabetes, healthier blood pressure levels, improved cholesterol levels, improved heart and lung capacity, improved sleep, and better body composition. Everyone should speak with their doctor before starting or changing an exercise routine.  Biomechanical Limitations Clinical staff conducted group or individual video education with verbal and written material and guidebook.  Patient learns how biomechanical limitations can impact exercise and how we can mitigate and possibly overcome limitations to have an impactful and balanced exercise routine.  Body Composition Clinical staff conducted group or individual video education with verbal and written material and guidebook.  Patient learns that body composition (ratio of muscle mass to fat mass) is a key component to assessing overall fitness, rather than body weight alone. Increased fat mass, especially visceral belly fat, can put us  at increased risk for metabolic syndrome, type 2 diabetes, heart disease, and even death. It is recommended to combine diet and exercise (cardiovascular and resistance training) to improve your body composition. Seek guidance from your physician and exercise physiologist before implementing an exercise routine.  Exercise  Action Plan Clinical staff conducted group or individual video education with verbal and written material and guidebook.  Patient learns the recommended strategies to achieve and enjoy long-term exercise adherence, including variety, self-motivation, self-efficacy, and positive decision making. Benefits of exercise include fitness, good health, weight management, more energy, better sleep, less stress, and overall well-being.  Medical   Heart Disease Risk Reduction Clinical staff conducted group or individual video education with verbal and written material and guidebook.  Patient learns our heart is  our most vital organ as it circulates oxygen, nutrients, white blood cells, and hormones throughout the entire body, and carries waste away. Data supports a plant-based eating plan like the Pritikin Program for its effectiveness in slowing progression of and reversing heart disease. The video provides a number of recommendations to address heart disease.   Metabolic Syndrome and Belly Fat  Clinical staff conducted group or individual video education with verbal and written material and guidebook.  Patient learns what metabolic syndrome is, how it leads to heart disease, and how one can reverse it and keep it from coming back. You have metabolic syndrome if you have 3 of the following 5 criteria: abdominal obesity, high blood pressure, high triglycerides, low HDL cholesterol, and high blood sugar.  Hypertension and Heart Disease Clinical staff conducted group or individual video education with verbal and written material and guidebook.  Patient learns that high blood pressure, or hypertension, is very common in the [place] . Hypertension is largely due to excessive salt intake, but other important risk factors include being overweight, physical inactivity, drinking too much alcohol, smoking, and not eating enough potassium from fruits and vegetables. High blood pressure is a leading risk factor for heart attack, stroke, congestive heart failure, dementia, kidney failure, and premature death. Long-term effects of excessive salt intake include stiffening of the arteries and thickening of heart muscle and organ damage. Recommendations include ways to reduce hypertension and the risk of heart disease.  Diseases of Our Time - Focusing on Diabetes Clinical staff conducted group or individual video education with verbal and written material and guidebook.  Patient learns why the best way to stop diseases of our time is prevention, through food and other lifestyle changes. Medicine (such as prescription  pills and surgeries) is often only a Band-Aid on the problem, not a long-term solution. Most common diseases of our time include obesity, type 2 diabetes, hypertension, heart disease, and cancer. The Pritikin Program is recommended and has been proven to help reduce, reverse, and/or prevent the damaging effects of metabolic syndrome.  Nutrition   Overview of the Pritikin Eating Plan  Clinical staff conducted group or individual video education with verbal and written material and guidebook.  Patient learns about the Pritikin Eating Plan for disease risk reduction. The Pritikin Eating Plan emphasizes a wide variety of unrefined, minimally-processed carbohydrates, like fruits, vegetables, whole grains, and legumes. Go, Caution, and Stop food choices are explained. Plant-based and lean animal proteins are emphasized. Rationale provided for low sodium intake for blood pressure control, low added sugars for blood sugar stabilization, and low added fats and oils for coronary artery disease risk reduction and weight management.  Calorie Density  Clinical staff conducted group or individual video education with verbal and written material and guidebook.  Patient learns about calorie density and how it impacts the Pritikin Eating Plan. Knowing the characteristics of the food you choose will help you decide whether those foods will lead to weight gain or weight loss, and whether you  want to consume more or less of them. Weight loss is usually a side effect of the Pritikin Eating Plan because of its focus on low calorie-dense foods.  Label Reading  Clinical staff conducted group or individual video education with verbal and written material and guidebook.  Patient learns about the Pritikin recommended label reading guidelines and corresponding recommendations regarding calorie density, added sugars, sodium content, and whole grains.  Dining Out - Part 1  Clinical staff conducted group or individual video  education with verbal and written material and guidebook.  Patient learns that restaurant meals can be sabotaging because they can be so high in calories, fat, sodium, and/or sugar. Patient learns recommended strategies on how to positively address this and avoid unhealthy pitfalls.  Facts on Fats  Clinical staff conducted group or individual video education with verbal and written material and guidebook.  Patient learns that lifestyle modifications can be just as effective, if not more so, as many medications for lowering your risk of heart disease. A Pritikin lifestyle can help to reduce your risk of inflammation and atherosclerosis (cholesterol build-up, or plaque, in the artery walls). Lifestyle interventions such as dietary choices and physical activity address the cause of atherosclerosis. A review of the types of fats and their impact on blood cholesterol levels, along with dietary recommendations to reduce fat intake is also included.  Nutrition Action Plan  Clinical staff conducted group or individual video education with verbal and written material and guidebook.  Patient learns how to incorporate Pritikin recommendations into their lifestyle. Recommendations include planning and keeping personal health goals in mind as an important part of their success.  Healthy Mind-Set    Healthy Minds, Bodies, Hearts  Clinical staff conducted group or individual video education with verbal and written material and guidebook.  Patient learns how to identify when they are stressed. Video will discuss the impact of that stress, as well as the many benefits of stress management. Patient will also be introduced to stress management techniques. The way we think, act, and feel has an impact on our hearts.  How Our Thoughts Can Heal Our Hearts  Clinical staff conducted group or individual video education with verbal and written material and guidebook.  Patient learns that negative thoughts can cause  depression and anxiety. This can result in negative lifestyle behavior and serious health problems. Cognitive behavioral therapy is an effective method to help control our thoughts in order to change and improve our emotional outlook.  Additional Videos:  Exercise    Improving Performance  Clinical staff conducted group or individual video education with verbal and written material and guidebook.  Patient learns to use a non-linear approach by alternating intensity levels and lengths of time spent exercising to help burn more calories and lose more body fat. Cardiovascular exercise helps improve heart health, metabolism, hormonal balance, blood sugar control, and recovery from fatigue. Resistance training improves strength, endurance, balance, coordination, reaction time, metabolism, and muscle mass. Flexibility exercise improves circulation, posture, and balance. Seek guidance from your physician and exercise physiologist before implementing an exercise routine and learn your capabilities and proper form for all exercise.  Introduction to Yoga  Clinical staff conducted group or individual video education with verbal and written material and guidebook.  Patient learns about yoga, a discipline of the coming together of mind, breath, and body. The benefits of yoga include improved flexibility, improved range of motion, better posture and core strength, increased lung function, weight loss, and positive self-image. Yoga's heart health  benefits include lowered blood pressure, healthier heart rate, decreased cholesterol and triglyceride levels, improved immune function, and reduced stress. Seek guidance from your physician and exercise physiologist before implementing an exercise routine and learn your capabilities and proper form for all exercise.  Medical   Aging: Enhancing Your Quality of Life  Clinical staff conducted group or individual video education with verbal and written material and guidebook.   Patient learns key strategies and recommendations to stay in good physical health and enhance quality of life, such as prevention strategies, having an advocate, securing a Health Care Proxy and Power of Attorney, and keeping a list of medications and system for tracking them. It also discusses how to avoid risk for bone loss.  Biology of Weight Control  Clinical staff conducted group or individual video education with verbal and written material and guidebook.  Patient learns that weight gain occurs because we consume more calories than we burn (eating more, moving less). Even if your body weight is normal, you may have higher ratios of fat compared to muscle mass. Too much body fat puts you at increased risk for cardiovascular disease, heart attack, stroke, type 2 diabetes, and obesity-related cancers. In addition to exercise, following the Pritikin Eating Plan can help reduce your risk.  Decoding Lab Results  Clinical staff conducted group or individual video education with verbal and written material and guidebook.  Patient learns that lab test reflects one measurement whose values change over time and are influenced by many factors, including medication, stress, sleep, exercise, food, hydration, pre-existing medical conditions, and more. It is recommended to use the knowledge from this video to become more involved with your lab results and evaluate your numbers to speak with your doctor.   Diseases of Our Time - Overview  Clinical staff conducted group or individual video education with verbal and written material and guidebook.  Patient learns that according to the CDC, 50% to 70% of chronic diseases (such as obesity, type 2 diabetes, elevated lipids, hypertension, and heart disease) are avoidable through lifestyle improvements including healthier food choices, listening to satiety cues, and increased physical activity.  Sleep Disorders Clinical staff conducted group or individual video  education with verbal and written material and guidebook.  Patient learns how good quality and duration of sleep are important to overall health and well-being. Patient also learns about sleep disorders and how they impact health along with recommendations to address them, including discussing with a physician.  Nutrition  Dining Out - Part 2 Clinical staff conducted group or individual video education with verbal and written material and guidebook.  Patient learns how to plan ahead and communicate in order to maximize their dining experience in a healthy and nutritious manner. Included are recommended food choices based on the type of restaurant the patient is visiting.   Fueling a Banker conducted group or individual video education with verbal and written material and guidebook.  There is a strong connection between our food choices and our health. Diseases like obesity and type 2 diabetes are very prevalent and are in large-part due to lifestyle choices. The Pritikin Eating Plan provides plenty of food and hunger-curbing satisfaction. It is easy to follow, affordable, and helps reduce health risks.  Menu Workshop  Clinical staff conducted group or individual video education with verbal and written material and guidebook.  Patient learns that restaurant meals can sabotage health goals because they are often packed with calories, fat, sodium, and sugar. Recommendations include strategies to  plan ahead and to communicate with the manager, chef, or server to help order a healthier meal.  Planning Your Eating Strategy  Clinical staff conducted group or individual video education with verbal and written material and guidebook.  Patient learns about the Pritikin Eating Plan and its benefit of reducing the risk of disease. The Pritikin Eating Plan does not focus on calories. Instead, it emphasizes high-quality, nutrient-rich foods. By knowing the characteristics of the foods, we  choose, we can determine their calorie density and make informed decisions.  Targeting Your Nutrition Priorities  Clinical staff conducted group or individual video education with verbal and written material and guidebook.  Patient learns that lifestyle habits have a tremendous impact on disease risk and progression. This video provides eating and physical activity recommendations based on your personal health goals, such as reducing LDL cholesterol, losing weight, preventing or controlling type 2 diabetes, and reducing high blood pressure.  Vitamins and Minerals  Clinical staff conducted group or individual video education with verbal and written material and guidebook.  Patient learns different ways to obtain key vitamins and minerals, including through a recommended healthy diet. It is important to discuss all supplements you take with your doctor.   Healthy Mind-Set    Smoking Cessation  Clinical staff conducted group or individual video education with verbal and written material and guidebook.  Patient learns that cigarette smoking and tobacco addiction pose a serious health risk which affects millions of people. Stopping smoking will significantly reduce the risk of heart disease, lung disease, and many forms of cancer. Recommended strategies for quitting are covered, including working with your doctor to develop a successful plan.  Culinary   Becoming a Set designer conducted group or individual video education with verbal and written material and guidebook.  Patient learns that cooking at home can be healthy, cost-effective, quick, and puts them in control. Keys to cooking healthy recipes will include looking at your recipe, assessing your equipment needs, planning ahead, making it simple, choosing cost-effective seasonal ingredients, and limiting the use of added fats, salts, and sugars.  Cooking - Breakfast and Snacks  Clinical staff conducted group or individual  video education with verbal and written material and guidebook.  Patient learns how important breakfast is to satiety and nutrition through the entire day. Recommendations include key foods to eat during breakfast to help stabilize blood sugar levels and to prevent overeating at meals later in the day. Planning ahead is also a key component.  Cooking - Educational psychologist conducted group or individual video education with verbal and written material and guidebook.  Patient learns eating strategies to improve overall health, including an approach to cook more at home. Recommendations include thinking of animal protein as a side on your plate rather than center stage and focusing instead on lower calorie dense options like vegetables, fruits, whole grains, and plant-based proteins, such as beans. Making sauces in large quantities to freeze for later and leaving the skin on your vegetables are also recommended to maximize your experience.  Cooking - Healthy Salads and Dressing Clinical staff conducted group or individual video education with verbal and written material and guidebook.  Patient learns that vegetables, fruits, whole grains, and legumes are the foundations of the Pritikin Eating Plan. Recommendations include how to incorporate each of these in flavorful and healthy salads, and how to create homemade salad dressings. Proper handling of ingredients is also covered. Cooking - Soups and [employer] -  Soups and Desserts Clinical staff conducted group or individual video education with verbal and written material and guidebook.  Patient learns that Pritikin soups and desserts make for easy, nutritious, and delicious snacks and meal components that are low in sodium, fat, sugar, and calorie density, while high in vitamins, minerals, and filling fiber. Recommendations include simple and healthy ideas for soups and desserts.   Overview     The Pritikin Solution Program  Overview Clinical staff conducted group or individual video education with verbal and written material and guidebook.  Patient learns that the results of the Pritikin Program have been documented in more than 100 articles published in peer-reviewed journals, and the benefits include reducing risk factors for (and, in some cases, even reversing) high cholesterol, high blood pressure, type 2 diabetes, obesity, and more! An overview of the three key pillars of the Pritikin Program will be covered: eating well, doing regular exercise, and having a healthy mind-set.  WORKSHOPS  Exercise: Exercise Basics: Building Your Action Plan Clinical staff led group instruction and group discussion with PowerPoint presentation and patient guidebook. To enhance the learning environment the use of posters, models and videos may be added. At the conclusion of this workshop, patients will comprehend the difference between physical activity and exercise, as well as the benefits of incorporating both, into their routine. Patients will understand the FITT (Frequency, Intensity, Time, and Type) principle and how to use it to build an exercise action plan. In addition, safety concerns and other considerations for exercise and cardiac rehab will be addressed by the presenter. The purpose of this lesson is to promote a comprehensive and effective weekly exercise routine in order to improve patients' overall level of fitness.   Managing Heart Disease: Your Path to a Healthier Heart Clinical staff led group instruction and group discussion with PowerPoint presentation and patient guidebook. To enhance the learning environment the use of posters, models and videos may be added.At the conclusion of this workshop, patients will understand the anatomy and physiology of the heart. Additionally, they will understand how Pritikin's three pillars impact the risk factors, the progression, and the management of heart disease.  The  purpose of this lesson is to provide a high-level overview of the heart, heart disease, and how the Pritikin lifestyle positively impacts risk factors.  Exercise Biomechanics Clinical staff led group instruction and group discussion with PowerPoint presentation and patient guidebook. To enhance the learning environment the use of posters, models and videos may be added. Patients will learn how the structural parts of their bodies function and how these functions impact their daily activities, movement, and exercise. Patients will learn how to promote a neutral spine, learn how to manage pain, and identify ways to improve their physical movement in order to promote healthy living. The purpose of this lesson is to expose patients to common physical limitations that impact physical activity. Participants will learn practical ways to adapt and manage aches and pains, and to minimize their effect on regular exercise. Patients will learn how to maintain good posture while sitting, walking, and lifting.  Balance Training and Fall Prevention  Clinical staff led group instruction and group discussion with PowerPoint presentation and patient guidebook. To enhance the learning environment the use of posters, models and videos may be added. At the conclusion of this workshop, patients will understand the importance of their sensorimotor skills (vision, proprioception, and the vestibular system) in maintaining their ability to balance as they age. Patients will apply a variety of balancing  exercises that are appropriate for their current level of function. Patients will understand the common causes for poor balance, possible solutions to these problems, and ways to modify their physical environment in order to minimize their fall risk. The purpose of this lesson is to teach patients about the importance of maintaining balance as they age and ways to minimize their risk of falling.  WORKSHOPS   Nutrition:   Fueling a Ship broker led group instruction and group discussion with PowerPoint presentation and patient guidebook. To enhance the learning environment the use of posters, models and videos may be added. Patients will review the foundational principles of the Pritikin Eating Plan and understand what constitutes a serving size in each of the food groups. Patients will also learn Pritikin-friendly foods that are better choices when away from home and review make-ahead meal and snack options. Calorie density will be reviewed and applied to three nutrition priorities: weight maintenance, weight loss, and weight gain. The purpose of this lesson is to reinforce (in a group setting) the key concepts around what patients are recommended to eat and how to apply these guidelines when away from home by planning and selecting Pritikin-friendly options. Patients will understand how calorie density may be adjusted for different weight management goals.  Mindful Eating  Clinical staff led group instruction and group discussion with PowerPoint presentation and patient guidebook. To enhance the learning environment the use of posters, models and videos may be added. Patients will briefly review the concepts of the Pritikin Eating Plan and the importance of low-calorie dense foods. The concept of mindful eating will be introduced as well as the importance of paying attention to internal hunger signals. Triggers for non-hunger eating and techniques for dealing with triggers will be explored. The purpose of this lesson is to provide patients with the opportunity to review the basic principles of the Pritikin Eating Plan, discuss the value of eating mindfully and how to measure internal cues of hunger and fullness using the Hunger Scale. Patients will also discuss reasons for non-hunger eating and learn strategies to use for controlling emotional eating.  Targeting Your Nutrition Priorities Clinical staff led  group instruction and group discussion with PowerPoint presentation and patient guidebook. To enhance the learning environment the use of posters, models and videos may be added. Patients will learn how to determine their genetic susceptibility to disease by reviewing their family history. Patients will gain insight into the importance of diet as part of an overall healthy lifestyle in mitigating the impact of genetics and other environmental insults. The purpose of this lesson is to provide patients with the opportunity to assess their personal nutrition priorities by looking at their family history, their own health history and current risk factors. Patients will also be able to discuss ways of prioritizing and modifying the Pritikin Eating Plan for their highest risk areas  Menu  Clinical staff led group instruction and group discussion with PowerPoint presentation and patient guidebook. To enhance the learning environment the use of posters, models and videos may be added. Using menus brought in from [employer], or printed from [employer], patients will apply the Pritikin dining out guidelines that were presented in the [employer] video. Patients will also be able to practice these guidelines in a variety of provided scenarios. The purpose of this lesson is to provide patients with the opportunity to practice hands-on learning of the Pritikin Dining Out guidelines with actual menus and practice scenarios.  Label Reading  Clinical staff led group instruction and group discussion with PowerPoint presentation and patient guidebook. To enhance the learning environment the use of posters, models and videos may be added. Patients will review and discuss the Pritikin label reading guidelines presented in Pritikin's Label Reading Educational series video. Using fool labels brought in from local grocery stores and markets, patients will apply the label reading guidelines and determine  if the packaged food meet the Pritikin guidelines. The purpose of this lesson is to provide patients with the opportunity to review, discuss, and practice hands-on learning of the Pritikin Label Reading guidelines with actual packaged food labels. Cooking School  Pritikin's [employer] are designed to teach patients ways to prepare quick, simple, and affordable recipes at home. The importance of nutrition's role in chronic disease risk reduction is reflected in its emphasis in the overall Pritikin program. By learning how to prepare essential core Pritikin Eating Plan recipes, patients will increase control over what they eat; be able to customize the flavor of foods without the use of added salt, sugar, or fat; and improve the quality of the food they consume. By learning a set of core recipes which are easily assembled, quickly prepared, and affordable, patients are more likely to prepare more healthy foods at home. These workshops focus on convenient breakfasts, simple entres, side dishes, and desserts which can be prepared with minimal effort and are consistent with nutrition recommendations for cardiovascular risk reduction. Cooking [employer] are taught by a Armed forces logistics/support/administrative officer (RD) who has been trained by the [employer]. The chef or RD has a clear understanding of the importance of minimizing - if not completely eliminating - added fat, sugar, and sodium in recipes. Throughout the series of Cooking School Workshop sessions, patients will learn about healthy ingredients and efficient methods of cooking to build confidence in their capability to prepare    Cooking School weekly topics:  Adding Flavor- Sodium-Free  Fast and Healthy Breakfasts  Powerhouse Plant-Based Proteins  Satisfying Salads and Dressings  Simple Sides and Sauces  International Cuisine-Spotlight on the [employer] Zones  Delicious Desserts  Savory Soups  [employer] - Meals in a  Astronomer Appetizers and Snacks  Comforting Weekend Breakfasts  One-Pot Wonders   Fast Evening Meals  Landscape architect Your Pritikin Plate  WORKSHOPS   Healthy Mindset (Psychosocial):  Focused Goals, Sustainable Changes Clinical staff led group instruction and group discussion with PowerPoint presentation and patient guidebook. To enhance the learning environment the use of posters, models and videos may be added. Patients will be able to apply effective goal setting strategies to establish at least one personal goal, and then take consistent, meaningful action toward that goal. They will learn to identify common barriers to achieving personal goals and develop strategies to overcome them. Patients will also gain an understanding of how our mind-set can impact our ability to achieve goals and the importance of cultivating a positive and growth-oriented mind-set. The purpose of this lesson is to provide patients with a deeper understanding of how to set and achieve personal goals, as well as the tools and strategies needed to overcome common obstacles which may arise along the way.  From Head to Heart: The Power of a Healthy Outlook  Clinical staff led group instruction and group discussion with PowerPoint presentation and patient guidebook. To enhance the learning environment the use of posters, models and videos may be added. Patients will be able to recognize and describe the  impact of emotions and mood on physical health. They will discover the importance of self-care and explore self-care practices which may work for them. Patients will also learn how to utilize the 4 C's to cultivate a healthier outlook and better manage stress and challenges. The purpose of this lesson is to demonstrate to patients how a healthy outlook is an essential part of maintaining good health, especially as they continue their cardiac rehab journey.  Healthy Sleep for a Healthy Heart Clinical staff  led group instruction and group discussion with PowerPoint presentation and patient guidebook. To enhance the learning environment the use of posters, models and videos may be added. At the conclusion of this workshop, patients will be able to demonstrate knowledge of the importance of sleep to overall health, well-being, and quality of life. They will understand the symptoms of, and treatments for, common sleep disorders. Patients will also be able to identify daytime and nighttime behaviors which impact sleep, and they will be able to apply these tools to help manage sleep-related challenges. The purpose of this lesson is to provide patients with a general overview of sleep and outline the importance of quality sleep. Patients will learn about a few of the most common sleep disorders. Patients will also be introduced to the concept of "sleep hygiene," and discover ways to self-manage certain sleeping problems through simple daily behavior changes. Finally, the workshop will motivate patients by clarifying the links between quality sleep and their goals of heart-healthy living.   Recognizing and Reducing Stress Clinical staff led group instruction and group discussion with PowerPoint presentation and patient guidebook. To enhance the learning environment the use of posters, models and videos may be added. At the conclusion of this workshop, patients will be able to understand the types of stress reactions, differentiate between acute and chronic stress, and recognize the impact that chronic stress has on their health. They will also be able to apply different coping mechanisms, such as reframing negative self-talk. Patients will have the opportunity to practice a variety of stress management techniques, such as deep abdominal breathing, progressive muscle relaxation, and/or guided imagery.  The purpose of this lesson is to educate patients on the role of stress in their lives and to provide healthy techniques  for coping with it.  Learning Barriers/Preferences:  Learning Barriers/Preferences - 10/17/23 1335       Learning Barriers/Preferences   Learning Barriers Sight   wears glasses   Learning Preferences Pictoral;Video          Education Topics:  Knowledge Questionnaire Score:  Knowledge Questionnaire Score - 10/17/23 1526       Knowledge Questionnaire Score   Pre Score 23/24          Core Components/Risk Factors/Patient Goals at Admission:  Personal Goals and Risk Factors at Admission - 10/17/23 1528       Core Components/Risk Factors/Patient Goals on Admission    Weight Management Yes;Weight Loss    Intervention Weight Management: Develop a combined nutrition and exercise program designed to reach desired caloric intake, while maintaining appropriate intake of nutrient and fiber, sodium and fats, and appropriate energy expenditure required for the weight goal.;Weight Management: Provide education and appropriate resources to help participant work on and attain dietary goals.    Admit Weight 210 lb 12.2 oz (95.6 kg)    Expected Outcomes Short Term: Continue to assess and modify interventions until short term weight is achieved;Long Term: Adherence to nutrition and physical activity/exercise program aimed toward attainment of established  weight goal;Weight Loss: Understanding of general recommendations for a balanced deficit meal plan, which promotes 1-2 lb weight loss per week and includes a negative energy balance of [id number] kcal/d;Understanding recommendations for meals to include 15-35% energy as protein, 25-35% energy from fat, 35-60% energy from carbohydrates, less than 200mg  of dietary cholesterol, 20-35 gm of total fiber daily;Understanding of distribution of calorie intake throughout the day with the consumption of 4-5 meals/snacks    Hypertension Yes    Intervention Provide education on lifestyle modifcations including regular physical activity/exercise, weight management,  moderate sodium restriction and increased consumption of fresh fruit, vegetables, and low fat dairy, alcohol moderation, and smoking cessation.;Monitor prescription use compliance.    Expected Outcomes Short Term: Continued assessment and intervention until BP is < 140/27mm HG in hypertensive participants. < 130/56mm HG in hypertensive participants with diabetes, heart failure or chronic kidney disease.;Long Term: Maintenance of blood pressure at goal levels.    Lipids Yes    Intervention Provide education and support for participant on nutrition & aerobic/resistive exercise along with prescribed medications to achieve LDL 70mg , HDL >40mg .    Expected Outcomes Short Term: Participant states understanding of desired cholesterol values and is compliant with medications prescribed. Participant is following exercise prescription and nutrition guidelines.;Long Term: Cholesterol controlled with medications as prescribed, with individualized exercise RX and with personalized nutrition plan. Value goals: LDL < 70mg , HDL > 40 mg.    Stress Yes    Intervention Offer individual and/or small group education and counseling on adjustment to heart disease, stress management and health-related lifestyle change. Teach and support self-help strategies.;Refer participants experiencing significant psychosocial distress to appropriate mental health specialists for further evaluation and treatment. When possible, include family members and significant others in education/counseling sessions.    Expected Outcomes Short Term: Participant demonstrates changes in health-related behavior, relaxation and other stress management skills, ability to obtain effective social support, and compliance with psychotropic medications if prescribed.;Long Term: Emotional wellbeing is indicated by absence of clinically significant psychosocial distress or social isolation.    Personal Goal Other Yes    Personal Goal Have the mind relax more, wants  waist size at 36 inches, endurance    Intervention Will continue to monitor pt and progress workloads as tolerated without sign or symptom    Expected Outcomes Pt will achieve his goals          Core Components/Risk Factors/Patient Goals Review:   Goals and Risk Factor Review     Row Name 10/21/23 1657 11/12/23 1135 12/13/23 0935         Core Components/Risk Factors/Patient Goals Review   Personal Goals Review Weight Management/Obesity;Hypertension;Lipids;Stress Weight Management/Obesity;Hypertension;Lipids;Stress Weight Management/Obesity;Hypertension;Lipids;Stress     Review Leverne Amrhein started cardiac rehab on 10/21/23. Juliene Sprang did well with exercise. Vital signs were stable Edwar Coe is doing well with exercise at cardiac rehab. Vital signs have been stable. Demontae Antunes has increased his met levels. Michaeljames Milnes is doing well with exercise at cardiac rehab. Vital signs have been stable. Dewaine Morocho has increased his met levels.     Expected Outcomes Javaughn Opdahl will continue to particpate in cardiac rehab for exercise, nutrition and lifestyle modifications Paige Vanderwoude will continue to particpate in cardiac rehab for exercise, nutrition and lifestyle modifications Sargent Mankey will continue to particpate in cardiac rehab for exercise, nutrition and lifestyle modifications        Core Components/Risk Factors/Patient Goals at Discharge (Final Review):   Goals and Risk Factor Review - 12/13/23 9064  Core Components/Risk Factors/Patient Goals Review   Personal Goals Review Weight Management/Obesity;Hypertension;Lipids;Stress    Review Avonte Sensabaugh is doing well with exercise at cardiac rehab. Vital signs have been stable. Lord Lancour has increased his met levels.    Expected Outcomes Joshue Badal will continue to particpate in cardiac rehab for exercise, nutrition and lifestyle modifications          ITP Comments:  ITP Comments     Row Name 10/17/23  0945 10/21/23 1652 11/12/23 1132 12/13/23 0934     ITP Comments Wilbert Bihari, MD: Medical Director.  Intorduction to the [facility]/ Intensive [facility] Rehab.  Initial orientation packet reviewed with the patient. 30 Day ITP Review. Armando Bukhari started cardiac rehab on 10/21/23 30 Day ITP Review. Paiton Fosco has good attendance and participation with exercise at  cardiac rehab 30 Day ITP Review. Danial Hlavac has good attendance and participation with exercise at cardiac rehab       Comments: see ITP comments

## 2023-12-16 ENCOUNTER — Encounter (HOSPITAL_COMMUNITY)
Admission: RE | Admit: 2023-12-16 | Discharge: 2023-12-16 | Disposition: A | Discharge status: Home / Self Care | Source: Ambulatory Visit | Attending: Cardiovascular Disease

## 2023-12-16 DIAGNOSIS — Z955 Presence of coronary angioplasty implant and graft: Secondary | ICD-10-CM | POA: Diagnosis not present

## 2023-12-17 ENCOUNTER — Other Ambulatory Visit (HOSPITAL_COMMUNITY): Payer: Self-pay

## 2023-12-18 ENCOUNTER — Encounter (HOSPITAL_COMMUNITY)
Admission: RE | Admit: 2023-12-18 | Discharge: 2023-12-18 | Disposition: A | Discharge status: Home / Self Care | Source: Ambulatory Visit | Attending: Cardiovascular Disease | Admitting: Cardiovascular Disease

## 2023-12-18 DIAGNOSIS — Z955 Presence of coronary angioplasty implant and graft: Secondary | ICD-10-CM | POA: Diagnosis not present

## 2023-12-20 ENCOUNTER — Encounter (HOSPITAL_COMMUNITY)
Admission: RE | Admit: 2023-12-20 | Discharge: 2023-12-20 | Disposition: A | Discharge status: Home / Self Care | Source: Ambulatory Visit | Attending: Cardiovascular Disease | Admitting: Cardiovascular Disease

## 2023-12-20 DIAGNOSIS — Z955 Presence of coronary angioplasty implant and graft: Secondary | ICD-10-CM | POA: Diagnosis not present

## 2023-12-23 ENCOUNTER — Encounter (HOSPITAL_COMMUNITY)
Admission: RE | Admit: 2023-12-23 | Discharge: 2023-12-23 | Disposition: A | Discharge status: Home / Self Care | Source: Ambulatory Visit | Attending: Cardiovascular Disease | Admitting: Cardiovascular Disease

## 2023-12-23 DIAGNOSIS — Z955 Presence of coronary angioplasty implant and graft: Secondary | ICD-10-CM

## 2023-12-25 ENCOUNTER — Encounter (HOSPITAL_COMMUNITY)
Admission: RE | Admit: 2023-12-25 | Discharge: 2023-12-25 | Disposition: A | Discharge status: Home / Self Care | Source: Ambulatory Visit | Attending: Cardiovascular Disease

## 2023-12-25 DIAGNOSIS — Z955 Presence of coronary angioplasty implant and graft: Secondary | ICD-10-CM

## 2023-12-27 ENCOUNTER — Encounter (HOSPITAL_COMMUNITY)
Admission: RE | Admit: 2023-12-27 | Discharge: 2023-12-27 | Disposition: A | Discharge status: Home / Self Care | Source: Ambulatory Visit | Attending: Cardiovascular Disease | Admitting: Cardiovascular Disease

## 2023-12-27 DIAGNOSIS — Z955 Presence of coronary angioplasty implant and graft: Secondary | ICD-10-CM

## 2023-12-30 ENCOUNTER — Encounter (HOSPITAL_COMMUNITY)
Admission: RE | Admit: 2023-12-30 | Discharge: 2023-12-30 | Disposition: A | Discharge status: Home / Self Care | Source: Ambulatory Visit | Attending: Cardiovascular Disease | Admitting: Cardiovascular Disease

## 2023-12-30 DIAGNOSIS — Z955 Presence of coronary angioplasty implant and graft: Secondary | ICD-10-CM

## 2024-01-01 ENCOUNTER — Encounter (HOSPITAL_COMMUNITY)
Admission: RE | Admit: 2024-01-01 | Discharge: 2024-01-01 | Disposition: A | Discharge status: Home / Self Care | Source: Ambulatory Visit | Attending: Cardiovascular Disease

## 2024-01-01 DIAGNOSIS — Z955 Presence of coronary angioplasty implant and graft: Secondary | ICD-10-CM | POA: Diagnosis not present

## 2024-01-03 ENCOUNTER — Encounter (HOSPITAL_COMMUNITY)
Admission: RE | Admit: 2024-01-03 | Discharge: 2024-01-03 | Disposition: A | Discharge status: Home / Self Care | Source: Ambulatory Visit | Attending: Cardiovascular Disease | Admitting: Cardiovascular Disease

## 2024-01-03 DIAGNOSIS — U071 COVID-19: Secondary | ICD-10-CM | POA: Diagnosis not present

## 2024-01-03 DIAGNOSIS — Z8546 Personal history of malignant neoplasm of prostate: Secondary | ICD-10-CM | POA: Diagnosis not present

## 2024-01-03 DIAGNOSIS — Z7982 Long term (current) use of aspirin: Secondary | ICD-10-CM | POA: Diagnosis not present

## 2024-01-03 DIAGNOSIS — E785 Hyperlipidemia, unspecified: Secondary | ICD-10-CM | POA: Diagnosis present

## 2024-01-03 DIAGNOSIS — J189 Pneumonia, unspecified organism: Secondary | ICD-10-CM | POA: Diagnosis present

## 2024-01-03 DIAGNOSIS — Z951 Presence of aortocoronary bypass graft: Secondary | ICD-10-CM | POA: Diagnosis not present

## 2024-01-03 DIAGNOSIS — Z955 Presence of coronary angioplasty implant and graft: Secondary | ICD-10-CM | POA: Diagnosis not present

## 2024-01-03 DIAGNOSIS — I252 Old myocardial infarction: Secondary | ICD-10-CM | POA: Diagnosis not present

## 2024-01-03 DIAGNOSIS — J1282 Pneumonia due to coronavirus disease 2019: Secondary | ICD-10-CM | POA: Diagnosis not present

## 2024-01-03 DIAGNOSIS — R7989 Other specified abnormal findings of blood chemistry: Secondary | ICD-10-CM | POA: Diagnosis not present

## 2024-01-03 DIAGNOSIS — I2489 Other forms of acute ischemic heart disease: Secondary | ICD-10-CM | POA: Diagnosis present

## 2024-01-03 DIAGNOSIS — R509 Fever, unspecified: Secondary | ICD-10-CM | POA: Diagnosis not present

## 2024-01-03 DIAGNOSIS — Z808 Family history of malignant neoplasm of other organs or systems: Secondary | ICD-10-CM | POA: Diagnosis not present

## 2024-01-03 DIAGNOSIS — I2510 Atherosclerotic heart disease of native coronary artery without angina pectoris: Secondary | ICD-10-CM | POA: Diagnosis present

## 2024-01-03 DIAGNOSIS — I700 Atherosclerosis of aorta: Secondary | ICD-10-CM | POA: Diagnosis not present

## 2024-01-03 DIAGNOSIS — Z823 Family history of stroke: Secondary | ICD-10-CM | POA: Diagnosis not present

## 2024-01-03 DIAGNOSIS — N179 Acute kidney failure, unspecified: Secondary | ICD-10-CM | POA: Diagnosis present

## 2024-01-03 DIAGNOSIS — R059 Cough, unspecified: Secondary | ICD-10-CM | POA: Diagnosis not present

## 2024-01-03 DIAGNOSIS — Z8249 Family history of ischemic heart disease and other diseases of the circulatory system: Secondary | ICD-10-CM | POA: Diagnosis not present

## 2024-01-03 DIAGNOSIS — Z87891 Personal history of nicotine dependence: Secondary | ICD-10-CM | POA: Diagnosis not present

## 2024-01-03 DIAGNOSIS — Z7902 Long term (current) use of antithrombotics/antiplatelets: Secondary | ICD-10-CM | POA: Diagnosis not present

## 2024-01-03 DIAGNOSIS — I129 Hypertensive chronic kidney disease with stage 1 through stage 4 chronic kidney disease, or unspecified chronic kidney disease: Secondary | ICD-10-CM | POA: Diagnosis present

## 2024-01-03 DIAGNOSIS — Z79899 Other long term (current) drug therapy: Secondary | ICD-10-CM | POA: Diagnosis not present

## 2024-01-03 DIAGNOSIS — N1832 Chronic kidney disease, stage 3b: Secondary | ICD-10-CM | POA: Diagnosis present

## 2024-01-03 DIAGNOSIS — G4733 Obstructive sleep apnea (adult) (pediatric): Secondary | ICD-10-CM | POA: Diagnosis present

## 2024-01-03 DIAGNOSIS — R918 Other nonspecific abnormal finding of lung field: Secondary | ICD-10-CM | POA: Diagnosis not present

## 2024-01-03 DIAGNOSIS — Z800 Family history of malignant neoplasm of digestive organs: Secondary | ICD-10-CM | POA: Diagnosis not present

## 2024-01-03 DIAGNOSIS — R079 Chest pain, unspecified: Secondary | ICD-10-CM | POA: Diagnosis not present

## 2024-01-03 DIAGNOSIS — R0789 Other chest pain: Secondary | ICD-10-CM | POA: Diagnosis not present

## 2024-01-05 ENCOUNTER — Inpatient Hospital Stay (HOSPITAL_BASED_OUTPATIENT_CLINIC_OR_DEPARTMENT_OTHER)
Admission: EM | Admit: 2024-01-05 | Discharge: 2024-01-08 | DRG: 177 | Disposition: A | Discharge status: Home / Self Care | Attending: Internal Medicine | Admitting: Internal Medicine

## 2024-01-05 ENCOUNTER — Encounter (HOSPITAL_BASED_OUTPATIENT_CLINIC_OR_DEPARTMENT_OTHER): Payer: Self-pay

## 2024-01-05 ENCOUNTER — Other Ambulatory Visit: Payer: Self-pay

## 2024-01-05 ENCOUNTER — Emergency Department (HOSPITAL_BASED_OUTPATIENT_CLINIC_OR_DEPARTMENT_OTHER)

## 2024-01-05 DIAGNOSIS — Z87891 Personal history of nicotine dependence: Secondary | ICD-10-CM

## 2024-01-05 DIAGNOSIS — I2489 Other forms of acute ischemic heart disease: Secondary | ICD-10-CM | POA: Diagnosis present

## 2024-01-05 DIAGNOSIS — N1832 Chronic kidney disease, stage 3b: Secondary | ICD-10-CM | POA: Diagnosis present

## 2024-01-05 DIAGNOSIS — J189 Pneumonia, unspecified organism: Secondary | ICD-10-CM | POA: Diagnosis not present

## 2024-01-05 DIAGNOSIS — N189 Chronic kidney disease, unspecified: Secondary | ICD-10-CM | POA: Diagnosis not present

## 2024-01-05 DIAGNOSIS — Z955 Presence of coronary angioplasty implant and graft: Secondary | ICD-10-CM

## 2024-01-05 DIAGNOSIS — G4733 Obstructive sleep apnea (adult) (pediatric): Secondary | ICD-10-CM | POA: Diagnosis not present

## 2024-01-05 DIAGNOSIS — I2510 Atherosclerotic heart disease of native coronary artery without angina pectoris: Secondary | ICD-10-CM | POA: Diagnosis present

## 2024-01-05 DIAGNOSIS — R509 Fever, unspecified: Secondary | ICD-10-CM | POA: Diagnosis not present

## 2024-01-05 DIAGNOSIS — N179 Acute kidney failure, unspecified: Secondary | ICD-10-CM | POA: Diagnosis present

## 2024-01-05 DIAGNOSIS — I10 Essential (primary) hypertension: Secondary | ICD-10-CM | POA: Diagnosis present

## 2024-01-05 DIAGNOSIS — Z800 Family history of malignant neoplasm of digestive organs: Secondary | ICD-10-CM

## 2024-01-05 DIAGNOSIS — U071 COVID-19: Principal | ICD-10-CM | POA: Diagnosis present

## 2024-01-05 DIAGNOSIS — R7989 Other specified abnormal findings of blood chemistry: Secondary | ICD-10-CM | POA: Diagnosis not present

## 2024-01-05 DIAGNOSIS — Z808 Family history of malignant neoplasm of other organs or systems: Secondary | ICD-10-CM

## 2024-01-05 DIAGNOSIS — Z7982 Long term (current) use of aspirin: Secondary | ICD-10-CM

## 2024-01-05 DIAGNOSIS — I252 Old myocardial infarction: Secondary | ICD-10-CM

## 2024-01-05 DIAGNOSIS — E7849 Other hyperlipidemia: Secondary | ICD-10-CM | POA: Diagnosis not present

## 2024-01-05 DIAGNOSIS — I129 Hypertensive chronic kidney disease with stage 1 through stage 4 chronic kidney disease, or unspecified chronic kidney disease: Secondary | ICD-10-CM | POA: Diagnosis present

## 2024-01-05 DIAGNOSIS — R0789 Other chest pain: Secondary | ICD-10-CM | POA: Diagnosis not present

## 2024-01-05 DIAGNOSIS — I700 Atherosclerosis of aorta: Secondary | ICD-10-CM | POA: Diagnosis not present

## 2024-01-05 DIAGNOSIS — Z79899 Other long term (current) drug therapy: Secondary | ICD-10-CM

## 2024-01-05 DIAGNOSIS — R059 Cough, unspecified: Secondary | ICD-10-CM | POA: Diagnosis not present

## 2024-01-05 DIAGNOSIS — Z8249 Family history of ischemic heart disease and other diseases of the circulatory system: Secondary | ICD-10-CM

## 2024-01-05 DIAGNOSIS — Z7902 Long term (current) use of antithrombotics/antiplatelets: Secondary | ICD-10-CM

## 2024-01-05 DIAGNOSIS — Z8679 Personal history of other diseases of the circulatory system: Secondary | ICD-10-CM | POA: Diagnosis not present

## 2024-01-05 DIAGNOSIS — Z823 Family history of stroke: Secondary | ICD-10-CM

## 2024-01-05 DIAGNOSIS — Z8546 Personal history of malignant neoplasm of prostate: Secondary | ICD-10-CM

## 2024-01-05 DIAGNOSIS — R918 Other nonspecific abnormal finding of lung field: Secondary | ICD-10-CM | POA: Diagnosis not present

## 2024-01-05 DIAGNOSIS — J1282 Pneumonia due to coronavirus disease 2019: Secondary | ICD-10-CM | POA: Diagnosis not present

## 2024-01-05 DIAGNOSIS — E785 Hyperlipidemia, unspecified: Secondary | ICD-10-CM | POA: Diagnosis present

## 2024-01-05 DIAGNOSIS — Z951 Presence of aortocoronary bypass graft: Secondary | ICD-10-CM

## 2024-01-05 LAB — BASIC METABOLIC PANEL WITH GFR
Anion gap: 11 (ref 5–15)
BUN: 21 mg/dL (ref 8–23)
CO2: 26 mmol/L (ref 22–32)
Calcium: 10.3 mg/dL (ref 8.9–10.3)
Chloride: 100 mmol/L (ref 98–111)
Creatinine, Ser: 2.26 mg/dL — ABNORMAL HIGH (ref 0.61–1.24)
GFR, Estimated: 29 mL/min — ABNORMAL LOW (ref >60)
Glucose, Bld: 109 mg/dL — ABNORMAL HIGH (ref 70–99)
Potassium: 4.6 mmol/L (ref 3.5–5.1)
Sodium: 137 mmol/L (ref 135–145)

## 2024-01-05 LAB — LIPASE, BLOOD: Lipase: 23 U/L (ref 11–51)

## 2024-01-05 LAB — RESP PANEL BY RT-PCR (RSV, FLU A&B, COVID)  RVPGX2
Influenza A by PCR: NEGATIVE
Influenza B by PCR: NEGATIVE
Resp Syncytial Virus by PCR: NEGATIVE
SARS Coronavirus 2 by RT PCR: POSITIVE — AB

## 2024-01-05 LAB — LACTIC ACID, PLASMA: Lactic Acid, Venous: 1.4 mmol/L (ref 0.5–1.9)

## 2024-01-05 LAB — CBC
HCT: 43.7 % (ref 39.0–52.0)
Hemoglobin: 14.1 g/dL (ref 13.0–17.0)
MCH: 29.6 pg (ref 26.0–34.0)
MCHC: 32.3 g/dL (ref 30.0–36.0)
MCV: 91.8 fL (ref 80.0–100.0)
Platelets: 181 K/uL (ref 150–400)
RBC: 4.76 MIL/uL (ref 4.22–5.81)
RDW: 12.5 % (ref 11.5–15.5)
WBC: 9.2 K/uL (ref 4.0–10.5)
nRBC: 0 % (ref 0.0–0.2)

## 2024-01-05 LAB — HEPATIC FUNCTION PANEL
ALT: 18 U/L (ref 0–44)
AST: 33 U/L (ref 15–41)
Albumin: 4.4 g/dL (ref 3.5–5.0)
Alkaline Phosphatase: 74 U/L (ref 38–126)
Bilirubin, Direct: 0.2 mg/dL (ref 0.0–0.2)
Indirect Bilirubin: 0.3 mg/dL (ref 0.3–0.9)
Total Bilirubin: 0.5 mg/dL (ref 0.0–1.2)
Total Protein: 7.5 g/dL (ref 6.5–8.1)

## 2024-01-05 LAB — TROPONIN T, HIGH SENSITIVITY: Troponin T High Sensitivity: 27 ng/L — ABNORMAL HIGH (ref 0–19)

## 2024-01-05 MED ORDER — LACTATED RINGERS IV SOLN: INTRAVENOUS | Status: AC

## 2024-01-05 MED ORDER — ROSUVASTATIN CALCIUM 20 MG PO TABS
40.0000 mg | ORAL_TABLET | Freq: Every day | ORAL | Status: DC
Start: 2024-01-06 — End: 2024-01-08
  Administered 2024-01-06 – 2024-01-08 (×3): 40 mg via ORAL
  Filled 2024-01-05 (×3): qty 2

## 2024-01-05 MED ORDER — SODIUM CHLORIDE 0.9% FLUSH
3.0000 mL | Freq: Two times a day (BID) | INTRAVENOUS | Status: DC
  Administered 2024-01-05: 3 mL via INTRAVENOUS

## 2024-01-05 MED ORDER — HEPARIN SODIUM (PORCINE) 5000 UNIT/ML IJ SOLN
5000.0000 [IU] | Freq: Three times a day (TID) | INTRAMUSCULAR | Status: DC
  Administered 2024-01-06 – 2024-01-08 (×7): 5000 [IU] via SUBCUTANEOUS
  Filled 2024-01-05 (×7): qty 1

## 2024-01-05 MED ORDER — AMOXICILLIN-POT CLAVULANATE 875-125 MG PO TABS: 1.0000 | ORAL_TABLET | Freq: Two times a day (BID) | ORAL | 0 refills | Status: DC

## 2024-01-05 MED ORDER — ONDANSETRON HCL 4 MG/2ML IJ SOLN
4.0000 mg | Freq: Once | INTRAMUSCULAR | Status: AC
  Administered 2024-01-05: 4 mg via INTRAVENOUS
  Filled 2024-01-05: qty 2

## 2024-01-05 MED ORDER — SODIUM CHLORIDE 0.9 % IV SOLN
2.0000 g | INTRAVENOUS | Status: DC
  Administered 2024-01-06 – 2024-01-08 (×3): 2 g via INTRAVENOUS
  Filled 2024-01-05 (×3): qty 20

## 2024-01-05 MED ORDER — NITROGLYCERIN 0.4 MG SL SUBL: 0.4000 mg | SUBLINGUAL_TABLET | SUBLINGUAL | Status: DC | PRN

## 2024-01-05 MED ORDER — SODIUM CHLORIDE 0.9 % IV SOLN
100.0000 mg | Freq: Every day | INTRAVENOUS | Status: AC
  Administered 2024-01-06 – 2024-01-07 (×2): 100 mg via INTRAVENOUS
  Filled 2024-01-05 (×2): qty 20

## 2024-01-05 MED ORDER — GUAIFENESIN ER 600 MG PO TB12
600.0000 mg | ORAL_TABLET | Freq: Two times a day (BID) | ORAL | Status: DC
  Administered 2024-01-05 – 2024-01-08 (×6): 600 mg via ORAL
  Filled 2024-01-05 (×6): qty 1

## 2024-01-05 MED ORDER — METOPROLOL TARTRATE 25 MG PO TABS
25.0000 mg | ORAL_TABLET | Freq: Two times a day (BID) | ORAL | Status: DC
  Administered 2024-01-05 – 2024-01-08 (×5): 25 mg via ORAL
  Filled 2024-01-05 (×6): qty 1

## 2024-01-05 MED ORDER — ONDANSETRON HCL 4 MG/2ML IJ SOLN: 4.0000 mg | Freq: Four times a day (QID) | INTRAMUSCULAR | Status: DC | PRN

## 2024-01-05 MED ORDER — ASPIRIN 81 MG PO CHEW
81.0000 mg | CHEWABLE_TABLET | Freq: Every day | ORAL | Status: DC
  Administered 2024-01-06 – 2024-01-08 (×3): 81 mg via ORAL
  Filled 2024-01-05 (×3): qty 1

## 2024-01-05 MED ORDER — MOLNUPIRAVIR EUA 200MG CAPSULE: 4.0000 | ORAL_CAPSULE | Freq: Two times a day (BID) | ORAL | 0 refills | Status: DC

## 2024-01-05 MED ORDER — CLOPIDOGREL BISULFATE 75 MG PO TABS
75.0000 mg | ORAL_TABLET | Freq: Every day | ORAL | Status: DC
  Administered 2024-01-06 – 2024-01-08 (×3): 75 mg via ORAL
  Filled 2024-01-05 (×3): qty 1

## 2024-01-05 MED ORDER — RANOLAZINE ER 500 MG PO TB12
1000.0000 mg | ORAL_TABLET | Freq: Two times a day (BID) | ORAL | Status: DC
  Administered 2024-01-05 – 2024-01-08 (×6): 1000 mg via ORAL
  Filled 2024-01-05 (×6): qty 2

## 2024-01-05 MED ORDER — AMLODIPINE BESYLATE 5 MG PO TABS
7.5000 mg | ORAL_TABLET | Freq: Every day | ORAL | Status: DC
  Administered 2024-01-05 – 2024-01-08 (×4): 7.5 mg via ORAL
  Filled 2024-01-05 (×4): qty 2

## 2024-01-05 MED ORDER — ONDANSETRON HCL 4 MG PO TABS: 4.0000 mg | ORAL_TABLET | Freq: Four times a day (QID) | ORAL | Status: DC | PRN

## 2024-01-05 MED ORDER — ACETAMINOPHEN 325 MG PO TABS: 650.0000 mg | ORAL_TABLET | Freq: Four times a day (QID) | ORAL | Status: DC | PRN

## 2024-01-05 MED ORDER — SODIUM CHLORIDE 0.9% FLUSH: 3.0000 mL | INTRAVENOUS | Status: DC | PRN

## 2024-01-05 MED ORDER — SODIUM CHLORIDE 0.9 % IV SOLN
100.0000 mg | INTRAVENOUS | Status: AC
  Administered 2024-01-05 (×2): 100 mg via INTRAVENOUS
  Filled 2024-01-05: qty 20

## 2024-01-05 MED ORDER — SODIUM CHLORIDE 0.9 % IV SOLN
100.0000 mg | Freq: Two times a day (BID) | INTRAVENOUS | Status: DC
  Administered 2024-01-06 – 2024-01-07 (×4): 100 mg via INTRAVENOUS
  Filled 2024-01-05 (×5): qty 100

## 2024-01-05 MED ORDER — IPRATROPIUM-ALBUTEROL 0.5-2.5 (3) MG/3ML IN SOLN: 3.0000 mL | RESPIRATORY_TRACT | Status: DC | PRN

## 2024-01-05 MED ORDER — VITAMIN C 500 MG PO TABS
250.0000 mg | ORAL_TABLET | Freq: Every day | ORAL | Status: DC
  Administered 2024-01-06 – 2024-01-08 (×3): 250 mg via ORAL
  Filled 2024-01-05 (×3): qty 1

## 2024-01-05 MED ORDER — ACETAMINOPHEN 500 MG PO TABS
1000.0000 mg | ORAL_TABLET | Freq: Once | ORAL | Status: AC
  Administered 2024-01-05: 1000 mg via ORAL
  Filled 2024-01-05: qty 2

## 2024-01-05 MED ORDER — ACETAMINOPHEN 650 MG RE SUPP: 650.0000 mg | Freq: Four times a day (QID) | RECTAL | Status: DC | PRN

## 2024-01-05 MED ORDER — AMOXICILLIN-POT CLAVULANATE 875-125 MG PO TABS
1.0000 | ORAL_TABLET | Freq: Once | ORAL | Status: AC
  Administered 2024-01-05: 1 via ORAL
  Filled 2024-01-05: qty 1

## 2024-01-05 MED ORDER — SODIUM CHLORIDE 0.9 % IV SOLN
250.0000 mL | INTRAVENOUS | Status: AC | PRN
Start: 2024-01-05 — End: 2024-01-06

## 2024-01-05 MED ORDER — VITAMIN D 25 MCG (1000 UNIT) PO TABS
1000.0000 [IU] | ORAL_TABLET | Freq: Every day | ORAL | Status: DC
  Administered 2024-01-06 – 2024-01-08 (×3): 1000 [IU] via ORAL
  Filled 2024-01-05 (×3): qty 1

## 2024-01-05 MED ORDER — ISOSORBIDE MONONITRATE ER 60 MG PO TB24
120.0000 mg | ORAL_TABLET | Freq: Every day | ORAL | Status: DC
  Administered 2024-01-06 – 2024-01-08 (×3): 120 mg via ORAL
  Filled 2024-01-05 (×3): qty 2

## 2024-01-05 MED ORDER — BENZONATATE 100 MG PO CAPS: 100.0000 mg | ORAL_CAPSULE | Freq: Three times a day (TID) | ORAL | 0 refills | Status: DC

## 2024-01-05 MED ORDER — SODIUM CHLORIDE 0.9% FLUSH
3.0000 mL | Freq: Two times a day (BID) | INTRAVENOUS | Status: DC
  Administered 2024-01-05 – 2024-01-07 (×2): 3 mL via INTRAVENOUS

## 2024-01-05 MED ORDER — SODIUM CHLORIDE 0.9 % IV BOLUS
1000.0000 mL | Freq: Once | INTRAVENOUS | Status: AC
  Administered 2024-01-05: 1000 mL via INTRAVENOUS

## 2024-01-05 MED ORDER — SODIUM CHLORIDE 0.9 % IV SOLN: 200.0000 mg | Freq: Once | INTRAVENOUS | Status: DC

## 2024-01-05 MED ORDER — AZITHROMYCIN 250 MG PO TABS
500.0000 mg | ORAL_TABLET | Freq: Once | ORAL | Status: AC
  Administered 2024-01-05: 500 mg via ORAL
  Filled 2024-01-05: qty 2

## 2024-01-05 MED ORDER — AZITHROMYCIN 250 MG PO TABS: 250.0000 mg | ORAL_TABLET | Freq: Every day | ORAL | 0 refills | Status: DC

## 2024-01-05 MED ORDER — SODIUM CHLORIDE 0.9 % IV SOLN: 100.0000 mg | Freq: Every day | INTRAVENOUS | Status: DC

## 2024-01-05 MED ORDER — ONDANSETRON 4 MG PO TBDP
ORAL_TABLET | ORAL | 0 refills | Status: DC
Start: 2024-01-05 — End: 2024-02-11

## 2024-01-05 NOTE — Discharge Instructions (Signed)
 Take tylenol 2 pills 4 times a day and motrin 4 pills 3 times a day.  Drink plenty of fluids.  Return for worsening shortness of breath, headache, confusion. Follow up with your family doctor.

## 2024-01-05 NOTE — ED Provider Notes (Addendum)
 [place] [facility] AT [facility] Provider Note   CSN: [id number] Arrival date & time: 01/05/24  8196     Patient presents with: Chest Pain, Cough, Fever, and Shortness of Breath   Joshua Hahn is a 78 y.o. male.   78 yo M with a chief complaints of cough congestion fever going on since yesterday.  He recently went to a family reunion but denies any sick contacts but then later stated that his brother did not feel well.  No nausea or vomiting.  Feels a little bit constipated.  No abdominal pain.  Has some chest pain especially with coughing.   Chest Pain Associated symptoms: cough, fever and shortness of breath   Cough Associated symptoms: chest pain, fever and shortness of breath   Fever Associated symptoms: chest pain and cough   Shortness of Breath Associated symptoms: chest pain, cough and fever        Prior to Admission medications   Medication Sig Start Date End Date Taking? Authorizing Provider  amoxicillin -clavulanate (AUGMENTIN ) 875-125 MG tablet Take 1 tablet by mouth every 12 (twelve) hours. 01/05/24  Yes Emil Share, DO  azithromycin  (ZITHROMAX ) 250 MG tablet Take 1 tablet (250 mg total) by mouth daily. 01/05/24  Yes Emil Share, DO  benzonatate  (TESSALON ) 100 MG capsule Take 1 capsule (100 mg total) by mouth every 8 (eight) hours. 01/05/24  Yes Emil Share, DO  molnupiravir  EUA (LAGEVRIO ) 200 mg CAPS capsule Take 4 capsules (800 mg total) by mouth 2 (two) times daily for 5 days. 01/05/24 01/10/24 Yes Emil Share, DO  ondansetron  (ZOFRAN -ODT) 4 MG disintegrating tablet 4mg  ODT q4 hours prn nausea/vomit 01/05/24  Yes Ashly Goethe, DO  amLODipine  (NORVASC ) 5 MG tablet Take 1.5 tablets (7.5 mg total) by mouth daily. 11/14/23   Meng, Hao, PA  Ascorbic Acid  (VITAMIN C  PO) Take 1 tablet by mouth daily.    [provider]  aspirin  81 MG tablet Take 81 mg by mouth daily.    [provider]  cholecalciferol  (VITAMIN D ) 1000 UNITS tablet Take  1,000 Units by mouth daily.    [provider]  clopidogrel  (PLAVIX ) 75 MG tablet Take 1 tablet (75 mg total) by mouth daily. 11/14/23   Meng, Hao, PA  Coenzyme Q10 (CO Q 10) 100 MG CAPS Take 300 mg by mouth every evening.    [provider]  dorzolamide (TRUSOPT) 2 % ophthalmic solution Place 1 drop into both eyes daily.    [provider]  ezetimibe  (ZETIA ) 10 MG tablet Take 1 tablet (10 mg total) by mouth daily. 11/14/23   Meng, Hao, PA  isosorbide  mononitrate (IMDUR ) 120 MG 24 hr tablet Take 1 tablet (120 mg total) by mouth daily. 11/14/23   Meng, Hao, PA  metoprolol  tartrate (LOPRESSOR ) 25 MG tablet PLEASE TAKE 12.5MG  BID 11/29/23   Conte, Tessa N, PA-C  nitroGLYCERIN  (NITROSTAT ) 0.4 MG SL tablet Place 1 tablet (0.4 mg total) under the tongue every 5 (five) minutes as needed for chest pain. 11/14/23   Meng, Hao, PA  omega-3 acid ethyl esters (LOVAZA ) 1 g capsule TAKE 1 CAPSULE BY MOUTH 2 TIMES DAILY. 07/08/23   Burnard Debby LABOR, MD  ranolazine  (RANEXA ) 1000 MG SR tablet Take 1 tablet (1,000 mg total) by mouth 2 (two) times daily. 11/14/23   Meng, Hao, PA  rosuvastatin  (CRESTOR ) 40 MG tablet Take 1 tablet (40 mg total) by mouth daily. 11/14/23   Meng, Hao, PA  sodium zirconium cyclosilicate  (LOKELMA ) 10  g PACK packet Take 10 g (1 packet total) by mouth as needed per instruction by provider based on blood work.. Patient not taking: Reported on 10/17/2023 10/08/23   Meng, Hao, PA  valsartan  (DIOVAN ) 80 MG tablet Take 1 tablet (80 mg total) by mouth daily. 11/14/23   Meng, Hao, PA  vitamin B-12 (CYANOCOBALAMIN ) 100 MCG tablet Take 100 mcg by mouth daily.     [provider]    Allergies: Patient has no known allergies.    Review of Systems  Constitutional:  Positive for fever.  Respiratory:  Positive for cough and shortness of breath.   Cardiovascular:  Positive for chest pain.    Updated Vital Signs BP 120/67   Pulse 85   Temp 99.4 F (37.4 C) (Oral)   Resp (!)  29   Ht 5' 10 (1.778 m)   Wt 92.1 kg   SpO2 97%   BMI 29.13 kg/m   Physical Exam Vitals and nursing note reviewed.  Constitutional:      Appearance: He is well-developed.  HENT:     Head: Normocephalic and atraumatic.  Eyes:     Pupils: Pupils are equal, round, and reactive to light.  Neck:     Vascular: No JVD.  Cardiovascular:     Rate and Rhythm: Normal rate and regular rhythm.     Heart sounds: No murmur heard.    No friction rub. No gallop.  Pulmonary:     Effort: No respiratory distress.     Breath sounds: No wheezing.  Abdominal:     General: There is no distension.     Tenderness: There is no abdominal tenderness. There is no guarding or rebound.  Musculoskeletal:        General: Normal range of motion.     Cervical back: Normal range of motion and neck supple.  Skin:    Coloration: Skin is not pale.     Findings: No rash.  Neurological:     Mental Status: He is alert and oriented to person, place, and time.  Psychiatric:        Behavior: Behavior normal.     (all labs ordered are listed, but only abnormal results are displayed) Labs Reviewed  RESP PANEL BY RT-PCR (RSV, FLU A&B, COVID)  RVPGX2 - Abnormal; Notable for the following components:      Result Value   SARS Coronavirus 2 by RT PCR POSITIVE (*)    All other components within normal limits  BASIC METABOLIC PANEL WITH GFR - Abnormal; Notable for the following components:   Glucose, Bld 109 (*)    Creatinine, Ser 2.26 (*)    GFR, Estimated 29 (*)    All other components within normal limits  TROPONIN T, HIGH SENSITIVITY - Abnormal; Notable for the following components:   Troponin T High Sensitivity 27 (*)    All other components within normal limits  CULTURE, BLOOD (ROUTINE X 2)  CULTURE, BLOOD (ROUTINE X 2)  CBC  LIPASE, BLOOD  LACTIC ACID, PLASMA  HEPATIC FUNCTION PANEL  TROPONIN T, HIGH SENSITIVITY    EKG: EKG Interpretation Date/Time:  Sunday January 05 2024 18:14:46 EDT Ventricular  Rate:  91 PR Interval:  237 QRS Duration:  88 QT Interval:  289 QTC Calculation: 356 R Axis:   60  Text Interpretation: Sinus tachycardia Multiple ventricular premature complexes Prolonged PR interval Probable left atrial enlargement Nonspecific T abnormalities, diffuse leads No significant change since last tracing Confirmed by Emil Share [phone]) on 01/05/2024 6:21:46  PM  Radiology: [facility] Chest Port 1 View Result Date: 01/05/2024 CLINICAL DATA:  Cough and fever EXAM: PORTABLE CHEST 1 VIEW COMPARISON:  08/16/2023. FINDINGS: Enlarged cardiac silhouette. Status post median sternotomy and CABG. Calcified aorta. No pneumothorax or pleural effusion. Alveolar opacity in the left lung base consistent with pneumonia. IMPRESSION: Left base alveolar consolidation consistent with pneumonia. Electronically Signed   By: Fonda Field M.D.   On: 01/05/2024 18:40     Procedures   Medications Ordered in the ED  remdesivir  200 mg in sodium chloride  0.9% 250 mL IVPB (has no administration in time range)    Followed by  remdesivir  100 mg in sodium chloride  0.9 % 100 mL IVPB (has no administration in time range)  sodium chloride  0.9 % bolus 1,000 mL (1,000 mLs Intravenous New Bag/Given 01/05/24 1848)  ondansetron  (ZOFRAN ) injection 4 mg (4 mg Intravenous Given 01/05/24 1845)  acetaminophen  (TYLENOL ) tablet 1,000 mg (1,000 mg Oral Given 01/05/24 1845)  amoxicillin -clavulanate (AUGMENTIN ) 875-125 MG per tablet 1 tablet (1 tablet Oral Given 01/05/24 1953)  azithromycin  (ZITHROMAX ) tablet 500 mg (500 mg Oral Given 01/05/24 1953)                                    Medical Decision Making Amount and/or Complexity of Data Reviewed Labs: ordered. Radiology: ordered.  Risk OTC drugs. Prescription drug management.   78 yo M with a chief complaints of sounds like a viral syndrome cough congestion fevers chills myalgias going on since yesterday.  Recently was at a family reunion and his brother was also  sick.  Will obtain a chest x-ray blood work bolus of IV fluids reassess.  Chest x-ray on my independent interpretation with a hard to visualize heart border on the left consistent with left lower lobe pneumonia.  Patient is COVID-positive so more likely to be COVID-pneumonia than bacterial.  I discussed limitations of antiviral therapy for COVID with the patient.  He would like to try a medication for home.  Unfortunately renal function is mildly worse here.  Will discuss with medicine for admission.   I discussed case with Dr. Alfornia, recommends remdesivir .  Will attempt to order here but am not sure they have it on formulary.  The patients results and plan were reviewed and discussed.   Any x-rays performed were independently reviewed by myself.   Differential diagnosis were considered with the presenting HPI.  Medications  remdesivir  200 mg in sodium chloride  0.9% 250 mL IVPB (has no administration in time range)    Followed by  remdesivir  100 mg in sodium chloride  0.9 % 100 mL IVPB (has no administration in time range)  sodium chloride  0.9 % bolus 1,000 mL (1,000 mLs Intravenous New Bag/Given 01/05/24 1848)  ondansetron  (ZOFRAN ) injection 4 mg (4 mg Intravenous Given 01/05/24 1845)  acetaminophen  (TYLENOL ) tablet 1,000 mg (1,000 mg Oral Given 01/05/24 1845)  amoxicillin -clavulanate (AUGMENTIN ) 875-125 MG per tablet 1 tablet (1 tablet Oral Given 01/05/24 1953)  azithromycin  (ZITHROMAX ) tablet 500 mg (500 mg Oral Given 01/05/24 1953)    Vitals:   01/05/24 1915 01/05/24 1930 01/05/24 1945 01/05/24 2000  BP: (!) 164/70 (!) 140/68 136/67 120/67  Pulse: 86 86 87 85  Resp: (!) 35 (!) 28 (!) 30 (!) 29  Temp:      TempSrc:      SpO2: 99% 99% 98% 97%  Weight:      Height:  Final diagnoses:  Community acquired pneumonia of left lower lobe of lung  COVID-19 virus infection    Admission/ observation were discussed with the admitting physician, patient and/or family and they are  comfortable with the plan.     Medications given during this visit Medications  remdesivir  200 mg in sodium chloride  0.9% 250 mL IVPB (has no administration in time range)    Followed by  remdesivir  100 mg in sodium chloride  0.9 % 100 mL IVPB (has no administration in time range)  sodium chloride  0.9 % bolus 1,000 mL (1,000 mLs Intravenous New Bag/Given 01/05/24 1848)  ondansetron  (ZOFRAN ) injection 4 mg (4 mg Intravenous Given 01/05/24 1845)  acetaminophen  (TYLENOL ) tablet 1,000 mg (1,000 mg Oral Given 01/05/24 1845)  amoxicillin -clavulanate (AUGMENTIN ) 875-125 MG per tablet 1 tablet (1 tablet Oral Given 01/05/24 1953)  azithromycin  (ZITHROMAX ) tablet 500 mg (500 mg Oral Given 01/05/24 1953)        Final diagnoses:  Community acquired pneumonia of left lower lobe of lung  COVID-19 virus infection    ED Discharge Orders          Ordered    amoxicillin -clavulanate (AUGMENTIN ) 875-125 MG tablet  Every 12 hours        01/05/24 1925    azithromycin  (ZITHROMAX ) 250 MG tablet  Daily        01/05/24 1925    benzonatate  (TESSALON ) 100 MG capsule  Every 8 hours        01/05/24 1925    ondansetron  (ZOFRAN -ODT) 4 MG disintegrating tablet        01/05/24 1925    molnupiravir  EUA (LAGEVRIO ) 200 mg CAPS capsule  2 times daily        01/05/24 1925               Emil Share, DO 01/05/24 2104

## 2024-01-05 NOTE — Plan of Care (Signed)
 Plan of Care Note for accepted transfer   Patient name: Joshua Hahn FMW:[id number] DOB: 27-Jun-1945  Facility requesting transfer: [facility] ED Requesting Provider: Dr. Emil Facility course: 78 year old male with history of CAD, hypertension, hyperlipidemia, CKD stage IIIb, prostate cancer, sleep apnea presented with complaints of cough, shortness of breath, chest pain with coughing, and fever x 1 to 2 days.  Not hypoxic but placed on 2 L [place] due to tachypnea.  EKG without STEMI.  Labs showing no leukocytosis, creatinine 2.2 (previously 1.7-2.0), initial troponin 27 and repeat pending, SARS-CoV-2 PCR positive, lactic acid normal.  Chest x-ray showing left base alveolar consolidation consistent with pneumonia.  Patient was given Tylenol , Augmentin , azithromycin , Zofran , and 1 L normal saline.  Plan of care: The patient is accepted for admission to Progressive unit at [facility].  [facility] will assume care on arrival to accepting facility. Until arrival, care as per EDP. However, [facility] available 24/7 for questions and assistance.  Check [email or web address] for on-call coverage.  Nursing staff, please call [facility] Admits & Consults System-Wide number under Amion on patient's arrival so appropriate admitting provider can evaluate the pt.

## 2024-01-05 NOTE — H&P (Incomplete)
 History and Physical    Joshua Hahn FMW:[id number] DOB: 1945-12-15 DOA: 01/05/2024  PCP: Pcp, No   Patient coming from: Home   Chief Complaint:  Chief Complaint  Patient presents with   Chest Pain   Cough   Fever   Shortness of Breath   ED TRIAGE note:Pt arrives POV to ED for CP/SOB/cough/fever that started yesterday. Reports SOB on exertion and at rest, nonradiating central CP, new fever and cough. Pt recently in cardiac rehab for new stents that were replaced at the end of July. Afebrile in triage. A/Ox4, RR equal and unlabored. Placed on heart monitor and PIV inserted blood drawn. Wife at bedside.   HPI:  Joshua Hahn is a 78 y.o. male with medical history significant of CAD s/p CABG in 09/2023, hypertension, hyperlipidemia, CKD stage IIIb, prostate cancer and sleep apnea presented with complaints of cough, shortness of breath, chest pain with coughing, and fever x 1 to 2 days.   At presentation to ED patient is hemodynamically stable and afebrile. Not hypoxic but placed on 2 L [place] due to tachypnea.  EKG without STEMI.  Labs showing no leukocytosis, creatinine 2.2 (previously 1.7-2.0), initial troponin 27 and repeat pending, SARS-CoV-2 PCR positive, lactic acid normal.  Chest x-ray showing left base alveolar consolidation consistent with pneumonia.  Patient was given Tylenol , remdesivir , Augmentin , azithromycin , Zofran , and 1 L normal saline.   Patient has been transferred to [facility] for management of COVID-19 pneumonia, AKI on CKD stage IIIb and elevated troponin secondary to demand ischemia.  During my evaluation at the bedside patient is resting comfortably in the bed.  Having productive cough.  Denies any chest pain, shortness of breath, palpitation, fever, nausea, vomiting, and diarrhea.  No known sick contact.   Significant labs in the ED: Lab Orders         Resp panel by RT-PCR (RSV, Flu A&B, Covid) Anterior Nasal Swab         Blood culture (routine x 2)          Expectorated Sputum Assessment w Gram Stain, Rflx to Resp Cult         CBC         Lipase, blood         Lactic acid, plasma         Hepatic function panel         Legionella Pneumophila Serogp 1 Ur Ag         Strep pneumoniae urinary antigen         Urinalysis, Routine w reflex microscopic -Urine, Clean Catch         Creatinine, urine, random         Sodium, urine, random         Comprehensive metabolic panel         CBC         Procalcitonin       Review of Systems:  Review of Systems  Constitutional:  Negative for chills, fever and weight loss.  Respiratory:  Positive for cough and sputum production. Negative for hemoptysis, shortness of breath and wheezing.   Gastrointestinal:  Negative for diarrhea, heartburn and nausea.  Musculoskeletal:  Negative for falls, joint pain and myalgias.  Neurological:  Negative for dizziness and headaches.  Psychiatric/Behavioral:  The patient is not nervous/anxious.     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 levels LDL IIIa+b% and HDL2b%   Hypertension    Myocardial infarct [facility])    Myocardial infarction (HCC) 1994   inferior wall MI [facility]   Prostate cancer [facility])    Sleep apnea    uses CPAP PRN   Sleep apnea, obstructive    using C-PAP    Past Surgical History:  Procedure Laterality Date   ABDOMINAL SURGERY     CARDIAC CATHETERIZATION  06/23/2008   occlusion sequential OM1 andOM2 graft as well vein graft to RCA.LAD and diagonal system remain intact. RCA  collateralized   CORONARY ANGIOPLASTY WITH STENT PLACEMENT     CORONARY ARTERY BYPASS GRAFT  1994    CORONARY STENT INTERVENTION N/A 09/20/2023   Procedure: CORONARY STENT INTERVENTION;  Surgeon: Wonda Sharper, MD;  Location: [facility] INVASIVE CV LAB;  Service: Cardiovascular;  Laterality: N/A;   EXPLORATORY LAPAROTOMY     ideopathic retroperitoneal fibrosis   LEFT HEART CATH AND CORS/GRAFTS ANGIOGRAPHY N/A 09/20/2023   Procedure: LEFT HEART CATH AND CORS/GRAFTS ANGIOGRAPHY;  Surgeon: Wonda Sharper, MD;  Location: [facility] INVASIVE CV LAB;  Service: Cardiovascular;  Laterality: N/A;   LEFT HEART CATHETERIZATION WITH CORONARY/GRAFT ANGIOGRAM N/A 02/04/2013   Procedure: LEFT HEART CATHETERIZATION WITH EL BILE;  Surgeon: Debby DELENA Sor, MD;  Location: [facility] CATH LAB;  Service: Cardiovascular;  Laterality: N/A;   LEFT PAROTID GLAND SURGRY  05/2009   Dr. Arlana   PERCUTANEOUS CORONARY STENT INTERVENTION (PCI-S)  06/25/2008   rotational atherectomy w/diffuse disease native circ. w/drug eluting stents , PTCA on one vessel; grat occlusion to RCA w/native RCA proxim. occlusion. Circ.supplied collaaterals distal RCA  following intervention   PERCUTANEOUS CORONARY STENT INTERVENTION (PCI-S) N/A 02/05/2013   Procedure: PERCUTANEOUS CORONARY STENT INTERVENTION (PCI-S);  Surgeon: Debby DELENA Sor, MD;  Location: [facility] CATH LAB;  Service: Cardiovascular;  Laterality: N/A;   PROSTATECTOMY  06/2009   robotic-asssisted laparoscopic radical by Dr Nicholaus     reports that he quit smoking about 50 years ago. His smoking use included cigarettes. He has never been exposed to tobacco smoke. He has never used smokeless tobacco. He reports that he does not drink alcohol and does not use drugs.  No Known Allergies  Family History  Problem Relation Age of Onset   Pancreatic cancer Father    Heart disease Father    Hypertension Father    Stroke Mother    Hypertension Mother    Coronary artery disease Brother 9       2 bros with early CAD   Hypertension Brother    Coronary artery disease Brother    Hypertension  Brother    Coronary artery disease Brother    Hypertension Brother    Heart attack Maternal Grandfather    Hypertension Paternal Grandfather    Lupus Daughter    Hypertension Son    Hypertension Sister    Hypertension Sister    Throat cancer Brother    Colon cancer Neg Hx    Esophageal cancer Neg Hx    Stomach cancer Neg Hx    Rectal cancer Neg Hx     Prior to Admission medications   Medication Sig Start Date End Date Taking? Authorizing Provider  amoxicillin -clavulanate (AUGMENTIN ) 875-125 MG tablet  Take 1 tablet by mouth every 12 (twelve) hours. 01/05/24  Yes Emil Share, DO  azithromycin  (ZITHROMAX ) 250 MG tablet Take 1 tablet (250 mg total) by mouth daily. 01/05/24  Yes Emil Share, DO  benzonatate  (TESSALON ) 100 MG capsule Take 1 capsule (100 mg total) by mouth every 8 (eight) hours. 01/05/24  Yes Emil Share, DO  molnupiravir  EUA (LAGEVRIO ) 200 mg CAPS capsule Take 4 capsules (800 mg total) by mouth 2 (two) times daily for 5 days. 01/05/24 01/10/24 Yes Emil Share, DO  ondansetron  (ZOFRAN -ODT) 4 MG disintegrating tablet 4mg  ODT q4 hours prn nausea/vomit 01/05/24  Yes Floyd, Dan, DO  amLODipine  (NORVASC ) 5 MG tablet Take 1.5 tablets (7.5 mg total) by mouth daily. 11/14/23   Meng, Hao, PA  Ascorbic Acid  (VITAMIN C  PO) Take 1 tablet by mouth daily.    [provider]  aspirin  81 MG tablet Take 81 mg by mouth daily.    [provider]  cholecalciferol  (VITAMIN D ) 1000 UNITS tablet Take 1,000 Units by mouth daily.    [provider]  clopidogrel  (PLAVIX ) 75 MG tablet Take 1 tablet (75 mg total) by mouth daily. 11/14/23   Meng, Hao, PA  Coenzyme Q10 (CO Q 10) 100 MG CAPS Take 300 mg by mouth every evening.    [provider]  dorzolamide (TRUSOPT) 2 % ophthalmic solution Place 1 drop into both eyes daily.    [provider]  ezetimibe  (ZETIA ) 10 MG tablet Take 1 tablet (10 mg total) by mouth daily. 11/14/23   Meng, Hao, PA  isosorbide  mononitrate (IMDUR )  120 MG 24 hr tablet Take 1 tablet (120 mg total) by mouth daily. 11/14/23   Meng, Hao, PA  metoprolol  tartrate (LOPRESSOR ) 25 MG tablet PLEASE TAKE 12.5MG  BID 11/29/23   Conte, Tessa N, PA-C  nitroGLYCERIN  (NITROSTAT ) 0.4 MG SL tablet Place 1 tablet (0.4 mg total) under the tongue every 5 (five) minutes as needed for chest pain. 11/14/23   Meng, Hao, PA  omega-3 acid ethyl esters (LOVAZA ) 1 g capsule TAKE 1 CAPSULE BY MOUTH 2 TIMES DAILY. 07/08/23   Burnard Debby LABOR, MD  ranolazine  (RANEXA ) 1000 MG SR tablet Take 1 tablet (1,000 mg total) by mouth 2 (two) times daily. 11/14/23   Meng, Hao, PA  rosuvastatin  (CRESTOR ) 40 MG tablet Take 1 tablet (40 mg total) by mouth daily. 11/14/23   Meng, Hao, PA  sodium zirconium cyclosilicate  (LOKELMA ) 10 g PACK packet Take 10 g (1 packet total) by mouth as needed per instruction by provider based on blood work.. Patient not taking: Reported on 10/17/2023 10/08/23   Meng, Hao, PA  valsartan  (DIOVAN ) 80 MG tablet Take 1 tablet (80 mg total) by mouth daily. 11/14/23   Meng, Hao, PA  vitamin B-12 (CYANOCOBALAMIN ) 100 MCG tablet Take 100 mcg by mouth daily.     [provider]     Physical Exam: Vitals:   01/05/24 2100 01/05/24 2233 01/05/24 2236 01/06/24 0008  BP: 136/71 (!) 148/67    Pulse: 80 71    Resp: (!) 26 18  (!) 23  Temp:  98.3 F (36.8 C)    TempSrc:  Oral    SpO2: 98% 99%    Weight:   91.6 kg   Height:   5' 10 (1.778 m)     Physical Exam Vitals and nursing note reviewed.  Constitutional:      General: He is not in acute distress.    Appearance: He is not ill-appearing.  Cardiovascular:  Rate and Rhythm: Normal rate and regular rhythm.     Heart sounds: Normal heart sounds.  Pulmonary:     Effort: Pulmonary effort is normal.     Breath sounds: Normal breath sounds. No decreased breath sounds, wheezing, rhonchi or rales.  Abdominal:     Palpations: Abdomen is soft.  Skin:    Capillary Refill: Capillary refill takes less than 2  seconds.  Neurological:     Mental Status: He is alert and oriented to person, place, and time.  Psychiatric:        Mood and Affect: Mood normal.      Labs on Admission: I have personally reviewed following labs and imaging studies  CBC: Recent Labs  Lab 01/05/24 1818  WBC 9.2  HGB 14.1  HCT 43.7  MCV 91.8  PLT 181   Basic Metabolic Panel: Recent Labs  Lab 01/05/24 1818  NA 137  K 4.6  CL 100  CO2 26  GLUCOSE 109*  BUN 21  CREATININE 2.26*  CALCIUM  10.3   GFR: Estimated Creatinine Clearance: 30.1 mL/min (A) (by C-G formula based on SCr of 2.26 mg/dL (H)). Liver Function Tests: Recent Labs  Lab 01/05/24 1840  AST 33  ALT 18  ALKPHOS 74  BILITOT 0.5  PROT 7.5  ALBUMIN 4.4   Recent Labs  Lab 01/05/24 1818  LIPASE 23   No results for input(s): AMMONIA in the last 168 hours. Coagulation Profile: No results for input(s): INR, PROTIME in the last 168 hours. Cardiac Enzymes: No results for input(s): CKTOTAL, CKMB, CKMBINDEX, TROPONINI, TROPONINIHS in the last 168 hours. BNP (last 3 results) Recent Labs    08/16/23 1230  BNP 33.5   HbA1C: No results for input(s): HGBA1C in the last 72 hours. CBG: No results for input(s): GLUCAP in the last 168 hours. Lipid Profile: No results for input(s): CHOL, HDL, LDLCALC, TRIG, CHOLHDL, LDLDIRECT in the last 72 hours. Thyroid  Function Tests: No results for input(s): TSH, T4TOTAL, FREET4, T3FREE, THYROIDAB in the last 72 hours. Anemia Panel: No results for input(s): VITAMINB12, FOLATE, FERRITIN, TIBC, IRON, RETICCTPCT in the last 72 hours. Urine analysis:    Component Value Date/Time   COLORURINE YELLOW 10/24/2009 0229   APPEARANCEUR CLEAR 10/24/2009 0229   LABSPEC 1.006 10/24/2009 0229   PHURINE 7.0 10/24/2009 0229   GLUCOSEU NEGATIVE 10/24/2009 0229   HGBUR NEGATIVE 10/24/2009 0229   BILIRUBINUR NEGATIVE 10/24/2009 0229   KETONESUR NEGATIVE 10/24/2009  0229   PROTEINUR NEGATIVE 10/24/2009 0229   UROBILINOGEN 0.2 10/24/2009 0229   NITRITE NEGATIVE 10/24/2009 0229   LEUKOCYTESUR  10/24/2009 0229    NEGATIVE MICROSCOPIC NOT DONE ON URINES WITH NEGATIVE PROTEIN, BLOOD, LEUKOCYTES, NITRITE, OR GLUCOSE <1000 mg/dL.    Radiological Exams on Admission: I have personally reviewed images DG Chest Port 1 View Result Date: 01/05/2024 CLINICAL DATA:  Cough and fever EXAM: PORTABLE CHEST 1 VIEW COMPARISON:  08/16/2023. FINDINGS: Enlarged cardiac silhouette. Status post median sternotomy and CABG. Calcified aorta. No pneumothorax or pleural effusion. Alveolar opacity in the left lung base consistent with pneumonia. IMPRESSION: Left base alveolar consolidation consistent with pneumonia. Electronically Signed   By: Fonda Field M.D.   On: 01/05/2024 18:40     EKG: My personal interpretation of EKG shows:  Sinus tach at heart rate 91, multiple premature ventricular complex.   Assessment/Plan: Principal Problem:   Pneumonia due to COVID-19 virus Active Problems:   CAP (community acquired pneumonia)   Hyperlipidemia   Essential hypertension   OSA on  CPAP   Acute kidney injury superimposed on chronic kidney disease (HCC)   History of CAD (coronary artery disease)   History of prostate cancer   Elevated troponin-secondary to demand ischemia    Assessment and Plan: Pneumonia due to COVID-19 Community-acquired pneumonia -Presented to emergency department complaining of cough, shortness of breath, fever, chest pressure for last 24 hours.  Patient reported shortness of breath on exertion at rest.  Nonradiating central chest pressure with associated fever and cough. - At presentation to ED patient found hemodynamically stable.  Due to significant tachypnea patient has been placed on 2 L nasal cannula oxygen. - Labs, CBC unremarkable.  BMP showing elevated creatinine 2.26.  April troponin 27.  EKGshowed normal sinus rhythm with premature ventricular  complex.  Respiratory panel positive with COVID.  Normal lactic acid level.  Normal hepatic function panel. -Chest x-ray showed enlarged cardiac silhouette, status post medial hysterectomy.  Left basilar alveolar consolidation consistent pneumonia. - In the ED patient has been treated with remdesivir , azithromycin  and Augmentin . Patient has been transferred to his long hospital for management of COVID-19 pneumonia. - Plan to continue remdesivir  with pharmacy consult - Continue supportive care with DuoNeb continue pulse ox supplemental oxygen as needed and CPAP at bedtime. - Continue IV ceftriaxone  and IV doxycycline . - Need to follow-up with blood culture, sputum culture, urine Legionella urine strep and procalcitonin level.  Elevated troponin secondary to demand ischemia Repeated troponin 27.  EKG showed normal sinus rhythm with premature ventricular complex.  Patient denies any chest pain.  Elevated troponin in the setting of demand ischemia in the context of pneumonia. - Will obtain echocardiogram given patient has extensive cardiac history and recent CABG.  Acute kidney injury superimposed Kd stage IIIb -Elevated creatinine 2.23.  Acute kidney injury in the setting of pneumonia. - Continue maintenance fluid LR at 75 cc/h.  Monitor urine output.-Nephrotoxic agent.  Renally adjust medication. -Holding valsartan  in the setting of AKI.   Obstructive sleep apnea -Continue CPAP at bedtime.   CAD status post CABG May 2025 -Continue aspirin , Plavix , Lopressor , Imdur  and Ranexa .  Essential hypertension -Continue amlodipine , Imdur  and Lopressor .  Hold valsartan  in the setting of AKI  History of prostate cancer - Patient reported history of prostate cancer in remission.  Unable to find further records on the chart.  Hyperlipidemia -Continue Crestor    DVT prophylaxis:  SQ Heparin  Code Status:  Full Code Diet: Heart healthy diet Family Communication:   Family was present at bedside, at  the time of interview. Opportunity was given to ask question and all questions were answered satisfactorily.  Disposition Plan: Need to follow-up with culture results. Consults: Respiratory therapist for CPAP management Admission status:   Inpatient, progressive unit  Severity of Illness: The appropriate patient status for this patient is INPATIENT. Inpatient status is judged to be reasonable and necessary in order to provide the required intensity of service to ensure the patient's safety. The patient's presenting symptoms, physical exam findings, and initial radiographic and laboratory data in the context of their chronic comorbidities is felt to place them at high risk for further clinical deterioration. Furthermore, it is not anticipated that the patient will be medically stable for discharge from the hospital within 2 midnights of admission.   * I certify that at the point of admission it is my clinical judgment that the patient will require inpatient hospital care spanning beyond 2 midnights from the point of admission due to high intensity of service, high risk for further deterioration  and high frequency of surveillance required.DEWAINE    Kanyah Matsushima, MD [facility]  How to contact the [facility] Attending or Consulting provider 7A - 7P or covering provider during after hours 7P -7A, for this patient.  Check the care team in [facility] and look for a) attending/consulting [facility] provider listed and b) the [facility] team listed Log into [email or web address] and use 's universal password to access. If you do not have the password, please contact the hospital operator. Locate the [facility] provider you are looking for under [facility] and page to a number that you can be directly reached. If you still have difficulty reaching the provider, please page the [facility] (Director on Call) for the Hospitalists listed on amion for assistance.  01/06/2024, 12:15 AM

## 2024-01-05 NOTE — ED Triage Notes (Signed)
 Pt arrives POV to ED for CP/SOB/cough/fever that started yesterday. Reports SOB on exertion and at rest, nonradiating central CP, new fever and cough. Pt recently in cardiac rehab for new stents that were replaced at the end of July. Afebrile in triage. A/Ox4, RR equal and unlabored. Placed on heart monitor and PIV inserted blood drawn. Wife at bedside.

## 2024-01-05 NOTE — ED Notes (Signed)
 RR 28-38 still feel SHOB, SPO2 100% after placing on 2lpm [place].

## 2024-01-06 ENCOUNTER — Observation Stay (HOSPITAL_COMMUNITY)

## 2024-01-06 DIAGNOSIS — Z79899 Other long term (current) drug therapy: Secondary | ICD-10-CM | POA: Diagnosis not present

## 2024-01-06 DIAGNOSIS — I129 Hypertensive chronic kidney disease with stage 1 through stage 4 chronic kidney disease, or unspecified chronic kidney disease: Secondary | ICD-10-CM | POA: Diagnosis present

## 2024-01-06 DIAGNOSIS — Z823 Family history of stroke: Secondary | ICD-10-CM | POA: Diagnosis not present

## 2024-01-06 DIAGNOSIS — E785 Hyperlipidemia, unspecified: Secondary | ICD-10-CM | POA: Diagnosis present

## 2024-01-06 DIAGNOSIS — Z8249 Family history of ischemic heart disease and other diseases of the circulatory system: Secondary | ICD-10-CM | POA: Diagnosis not present

## 2024-01-06 DIAGNOSIS — Z87891 Personal history of nicotine dependence: Secondary | ICD-10-CM | POA: Diagnosis not present

## 2024-01-06 DIAGNOSIS — Z7982 Long term (current) use of aspirin: Secondary | ICD-10-CM | POA: Diagnosis not present

## 2024-01-06 DIAGNOSIS — J1282 Pneumonia due to coronavirus disease 2019: Secondary | ICD-10-CM | POA: Diagnosis present

## 2024-01-06 DIAGNOSIS — Z808 Family history of malignant neoplasm of other organs or systems: Secondary | ICD-10-CM | POA: Diagnosis not present

## 2024-01-06 DIAGNOSIS — I252 Old myocardial infarction: Secondary | ICD-10-CM | POA: Diagnosis not present

## 2024-01-06 DIAGNOSIS — U071 COVID-19: Secondary | ICD-10-CM | POA: Diagnosis present

## 2024-01-06 DIAGNOSIS — Z800 Family history of malignant neoplasm of digestive organs: Secondary | ICD-10-CM | POA: Diagnosis not present

## 2024-01-06 DIAGNOSIS — G4733 Obstructive sleep apnea (adult) (pediatric): Secondary | ICD-10-CM | POA: Diagnosis present

## 2024-01-06 DIAGNOSIS — R079 Chest pain, unspecified: Secondary | ICD-10-CM

## 2024-01-06 DIAGNOSIS — N179 Acute kidney failure, unspecified: Secondary | ICD-10-CM | POA: Diagnosis present

## 2024-01-06 DIAGNOSIS — Z955 Presence of coronary angioplasty implant and graft: Secondary | ICD-10-CM | POA: Diagnosis not present

## 2024-01-06 DIAGNOSIS — R7989 Other specified abnormal findings of blood chemistry: Secondary | ICD-10-CM | POA: Diagnosis not present

## 2024-01-06 DIAGNOSIS — J189 Pneumonia, unspecified organism: Secondary | ICD-10-CM | POA: Diagnosis present

## 2024-01-06 DIAGNOSIS — I2489 Other forms of acute ischemic heart disease: Secondary | ICD-10-CM | POA: Diagnosis present

## 2024-01-06 DIAGNOSIS — N1832 Chronic kidney disease, stage 3b: Secondary | ICD-10-CM | POA: Diagnosis present

## 2024-01-06 DIAGNOSIS — Z8546 Personal history of malignant neoplasm of prostate: Secondary | ICD-10-CM | POA: Diagnosis not present

## 2024-01-06 DIAGNOSIS — Z7902 Long term (current) use of antithrombotics/antiplatelets: Secondary | ICD-10-CM | POA: Diagnosis not present

## 2024-01-06 DIAGNOSIS — Z951 Presence of aortocoronary bypass graft: Secondary | ICD-10-CM | POA: Diagnosis not present

## 2024-01-06 DIAGNOSIS — I2510 Atherosclerotic heart disease of native coronary artery without angina pectoris: Secondary | ICD-10-CM | POA: Diagnosis present

## 2024-01-06 LAB — COMPREHENSIVE METABOLIC PANEL WITH GFR
ALT: 14 U/L (ref 0–44)
AST: 25 U/L (ref 15–41)
Albumin: 3.6 g/dL (ref 3.5–5.0)
Alkaline Phosphatase: 64 U/L (ref 38–126)
Anion gap: 11 (ref 5–15)
BUN: 20 mg/dL (ref 8–23)
CO2: 24 mmol/L (ref 22–32)
Calcium: 9.6 mg/dL (ref 8.9–10.3)
Chloride: 105 mmol/L (ref 98–111)
Creatinine, Ser: 2.22 mg/dL — ABNORMAL HIGH (ref 0.61–1.24)
GFR, Estimated: 29 mL/min — ABNORMAL LOW (ref >60)
Glucose, Bld: 122 mg/dL — ABNORMAL HIGH (ref 70–99)
Potassium: 4.4 mmol/L (ref 3.5–5.1)
Sodium: 139 mmol/L (ref 135–145)
Total Bilirubin: 0.3 mg/dL (ref 0.0–1.2)
Total Protein: 6.2 g/dL — ABNORMAL LOW (ref 6.5–8.1)

## 2024-01-06 LAB — ECHOCARDIOGRAM COMPLETE
Area-P 1/2: 2.26 cm2
Calc EF: 55.1 %
Height: 70 in
S' Lateral: 3.6 cm
Single Plane A2C EF: 60.3 %
Single Plane A4C EF: 52.3 %
Weight: 3231.06 [oz_av]

## 2024-01-06 LAB — PROCALCITONIN: Procalcitonin: <0.1 ng/mL

## 2024-01-06 LAB — URINALYSIS, ROUTINE W REFLEX MICROSCOPIC
Bilirubin Urine: NEGATIVE
Glucose, UA: NEGATIVE mg/dL
Hgb urine dipstick: NEGATIVE
Ketones, ur: NEGATIVE mg/dL
Leukocytes,Ua: NEGATIVE
Nitrite: NEGATIVE
Protein, ur: NEGATIVE mg/dL
Specific Gravity, Urine: 1.004 — ABNORMAL LOW (ref 1.005–1.030)
pH: 5 (ref 5.0–8.0)

## 2024-01-06 LAB — CBC
HCT: 41.7 % (ref 39.0–52.0)
Hemoglobin: 13.1 g/dL (ref 13.0–17.0)
MCH: 30 pg (ref 26.0–34.0)
MCHC: 31.4 g/dL (ref 30.0–36.0)
MCV: 95.4 fL (ref 80.0–100.0)
Platelets: 141 K/uL — ABNORMAL LOW (ref 150–400)
RBC: 4.37 MIL/uL (ref 4.22–5.81)
RDW: 12.8 % (ref 11.5–15.5)
WBC: 8.3 K/uL (ref 4.0–10.5)
nRBC: 0 % (ref 0.0–0.2)

## 2024-01-06 LAB — STREP PNEUMONIAE URINARY ANTIGEN: Strep Pneumo Urinary Antigen: NEGATIVE

## 2024-01-06 LAB — TROPONIN T, HIGH SENSITIVITY: Troponin T High Sensitivity: 25 ng/L — ABNORMAL HIGH (ref 0–19)

## 2024-01-06 LAB — CREATININE, URINE, RANDOM: Creatinine, Urine: 30 mg/dL

## 2024-01-06 LAB — SODIUM, URINE, RANDOM: Sodium, Ur: <30 mmol/L

## 2024-01-06 MED ORDER — MENTHOL 3 MG MT LOZG
1.0000 | LOZENGE | OROMUCOSAL | Status: DC | PRN
  Administered 2024-01-06: 3 mg via ORAL
  Filled 2024-01-06 (×2): qty 9

## 2024-01-06 MED ORDER — ORAL CARE MOUTH RINSE: 15.0000 mL | OROMUCOSAL | Status: DC | PRN

## 2024-01-06 NOTE — Progress Notes (Signed)
   01/06/24 0008  BiPAP/CPAP/SIPAP  Reason BIPAP/CPAP not in use Other(comment) (ORDER IN FOR CPAP TONIGHT COMING UP TONIGHT, BUT PATIENT STATED THAT HE REALLY DOESN'T WANT TO WEAR OUR CPAP BECAUSE HE ILS HOPING TO JUST BE IN THE HOSPITAL FOR A NIGHT OR TWO.)

## 2024-01-06 NOTE — Progress Notes (Signed)
 PROGRESS NOTE    Joshua Hahn  FMW:[id number] DOB: 30-Jun-1945 DOA: 01/05/2024 PCP: Freddrick, No    Brief Narrative:  78 year old gentleman with history of coronary artery disease status post CABG in 1995, coronary stents July 2025, hypertension, hyperlipidemia, CKD stage IIIb and prostate cancer, sleep apnea not consistent on CPAP came to the emergency room with low-grade fever, chest pain and cough for about 2 days.  In the emergency room he was hemodynamically stable.  Creatinine 2.2 at baseline.  COVID-19 positive.  Lactic acid normal.  Chest x-ray left basal alveolar consolidation.  Subjective: Patient seen and examined.  He has some dry cough.  Denies any other complaints.  Denies any chest pain or shortness of breath.  Currently on room air. Assessment & Plan:   COVID-19 pneumonia: Continue to monitor due to significant symptoms  chest physiotherapy, incentive spirometry, deep breathing exercises, sputum induction, mucolytic's and bronchodilators. Supplemental oxygen to keep saturations more than 90%. Steroids, not indicated.  Patient on room air. Remdesivir , day 2 of 3 today. Antibiotics, suspected superadded bacterial infection.  Patient remains on Rocephin  and doxycycline  that we will continue today. Mobilize with PT OT.  Chest pain in a patient with recent coronary stent: Demand ischemia. Mildly elevated troponin of 27. EKG nonischemic. Acute coronary syndrome ruled out. Continue on aspirin , Plavix , Lopressor , Imdur  and Ranexa .  CKD stage IIIb: At about baseline.  Sleep apnea: Using CPAP at night.  Prefers not to use in the hospital.     DVT prophylaxis: heparin  injection 5,000 Units Start: 01/06/24 0600 SCDs Start: 01/05/24 2306 Place TED hose Start: 01/05/24 2306   Code Status: Full code Family Communication: None at the bedside Disposition Plan: Status is: Inpatient Remains inpatient appropriate because: Significant symptoms, IV antibiotics     Consultants:   None  Procedures:  None  Antimicrobials:  Remdesivir , doxycycline , Rocephin  8/31---     Objective: Vitals:   01/06/24 0008 01/06/24 0305 01/06/24 0636 01/06/24 1028  BP:  (!) 125/59 124/66 (!) 142/67  Pulse:  (!) 59 62 61  Resp: (!) 23 16 18 20   Temp:  98.5 F (36.9 C) 98.3 F (36.8 C) 98 F (36.7 C)  TempSrc:  Oral Oral Oral  SpO2:  95% 98% 98%  Weight:      Height:        Intake/Output Summary (Last 24 hours) at 01/06/2024 1255 Last data filed at 01/06/2024 1233 Gross per 24 hour  Intake 1452.39 ml  Output --  Net 1452.39 ml   Filed Weights   01/05/24 1814 01/05/24 2236  Weight: 92.1 kg 91.6 kg    Examination:  General exam: Appears calm and comfortable.  Pleasant interactive. Respiratory system: Clear to auscultation.  Some conducted upper airway sounds.  On room air. Cardiovascular system: S1 & S2 heard, RRR. No JVD, murmurs, rubs, gallops or clicks. No pedal edema. Gastrointestinal system: Abdomen is nondistended, soft and nontender. No organomegaly or masses felt. Normal bowel sounds heard. Central nervous system: Alert and oriented. No focal neurological deficits. Extremities: Symmetric 5 x 5 power. Skin: No rashes, lesions or ulcers Psychiatry: Judgement and insight appear normal. Mood & affect appropriate.     Data Reviewed: I have personally reviewed following labs and imaging studies  CBC: Recent Labs  Lab 01/05/24 1818 01/06/24 0518  WBC 9.2 8.3  HGB 14.1 13.1  HCT 43.7 41.7  MCV 91.8 95.4  PLT 181 141*   Basic Metabolic Panel: Recent Labs  Lab 01/05/24 1818 01/06/24 0518  NA  137 139  K 4.6 4.4  CL 100 105  CO2 26 24  GLUCOSE 109* 122*  BUN 21 20  CREATININE 2.26* 2.22*  CALCIUM  10.3 9.6   GFR: Estimated Creatinine Clearance: 30.7 mL/min (A) (by C-G formula based on SCr of 2.22 mg/dL (H)). Liver Function Tests: Recent Labs  Lab 01/05/24 1840 01/06/24 0518  AST 33 25  ALT 18 14  ALKPHOS 74 64  BILITOT 0.5 0.3  PROT 7.5  6.2*  ALBUMIN 4.4 3.6   Recent Labs  Lab 01/05/24 1818  LIPASE 23   No results for input(s): AMMONIA in the last 168 hours. Coagulation Profile: No results for input(s): INR, PROTIME in the last 168 hours. Cardiac Enzymes: No results for input(s): CKTOTAL, CKMB, CKMBINDEX, TROPONINI in the last 168 hours. BNP (last 3 results) No results for input(s): PROBNP in the last 8760 hours. HbA1C: No results for input(s): HGBA1C in the last 72 hours. CBG: No results for input(s): GLUCAP in the last 168 hours. Lipid Profile: No results for input(s): CHOL, HDL, LDLCALC, TRIG, CHOLHDL, LDLDIRECT in the last 72 hours. Thyroid  Function Tests: No results for input(s): TSH, T4TOTAL, FREET4, T3FREE, THYROIDAB in the last 72 hours. Anemia Panel: No results for input(s): VITAMINB12, FOLATE, FERRITIN, TIBC, IRON, RETICCTPCT in the last 72 hours. Sepsis Labs: Recent Labs  Lab 01/05/24 1828 01/05/24 2301  PROCALCITON  --  <0.10  LATICACIDVEN 1.4  --     Recent Results (from the past 240 hours)  Resp panel by RT-PCR (RSV, Flu A&B, Covid) Anterior Nasal Swab     Status: Abnormal   Collection Time: 01/05/24  6:19 PM   Specimen: Anterior Nasal Swab  Result Value Ref Range Status   SARS Coronavirus 2 by RT PCR POSITIVE (A) NEGATIVE Final    Comment: (NOTE) SARS-CoV-2 target nucleic acids are DETECTED.  The SARS-CoV-2 RNA is generally detectable in upper respiratory specimens during the acute phase of infection. Positive results are indicative of the presence of the identified virus, but do not rule out bacterial infection or co-infection with other pathogens not detected by the test. Clinical correlation with patient history and other diagnostic information is necessary to determine patient infection status. The expected result is Negative.  Fact Sheet for Patients: BloggerCourse.com  Fact Sheet for Healthcare  Providers: SeriousBroker.it  This test is not yet approved or cleared by the [place]  FDA and  has been authorized for detection and/or diagnosis of SARS-CoV-2 by FDA under an Emergency Use Authorization (EUA).  This EUA will remain in effect (meaning this test can be used) for the duration of  the COVID-19 declaration under Section 564(b)(1) of the A ct, 21 U.S.C. section 360bbb-3(b)(1), unless the authorization is terminated or revoked sooner.     Influenza A by PCR NEGATIVE NEGATIVE Final   Influenza B by PCR NEGATIVE NEGATIVE Final    Comment: (NOTE) The Xpert Xpress SARS-CoV-2/FLU/RSV plus assay is intended as an aid in the diagnosis of influenza from Nasopharyngeal swab specimens and should not be used as a sole basis for treatment. Nasal washings and aspirates are unacceptable for Xpert Xpress SARS-CoV-2/FLU/RSV testing.  Fact Sheet for Patients: BloggerCourse.com  Fact Sheet for Healthcare Providers: SeriousBroker.it  This test is not yet approved or cleared by the [place]  FDA and has been authorized for detection and/or diagnosis of SARS-CoV-2 by FDA under an Emergency Use Authorization (EUA). This EUA will remain in effect (meaning this test can be used) for the duration of the COVID-19 declaration  under Section 564(b)(1) of the Act, 21 U.S.C. section 360bbb-3(b)(1), unless the authorization is terminated or revoked.     Resp Syncytial Virus by PCR NEGATIVE NEGATIVE Final    Comment: (NOTE) Fact Sheet for Patients: BloggerCourse.com  Fact Sheet for Healthcare Providers: SeriousBroker.it  This test is not yet approved or cleared by the [place]  FDA and has been authorized for detection and/or diagnosis of SARS-CoV-2 by FDA under an Emergency Use Authorization (EUA). This EUA will remain in effect (meaning this test can be  used) for the duration of the COVID-19 declaration under Section 564(b)(1) of the Act, 21 U.S.C. section 360bbb-3(b)(1), unless the authorization is terminated or revoked.  Performed at [facility], [address] [place], [place] [address]   Blood culture (routine x 2)     Status: None (Preliminary result)   Collection Time: 01/05/24  6:28 PM   Specimen: BLOOD  Result Value Ref Range Status   Specimen Description   Final    BLOOD LEFT ANTECUBITAL Performed at [facility], [address] [place], [place] [address]    Special Requests   Final    BOTTLES DRAWN AEROBIC AND ANAEROBIC Blood Culture results may not be optimal due to an inadequate volume of blood received in culture bottles Performed at [facility], [address] [place], [place] [address]    Culture   Final    NO GROWTH < 12 HOURS Performed at [facility], [address], [place], [place] [address]    Report Status PENDING  Incomplete         Radiology Studies: ECHOCARDIOGRAM COMPLETE Result Date: 01/06/2024    ECHOCARDIOGRAM REPORT   Patient Name:   Joshua Hahn Date of Exam: 01/06/2024 Medical Rec #:  [id number]       Height:       70.0 in Accession #:    [id number]      Weight:       201.9 lb Date of Birth:  April 25, 1946       BSA:          2.096 m Patient Age:    78 years        BP:           124/66 mmHg Patient Gender: M               HR:           65 bpm. Exam Location:  Inpatient Procedure: 2D Echo, Color Doppler and Cardiac Doppler (Both Spectral and Color            Flow Doppler were utilized during procedure). Indications:    Elevated Troponin  History:        Patient has no prior history of Echocardiogram examinations. CAD                 and Previous Myocardial Infarction; Risk Factors:Hypertension                 and Dyslipidemia.  Sonographer:    Tinnie Gosling RDCS Referring Phys: [phone] SUBRINA SUNDIL IMPRESSIONS  1. Left ventricular ejection fraction, by  estimation, is 50 to 55%. The left ventricle has low normal function. The left ventricle demonstrates regional wall motion abnormalities. Septal hypokinesis. There is moderate asymmetric left ventricular hypertrophy of the basal-septal segment. Left ventricular diastolic parameters are consistent with Grade I diastolic dysfunction (impaired relaxation).  2. Right ventricular systolic function was not  well visualized. The right ventricular size is not well visualized. Tricuspid regurgitation signal is inadequate for assessing PA pressure.  3. Left atrial size was mildly dilated.  4. The mitral valve is normal in structure. Trivial mitral valve regurgitation. No evidence of mitral stenosis.  5. The aortic valve is tricuspid. Aortic valve regurgitation is trivial. No aortic stenosis is present.  6. Aortic dilatation noted. There is dilatation of the ascending aorta, measuring 42 mm. FINDINGS  Left Ventricle: Left ventricular ejection fraction, by estimation, is 50 to 55%. The left ventricle has low normal function. The left ventricle demonstrates regional wall motion abnormalities. The left ventricular internal cavity size was normal in size. There is moderate asymmetric left ventricular hypertrophy of the basal-septal segment. Left ventricular diastolic parameters are consistent with Grade I diastolic dysfunction (impaired relaxation). Right Ventricle: The right ventricular size is not well visualized. Right vetricular wall thickness was not well visualized. Right ventricular systolic function was not well visualized. Tricuspid regurgitation signal is inadequate for assessing PA pressure. Left Atrium: Left atrial size was mildly dilated. Right Atrium: Right atrial size was normal in size. Pericardium: There is no evidence of pericardial effusion. Mitral Valve: The mitral valve is normal in structure. Trivial mitral valve regurgitation. No evidence of mitral valve stenosis. Tricuspid Valve: The tricuspid valve is  normal in structure. Tricuspid valve regurgitation is trivial. Aortic Valve: The aortic valve is tricuspid. Aortic valve regurgitation is trivial. No aortic stenosis is present. Pulmonic Valve: The pulmonic valve was not well visualized. Pulmonic valve regurgitation is trivial. Aorta: The aortic root is normal in size and structure and aortic dilatation noted. There is dilatation of the ascending aorta, measuring 42 mm. IAS/Shunts: The interatrial septum was not well visualized.  LEFT VENTRICLE PLAX 2D LVIDd:         5.50 cm      Diastology LVIDs:         3.60 cm      LV e' medial:    5.87 cm/s LV PW:         1.00 cm      LV E/e' medial:  12.9 LV IVS:        1.00 cm      LV e' lateral:   9.46 cm/s LVOT diam:     2.20 cm      LV E/e' lateral: 8.0 LV SV:         71 LV SV Index:   34 LVOT Area:     3.80 cm  LV Volumes (MOD) LV vol d, MOD A2C: 120.0 ml LV vol d, MOD A4C: 137.0 ml LV vol s, MOD A2C: 47.7 ml LV vol s, MOD A4C: 65.3 ml LV SV MOD A2C:     72.3 ml LV SV MOD A4C:     137.0 ml LV SV MOD BP:      71.7 ml RIGHT VENTRICLE RV S prime:     10.90 cm/s TAPSE (M-mode): 1.7 cm LEFT ATRIUM             Index        RIGHT ATRIUM           Index LA diam:        4.60 cm 2.19 cm/m   RA Area:     13.10 cm LA Vol (A2C):   58.0 ml 27.67 ml/m  RA Volume:   26.30 ml  12.55 ml/m LA Vol (A4C):   74.7 ml 35.64 ml/m LA Biplane Vol: 71.4 ml 34.07  ml/m  AORTIC VALVE LVOT Vmax:   101.00 cm/s LVOT Vmean:  55.000 cm/s LVOT VTI:    0.187 m  AORTA Ao Root diam: 3.50 cm Ao Asc diam:  4.20 cm MITRAL VALVE MV Area (PHT): 2.26 cm    SHUNTS MV Decel Time: 335 msec    Systemic VTI:  0.19 m MV E velocity: 75.90 cm/s  Systemic Diam: 2.20 cm MV A velocity: 94.90 cm/s MV E/A ratio:  0.80 Lonni Nanas MD Electronically signed by Lonni Nanas MD Signature Date/Time: 01/06/2024/12:10:25 PM    Final    DG Chest Port 1 View Result Date: 01/05/2024 CLINICAL DATA:  Cough and fever EXAM: PORTABLE CHEST 1 VIEW COMPARISON:  08/16/2023.  FINDINGS: Enlarged cardiac silhouette. Status post median sternotomy and CABG. Calcified aorta. No pneumothorax or pleural effusion. Alveolar opacity in the left lung base consistent with pneumonia. IMPRESSION: Left base alveolar consolidation consistent with pneumonia. Electronically Signed   By: Fonda Field M.D.   On: 01/05/2024 18:40        Scheduled Meds:  amLODipine   7.5 mg Oral Daily   ascorbic acid   250 mg Oral Daily   aspirin   81 mg Oral Daily   cholecalciferol   1,000 Units Oral Daily   clopidogrel   75 mg Oral Daily   guaiFENesin   600 mg Oral BID   heparin   5,000 Units Subcutaneous Q8H   isosorbide  mononitrate  120 mg Oral Daily   metoprolol  tartrate  25 mg Oral BID   ranolazine   1,000 mg Oral BID   rosuvastatin   40 mg Oral Daily   sodium chloride  flush  3 mL Intravenous Q12H   sodium chloride  flush  3 mL Intravenous Q12H   Continuous Infusions:  sodium chloride      cefTRIAXone  (ROCEPHIN )  IV Stopped (01/06/24 1115)   doxycycline  (VIBRAMYCIN ) IV 100 mg (01/06/24 1159)   lactated ringers  Stopped (01/06/24 1030)   remdesivir  100 mg in sodium chloride  0.9 % 100 mL IVPB Stopped (01/06/24 1000)     LOS: 0 days    Time spent: 52 minutes    Renato Applebaum, MD [facility]

## 2024-01-06 NOTE — TOC Initial Note (Signed)
 Transition of Care [facility]) - Initial/Assessment Note   Patient Details  Name: Joshua Hahn MRN: [id number] Date of Birth: 1945-08-03  Transition of [facility]) CM/SW Contact:    Duwaine GORMAN Aran, LCSW Phone Number: 01/06/2024, 1:44 PM  Clinical Narrative: Patient is from home with spouse. Patient is currently requiring IV antibiotics. Care management following for possible discharge needs.  Expected Discharge Plan: Home/Self Care Barriers to Discharge: Continued Medical Work up  Patient Goals and CMS Choice Patient states their goals for this hospitalization and ongoing recovery are:: Home  Expected Discharge Plan and Services In-house Referral: Clinical Social Work Living arrangements for the past 2 months: Single Family Home              DME Arranged: N/A DME Agency: NA  Prior Living Arrangements/Services Living arrangements for the past 2 months: Single Family Home Lives with:: Spouse Patient language and need for interpreter reviewed:: Yes Do you feel safe going back to the place where you live?: Yes      Need for Family Participation in Patient Care: No (Comment) Care giver support system in place?: Yes (comment) Criminal Activity/Legal Involvement Pertinent to Current Situation/Hospitalization: No - Comment as needed  Activities of Daily Living ADL Screening (condition at time of admission) Independently performs ADLs?: Yes (appropriate for developmental age) Is the patient deaf or have difficulty hearing?: No Does the patient have difficulty seeing, even when wearing glasses/contacts?: No Does the patient have difficulty concentrating, remembering, or making decisions?: No  Emotional Assessment Orientation: : Oriented to Self, Oriented to Place, Oriented to  Time, Oriented to Situation Alcohol / Substance Use: Not Applicable Psych Involvement: No (comment)  Admission diagnosis:  Community acquired pneumonia of left lower lobe of lung [J18.9] COVID-19 virus infection  [U07.1] Pneumonia due to COVID-19 virus [U07.1, J12.82] Patient Active Problem List   Diagnosis Date Noted   Pneumonia due to COVID-19 virus 01/05/2024   CAP (community acquired pneumonia) 01/05/2024   Acute kidney injury superimposed on chronic kidney disease (HCC) 01/05/2024   History of CAD (coronary artery disease) 01/05/2024   History of prostate cancer 01/05/2024   Elevated troponin-secondary to demand ischemia 01/05/2024   Angina of effort (HCC) 09/20/2023   Shoulder arthritis 02/21/2023   Knee effusion, left 03/23/2020   History of adenomatous polyp of colon 12/10/2014   Anosmia 09/28/2014   Chronic renal insufficiency, stage III (moderate) (HCC) 02/05/2013   CAD - PCI 1980s, CABG X 4 1994, DES CFX 08/2008, stent 2014 nstemi 01/11/2013   Old MI (myocardial infarction) 01/11/2013   OSA on CPAP 01/11/2013   ADENOCARCINOMA, PROSTATE, GLEASON GRADE 7 04/05/2009   Hyperlipidemia 12/18/2006   Essential hypertension 12/18/2006   PCP:  Pcp, No Pharmacy:   [id number] [address] [place], [place] - [address] [place] [address] Phone: [phone] Fax: [phone]  Social Drivers of Health (SDOH) Social History: SDOH Screenings   Food Insecurity: No Food Insecurity (01/05/2024)  Housing: Low Risk  (01/05/2024)  Transportation Needs: No Transportation Needs (01/05/2024)  Utilities: Not At Risk (01/05/2024)  Depression (PHQ2-9): Low Risk  (10/17/2023)  Financial Resource Strain: Low Risk  (04/08/2023)  Physical Activity: Insufficiently Active (04/08/2023)  Social Connections: Socially Integrated (01/05/2024)  Stress: No Stress Concern Present (04/08/2023)  Tobacco Use: Medium Risk (01/05/2024)  Health Literacy: Adequate Health Literacy (04/11/2023)   SDOH Interventions:    Readmission Risk Interventions     No data to display

## 2024-01-07 DIAGNOSIS — J1282 Pneumonia due to coronavirus disease 2019: Secondary | ICD-10-CM | POA: Diagnosis not present

## 2024-01-07 DIAGNOSIS — U071 COVID-19: Secondary | ICD-10-CM | POA: Diagnosis not present

## 2024-01-07 MED ORDER — EZETIMIBE 10 MG PO TABS
10.0000 mg | ORAL_TABLET | Freq: Every day | ORAL | Status: DC
  Administered 2024-01-07: 10 mg via ORAL
  Filled 2024-01-07: qty 1

## 2024-01-07 MED ORDER — OMEGA-3-ACID ETHYL ESTERS 1 G PO CAPS
1.0000 g | ORAL_CAPSULE | Freq: Two times a day (BID) | ORAL | Status: DC
  Administered 2024-01-07 – 2024-01-08 (×2): 1 g via ORAL
  Filled 2024-01-07 (×2): qty 1

## 2024-01-07 NOTE — Progress Notes (Signed)
 PROGRESS NOTE    Joshua Hahn  FMW:[id number] DOB: 08-Feb-1946 DOA: 01/05/2024 PCP: Freddrick, No    Brief Narrative:  78 year old gentleman with history of coronary artery disease status post CABG in 1995, coronary stents July 2025, hypertension, hyperlipidemia, CKD stage IIIb and prostate cancer, sleep apnea not consistent on CPAP came to the emergency room with low-grade fever, chest pain and cough for about 2 days.  In the emergency room he was hemodynamically stable.  Creatinine 2.2 at baseline.  COVID-19 positive.  Lactic acid normal.  Chest x-ray left basal alveolar consolidation.  Subjective: Patient was seen and examined.  Dry cough present.  Afebrile.  Wife at the bedside.  Patient stated doing much improvement, however he still gets out of breath with mobility.  Not using oxygen.    Assessment & Plan:   COVID-19 pneumonia: Continue to monitor due to significant symptoms  chest physiotherapy, incentive spirometry, deep breathing exercises, sputum induction, mucolytic's and bronchodilators. Supplemental oxygen to keep saturations more than 90%. Steroids, not indicated.  Patient on room air. Remdesivir , day 3 of 3 today. Antibiotics, suspected superadded bacterial infection.  Patient remains on Rocephin  and doxycycline  that we will continue today. Mobilize with PT OT. Anticipate home tomorrow.  Chest pain in a patient with recent coronary stent: Demand ischemia. Mildly elevated troponin of 27. EKG nonischemic. Acute coronary syndrome ruled out. Continue on aspirin , Plavix , Lopressor , Imdur  and Ranexa .  CKD stage IIIb: At about baseline.  Sleep apnea: Using CPAP at night.  Prefers not to use in the hospital.     DVT prophylaxis: heparin  injection 5,000 Units Start: 01/06/24 0600 SCDs Start: 01/05/24 2306 Place TED hose Start: 01/05/24 2306   Code Status: Full code Family Communication: Wife at the bedside. Disposition Plan: Status is: Inpatient Remains inpatient  appropriate because: Significant symptoms, IV antibiotics     Consultants:  None  Procedures:  None  Antimicrobials:  Remdesivir , doxycycline , Rocephin  8/31---     Objective: Vitals:   01/06/24 2039 01/07/24 0456 01/07/24 0924 01/07/24 1240  BP: 119/73 131/69 134/68 130/68  Pulse: (!) 56 (!) 53 (!) 52 (!) 56  Resp: 20 (!) 24  20  Temp: 98.1 F (36.7 C) 97.8 F (36.6 C)  97.7 F (36.5 C)  TempSrc: Oral Oral  Oral  SpO2: 98% 99%  99%  Weight:      Height:        Intake/Output Summary (Last 24 hours) at 01/07/2024 1318 Last data filed at 01/07/2024 1240 Gross per 24 hour  Intake 2434.13 ml  Output 1900 ml  Net 534.13 ml   Filed Weights   01/05/24 1814 01/05/24 2236  Weight: 92.1 kg 91.6 kg    Examination:  General exam: Looks fairly comfortable.  slightly anxious. Respiratory system: Clear to auscultation.  Some conducted upper airway sounds.  On room air. Cardiovascular system: S1 & S2 heard, RRR. No JVD, murmurs, rubs, gallops or clicks. No pedal edema. Gastrointestinal system: Abdomen is nondistended, soft and nontender. No organomegaly or masses felt. Normal bowel sounds heard. Central nervous system: Alert and oriented. No focal neurological deficits. Extremities: Symmetric 5 x 5 power. Skin: No rashes, lesions or ulcers Psychiatry: Judgement and insight appear normal. Mood & affect appropriate.     Data Reviewed: I have personally reviewed following labs and imaging studies  CBC: Recent Labs  Lab 01/05/24 1818 01/06/24 0518  WBC 9.2 8.3  HGB 14.1 13.1  HCT 43.7 41.7  MCV 91.8 95.4  PLT 181 141*  Basic Metabolic Panel: Recent Labs  Lab 01/05/24 1818 01/06/24 0518  NA 137 139  K 4.6 4.4  CL 100 105  CO2 26 24  GLUCOSE 109* 122*  BUN 21 20  CREATININE 2.26* 2.22*  CALCIUM  10.3 9.6   GFR: Estimated Creatinine Clearance: 30.7 mL/min (A) (by C-G formula based on SCr of 2.22 mg/dL (H)). Liver Function Tests: Recent Labs  Lab  01/05/24 1840 01/06/24 0518  AST 33 25  ALT 18 14  ALKPHOS 74 64  BILITOT 0.5 0.3  PROT 7.5 6.2*  ALBUMIN 4.4 3.6   Recent Labs  Lab 01/05/24 1818  LIPASE 23   No results for input(s): AMMONIA in the last 168 hours. Coagulation Profile: No results for input(s): INR, PROTIME in the last 168 hours. Cardiac Enzymes: No results for input(s): CKTOTAL, CKMB, CKMBINDEX, TROPONINI in the last 168 hours. BNP (last 3 results) No results for input(s): PROBNP in the last 8760 hours. HbA1C: No results for input(s): HGBA1C in the last 72 hours. CBG: No results for input(s): GLUCAP in the last 168 hours. Lipid Profile: No results for input(s): CHOL, HDL, LDLCALC, TRIG, CHOLHDL, LDLDIRECT in the last 72 hours. Thyroid  Function Tests: No results for input(s): TSH, T4TOTAL, FREET4, T3FREE, THYROIDAB in the last 72 hours. Anemia Panel: No results for input(s): VITAMINB12, FOLATE, FERRITIN, TIBC, IRON, RETICCTPCT in the last 72 hours. Sepsis Labs: Recent Labs  Lab 01/05/24 1828 01/05/24 2301  PROCALCITON  --  <0.10  LATICACIDVEN 1.4  --     Recent Results (from the past 240 hours)  Resp panel by RT-PCR (RSV, Flu A&B, Covid) Anterior Nasal Swab     Status: Abnormal   Collection Time: 01/05/24  6:19 PM   Specimen: Anterior Nasal Swab  Result Value Ref Range Status   SARS Coronavirus 2 by RT PCR POSITIVE (A) NEGATIVE Final    Comment: (NOTE) SARS-CoV-2 target nucleic acids are DETECTED.  The SARS-CoV-2 RNA is generally detectable in upper respiratory specimens during the acute phase of infection. Positive results are indicative of the presence of the identified virus, but do not rule out bacterial infection or co-infection with other pathogens not detected by the test. Clinical correlation with patient history and other diagnostic information is necessary to determine patient infection status. The expected result is  Negative.  Fact Sheet for Patients: BloggerCourse.com  Fact Sheet for Healthcare Providers: SeriousBroker.it  This test is not yet approved or cleared by the [place]  FDA and  has been authorized for detection and/or diagnosis of SARS-CoV-2 by FDA under an Emergency Use Authorization (EUA).  This EUA will remain in effect (meaning this test can be used) for the duration of  the COVID-19 declaration under Section 564(b)(1) of the A ct, 21 U.S.C. section 360bbb-3(b)(1), unless the authorization is terminated or revoked sooner.     Influenza A by PCR NEGATIVE NEGATIVE Final   Influenza B by PCR NEGATIVE NEGATIVE Final    Comment: (NOTE) The Xpert Xpress SARS-CoV-2/FLU/RSV plus assay is intended as an aid in the diagnosis of influenza from Nasopharyngeal swab specimens and should not be used as a sole basis for treatment. Nasal washings and aspirates are unacceptable for Xpert Xpress SARS-CoV-2/FLU/RSV testing.  Fact Sheet for Patients: BloggerCourse.com  Fact Sheet for Healthcare Providers: SeriousBroker.it  This test is not yet approved or cleared by the [place]  FDA and has been authorized for detection and/or diagnosis of SARS-CoV-2 by FDA under an Emergency Use Authorization (EUA). This EUA will remain in effect (  meaning this test can be used) for the duration of the COVID-19 declaration under Section 564(b)(1) of the Act, 21 U.S.C. section 360bbb-3(b)(1), unless the authorization is terminated or revoked.     Resp Syncytial Virus by PCR NEGATIVE NEGATIVE Final    Comment: (NOTE) Fact Sheet for Patients: BloggerCourse.com  Fact Sheet for Healthcare Providers: SeriousBroker.it  This test is not yet approved or cleared by the [place]  FDA and has been authorized for detection and/or diagnosis of SARS-CoV-2  by FDA under an Emergency Use Authorization (EUA). This EUA will remain in effect (meaning this test can be used) for the duration of the COVID-19 declaration under Section 564(b)(1) of the Act, 21 U.S.C. section 360bbb-3(b)(1), unless the authorization is terminated or revoked.  Performed at [facility], [address] [place], [place] [address]   Blood culture (routine x 2)     Status: None (Preliminary result)   Collection Time: 01/05/24  6:28 PM   Specimen: BLOOD  Result Value Ref Range Status   Specimen Description   Final    BLOOD LEFT ANTECUBITAL Performed at [facility], [address] [place], [place] [address]    Special Requests   Final    BOTTLES DRAWN AEROBIC AND ANAEROBIC Blood Culture results may not be optimal due to an inadequate volume of blood received in culture bottles Performed at [facility] [address] [place], [place] [address]    Culture   Final    NO GROWTH 2 DAYS Performed at [facility], [address], [place], [place] [address]    Report Status PENDING  Incomplete  Blood culture (routine x 2)     Status: None (Preliminary result)   Collection Time: 01/05/24  6:53 PM   Specimen: BLOOD  Result Value Ref Range Status   Specimen Description   Final    BLOOD RIGHT ANTECUBITAL Performed at [facility], [address] [place], [place] [address]    Special Requests   Final    BOTTLES DRAWN AEROBIC AND ANAEROBIC Blood Culture adequate volume Performed at [facility], [address] [place], [place] [address]    Culture   Final    NO GROWTH < 24 HOURS Performed at [facility], [address], [place], [place] [address]    Report Status PENDING  Incomplete         Radiology Studies: ECHOCARDIOGRAM COMPLETE Result Date: 01/06/2024    ECHOCARDIOGRAM REPORT   Patient Name:   FUMIO VANDAM Date of Exam: 01/06/2024 Medical Rec #:  [id number]       Height:       70.0 in  Accession #:    [id number]      Weight:       201.9 lb Date of Birth:  May 10, 1945       BSA:          2.096 m Patient Age:    78 years        BP:           124/66 mmHg Patient Gender: M               HR:           65 bpm. Exam Location:  Inpatient Procedure: 2D Echo, Color Doppler and Cardiac Doppler (Both Spectral and Color            Flow Doppler were utilized  during procedure). Indications:    Elevated Troponin  History:        Patient has no prior history of Echocardiogram examinations. CAD                 and Previous Myocardial Infarction; Risk Factors:Hypertension                 and Dyslipidemia.  Sonographer:    Tinnie Gosling RDCS Referring Phys: [phone] SUBRINA SUNDIL IMPRESSIONS  1. Left ventricular ejection fraction, by estimation, is 50 to 55%. The left ventricle has low normal function. The left ventricle demonstrates regional wall motion abnormalities. Septal hypokinesis. There is moderate asymmetric left ventricular hypertrophy of the basal-septal segment. Left ventricular diastolic parameters are consistent with Grade I diastolic dysfunction (impaired relaxation).  2. Right ventricular systolic function was not well visualized. The right ventricular size is not well visualized. Tricuspid regurgitation signal is inadequate for assessing PA pressure.  3. Left atrial size was mildly dilated.  4. The mitral valve is normal in structure. Trivial mitral valve regurgitation. No evidence of mitral stenosis.  5. The aortic valve is tricuspid. Aortic valve regurgitation is trivial. No aortic stenosis is present.  6. Aortic dilatation noted. There is dilatation of the ascending aorta, measuring 42 mm. FINDINGS  Left Ventricle: Left ventricular ejection fraction, by estimation, is 50 to 55%. The left ventricle has low normal function. The left ventricle demonstrates regional wall motion abnormalities. The left ventricular internal cavity size was normal in size. There is moderate asymmetric left ventricular  hypertrophy of the basal-septal segment. Left ventricular diastolic parameters are consistent with Grade I diastolic dysfunction (impaired relaxation). Right Ventricle: The right ventricular size is not well visualized. Right vetricular wall thickness was not well visualized. Right ventricular systolic function was not well visualized. Tricuspid regurgitation signal is inadequate for assessing PA pressure. Left Atrium: Left atrial size was mildly dilated. Right Atrium: Right atrial size was normal in size. Pericardium: There is no evidence of pericardial effusion. Mitral Valve: The mitral valve is normal in structure. Trivial mitral valve regurgitation. No evidence of mitral valve stenosis. Tricuspid Valve: The tricuspid valve is normal in structure. Tricuspid valve regurgitation is trivial. Aortic Valve: The aortic valve is tricuspid. Aortic valve regurgitation is trivial. No aortic stenosis is present. Pulmonic Valve: The pulmonic valve was not well visualized. Pulmonic valve regurgitation is trivial. Aorta: The aortic root is normal in size and structure and aortic dilatation noted. There is dilatation of the ascending aorta, measuring 42 mm. IAS/Shunts: The interatrial septum was not well visualized.  LEFT VENTRICLE PLAX 2D LVIDd:         5.50 cm      Diastology LVIDs:         3.60 cm      LV e' medial:    5.87 cm/s LV PW:         1.00 cm      LV E/e' medial:  12.9 LV IVS:        1.00 cm      LV e' lateral:   9.46 cm/s LVOT diam:     2.20 cm      LV E/e' lateral: 8.0 LV SV:         71 LV SV Index:   34 LVOT Area:     3.80 cm  LV Volumes (MOD) LV vol d, MOD A2C: 120.0 ml LV vol d, MOD A4C: 137.0 ml LV vol s, MOD A2C: 47.7 ml LV vol s, MOD  A4C: 65.3 ml LV SV MOD A2C:     72.3 ml LV SV MOD A4C:     137.0 ml LV SV MOD BP:      71.7 ml RIGHT VENTRICLE RV S prime:     10.90 cm/s TAPSE (M-mode): 1.7 cm LEFT ATRIUM             Index        RIGHT ATRIUM           Index LA diam:        4.60 cm 2.19 cm/m   RA Area:      13.10 cm LA Vol (A2C):   58.0 ml 27.67 ml/m  RA Volume:   26.30 ml  12.55 ml/m LA Vol (A4C):   74.7 ml 35.64 ml/m LA Biplane Vol: 71.4 ml 34.07 ml/m  AORTIC VALVE LVOT Vmax:   101.00 cm/s LVOT Vmean:  55.000 cm/s LVOT VTI:    0.187 m  AORTA Ao Root diam: 3.50 cm Ao Asc diam:  4.20 cm MITRAL VALVE MV Area (PHT): 2.26 cm    SHUNTS MV Decel Time: 335 msec    Systemic VTI:  0.19 m MV E velocity: 75.90 cm/s  Systemic Diam: 2.20 cm MV A velocity: 94.90 cm/s MV E/A ratio:  0.80 Lonni Nanas MD Electronically signed by Lonni Nanas MD Signature Date/Time: 01/06/2024/12:10:25 PM    Final    DG Chest Port 1 View Result Date: 01/05/2024 CLINICAL DATA:  Cough and fever EXAM: PORTABLE CHEST 1 VIEW COMPARISON:  08/16/2023. FINDINGS: Enlarged cardiac silhouette. Status post median sternotomy and CABG. Calcified aorta. No pneumothorax or pleural effusion. Alveolar opacity in the left lung base consistent with pneumonia. IMPRESSION: Left base alveolar consolidation consistent with pneumonia. Electronically Signed   By: Fonda Field M.D.   On: 01/05/2024 18:40        Scheduled Meds:  amLODipine   7.5 mg Oral Daily   ascorbic acid   250 mg Oral Daily   aspirin   81 mg Oral Daily   cholecalciferol   1,000 Units Oral Daily   clopidogrel   75 mg Oral Daily   guaiFENesin   600 mg Oral BID   heparin   5,000 Units Subcutaneous Q8H   isosorbide  mononitrate  120 mg Oral Daily   metoprolol  tartrate  25 mg Oral BID   ranolazine   1,000 mg Oral BID   rosuvastatin   40 mg Oral Daily   sodium chloride  flush  3 mL Intravenous Q12H   sodium chloride  flush  3 mL Intravenous Q12H   Continuous Infusions:  cefTRIAXone  (ROCEPHIN )  IV Stopped (01/07/24 0826)   doxycycline  (VIBRAMYCIN ) IV Stopped (01/07/24 1229)     LOS: 1 day    Time spent: 52 minutes    Renato Applebaum, MD [facility]

## 2024-01-08 ENCOUNTER — Encounter (HOSPITAL_COMMUNITY)

## 2024-01-08 DIAGNOSIS — U071 COVID-19: Secondary | ICD-10-CM | POA: Diagnosis not present

## 2024-01-08 DIAGNOSIS — J1282 Pneumonia due to coronavirus disease 2019: Secondary | ICD-10-CM | POA: Diagnosis not present

## 2024-01-08 LAB — LEGIONELLA PNEUMOPHILA SEROGP 1 UR AG: L. pneumophila Serogp 1 Ur Ag: NEGATIVE

## 2024-01-08 MED ORDER — METOPROLOL TARTRATE 25 MG PO TABS: 25.0000 mg | ORAL_TABLET | Freq: Every day | ORAL | Status: AC

## 2024-01-08 MED ORDER — DOXYCYCLINE HYCLATE 100 MG PO CAPS: 100.0000 mg | ORAL_CAPSULE | Freq: Two times a day (BID) | ORAL | 0 refills | Status: DC

## 2024-01-08 NOTE — Discharge Summary (Signed)
 Physician Discharge Summary  Joshua Hahn FMW:[id number] DOB: 08-07-1945 DOA: 01/05/2024  PCP: Katrinka  Admit date: 01/05/2024 Discharge date: 01/08/2024  Admitted From:  Discharge disposition: Home   Recommendations for Outpatient Follow-Up:      Discharge Diagnosis:   Principal Problem:   Pneumonia due to COVID-19 virus Active Problems:   CAP (community acquired pneumonia)   Hyperlipidemia   Essential hypertension   OSA on CPAP   Acute kidney injury superimposed on chronic kidney disease (HCC)   History of CAD (coronary artery disease)   History of prostate cancer   Elevated troponin-secondary to demand ischemia    Discharge Condition: Improved.  Diet recommendation: Low sodium, heart healthy Wound care: None.  Code status: Full.   History of Present Illness:   Joshua Hahn is a 78 y.o. male with medical history significant of CAD s/p CABG in 09/2023, hypertension, hyperlipidemia, CKD stage IIIb, prostate cancer and sleep apnea presented with complaints of cough, shortness of breath, chest pain with coughing, and fever x 1 to 2 days.   At presentation to ED patient is hemodynamically stable and afebrile. Not hypoxic but placed on 2 L  due to tachypnea.  EKG without STEMI.  Labs showing no leukocytosis, creatinine 2.2 (previously 1.7-2.0), initial troponin 27 and repeat pending, SARS-CoV-2 PCR positive, lactic acid normal.  Chest x-ray showing left base alveolar consolidation consistent with pneumonia.  Patient was given Tylenol , remdesivir , Augmentin , azithromycin , Zofran , and 1 L normal saline.    Patient has been transferred to [facility] for management of COVID-19 pneumonia, AKI on CKD stage IIIb and elevated troponin secondary to demand ischemia.   During my evaluation at the bedside patient is resting comfortably in the bed.  Having productive cough.  Denies any chest pain, shortness of breath, palpitation, fever, nausea, vomiting, and  diarrhea.  No known sick contact.       Hospital Course by Problem:   COVID-19 pneumonia: Steroids, not indicated.  Patient on room air. Remdesivir  Antibiotics, suspected  bacterial infection-- will finish treatment   Chest pain in a patient with recent coronary stent: Demand ischemia. Mildly elevated troponin of 27. EKG nonischemic. Acute coronary syndrome ruled out. Continue on aspirin , Plavix , Lopressor , Imdur  and Ranexa .   CKD stage IIIb:  -At about baseline.   Sleep apnea:  -CPAP at night    Medical Consultants:      Discharge Exam:   Vitals:   01/07/24 2136 01/08/24 0519  BP: 137/60 121/65  Pulse: (!) 52 (!) 53  Resp: 18 16  Temp: 97.9 F (36.6 C) 98.1 F (36.7 C)  SpO2: 98% 99%   Vitals:   01/07/24 0924 01/07/24 1240 01/07/24 2136 01/08/24 0519  BP: 134/68 130/68 137/60 121/65  Pulse: (!) 52 (!) 56 (!) 52 (!) 53  Resp:  20 18 16   Temp:  97.7 F (36.5 C) 97.9 F (36.6 C) 98.1 F (36.7 C)  TempSrc:  Oral Oral Oral  SpO2:  99% 98% 99%  Weight:      Height:        General exam: Appears calm and comfortable   The results of significant diagnostics from this hospitalization (including imaging, microbiology, ancillary and laboratory) are listed below for reference.     Procedures and Diagnostic Studies:   ECHOCARDIOGRAM COMPLETE Result Date: 01/06/2024    ECHOCARDIOGRAM REPORT   Patient Name:   Joshua Hahn Date of Exam: 01/06/2024 Medical Rec #:  [id number]  Height:       70.0 in Accession #:    [id number]      Weight:       201.9 lb Date of Birth:  15-Jan-1946       BSA:          2.096 m Patient Age:    78 years        BP:           124/66 mmHg Patient Gender: M               HR:           65 bpm. Exam Location:  Inpatient Procedure: 2D Echo, Color Doppler and Cardiac Doppler (Both Spectral and Color            Flow Doppler were utilized during procedure). Indications:    Elevated Troponin  History:        Patient has no prior history of  Echocardiogram examinations. CAD                 and Previous Myocardial Infarction; Risk Factors:Hypertension                 and Dyslipidemia.  Sonographer:    Tinnie Gosling RDCS Referring Phys: [phone] SUBRINA SUNDIL IMPRESSIONS  1. Left ventricular ejection fraction, by estimation, is 50 to 55%. The left ventricle has low normal function. The left ventricle demonstrates regional wall motion abnormalities. Septal hypokinesis. There is moderate asymmetric left ventricular hypertrophy of the basal-septal segment. Left ventricular diastolic parameters are consistent with Grade I diastolic dysfunction (impaired relaxation).  2. Right ventricular systolic function was not well visualized. The right ventricular size is not well visualized. Tricuspid regurgitation signal is inadequate for assessing PA pressure.  3. Left atrial size was mildly dilated.  4. The mitral valve is normal in structure. Trivial mitral valve regurgitation. No evidence of mitral stenosis.  5. The aortic valve is tricuspid. Aortic valve regurgitation is trivial. No aortic stenosis is present.  6. Aortic dilatation noted. There is dilatation of the ascending aorta, measuring 42 mm. FINDINGS  Left Ventricle: Left ventricular ejection fraction, by estimation, is 50 to 55%. The left ventricle has low normal function. The left ventricle demonstrates regional wall motion abnormalities. The left ventricular internal cavity size was normal in size. There is moderate asymmetric left ventricular hypertrophy of the basal-septal segment. Left ventricular diastolic parameters are consistent with Grade I diastolic dysfunction (impaired relaxation). Right Ventricle: The right ventricular size is not well visualized. Right vetricular wall thickness was not well visualized. Right ventricular systolic function was not well visualized. Tricuspid regurgitation signal is inadequate for assessing PA pressure. Left Atrium: Left atrial size was mildly dilated. Right  Atrium: Right atrial size was normal in size. Pericardium: There is no evidence of pericardial effusion. Mitral Valve: The mitral valve is normal in structure. Trivial mitral valve regurgitation. No evidence of mitral valve stenosis. Tricuspid Valve: The tricuspid valve is normal in structure. Tricuspid valve regurgitation is trivial. Aortic Valve: The aortic valve is tricuspid. Aortic valve regurgitation is trivial. No aortic stenosis is present. Pulmonic Valve: The pulmonic valve was not well visualized. Pulmonic valve regurgitation is trivial. Aorta: The aortic root is normal in size and structure and aortic dilatation noted. There is dilatation of the ascending aorta, measuring 42 mm. IAS/Shunts: The interatrial septum was not well visualized.  LEFT VENTRICLE PLAX 2D LVIDd:         5.50 cm  Diastology LVIDs:         3.60 cm      LV e' medial:    5.87 cm/s LV PW:         1.00 cm      LV E/e' medial:  12.9 LV IVS:        1.00 cm      LV e' lateral:   9.46 cm/s LVOT diam:     2.20 cm      LV E/e' lateral: 8.0 LV SV:         71 LV SV Index:   34 LVOT Area:     3.80 cm  LV Volumes (MOD) LV vol d, MOD A2C: 120.0 ml LV vol d, MOD A4C: 137.0 ml LV vol s, MOD A2C: 47.7 ml LV vol s, MOD A4C: 65.3 ml LV SV MOD A2C:     72.3 ml LV SV MOD A4C:     137.0 ml LV SV MOD BP:      71.7 ml RIGHT VENTRICLE RV S prime:     10.90 cm/s TAPSE (M-mode): 1.7 cm LEFT ATRIUM             Index        RIGHT ATRIUM           Index LA diam:        4.60 cm 2.19 cm/m   RA Area:     13.10 cm LA Vol (A2C):   58.0 ml 27.67 ml/m  RA Volume:   26.30 ml  12.55 ml/m LA Vol (A4C):   74.7 ml 35.64 ml/m LA Biplane Vol: 71.4 ml 34.07 ml/m  AORTIC VALVE LVOT Vmax:   101.00 cm/s LVOT Vmean:  55.000 cm/s LVOT VTI:    0.187 m  AORTA Ao Root diam: 3.50 cm Ao Asc diam:  4.20 cm MITRAL VALVE MV Area (PHT): 2.26 cm    SHUNTS MV Decel Time: 335 msec    Systemic VTI:  0.19 m MV E velocity: 75.90 cm/s  Systemic Diam: 2.20 cm MV A velocity: 94.90 cm/s MV  E/A ratio:  0.80 Lonni Nanas MD Electronically signed by Lonni Nanas MD Signature Date/Time: 01/06/2024/12:10:25 PM    Final    DG Chest Port 1 View Result Date: 01/05/2024 CLINICAL DATA:  Cough and fever EXAM: PORTABLE CHEST 1 VIEW COMPARISON:  08/16/2023. FINDINGS: Enlarged cardiac silhouette. Status post median sternotomy and CABG. Calcified aorta. No pneumothorax or pleural effusion. Alveolar opacity in the left lung base consistent with pneumonia. IMPRESSION: Left base alveolar consolidation consistent with pneumonia. Electronically Signed   By: Fonda Field M.D.   On: 01/05/2024 18:40     Labs:   Basic Metabolic Panel: Recent Labs  Lab 01/05/24 1818 01/06/24 0518  NA 137 139  K 4.6 4.4  CL 100 105  CO2 26 24  GLUCOSE 109* 122*  BUN 21 20  CREATININE 2.26* 2.22*  CALCIUM  10.3 9.6   GFR Estimated Creatinine Clearance: 30.7 mL/min (A) (by C-G formula based on SCr of 2.22 mg/dL (H)). Liver Function Tests: Recent Labs  Lab 01/05/24 1840 01/06/24 0518  AST 33 25  ALT 18 14  ALKPHOS 74 64  BILITOT 0.5 0.3  PROT 7.5 6.2*  ALBUMIN 4.4 3.6   Recent Labs  Lab 01/05/24 1818  LIPASE 23   No results for input(s): AMMONIA in the last 168 hours. Coagulation profile No results for input(s): INR, PROTIME in the last 168 hours.  CBC: Recent Labs  Lab 01/05/24  1818 01/06/24 0518  WBC 9.2 8.3  HGB 14.1 13.1  HCT 43.7 41.7  MCV 91.8 95.4  PLT 181 141*   Cardiac Enzymes: No results for input(s): CKTOTAL, CKMB, CKMBINDEX, TROPONINI in the last 168 hours. BNP: Invalid input(s): POCBNP CBG: No results for input(s): GLUCAP in the last 168 hours. D-Dimer No results for input(s): DDIMER in the last 72 hours. Hgb A1c No results for input(s): HGBA1C in the last 72 hours. Lipid Profile No results for input(s): CHOL, HDL, LDLCALC, TRIG, CHOLHDL, LDLDIRECT in the last 72 hours. Thyroid  function studies No results for  input(s): TSH, T4TOTAL, T3FREE, THYROIDAB in the last 72 hours.  Invalid input(s): FREET3 Anemia work up No results for input(s): VITAMINB12, FOLATE, FERRITIN, TIBC, IRON, RETICCTPCT in the last 72 hours. Microbiology Recent Results (from the past 240 hours)  Resp panel by RT-PCR (RSV, Flu A&B, Covid) Anterior Nasal Swab     Status: Abnormal   Collection Time: 01/05/24  6:19 PM   Specimen: Anterior Nasal Swab  Result Value Ref Range Status   SARS Coronavirus 2 by RT PCR POSITIVE (A) NEGATIVE Final    Comment: (NOTE) SARS-CoV-2 target nucleic acids are DETECTED.  The SARS-CoV-2 RNA is generally detectable in upper respiratory specimens during the acute phase of infection. Positive results are indicative of the presence of the identified virus, but do not rule out bacterial infection or co-infection with other pathogens not detected by the test. Clinical correlation with patient history and other diagnostic information is necessary to determine patient infection status. The expected result is Negative.  Fact Sheet for Patients: BloggerCourse.com  Fact Sheet for Healthcare Providers: SeriousBroker.it  This test is not yet approved or cleared by the [place]  FDA and  has been authorized for detection and/or diagnosis of SARS-CoV-2 by FDA under an Emergency Use Authorization (EUA).  This EUA will remain in effect (meaning this test can be used) for the duration of  the COVID-19 declaration under Section 564(b)(1) of the A ct, 21 U.S.C. section 360bbb-3(b)(1), unless the authorization is terminated or revoked sooner.     Influenza A by PCR NEGATIVE NEGATIVE Final   Influenza B by PCR NEGATIVE NEGATIVE Final    Comment: (NOTE) The Xpert Xpress SARS-CoV-2/FLU/RSV plus assay is intended as an aid in the diagnosis of influenza from Nasopharyngeal swab specimens and should not be used as a sole basis for  treatment. Nasal washings and aspirates are unacceptable for Xpert Xpress SARS-CoV-2/FLU/RSV testing.  Fact Sheet for Patients: BloggerCourse.com  Fact Sheet for Healthcare Providers: SeriousBroker.it  This test is not yet approved or cleared by the [place]  FDA and has been authorized for detection and/or diagnosis of SARS-CoV-2 by FDA under an Emergency Use Authorization (EUA). This EUA will remain in effect (meaning this test can be used) for the duration of the COVID-19 declaration under Section 564(b)(1) of the Act, 21 U.S.C. section 360bbb-3(b)(1), unless the authorization is terminated or revoked.     Resp Syncytial Virus by PCR NEGATIVE NEGATIVE Final    Comment: (NOTE) Fact Sheet for Patients: BloggerCourse.com  Fact Sheet for Healthcare Providers: SeriousBroker.it  This test is not yet approved or cleared by the [place]  FDA and has been authorized for detection and/or diagnosis of SARS-CoV-2 by FDA under an Emergency Use Authorization (EUA). This EUA will remain in effect (meaning this test can be used) for the duration of the COVID-19 declaration under Section 564(b)(1) of the Act, 21 U.S.C. section 360bbb-3(b)(1), unless the authorization is terminated or  revoked.  Performed at [facility], [address] [place], [place] [address]   Blood culture (routine x 2)     Status: None (Preliminary result)   Collection Time: 01/05/24  6:28 PM   Specimen: BLOOD  Result Value Ref Range Status   Specimen Description   Final    BLOOD LEFT ANTECUBITAL Performed at [facility], [address] [place], [place] [address]    Special Requests   Final    BOTTLES DRAWN AEROBIC AND ANAEROBIC Blood Culture results may not be optimal due to an inadequate volume of blood received in culture bottles Performed at [facility], [address] [place], [place] [address]    Culture   Final    NO GROWTH 3 DAYS Performed at [facility], [address], [place], [place] [address]    Report Status PENDING  Incomplete  Blood culture (routine x 2)     Status: None (Preliminary result)   Collection Time: 01/05/24  6:53 PM   Specimen: BLOOD  Result Value Ref Range Status   Specimen Description   Final    BLOOD RIGHT ANTECUBITAL Performed at [facility], [address] [place], [place] [address]    Special Requests   Final    BOTTLES DRAWN AEROBIC AND ANAEROBIC Blood Culture adequate volume Performed at [facility], [address] [place], [place] [address]    Culture   Final    NO GROWTH 2 DAYS Performed at [facility], [address], [place], [place] [address]    Report Status PENDING  Incomplete     Discharge Instructions:   Discharge Instructions     Diet - low sodium heart healthy   Complete by: As directed    Increase activity slowly   Complete by: As directed       Allergies as of 01/08/2024   No Known Allergies      Medication List     TAKE these medications    amLODipine  5 MG tablet Commonly known as: NORVASC  Take 1.5 tablets (7.5 mg total) by mouth daily. What changed: when to take this   amoxicillin -clavulanate 875-125 MG tablet Commonly known as: AUGMENTIN  Take 1 tablet by mouth every 12 (twelve) hours.   aspirin  81 MG tablet Take 81 mg by mouth at bedtime.   benzonatate  100 MG capsule Commonly known as: TESSALON  Take 1 capsule (100 mg total) by mouth every 8 (eight) hours.   cholecalciferol  1000 units tablet Commonly known as: VITAMIN D  Take 1,000 Units by mouth at bedtime.   clopidogrel  75 MG tablet Commonly known as: PLAVIX  Take 1 tablet (75 mg total) by mouth daily. What changed: when to take this   Co Q 10 100 MG Caps Take 300 mg by mouth at bedtime.   dorzolamide 2 % ophthalmic solution Commonly known as: TRUSOPT Place 1 drop  into both eyes daily as needed (irritation).   doxycycline  100 MG capsule Commonly known as: VIBRAMYCIN  Take 1 capsule (100 mg total) by mouth 2 (two) times daily.   ezetimibe  10 MG tablet Commonly known as: ZETIA  Take 1 tablet (10 mg total) by mouth daily. What changed: when to take this   isosorbide  mononitrate 120 MG 24 hr tablet Commonly known as: IMDUR  Take 1 tablet (120 mg total) by mouth daily. What changed: when to take this   Lokelma  10 g Pack packet Generic drug: sodium  zirconium cyclosilicate Take 10 g (1 packet total) by mouth as needed per instruction by provider based on blood work.. What changed: when to take this   metoprolol  tartrate 25 MG tablet Commonly known as: LOPRESSOR  Take 1 tablet (25 mg total) by mouth at bedtime.   nitroGLYCERIN  0.4 MG SL tablet Commonly known as: NITROSTAT  Place 1 tablet (0.4 mg total) under the tongue every 5 (five) minutes as needed for chest pain.   omega-3 acid ethyl esters 1 g capsule Commonly known as: LOVAZA  TAKE 1 CAPSULE BY MOUTH 2 TIMES DAILY.   ondansetron  4 MG disintegrating tablet Commonly known as: ZOFRAN -ODT 4mg  ODT q4 hours prn nausea/vomit   ranolazine  1000 MG SR tablet Commonly known as: Ranexa  Take 1 tablet (1,000 mg total) by mouth 2 (two) times daily.   rosuvastatin  40 MG tablet Commonly known as: CRESTOR  Take 1 tablet (40 mg total) by mouth daily. What changed: when to take this   valsartan  80 MG tablet Commonly known as: DIOVAN  Take 1 tablet (80 mg total) by mouth daily. What changed: when to take this   vitamin B-12 100 MCG tablet Commonly known as: CYANOCOBALAMIN  Take 100 mcg by mouth at bedtime.   VITAMIN C  PO Take 1 tablet by mouth at bedtime.        Follow-up Information     Katrinka Garnette KIDD, MD Follow up.   Specialty: Family Medicine Contact information: [address] [place] [address] [phone]                  Time coordinating discharge: 45  min  Signed:  Harlene RAYMOND Bowl DO  [facility] 01/08/2024, 9:04 AM

## 2024-01-08 NOTE — Progress Notes (Signed)
 SATURATION QUALIFICATIONS: (This note is used to comply with regulatory documentation for home oxygen)  Patient Saturations on Room Air at Rest = 100%  Patient Saturations on Room Air while Ambulating = 100%   Patient does not require oxygen while ambulating to maintain saturation level at 100%.  Kenna Seward, Lonell Louder, RN

## 2024-01-08 NOTE — Progress Notes (Signed)
 Reviewed discharge instructions per the AVS with patient and wife including medications and follow up appointments. Patient and wife verbalized understanding of instructions. Rumi Taras, Lonell Louder, RN

## 2024-01-08 NOTE — TOC Transition Note (Signed)
 Transition of Care [facility]) - Discharge Note  Patient Details  Name: Joshua Hahn MRN: [id number] Date of Birth: 1946/04/17  Transition of Care [facility]) CM/SW Contact:  Duwaine GORMAN Aran, LCSW Phone Number: 01/08/2024, 9:11 AM  Clinical Narrative: No discharge needs identified at this time. Care management signing off.  Final next level of care: Home/Self Care Barriers to Discharge: Barriers Resolved  Patient Goals and CMS Choice Patient states their goals for this hospitalization and ongoing recovery are:: Home  Discharge Plan and Services Additional resources added to the After Visit Summary for   In-house Referral: Clinical Social Work    DME Arranged: N/A DME Agency: NA  Social Drivers of Health (SDOH) Interventions SDOH Screenings   Food Insecurity: No Food Insecurity (01/05/2024)  Housing: Low Risk  (01/05/2024)  Transportation Needs: No Transportation Needs (01/05/2024)  Utilities: Not At Risk (01/05/2024)  Depression (PHQ2-9): Low Risk  (10/17/2023)  Financial Resource Strain: Low Risk  (04/08/2023)  Physical Activity: Insufficiently Active (04/08/2023)  Social Connections: Socially Integrated (01/05/2024)  Stress: No Stress Concern Present (04/08/2023)  Tobacco Use: Medium Risk (01/05/2024)  Health Literacy: Adequate Health Literacy (04/11/2023)   Readmission Risk Interventions     No data to display

## 2024-01-10 ENCOUNTER — Encounter (HOSPITAL_COMMUNITY)

## 2024-01-10 LAB — CULTURE, BLOOD (ROUTINE X 2): Culture: NO GROWTH

## 2024-01-11 LAB — CULTURE, BLOOD (ROUTINE X 2)
Culture: NO GROWTH
Special Requests: ADEQUATE

## 2024-01-13 ENCOUNTER — Encounter (HOSPITAL_COMMUNITY)

## 2024-01-13 NOTE — Progress Notes (Signed)
 Cardiac Individual Treatment Plan  Patient Details  Name: Joshua Hahn MRN: [id number] Date of Birth: 03-28-1946 Referring Provider:   Flowsheet Row INTENSIVE CARDIAC REHAB ORIENT from 10/17/2023 in [facility] for Heart, Vascular, & Lung Health  Referring Provider Dr. Ozell Fell MD    Initial Encounter Date:  Flowsheet Row INTENSIVE CARDIAC REHAB ORIENT from 10/17/2023 in [facility] for Heart, Vascular, & Lung Health  Date 10/17/23    Visit Diagnosis: 09/20/23 S/P DES CFX  Patient's Home Medications on Admission:  Current Outpatient Medications:    amLODipine  (NORVASC ) 5 MG tablet, Take 1.5 tablets (7.5 mg total) by mouth daily. (Patient taking differently: Take 7.5 mg by mouth at bedtime.), Disp: 90 tablet, Rfl: 3   amoxicillin -clavulanate (AUGMENTIN ) 875-125 MG tablet, Take 1 tablet by mouth every 12 (twelve) hours., Disp: 14 tablet, Rfl: 0   Ascorbic Acid  (VITAMIN C  PO), Take 1 tablet by mouth at bedtime., Disp: , Rfl:    aspirin  81 MG tablet, Take 81 mg by mouth at bedtime., Disp: , Rfl:    benzonatate  (TESSALON ) 100 MG capsule, Take 1 capsule (100 mg total) by mouth every 8 (eight) hours., Disp: 21 capsule, Rfl: 0   cholecalciferol  (VITAMIN D ) 1000 UNITS tablet, Take 1,000 Units by mouth at bedtime., Disp: , Rfl:    clopidogrel  (PLAVIX ) 75 MG tablet, Take 1 tablet (75 mg total) by mouth daily. (Patient taking differently: Take 75 mg by mouth at bedtime.), Disp: 90 tablet, Rfl: 3   Coenzyme Q10 (CO Q 10) 100 MG CAPS, Take 300 mg by mouth at bedtime., Disp: , Rfl:    dorzolamide (TRUSOPT) 2 % ophthalmic solution, Place 1 drop into both eyes daily as needed (irritation)., Disp: , Rfl:    doxycycline  (VIBRAMYCIN ) 100 MG capsule, Take 1 capsule (100 mg total) by mouth 2 (two) times daily., Disp: 6 capsule, Rfl: 0   ezetimibe  (ZETIA ) 10 MG tablet, Take 1 tablet (10 mg total) by mouth daily. (Patient taking differently: Take 10 mg by  mouth at bedtime.), Disp: 90 tablet, Rfl: 3   isosorbide  mononitrate (IMDUR ) 120 MG 24 hr tablet, Take 1 tablet (120 mg total) by mouth daily. (Patient taking differently: Take 120 mg by mouth at bedtime.), Disp: 90 tablet, Rfl: 3   metoprolol  tartrate (LOPRESSOR ) 25 MG tablet, Take 1 tablet (25 mg total) by mouth at bedtime., Disp: , Rfl:    nitroGLYCERIN  (NITROSTAT ) 0.4 MG SL tablet, Place 1 tablet (0.4 mg total) under the tongue every 5 (five) minutes as needed for chest pain., Disp: 25 tablet, Rfl: 11   omega-3 acid ethyl esters (LOVAZA ) 1 g capsule, TAKE 1 CAPSULE BY MOUTH 2 TIMES DAILY., Disp: 180 capsule, Rfl: 2   ondansetron  (ZOFRAN -ODT) 4 MG disintegrating tablet, 4mg  ODT q4 hours prn nausea/vomit, Disp: 20 tablet, Rfl: 0   ranolazine  (RANEXA ) 1000 MG SR tablet, Take 1 tablet (1,000 mg total) by mouth 2 (two) times daily., Disp: 180 tablet, Rfl: 3   rosuvastatin  (CRESTOR ) 40 MG tablet, Take 1 tablet (40 mg total) by mouth daily. (Patient taking differently: Take 40 mg by mouth at bedtime.), Disp: 90 tablet, Rfl: 3   sodium zirconium cyclosilicate  (LOKELMA ) 10 g PACK packet, Take 10 g (1 packet total) by mouth as needed per instruction by provider based on blood work.. (Patient taking differently: Take 1 packet by mouth daily.), Disp: 7 packet, Rfl: 0   valsartan  (DIOVAN ) 80 MG tablet, Take 1 tablet (80 mg total) by  mouth daily. 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 levels LDL IIIa+b% and HDL2b%   Hypertension    Myocardial infarct [facility])    Myocardial infarction (HCC) 1994   inferior wall MI at [facility] hospitaland CABG   Prostate cancer [facility])    Sleep apnea    uses CPAP PRN   Sleep apnea, obstructive    using C-PAP    Tobacco Use: Social History   Tobacco Use  Smoking Status Former   Current packs/day: 0.00   Types: Cigarettes   Quit date: 05/07/1973   Years since quitting: 50.7   Passive exposure: Never  Smokeless Tobacco Never    Labs: Review Flowsheet  More data exists      Latest Ref Rng & Units 04/28/2020 11/29/2020 08/16/2022 04/02/2023 09/10/2023  Labs for ITP Cardiac and Pulmonary Rehab  Cholestrol 100 - 199 mg/dL 846  886  882  877  873   LDL (calc) 0 - 99 mg/dL 87  54  56  63  61   HDL-C >39 mg/dL 48  39  39  40  43   Trlycerides 0 - 149 mg/dL 95  892  876  895  878   Hemoglobin A1c 4.8 - 5.6 % 7.0  - - - -     Exercise Target Goals: Exercise Program Goal: Individual exercise prescription set using results from initial 6 min walk test and THRR while considering  patient's activity barriers and safety.   Exercise Prescription Goal: Initial exercise prescription builds to 30-45 minutes a day of aerobic activity, 2-3 days per week.  Home exercise guidelines will be given to patient during program as part of exercise prescription that the participant will acknowledge.   Education: Aerobic Exercise: - Group verbal and visual presentation on the components of exercise prescription. Introduces F.I.T.T principle from ACSM for exercise prescriptions.  Reviews F.I.T.T. principles of aerobic exercise including progression. Written material provided at class time.   Education: Resistance Exercise: - Group verbal and visual presentation on the components of exercise prescription. Introduces F.I.T.T principle from ACSM for exercise prescriptions  Reviews F.I.T.T. principles of resistance exercise  including progression. Written material provided at class time.    Education: Exercise & Equipment Safety: - Individual verbal instruction and demonstration of equipment use and safety with use of the equipment.   Education: Exercise Physiology & General Exercise Guidelines: - Group verbal and written instruction with models to review the exercise physiology of the cardiovascular system and associated critical values. Provides general exercise guidelines with specific guidelines to those with heart or lung disease. Written material provided at class time.   Education: Flexibility, Balance, Mind/Body Relaxation: - Group verbal and visual presentation with interactive activity on the components of exercise prescription. Introduces F.I.T.T principle from ACSM for exercise prescriptions. Reviews F.I.T.T. principles of flexibility and balance exercise training including progression. Also discusses the mind body connection.  Reviews  various relaxation techniques to help reduce and manage stress (i.e. Deep breathing, progressive muscle relaxation, and visualization). Balance handout provided to take home. Written material provided at class time.   Activity Barriers & Risk Stratification:  Activity Barriers & Cardiac Risk Stratification - 10/17/23 1521       Activity Barriers & Cardiac Risk Stratification   Activity Barriers None    Cardiac Risk Stratification High   Under 5 MET's         6 Minute Walk:  6 Minute Walk     Row Name 10/17/23 1514         6 Minute Walk   Phase Initial     Distance 1575 feet     Walk Time 6 minutes     # of Rest Breaks 0     MPH 2.98     METS 2.67     RPE 9     Perceived Dyspnea  0     VO2 Peak 9.35     Symptoms Yes (comment)     Comments PAC's, asymptomatic     Resting HR 60 bpm     Resting BP 118/74     Resting Oxygen Saturation  98 %     Exercise Oxygen Saturation  during 6 min walk 99 %     Max Ex. HR 71 bpm     Max Ex. BP 150/56     2  Minute Post BP 128/60        Oxygen Initial Assessment:   Oxygen Re-Evaluation:   Oxygen Discharge (Final Oxygen Re-Evaluation):   Initial Exercise Prescription:  Initial Exercise Prescription - 10/17/23 1500       Date of Initial Exercise RX and Referring Provider   Date 10/17/23    Referring Provider Dr. Ozell Fell MD    Expected Discharge Date 01/08/24      Bike   Level 1    Watts 40    Minutes 15    METs 2.5      NuStep   Level 1    SPM 75    Minutes 15    METs 1.8      Prescription Details   Frequency (times per week) 3    Duration Progress to 30 minutes of continuous aerobic without signs/symptoms of physical distress      Intensity   THRR 40-80% of Max Heartrate 57-114    Ratings of Perceived Exertion 11-13    Perceived Dyspnea 0-4      Progression   Progression Continue progressive overload as per policy without signs/symptoms or physical distress.      Resistance Training   Training Prescription Yes    Weight 4    Reps 10-15          Perform Capillary Blood Glucose checks as needed.  Exercise Prescription Changes:   Exercise Prescription Changes     Row Name 11/15/23 0814 11/29/23 0836 12/13/23 0821 12/27/23 0829       Response to Exercise   Blood Pressure (Admit) 146/70 118/70 120/74 118/58    Blood Pressure (Exercise) 132/67 -- -- --    Blood Pressure (Exit) 108/54 98/50 102/54 102/58    Heart Rate (Admit) 63 bpm 64 bpm 62 bpm 73 bpm    Heart Rate (Exercise) 90 bpm 92 bpm 113 bpm 98 bpm    Heart Rate (Exit) 65 bpm 47 bpm 71 bpm 57 bpm    Rating of Perceived Exertion (Exercise) 11 11 11  11.5  Perceived Dyspnea (Exercise) 0 0 0 0    Symptoms 0 0 0 0    Comments Reviewed MET's, goals and home ExRx Reviewed MET's Reviewed MET's, goals and home ExRx REVD MET's    Duration Progress to 30 minutes of  aerobic without signs/symptoms of physical distress Progress to 30 minutes of  aerobic without signs/symptoms of physical distress  Progress to 30 minutes of  aerobic without signs/symptoms of physical distress Progress to 30 minutes of  aerobic without signs/symptoms of physical distress    Intensity THRR unchanged THRR unchanged THRR unchanged THRR unchanged      Progression   Progression Continue to progress workloads to maintain intensity without signs/symptoms of physical distress. Continue to progress workloads to maintain intensity without signs/symptoms of physical distress. Continue to progress workloads to maintain intensity without signs/symptoms of physical distress. Continue to progress workloads to maintain intensity without signs/symptoms of physical distress.    Average METs 3.15 3.5 3.75 4.15      Resistance Training   Training Prescription Yes Yes Yes Yes    Weight 4 4 5 6     Reps 10-15 10-15 10-15 10-15    Time 10 Minutes 10 Minutes 10 Minutes 10 Minutes      Bike   Level 2 2.5 2.5 3    Watts 35 47 54 51    Minutes 76 80 86 86    METs 3.2 3.4 3.7 3.5      NuStep   Level 2 2 2 4     SPM 132 125 127 134    Minutes 15 15 15 15     METs 3.1 3.6 3.8 4.8      Home Exercise Plan   Plans to continue exercise at Home (comment) Home (comment) Home (comment) Home (comment)    Frequency Add 2 additional days to program exercise sessions. Add 2 additional days to program exercise sessions. Add 2 additional days to program exercise sessions. Add 2 additional days to program exercise sessions.    Initial Home Exercises Provided 11/15/23 11/15/23 11/15/23 11/15/23       Exercise Comments:   Exercise Comments     Row Name 10/21/23 [phone] 11/15/23 0831 11/29/23 0837 12/13/23 0826 12/27/23 0835   Exercise Comments Pt first day in the [facility] ICR program. Pt tolerated exercise well with an average MET level of 2.5. He is off to a good start and is learning his THRR, RPE and ExRx Reviewed MET's, goals and home ExRx. Pt tolerated exercise well with an average MET level of 3.15. He is doing well and progressing  workloads and MET's. He feels good about his goals and is learning more about mindful wellness and is increasing endurance. He will add in exercise 1-2 days for 30-45 mins by walking, treadmill and stationary bike. Reviewed MET's. Pt tolerated exercise well with an average MET level of 3.4. He is doing well and progressing workloads and MET's. Working on gradual progression. Reviewed MET's, goals and home ExRx. Pt tolerated exercise well with an average MET level of 3.15. He will add in exercise 2 days for 30-45 mins by walking and going to [employer]. He is doing well with his goals and is having sucessful weight loss. He is feeling an increase in energy too. Reviewed MET's. Pt tolerated exercise well with an average MET level of 4.15. He is doing well and progressing MET's, working on gradual progression and increases on WL's.      Exercise Goals and Review:   Exercise  Goals     Row Name 10/17/23 0946             Exercise Goals   Increase Physical Activity Yes       Intervention Provide advice, education, support and counseling about physical activity/exercise needs.;Develop an individualized exercise prescription for aerobic and resistive training based on initial evaluation findings, risk stratification, comorbidities and participant's personal goals.       Expected Outcomes Short Term: Attend rehab on a regular basis to increase amount of physical activity.;Long Term: Exercising regularly at least 3-5 days a week.;Long Term: Add in home exercise to make exercise part of routine and to increase amount of physical activity.       Increase Strength and Stamina Yes       Intervention Provide advice, education, support and counseling about physical activity/exercise needs.;Develop an individualized exercise prescription for aerobic and resistive training based on initial evaluation findings, risk stratification, comorbidities and participant's personal goals.       Expected Outcomes Short  Term: Increase workloads from initial exercise prescription for resistance, speed, and METs.;Short Term: Perform resistance training exercises routinely during rehab and add in resistance training at home;Long Term: Improve cardiorespiratory fitness, muscular endurance and strength as measured by increased METs and functional capacity ( )       Able to understand and use rate of perceived exertion (RPE) scale Yes       Intervention Provide education and explanation on how to use RPE scale       Expected Outcomes Short Term: Able to use RPE daily in rehab to express subjective intensity level;Long Term:  Able to use RPE to guide intensity level when exercising independently       Knowledge and understanding of Target Heart Rate Range (THRR) Yes       Intervention Provide education and explanation of THRR including how the numbers were predicted and where they are located for reference       Expected Outcomes Short Term: Able to state/look up THRR;Short Term: Able to use daily as guideline for intensity in rehab;Long Term: Able to use THRR to govern intensity when exercising independently       Understanding of Exercise Prescription Yes       Intervention Provide education, explanation, and written materials on patient's individual exercise prescription       Expected Outcomes Short Term: Able to explain program exercise prescription;Long Term: Able to explain home exercise prescription to exercise independently          Exercise Goals Re-Evaluation :  Exercise Goals Re-Evaluation     Row Name 10/21/23 0937 11/15/23 0825 12/13/23 0823 12/27/23 0834       Exercise Goal Re-Evaluation   Exercise Goals Review Increase Physical Activity;Understanding of Exercise Prescription;Increase Strength and Stamina;Knowledge and understanding of Target Heart Rate Range (THRR);Able to understand and use rate of perceived exertion (RPE) scale Increase Physical Activity;Understanding of Exercise  Prescription;Increase Strength and Stamina;Knowledge and understanding of Target Heart Rate Range (THRR);Able to understand and use rate of perceived exertion (RPE) scale Increase Physical Activity;Understanding of Exercise Prescription;Increase Strength and Stamina;Knowledge and understanding of Target Heart Rate Range (THRR);Able to understand and use rate of perceived exertion (RPE) scale Increase Physical Activity;Understanding of Exercise Prescription;Increase Strength and Stamina;Knowledge and understanding of Target Heart Rate Range (THRR);Able to understand and use rate of perceived exertion (RPE) scale    Comments Pt first day in the [facility] I[facility] program. Pt tolerated exercise well with an average MET level of 2.5.  He is off to a good start and is learning his THRR, RPE and ExRx Reviewed MET's, goals and home ExRx. Pt tolerated exercise well with an average MET level of 3.15. He is doing well and progressing workloads and MET's. He feels good about his goals and is learning more about mindful wellness and is increasing endurance. He will add in exercise 1-2 days for 30-45 mins by walking, treadmill and stationary bike. Reviewed MET's, goals and home ExRx. Pt tolerated exercise well with an average MET level of 3.15. He will add in exercise 2 days for 30-45 mins by walking and going to [employer]. He is doing well with his goals and is having sucessful weight loss. He is feeling an increase in energy too. Reviewed MET's. Pt tolerated exercise well with an average MET level of 4.15. He is doing well and progressing MET's, working on gradual progression and increases on WL's.    Expected Outcomes Will continue to monitor pt and progress workloads as tolerated without sign or symptom Will continue to monitor pt and progress workloads as tolerated without sign or symptom Will continue to monitor pt and progress workloads as tolerated without sign or symptom Will continue to monitor pt and progress  workloads as tolerated without sign or symptom       Discharge Exercise Prescription (Final Exercise Prescription Changes):  Exercise Prescription Changes - 12/27/23 0829       Response to Exercise   Blood Pressure (Admit) 118/58    Blood Pressure (Exit) 102/58    Heart Rate (Admit) 73 bpm    Heart Rate (Exercise) 98 bpm    Heart Rate (Exit) 57 bpm    Rating of Perceived Exertion (Exercise) 11.5    Perceived Dyspnea (Exercise) 0    Symptoms 0    Comments REVD MET's    Duration Progress to 30 minutes of  aerobic without signs/symptoms of physical distress    Intensity THRR unchanged      Progression   Progression Continue to progress workloads to maintain intensity without signs/symptoms of physical distress.    Average METs 4.15      Resistance Training   Training Prescription Yes    Weight 6    Reps 10-15    Time 10 Minutes      Bike   Level 3    Watts 51    Minutes 86    METs 3.5      NuStep   Level 4    SPM 134    Minutes 15    METs 4.8      Home Exercise Plan   Plans to continue exercise at Home (comment)    Frequency Add 2 additional days to program exercise sessions.    Initial Home Exercises Provided 11/15/23          Nutrition:  Target Goals: Understanding of nutrition guidelines, daily intake of sodium 1500mg , cholesterol 200mg , calories 30% from fat and 7% or less from saturated fats, daily to have 5 or more servings of fruits and vegetables.  Education: Nutrition 1 -Group instruction provided by verbal, written material, interactive activities, discussions, models, and posters to present general guidelines for heart healthy nutrition including macronutrients, label reading, and promoting whole foods over processed counterparts. Education serves as Pensions consultant of discussion of heart healthy eating for all. Written material provided at class time.    Education: Nutrition 2 -Group instruction provided by verbal, written material, interactive  activities, discussions, models, and posters to present general guidelines  for heart healthy nutrition including sodium, cholesterol, and saturated fat. Providing guidance of habit forming to improve blood pressure, cholesterol, and body weight. Written material provided at class time.     Biometrics:  Pre Biometrics - 10/17/23 1510       Pre Biometrics   Waist Circumference 40.5 inches    Hip Circumference 40 inches    Waist to Hip Ratio 1.01 %    Triceps Skinfold 19 mm    % Body Fat 29.6 %    Grip Strength 37 kg    Flexibility --   unable to reach   Single Leg Stand 30 seconds           Nutrition Therapy Plan and Nutrition Goals:  Nutrition Therapy & Goals - 12/16/23 0859       Nutrition Therapy   Diet Heart Healthy Diet    Drug/Food Interactions Statins/Certain Fruits      Personal Nutrition Goals   Nutrition Goal Patient to identify strategies for reducing cardiovascular risk by attending the [facility] education and nutrition series weekly.   goal in action.   Personal Goal #2 Patient to improve diet quality by using the plate method as a guide for meal planning to include lean protein/plant protein, fruits, vegetables, whole grains, nonfat dairy as part of a well-balanced diet.   goal in action.   Personal Goal #3 Patient to identify strategies for weight loss of 0.5-2.0# per week.   goal in action.   Comments Goals in action. Patient has medical history of CAD s/p CABG, HTN, hyperlipidemia, CKD3, OSA, old MI.He continues to attend the [facility] education/nutrition series regularly. LDL at goal. He is followed by Nephrology; potassium historically elevated but now in acceptable range. Patient is motivated to lose weight and have discussed strategies for weight loss including calorie density, the plate method as a guide for meal planning, label reading, etc.  He is down 3.5# since starting with our program. Patient will benefit from participation/completion of intensive cardiac  rehab and adherence to nutrition, exercise, and lifestyle modification.      Intervention Plan   Intervention Prescribe, educate and counsel regarding individualized specific dietary modifications aiming towards targeted core components such as weight, hypertension, lipid management, diabetes, heart failure and other comorbidities.;Nutrition handout(s) given to patient.    Expected Outcomes Short Term Goal: Understand basic principles of dietary content, such as calories, fat, sodium, cholesterol and nutrients.;Long Term Goal: Adherence to prescribed nutrition plan.          Nutrition Assessments:  MEDIFICTS Score Key: >=70 Need to make dietary changes  40-70 Heart Healthy Diet <= 40 Therapeutic Level Cholesterol Diet  Flowsheet Row INTENSIVE CARDIAC REHAB from 10/21/2023 in [facility] for Heart, Vascular, & Lung Health  Picture Your Plate Total Score on Admission 67   Picture Your Plate Scores: <59 Unhealthy dietary pattern with much room for improvement. 41-50 Dietary pattern unlikely to meet recommendations for good health and room for improvement. 51-60 More healthful dietary pattern, with some room for improvement.  >60 Healthy dietary pattern, although there may be some specific behaviors that could be improved.    Nutrition Goals Re-Evaluation:  Nutrition Goals Re-Evaluation     Row Name 10/21/23 1036 11/18/23 0823 12/16/23 0859         Goals   Current Weight 211 lb 3.2 oz (95.8 kg) 207 lb 3.7 oz (94 kg) 207 lb 3.7 oz (94 kg)     Comment Cr 1.94, GFR 35, LDL  61, HDL 43, Lpa 222 Cr 1.94, GFR 35, LDL 61, HDL 43, Lpa 222 no new labs; most recent labs Cr 1.94, GFR 35, LDL 61, HDL 43, Lpa 222     Expected Outcome Patient has meidcal history of CAD s/p CABG, HTN, hyperlipidemia, CKD3, OSA, old MI. LDL at goal. He is followed by Nephrology; potassium was elevated but now in acceptable range. Patient is motivated to lose weight; will continue to discuss  strategies for weight loss including calorie density, the plate method as a guide for meal planning, label reading, etc. Patient will benefit from participation in intensive cardiac rehab for nutrition, exercise, and lifestyle modification. Goals in progress. Patient has medical history of CAD s/p CABG, HTN, hyperlipidemia, CKD3, OSA, old MI.He continues to attend the [facility] education/nutrition series regularly. LDL at goal. He is followed by Nephrology; potassium historically elevated but now in acceptable range. Patient is motivated to lose weight; will continue to discuss strategies for weight loss including calorie density, the plate method as a guide for meal planning, label reading, etc. He is down 3.5# since starting with our program. Patient will benefit from participation in intensive cardiac rehab for nutrition, exercise, and lifestyle modification. Goals in action. Patient has medical history of CAD s/p CABG, HTN, hyperlipidemia, CKD3, OSA, old MI.He continues to attend the [facility] education/nutrition series regularly. LDL at goal. He is followed by Nephrology; potassium historically elevated but now in acceptable range. Patient is motivated to lose weight and have discussed strategies for weight loss including calorie density, the plate method as a guide for meal planning, label reading, etc. He is down 3.5# since starting with our program. Patient will benefit from participation/completion of intensive cardiac rehab and adherence to nutrition, exercise, and lifestyle modification.        Nutrition Goals Discharge (Final Nutrition Goals Re-Evaluation):  Nutrition Goals Re-Evaluation - 12/16/23 0859       Goals   Current Weight 207 lb 3.7 oz (94 kg)    Comment no new labs; most recent labs Cr 1.94, GFR 35, LDL 61, HDL 43, Lpa 222    Expected Outcome Goals in action. Patient has medical history of CAD s/p CABG, HTN, hyperlipidemia, CKD3, OSA, old MI.He continues to attend the [facility]  education/nutrition series regularly. LDL at goal. He is followed by Nephrology; potassium historically elevated but now in acceptable range. Patient is motivated to lose weight and have discussed strategies for weight loss including calorie density, the plate method as a guide for meal planning, label reading, etc. He is down 3.5# since starting with our program. Patient will benefit from participation/completion of intensive cardiac rehab and adherence to nutrition, exercise, and lifestyle modification.          Psychosocial: Target Goals: Acknowledge presence or absence of significant depression and/or stress, maximize coping skills, provide positive support system. Participant is able to verbalize types and ability to use techniques and skills needed for reducing stress and depression.   Education: Stress, Anxiety, and Depression - Group verbal and visual presentation to define topics covered.  Reviews how body is impacted by stress, anxiety, and depression.  Also discusses healthy ways to reduce stress and to treat/manage anxiety and depression. Written material provided at class time.   Education: Sleep Hygiene -Provides group verbal and written instruction about how sleep can affect your health.  Define sleep hygiene, discuss sleep cycles and impact of sleep habits. Review good sleep hygiene tips.   Initial Review & Psychosocial Screening:  Initial Psych  Review & Screening - 01/10/24 1406       Initial Review   Source of Stress Concerns Family    Comments Will continue to montior and offer supoprt as needed.          Quality of Life Scores:   Quality of Life - 10/17/23 1525       Quality of Life   Select Quality of Life      Quality of Life Scores   Health/Function Pre 27.83 %    Socioeconomic Pre 28.07 %    Psych/Spiritual Pre 29.14 %    Family Pre 30 %    GLOBAL Pre 28.47 %         Scores of 19 and below usually indicate a poorer quality of life in these areas.   A difference of  2-3 points is a clinically meaningful difference.  A difference of 2-3 points in the total score of the Quality of Life Index has been associated with significant improvement in overall quality of life, self-image, physical symptoms, and general health in studies assessing change in quality of life.  PHQ-9: Review Flowsheet  More data exists      10/17/2023 04/11/2023 04/05/2022 03/23/2021 06/14/2020  Depression screen PHQ 2/9  Decreased Interest 0 0 0 0 0  Down, Depressed, Hopeless 0 0 0 0 0  PHQ - 2 Score 0 0 0 0 0  Altered sleeping 0 - - - -  Tired, decreased energy 1 - - - -  Change in appetite 0 - - - -  Feeling bad or failure about yourself  0 - - - -  Trouble concentrating 0 - - - -  Moving slowly or fidgety/restless 0 - - - -  Suicidal thoughts 0 - - - -  PHQ-9 Score 1 - - - -  Difficult doing work/chores Not difficult at all - - - -   Interpretation of Total Score  Total Score Depression Severity:  1-4 = Minimal depression, 5-9 = Mild depression, 10-14 = Moderate depression, 15-19 = Moderately severe depression, 20-27 = Severe depression   Psychosocial Evaluation and Intervention:   Psychosocial Re-Evaluation:  Psychosocial Re-Evaluation     Row Name 10/21/23 1654 11/12/23 1133 12/13/23 0935         Psychosocial Re-Evaluation   Current issues with Current Stress Concerns Current Stress Concerns Current Stress Concerns     Comments Joshua Hahn started cardiac rehab on 10/21/23. Joshua Hahn did not voice any increased concerns or stressor on his first day of exercise. Joshua Hahn has not voiced any increased concerns or stressors during exercise at cardiac rehab. Joshua Hahn has not voiced any increased concerns or stressors during exercise at cardiac rehab.     Expected Outcomes Joshua Hahn will  have controlled or decreased stressors upon completion of cardiac rehab Joshua Hahn will  have controlled or decreased stressors upon completion of  cardiac rehab Joshua Hahn will  have controlled or decreased stressors upon completion of cardiac rehab     Interventions Stress management education;Encouraged to attend Cardiac Rehabilitation for the exercise;Relaxation education Stress management education;Encouraged to attend Cardiac Rehabilitation for the exercise;Relaxation education Stress management education;Encouraged to attend Cardiac Rehabilitation for the exercise;Relaxation education     Continue Psychosocial Services  No Follow up required No Follow up required No Follow up required       Initial Review   Source of Stress Concerns Family Family Family     Comments Will continue to montior and offer  supoprt as needed. Will continue to montior and offer supoprt as needed. Will continue to montior and offer supoprt as needed.        Psychosocial Discharge (Final Psychosocial Re-Evaluation):  Psychosocial Re-Evaluation - 12/13/23 0935       Psychosocial Re-Evaluation   Current issues with Current Stress Concerns    Comments Joshua Hahn has not voiced any increased concerns or stressors during exercise at cardiac rehab.    Expected Outcomes Joshua Hahn will  have controlled or decreased stressors upon completion of cardiac rehab    Interventions Stress management education;Encouraged to attend Cardiac Rehabilitation for the exercise;Relaxation education    Continue Psychosocial Services  No Follow up required      Initial Review   Source of Stress Concerns Family    Comments Will continue to montior and offer supoprt as needed.          Vocational Rehabilitation: Provide vocational rehab assistance to qualifying candidates.   Vocational Rehab Evaluation & Intervention:  Vocational Rehab - 10/17/23 1336       Initial Vocational Rehab Evaluation & Intervention   Assessment shows need for Vocational Rehabilitation No   Joshua Hahn is semi retired and does not need vocational rehab at this time          Education: Education Goals: Education classes will be provided on a variety of topics geared toward better understanding of heart health and risk factor modification. Participant will state understanding/return demonstration of topics presented as noted by education test scores.  Learning Barriers/Preferences:  Learning Barriers/Preferences - 10/17/23 1335       Learning Barriers/Preferences   Learning Barriers Sight   wears glasses   Learning Preferences Pictoral;Video          General Cardiac Education Topics:  AED/CPR: - Group verbal and written instruction with the use of models to demonstrate the basic use of the AED with the basic ABC's of resuscitation.   Test and Procedures: - Group verbal and visual presentation and models provide information about basic cardiac anatomy and function. Reviews the testing methods done to diagnose heart disease and the outcomes of the test results. Describes the treatment choices: Medical Management, Angioplasty, or Coronary Bypass Surgery for treating various heart conditions including Myocardial Infarction, Angina, Valve Disease, and Cardiac Arrhythmias. Written material provided at class time.   Medication Safety: - Group verbal and visual instruction to review commonly prescribed medications for heart and lung disease. Reviews the medication, class of the drug, and side effects. Includes the steps to properly store meds and maintain the prescription regimen. Written material provided at class time.   Intimacy: - Group verbal instruction through game format to discuss how heart and lung disease can affect sexual intimacy. Written material provided at class time.   Know Your Numbers and Heart Failure: - Group verbal and visual instruction to discuss disease risk factors for cardiac and pulmonary disease and treatment options.  Reviews associated critical values for Overweight/Obesity, Hypertension, Cholesterol, and Diabetes.  Discusses  basics of heart failure: signs/symptoms and treatments.  Introduces Heart Failure Zone chart for action plan for heart failure. Written material provided at class time.   Infection Prevention: - Provides verbal and written material to individual with discussion of infection control including proper hand washing and proper equipment cleaning during exercise session.   Falls Prevention: - Provides verbal and written material to individual with discussion of falls prevention and safety.   Other: -Provides group and verbal instruction on various topics (see  comments)   Knowledge Questionnaire Score:  Knowledge Questionnaire Score - 10/17/23 1526       Knowledge Questionnaire Score   Pre Score 23/24          Core Components/Risk Factors/Patient Goals at Admission:  Personal Goals and Risk Factors at Admission - 10/17/23 1528       Core Components/Risk Factors/Patient Goals on Admission    Weight Management Yes;Weight Loss    Intervention Weight Management: Develop a combined nutrition and exercise program designed to reach desired caloric intake, while maintaining appropriate intake of nutrient and fiber, sodium and fats, and appropriate energy expenditure required for the weight goal.;Weight Management: Provide education and appropriate resources to help participant work on and attain dietary goals.    Admit Weight 210 lb 12.2 oz (95.6 kg)    Expected Outcomes Short Term: Continue to assess and modify interventions until short term weight is achieved;Long Term: Adherence to nutrition and physical activity/exercise program aimed toward attainment of established weight goal;Weight Loss: Understanding of general recommendations for a balanced deficit meal plan, which promotes 1-2 lb weight loss per week and includes a negative energy balance of [phone] kcal/d;Understanding recommendations for meals to include 15-35% energy as protein, 25-35% energy from fat, 35-60% energy from  carbohydrates, less than 200mg  of dietary cholesterol, 20-35 gm of total fiber daily;Understanding of distribution of calorie intake throughout the day with the consumption of 4-5 meals/snacks    Hypertension Yes    Intervention Provide education on lifestyle modifcations including regular physical activity/exercise, weight management, moderate sodium restriction and increased consumption of fresh fruit, vegetables, and low fat dairy, alcohol moderation, and smoking cessation.;Monitor prescription use compliance.    Expected Outcomes Short Term: Continued assessment and intervention until BP is < 140/34mm HG in hypertensive participants. < 130/20mm HG in hypertensive participants with diabetes, heart failure or chronic kidney disease.;Long Term: Maintenance of blood pressure at goal levels.    Lipids Yes    Intervention Provide education and support for participant on nutrition & aerobic/resistive exercise along with prescribed medications to achieve LDL 70mg , HDL >40mg .    Expected Outcomes Short Term: Participant states understanding of desired cholesterol values and is compliant with medications prescribed. Participant is following exercise prescription and nutrition guidelines.;Long Term: Cholesterol controlled with medications as prescribed, with individualized exercise RX and with personalized nutrition plan. Value goals: LDL < 70mg , HDL > 40 mg.    Stress Yes    Intervention Offer individual and/or small group education and counseling on adjustment to heart disease, stress management and health-related lifestyle change. Teach and support self-help strategies.;Refer participants experiencing significant psychosocial distress to appropriate mental health specialists for further evaluation and treatment. When possible, include family members and significant others in education/counseling sessions.    Expected Outcomes Short Term: Participant demonstrates changes in health-related behavior, relaxation  and other stress management skills, ability to obtain effective social support, and compliance with psychotropic medications if prescribed.;Long Term: Emotional wellbeing is indicated by absence of clinically significant psychosocial distress or social isolation.    Personal Goal Other Yes    Personal Goal Have the mind relax more, wants waist size at 36 inches, endurance    Intervention Will continue to monitor pt and progress workloads as tolerated without sign or symptom    Expected Outcomes Pt will achieve his goals          Education:Diabetes - Individual verbal and written instruction to review signs/symptoms of diabetes, desired ranges of glucose level fasting, after meals and with  exercise. Acknowledge that pre and post exercise glucose checks will be done for 3 sessions at entry of program.   Core Components/Risk Factors/Patient Goals Review:   Goals and Risk Factor Review     Row Name 10/21/23 1657 11/12/23 1135 12/13/23 0935 01/10/24 1406       Core Components/Risk Factors/Patient Goals Review   Personal Goals Review Weight Management/Obesity;Hypertension;Lipids;Stress Weight Management/Obesity;Hypertension;Lipids;Stress Weight Management/Obesity;Hypertension;Lipids;Stress Weight Management/Obesity;Hypertension;Lipids;Stress    Review Joshua Hahn started cardiac rehab on 10/21/23. Joshua Hahn did well with exercise. Vital signs were stable Joshua Hahn is doing well with exercise at cardiac rehab. Vital signs have been stable. Joshua Hahn has increased his met levels. Joshua Hahn is doing well with exercise at cardiac rehab. Vital signs have been stable. Joshua Hahn has increased his met levels. Joshua Hahn continues to do well with exercise at cardiac rehab. Vital signs have been stable. Joshua Hahn has increased his met levels.    Expected Outcomes Tremar Wickens will continue to particpate in cardiac rehab for exercise, nutrition and lifestyle modifications Dillinger Aston will  continue to particpate in cardiac rehab for exercise, nutrition and lifestyle modifications Tykeem Lanzer will continue to particpate in cardiac rehab for exercise, nutrition and lifestyle modifications Finbar will continue to particpate in cardiac rehab for exercise, nutrition and lifestyle modifications       Core Components/Risk Factors/Patient Goals at Discharge (Final Review):   Goals and Risk Factor Review - 01/10/24 1406       Core Components/Risk Factors/Patient Goals Review   Personal Goals Review Weight Management/Obesity;Hypertension;Lipids;Stress    Review Meghan continues to do well with exercise at cardiac rehab. Vital signs have been stable. Linnie has increased his met levels.    Expected Outcomes Zaiden will continue to particpate in cardiac rehab for exercise, nutrition and lifestyle modifications          ITP Comments:  ITP Comments     Row Name 10/17/23 0945 10/21/23 1652 11/12/23 1132 12/13/23 0934 01/10/24 1405   ITP Comments Wilbert Bihari, MD: Medical Director.  Intorduction to the [facility]/ Intensive Caridac Rehab.  Initial orientation packet reviewed with the patient. 30 Day ITP Review. Jason Frisbee started cardiac rehab on 10/21/23 30 Day ITP Review. Copelan Maultsby has good attendance and participation with exercise at  cardiac rehab 30 Day ITP Review. Diesel Lina has good attendance and participation with exercise at cardiac rehab 30 Day ITP Review. Jovon continues to have good attendance and participation with exercise at cardiac rehab      Comments: see ITP comments

## 2024-01-15 ENCOUNTER — Encounter (HOSPITAL_COMMUNITY)

## 2024-01-20 ENCOUNTER — Encounter (HOSPITAL_COMMUNITY)
Admission: RE | Admit: 2024-01-20 | Discharge: 2024-01-20 | Disposition: A | Discharge status: Home / Self Care | Source: Ambulatory Visit | Attending: Cardiovascular Disease | Admitting: Cardiovascular Disease

## 2024-01-20 DIAGNOSIS — Z955 Presence of coronary angioplasty implant and graft: Secondary | ICD-10-CM | POA: Diagnosis present

## 2024-01-21 NOTE — Progress Notes (Signed)
 Pt was recently admitted to hospital with PNA and Covid. Discharged 01/08/24, presented today 01/20/24 for CR and tolerated exercise well.  Pt cleared to continue exercising by Orren Fabry, NP as he is feeling well and shows no s/s of PNA or Covid.

## 2024-01-22 ENCOUNTER — Encounter (HOSPITAL_COMMUNITY)
Admission: RE | Admit: 2024-01-22 | Discharge: 2024-01-22 | Disposition: A | Discharge status: Home / Self Care | Source: Ambulatory Visit | Attending: Cardiovascular Disease | Admitting: Cardiovascular Disease

## 2024-01-22 DIAGNOSIS — Z955 Presence of coronary angioplasty implant and graft: Secondary | ICD-10-CM | POA: Diagnosis not present

## 2024-01-24 ENCOUNTER — Encounter (HOSPITAL_COMMUNITY)
Admission: RE | Admit: 2024-01-24 | Discharge: 2024-01-24 | Disposition: A | Discharge status: Home / Self Care | Source: Ambulatory Visit | Attending: Cardiovascular Disease | Admitting: Cardiovascular Disease

## 2024-01-24 VITALS — Ht 70.5 in | Wt 207.2 lb

## 2024-01-24 DIAGNOSIS — Z955 Presence of coronary angioplasty implant and graft: Secondary | ICD-10-CM

## 2024-01-27 ENCOUNTER — Encounter (HOSPITAL_COMMUNITY)
Admission: RE | Admit: 2024-01-27 | Discharge: 2024-01-27 | Disposition: A | Discharge status: Home / Self Care | Source: Ambulatory Visit | Attending: Cardiovascular Disease | Admitting: Cardiovascular Disease

## 2024-01-27 DIAGNOSIS — Z955 Presence of coronary angioplasty implant and graft: Secondary | ICD-10-CM | POA: Diagnosis not present

## 2024-01-28 NOTE — Progress Notes (Signed)
 Discharge Progress Report  Patient Details  Name: Joshua Hahn MRN: [id number] Date of Birth: 1946-03-26 Referring Provider:   Flowsheet Row INTENSIVE CARDIAC REHAB ORIENT from 10/17/2023 in [facility]  Referring Provider Dr. Ozell Fell MD     Number of Visits: 75  Reason for Discharge:  Patient reached a stable level of exercise. Patient independent in their exercise. Patient has met program and personal goals.  Smoking History:  Social History   Tobacco Use  Smoking Status Former   Current packs/day: 0.00   Types: Cigarettes   Quit date: 05/07/1973   Years since quitting: 50.7   Passive exposure: Never  Smokeless Tobacco Never    Diagnosis:  09/20/23 S/P DES CFX  ADL UCSD:   Initial Exercise Prescription:  Initial Exercise Prescription - 10/17/23 1500       Date of Initial Exercise RX and Referring Provider   Date 10/17/23    Referring Provider Dr. Ozell Fell MD    Expected Discharge Date 01/08/24      Bike   Level 1    Watts 40    Minutes 15    METs 2.5      NuStep   Level 1    SPM 75    Minutes 15    METs 1.8      Prescription Details   Frequency (times per week) 3    Duration Progress to 30 minutes of continuous aerobic without signs/symptoms of physical distress      Intensity   THRR 40-80% of Max Heartrate 57-114    Ratings of Perceived Exertion 11-13    Perceived Dyspnea 0-4      Progression   Progression Continue progressive overload as per policy without signs/symptoms or physical distress.      Resistance Training   Training Prescription Yes    Weight 4    Reps 10-15          Discharge Exercise Prescription (Final Exercise Prescription Changes):  Exercise Prescription Changes - 01/27/24 1642       Response to Exercise   Blood Pressure (Admit) 132/58    Blood Pressure (Exit) 110/60    Heart Rate (Admit) 61 bpm    Heart Rate (Exercise) 98 bpm    Heart  Rate (Exit) 69 bpm    Rating of Perceived Exertion (Exercise) 11    Perceived Dyspnea (Exercise) 0    Symptoms 0    Comments Pt graduated the [employer] program    Duration Progress to 30 minutes of  aerobic without signs/symptoms of physical distress    Intensity THRR unchanged      Progression   Progression Continue to progress workloads to maintain intensity without signs/symptoms of physical distress.    Average METs 4.15      Resistance Training   Training Prescription Yes    Weight 6    Reps 10-15    Time 10 Minutes      Bike   Level 3    Minutes 15    METs 3.6      NuStep   Level 4    SPM 110    Minutes 15    METs 4.7      Home Exercise Plan   Plans to continue exercise at Home (comment)    Frequency Add 2 additional days to program exercise sessions.    Initial Home Exercises Provided 11/15/23  Functional Capacity:  6 Minute Walk     Row Name 10/17/23 1514 01/24/24 0837       6 Minute Walk   Phase Initial Discharge    Distance 1575 feet 1680 feet    Distance % Change -- 6.67 %    Distance Feet Change -- 105 ft    Walk Time 6 minutes 6 minutes    # of Rest Breaks 0 0    MPH 2.98 3.18    METS 2.67 3.07    RPE 9 11    Perceived Dyspnea  0 0    VO2 Peak 9.35 10.75    Symptoms Yes (comment) No    Comments PAC's, asymptomatic --    Resting HR 60 bpm 63 bpm    Resting BP 118/74 122/72    Resting Oxygen Saturation  98 % --    Exercise Oxygen Saturation  during 6 min walk 99 % --    Max Ex. HR 71 bpm 103 bpm    Max Ex. BP 150/56 128/64    2 Minute Post BP 128/60 --       Psychological, QOL, Others - Outcomes: PHQ 2/9:    10/17/2023    1:37 PM 04/11/2023   11:35 AM 04/05/2022   10:59 AM 03/23/2021   11:12 AM 06/14/2020   11:12 AM  Depression screen PHQ 2/9  Decreased Interest 0 0 0 0 0  Down, Depressed, Hopeless 0 0 0 0 0  PHQ - 2 Score 0 0 0 0 0  Altered sleeping 0      Tired, decreased energy 1      Change in appetite 0       Feeling bad or failure about yourself  0      Trouble concentrating 0      Moving slowly or fidgety/restless 0      Suicidal thoughts 0      PHQ-9 Score 1      Difficult doing work/chores Not difficult at all        Quality of Life:  Quality of Life - 01/27/24 0918       Quality of Life   Select Quality of Life      Quality of Life Scores   Health/Function Post 28 %    Socioeconomic Post 29.69 %    Psych/Spiritual Post 29.14 %    Family Post 30 %    GLOBAL Post 28.9 %          Personal Goals: Goals established at orientation with interventions provided to work toward goal.  Personal Goals and Risk Factors at Admission - 10/17/23 1528       Core Components/Risk Factors/Patient Goals on Admission    Weight Management Yes;Weight Loss    Intervention Weight Management: Develop a combined nutrition and exercise program designed to reach desired caloric intake, while maintaining appropriate intake of nutrient and fiber, sodium and fats, and appropriate energy expenditure required for the weight goal.;Weight Management: Provide education and appropriate resources to help participant work on and attain dietary goals.    Admit Weight 210 lb 12.2 oz (95.6 kg)    Expected Outcomes Short Term: Continue to assess and modify interventions until short term weight is achieved;Long Term: Adherence to nutrition and physical activity/exercise program aimed toward attainment of established weight goal;Weight Loss: Understanding of general recommendations for a balanced deficit meal plan, which promotes 1-2 lb weight loss per week and includes a negative energy balance of [phone] kcal/d;Understanding recommendations for  meals to include 15-35% energy as protein, 25-35% energy from fat, 35-60% energy from carbohydrates, less than 200mg  of dietary cholesterol, 20-35 gm of total fiber daily;Understanding of distribution of calorie intake throughout the day with the consumption of 4-5 meals/snacks     Hypertension Yes    Intervention Provide education on lifestyle modifcations including regular physical activity/exercise, weight management, moderate sodium restriction and increased consumption of fresh fruit, vegetables, and low fat dairy, alcohol moderation, and smoking cessation.;Monitor prescription use compliance.    Expected Outcomes Short Term: Continued assessment and intervention until BP is < 140/27mm HG in hypertensive participants. < 130/69mm HG in hypertensive participants with diabetes, heart failure or chronic kidney disease.;Long Term: Maintenance of blood pressure at goal levels.    Lipids Yes    Intervention Provide education and support for participant on nutrition & aerobic/resistive exercise along with prescribed medications to achieve LDL 70mg , HDL >40mg .    Expected Outcomes Short Term: Participant states understanding of desired cholesterol values and is compliant with medications prescribed. Participant is following exercise prescription and nutrition guidelines.;Long Term: Cholesterol controlled with medications as prescribed, with individualized exercise RX and with personalized nutrition plan. Value goals: LDL < 70mg , HDL > 40 mg.    Stress Yes    Intervention Offer individual and/or small group education and counseling on adjustment to heart disease, stress management and health-related lifestyle change. Teach and support self-help strategies.;Refer participants experiencing significant psychosocial distress to appropriate mental health specialists for further evaluation and treatment. When possible, include family members and significant others in education/counseling sessions.    Expected Outcomes Short Term: Participant demonstrates changes in health-related behavior, relaxation and other stress management skills, ability to obtain effective social support, and compliance with psychotropic medications if prescribed.;Long Term: Emotional wellbeing is indicated by absence of  clinically significant psychosocial distress or social isolation.    Personal Goal Other Yes    Personal Goal Have the mind relax more, wants waist size at 36 inches, endurance    Intervention Will continue to monitor pt and progress workloads as tolerated without sign or symptom    Expected Outcomes Pt will achieve his goals           Personal Goals Discharge:  Goals and Risk Factor Review     Row Name 10/21/23 1657 11/12/23 1135 12/13/23 0935 01/10/24 1406       Core Components/Risk Factors/Patient Goals Review   Personal Goals Review Weight Management/Obesity;Hypertension;Lipids;Stress Weight Management/Obesity;Hypertension;Lipids;Stress Weight Management/Obesity;Hypertension;Lipids;Stress Weight Management/Obesity;Hypertension;Lipids;Stress    Review Lino Wickliff started cardiac rehab on 10/21/23. Juliene Sprang did well with exercise. Vital signs were stable Kipling Graser is doing well with exercise at cardiac rehab. Vital signs have been stable. Dabid Godown has increased his met levels. Hester Forget is doing well with exercise at cardiac rehab. Vital signs have been stable. Seymour Pavlak has increased his met levels. Mansur continues to do well with exercise at cardiac rehab. Vital signs have been stable. Bradlee has increased his met levels.    Expected Outcomes Quest Tavenner will continue to particpate in cardiac rehab for exercise, nutrition and lifestyle modifications Tysin Salada will continue to particpate in cardiac rehab for exercise, nutrition and lifestyle modifications Jamauri Kruzel will continue to particpate in cardiac rehab for exercise, nutrition and lifestyle modifications Korry will continue to particpate in cardiac rehab for exercise, nutrition and lifestyle modifications       Exercise Goals and Review:  Exercise Goals     Row Name 10/17/23 [phone]  Exercise Goals   Increase Physical Activity Yes       Intervention Provide advice, education,  support and counseling about physical activity/exercise needs.;Develop an individualized exercise prescription for aerobic and resistive training based on initial evaluation findings, risk stratification, comorbidities and participant's personal goals.       Expected Outcomes Short Term: Attend rehab on a regular basis to increase amount of physical activity.;Long Term: Exercising regularly at least 3-5 days a week.;Long Term: Add in home exercise to make exercise part of routine and to increase amount of physical activity.       Increase Strength and Stamina Yes       Intervention Provide advice, education, support and counseling about physical activity/exercise needs.;Develop an individualized exercise prescription for aerobic and resistive training based on initial evaluation findings, risk stratification, comorbidities and participant's personal goals.       Expected Outcomes Short Term: Increase workloads from initial exercise prescription for resistance, speed, and METs.;Short Term: Perform resistance training exercises routinely during rehab and add in resistance training at home;Long Term: Improve cardiorespiratory fitness, muscular endurance and strength as measured by increased METs and functional capacity ( )       Able to understand and use rate of perceived exertion (RPE) scale Yes       Intervention Provide education and explanation on how to use RPE scale       Expected Outcomes Short Term: Able to use RPE daily in rehab to express subjective intensity level;Long Term:  Able to use RPE to guide intensity level when exercising independently       Knowledge and understanding of Target Heart Rate Range (THRR) Yes       Intervention Provide education and explanation of THRR including how the numbers were predicted and where they are located for reference       Expected Outcomes Short Term: Able to state/look up THRR;Short Term: Able to use daily as guideline for intensity in rehab;Long Term:  Able to use THRR to govern intensity when exercising independently       Understanding of Exercise Prescription Yes       Intervention Provide education, explanation, and written materials on patient's individual exercise prescription       Expected Outcomes Short Term: Able to explain program exercise prescription;Long Term: Able to explain home exercise prescription to exercise independently          Exercise Goals Re-Evaluation:  Exercise Goals Re-Evaluation     Row Name 10/21/23 9062 11/15/23 0825 12/13/23 0823 12/27/23 0834 01/27/24 1424     Exercise Goal Re-Evaluation   Exercise Goals Review Increase Physical Activity;Understanding of Exercise Prescription;Increase Strength and Stamina;Knowledge and understanding of Target Heart Rate Range (THRR);Able to understand and use rate of perceived exertion (RPE) scale Increase Physical Activity;Understanding of Exercise Prescription;Increase Strength and Stamina;Knowledge and understanding of Target Heart Rate Range (THRR);Able to understand and use rate of perceived exertion (RPE) scale Increase Physical Activity;Understanding of Exercise Prescription;Increase Strength and Stamina;Knowledge and understanding of Target Heart Rate Range (THRR);Able to understand and use rate of perceived exertion (RPE) scale Increase Physical Activity;Understanding of Exercise Prescription;Increase Strength and Stamina;Knowledge and understanding of Target Heart Rate Range (THRR);Able to understand and use rate of perceived exertion (RPE) scale Increase Physical Activity;Understanding of Exercise Prescription;Increase Strength and Stamina;Knowledge and understanding of Target Heart Rate Range (THRR);Able to understand and use rate of perceived exertion (RPE) scale   Comments Pt first day in the [facility] program. Pt tolerated exercise well with an average  MET level of 2.5. He is off to a good start and is learning his THRR, RPE and ExRx Reviewed MET's, goals and home  ExRx. Pt tolerated exercise well with an average MET level of 3.15. He is doing well and progressing workloads and MET's. He feels good about his goals and is learning more about mindful wellness and is increasing endurance. He will add in exercise 1-2 days for 30-45 mins by walking, treadmill and stationary bike. Reviewed MET's, goals and home ExRx. Pt tolerated exercise well with an average MET level of 3.15. He will add in exercise 2 days for 30-45 mins by walking and going to [employer]. He is doing well with his goals and is having sucessful weight loss. He is feeling an increase in energy too. Reviewed MET's. Pt tolerated exercise well with an average MET level of 4.15. He is doing well and progressing MET's, working on gradual progression and increases on WL's. Patient graduated the [employer] today. Pt tolerated exercise well with an average MET level of 4.15. He did very well in the program and increased on his post walk test by 132ft for a total of 1680. He will continue to exercise on his own by going to [employer], weights, treadmill, stationary bike and walking 4-5 days 60 mins per session.   Expected Outcomes Will continue to monitor pt and progress workloads as tolerated without sign or symptom Will continue to monitor pt and progress workloads as tolerated without sign or symptom Will continue to monitor pt and progress workloads as tolerated without sign or symptom Will continue to monitor pt and progress workloads as tolerated without sign or symptom Will continue to monitor pt and progress workloads as tolerated without sign or symptom      Nutrition & Weight - Outcomes:  Pre Biometrics - 10/17/23 1510       Pre Biometrics   Waist Circumference 40.5 inches    Hip Circumference 40 inches    Waist to Hip Ratio 1.01 %    Triceps Skinfold 19 mm    % Body Fat 29.6 %    Grip Strength 37 kg    Flexibility --   unable to reach   Single Leg Stand 30 seconds           Post Biometrics - 01/24/24 0846        Post  Biometrics   Height 5' 10.5 (1.791 m)    Weight 94 kg    Waist Circumference 39 inches    Hip Circumference 42 inches    Waist to Hip Ratio 0.93 %    BMI (Calculated) 29.3    Triceps Skinfold 17 mm    % Body Fat 28.2 %    Grip Strength 32 kg    Flexibility --   Unable to reach   Single Leg Stand 30 seconds          Nutrition:  Nutrition Therapy & Goals - 12/16/23 0859       Nutrition Therapy   Diet Heart Healthy Diet    Drug/Food Interactions Statins/Certain Fruits      Personal Nutrition Goals   Nutrition Goal Patient to identify strategies for reducing cardiovascular risk by attending the [facility] education and nutrition series weekly.   goal in action.   Personal Goal #2 Patient to improve diet quality by using the plate method as a guide for meal planning to include lean protein/plant protein, fruits, vegetables, whole grains, nonfat dairy as part of a  well-balanced diet.   goal in action.   Personal Goal #3 Patient to identify strategies for weight loss of 0.5-2.0# per week.   goal in action.   Comments Goals in action. Patient has medical history of CAD s/p CABG, HTN, hyperlipidemia, CKD3, OSA, old MI.He continues to attend the [facility] education/nutrition series regularly. LDL at goal. He is followed by Nephrology; potassium historically elevated but now in acceptable range. Patient is motivated to lose weight and have discussed strategies for weight loss including calorie density, the plate method as a guide for meal planning, label reading, etc.  He is down 3.5# since starting with our program. Patient will benefit from participation/completion of intensive cardiac rehab and adherence to nutrition, exercise, and lifestyle modification.      Intervention Plan   Intervention Prescribe, educate and counsel regarding individualized specific dietary modifications aiming towards targeted core components such as weight,  hypertension, lipid management, diabetes, heart failure and other comorbidities.;Nutrition handout(s) given to patient.    Expected Outcomes Short Term Goal: Understand basic principles of dietary content, such as calories, fat, sodium, cholesterol and nutrients.;Long Term Goal: Adherence to prescribed nutrition plan.          Nutrition Discharge:  Nutrition Assessments - 01/27/24 0919       Rate Your Plate Scores   Pre Score 67    Post Score 74          Education Questionnaire Score:  Knowledge Questionnaire Score - 01/27/24 0921       Knowledge Questionnaire Score   Post Score 23/24         Pt graduates from  Intensive/Traditional cardiac rehab program today with completion of 69 exercise and education sessions. Pt maintained good attendance and progressed nicely during their participation in rehab as evidenced by increased MET level.   Medication list reconciled. Repeat  PHQ score-  .  Pt has made significant lifestyle changes and should be commended for his success.  Quirino achieved his goals during cardiac rehab.   Pt plans to continue exercise on his own by going to [employer] as per EP's note above.  Goals reviewed with patient; copy given to patient.

## 2024-01-29 ENCOUNTER — Encounter (HOSPITAL_COMMUNITY)

## 2024-01-31 ENCOUNTER — Encounter (HOSPITAL_COMMUNITY)

## 2024-02-03 ENCOUNTER — Encounter (HOSPITAL_COMMUNITY)

## 2024-02-05 ENCOUNTER — Encounter (HOSPITAL_COMMUNITY)

## 2024-02-07 ENCOUNTER — Encounter (HOSPITAL_COMMUNITY)

## 2024-02-10 ENCOUNTER — Encounter (HOSPITAL_COMMUNITY)

## 2024-02-10 NOTE — Progress Notes (Unsigned)
 Cardiology Office Note:    Date:  02/11/2024   ID:  Joshua Hahn May 22, 1945, MRN [id number]  PCP:  Freddrick Johns   [facility] Providers Cardiologist:  Ozell Fell, MD     Referring MD: Katrinka Garnette KIDD, MD   Chief Complaint  Patient presents with   Coronary Artery Disease    History of Present Illness:    Joshua Hahn is a 78 y.o. male presenting for follow-up of coronary artery disease.  The patient has a history of remote CABG after he presented with PTCA initially in 1988 and then an inferior wall MI in 1994.  Multivessel CABG involved LIMA to diagonal, saphenous vein graft to LAD, saphenous vein graft to obtuse marginal 1 and 2, and saphenous vein graft to RCA.  He subsequently has undergone multiple PCI procedures in the native and saphenous vein graft vessels.  He had an abnormal PET stress test earlier this year and underwent stenting of severe stenosis in the left main and ostial circumflex.  Patient is here alone today.  States that he is doing great.  He has had no recurrence of angina.  He denies any chest pain, chest pressure, or shortness of breath.  He is enjoying his family and is going to the [employer] game this weekend with his son and grandson.  The patient has no complaints today.  He feels like he is tolerating his medications well.  Current Medications: Current Meds  Medication Sig   amLODipine  (NORVASC ) 5 MG tablet Take 1.5 tablets (7.5 mg total) by mouth daily.   Ascorbic Acid  (VITAMIN C  PO) Take 1 tablet by mouth at bedtime.   aspirin  81 MG tablet Take 81 mg by mouth at bedtime.   cholecalciferol  (VITAMIN D ) 1000 UNITS tablet Take 1,000 Units by mouth at bedtime.   clopidogrel  (PLAVIX ) 75 MG tablet Take 1 tablet (75 mg total) by mouth daily.   Coenzyme Q10 (CO Q 10) 100 MG CAPS Take 300 mg by mouth at bedtime.   dorzolamide (TRUSOPT) 2 % ophthalmic solution Place 1 drop into both eyes daily as needed (irritation).   ezetimibe  (ZETIA ) 10 MG  tablet Take 1 tablet (10 mg total) by mouth daily.   isosorbide  mononitrate (IMDUR ) 120 MG 24 hr tablet Take 1 tablet (120 mg total) by mouth daily.   metoprolol  tartrate (LOPRESSOR ) 25 MG tablet Take 1 tablet (25 mg total) by mouth at bedtime.   nitroGLYCERIN  (NITROSTAT ) 0.4 MG SL tablet Place 1 tablet (0.4 mg total) under the tongue every 5 (five) minutes as needed for chest pain.   omega-3 acid ethyl esters (LOVAZA ) 1 g capsule TAKE 1 CAPSULE BY MOUTH 2 TIMES DAILY.   ranolazine  (RANEXA ) 1000 MG SR tablet Take 1 tablet (1,000 mg total) by mouth 2 (two) times daily.   rosuvastatin  (CRESTOR ) 40 MG tablet Take 1 tablet (40 mg total) by mouth daily.   sodium zirconium cyclosilicate  (LOKELMA ) 10 g PACK packet Take 10 g (1 packet total) by mouth as needed per instruction by provider based on blood work..   valsartan  (DIOVAN ) 80 MG tablet Take 1 tablet (80 mg total) by mouth daily.   vitamin B-12 (CYANOCOBALAMIN ) 100 MCG tablet Take 100 mcg by mouth at bedtime.     Allergies:   Patient has no known allergies.   ROS:   Please see the history of present illness.    All other systems reviewed and are negative.  EKGs/Labs/Other Studies Reviewed:    The following studies  were reviewed today: Cardiac Studies & Procedures   ______________________________________________________________________________________________ CARDIAC CATHETERIZATION  CARDIAC CATHETERIZATION 09/20/2023  Conclusion 1.  Severe native vessel coronary artery disease with total occlusion of the ostial LAD, severe calcific stenosis of the distal left main into the proximal circumflex, and chronic total occlusion of the RCA 2.  Status post aortocoronary bypass surgery with known chronic occlusion of the SVG to RCA and wide patency of the bypass graft to the first diagonal and LAD 3.  Successful PCI of severe stenosis in the left main/ostial left circumflex with high-pressure score flex balloon dilatation followed by stenting and  high-pressure noncompliant balloon angioplasty.  Mild stent underexpansion due to dense calcification with 20% residual stenosis at the completion of the procedure. 4.  Normal LVEDP  Recommendations: Overnight observation with IV fluid hydration in the setting of chronic kidney disease, anticipate hospital discharge tomorrow morning as long as no complications arise.  Recommend long-term DAPT, continue aspirin  and clopidogrel .  Findings Coronary Findings Diagnostic  Dominance: Right  Left Anterior Descending Ost LAD to Prox LAD lesion is 100% stenosed.  Left Circumflex Ost Cx to Mid Cx lesion is 90% stenosed with 0% stenosed side branch in 2nd Mrg. The lesion is eccentric. The lesion is severely calcified. The lesion was previously treated . Dist Cx lesion is 100% stenosed.  Second Obtuse Marginal Branch Collaterals 2nd Mrg filled by collaterals from 2nd Diag.  Right Coronary Artery Prox RCA lesion is 100% stenosed.  Right Posterior Descending Artery Collaterals RPDA filled by collaterals from 2nd Mrg.  First Right Posterolateral Branch Collaterals 1st RPL filled by collaterals from 1st Mrg.  Graft To RPDA Origin to Prox Graft lesion is 100% stenosed.  Graft To 1st Diag  Graft To Mid LAD Previously placed Origin to Prox Graft stent of unknown type is  widely patent.  Intervention  Ost Cx to Mid Cx lesion with side branch in 2nd Mrg Stent - Main Branch CATH VISTA GUIDE 6FR [id number] guide catheter was inserted. Lesion crossed with guidewire using a WIRE RUNTHROUGH .[id number]. Pre-stent angioplasty was performed using a BALLOON SCOREFLEX 3.0X10. Maximum pressure:  20 atm. A drug-eluting stent was successfully placed using a STENT SYNERGY XD 3.0X16. Maximum pressure: 16 atm. Post-stent angioplasty was performed using a BALLOON [place] EMERGE MR 3.5X12. Maximum pressure:  20 atm. Post-Intervention Lesion Assessment The intervention was successful. Pre-interventional TIMI flow is 3.  Post-intervention TIMI flow is 3. No complications occurred at this lesion. There is a 0% residual stenosis in the main branch post intervention. There is a 20% residual stenosis in the side branch post intervention.   STRESS TESTS  NM PET CT CARDIAC PERFUSION MULTI W/ABSOLUTE BLOODFLOW 09/03/2023  Narrative   Severe reversible perfusion defect in mid to basal inferior/inferolateral walls consistent with ischemia.  In addition, TID is present (1.14), which is a high risk finding.  Myocardial blood flow reserve is reduced, though unreliable in setting of prior CABG.  Overall, findings suggest ischemia and study is high risk.  Cardiac catheterization recommended.   LV perfusion is abnormal. There is evidence of ischemia. Defect 1: There is a medium defect with severe reduction in uptake present in the mid to basal inferior and inferolateral location(s) that is reversible. There is normal wall motion in the defect area. Consistent with ischemia.   Rest left ventricular function is abnormal. Rest EF: 48%. Stress left ventricular function is normal. Stress EF: 54%. End diastolic cavity size is mildly enlarged. End systolic cavity size is mildly enlarged.  Myocardial blood flow was computed to be 0.66ml/g/min at rest and 1.28ml/g/min at stress. Global myocardial blood flow reserve was 1.62 and was abnormal.   Coronary calcium  assessment not performed due to prior revascularization.   Findings are consistent with ischemia. The study is high risk.   Electronically signed by Lonni Nanas, MD  EXAM: OVER-READ INTERPRETATION  PET-CT CHEST  The following report is an over-read performed by radiologist Dr. Camellia Lang [facility] on 09/03/2023. This over-read does not include interpretation of cardiac or coronary anatomy or pathology. The cardiac PET and cardiac CT interpretation by the cardiologist is to be attached.  COMPARISON:  Chest CTA 08/16/2023  FINDINGS: No evidence  for lymphadenopathy within the visualized mediastinum or hilar regions.  The visualized lung parenchyma shows no suspicious pulmonary nodule or mass. No focal airspace consolidation. Volume loss in the lower lobes is associated with probable atelectasis although underlying component of chronic interstitial lung disease cannot be excluded.  Visualized portions of the upper abdomen are unremarkable.  No suspicious lytic or sclerotic osseous abnormality.  IMPRESSION: 1. No acute or significant extracardiac findings. 2. Volume loss in the lower lobes is associated with probable atelectasis although underlying component of chronic interstitial lung disease cannot be excluded.   Electronically Signed By: Camellia Candle M.D. On: 09/03/2023 09:45   ECHOCARDIOGRAM  ECHOCARDIOGRAM COMPLETE 01/06/2024  Narrative ECHOCARDIOGRAM REPORT    Patient Name:   Joshua Hahn Date of Exam: 01/06/2024 Medical Rec #:  [id number]       Height:       70.0 in Accession #:    [id number]      Weight:       201.9 lb Date of Birth:  04-22-1946       BSA:          2.096 m Patient Age:    78 years        BP:           124/66 mmHg Patient Gender: M               HR:           65 bpm. Exam Location:  Inpatient  Procedure: 2D Echo, Color Doppler and Cardiac Doppler (Both Spectral and Color Flow Doppler were utilized during procedure).  Indications:    Elevated Troponin  History:        Patient has no prior history of Echocardiogram examinations. CAD and Previous Myocardial Infarction; Risk Factors:Hypertension and Dyslipidemia.  Sonographer:    Tinnie Gosling RDCS Referring Phys: [phone] SUBRINA SUNDIL  IMPRESSIONS   1. Left ventricular ejection fraction, by estimation, is 50 to 55%. The left ventricle has low normal function. The left ventricle demonstrates regional wall motion abnormalities. Septal hypokinesis. There is moderate asymmetric left ventricular hypertrophy of the basal-septal segment.  Left ventricular diastolic parameters are consistent with Grade I diastolic dysfunction (impaired relaxation). 2. Right ventricular systolic function was not well visualized. The right ventricular size is not well visualized. Tricuspid regurgitation signal is inadequate for assessing PA pressure. 3. Left atrial size was mildly dilated. 4. The mitral valve is normal in structure. Trivial mitral valve regurgitation. No evidence of mitral stenosis. 5. The aortic valve is tricuspid. Aortic valve regurgitation is trivial. No aortic stenosis is present. 6. Aortic dilatation noted. There is dilatation of the ascending aorta, measuring 42 mm.  FINDINGS Left Ventricle: Left ventricular ejection fraction, by estimation, is 50 to 55%. The left ventricle has low normal function. The left  ventricle demonstrates regional wall motion abnormalities. The left ventricular internal cavity size was normal in size. There is moderate asymmetric left ventricular hypertrophy of the basal-septal segment. Left ventricular diastolic parameters are consistent with Grade I diastolic dysfunction (impaired relaxation).  Right Ventricle: The right ventricular size is not well visualized. Right vetricular wall thickness was not well visualized. Right ventricular systolic function was not well visualized. Tricuspid regurgitation signal is inadequate for assessing PA pressure.  Left Atrium: Left atrial size was mildly dilated.  Right Atrium: Right atrial size was normal in size.  Pericardium: There is no evidence of pericardial effusion.  Mitral Valve: The mitral valve is normal in structure. Trivial mitral valve regurgitation. No evidence of mitral valve stenosis.  Tricuspid Valve: The tricuspid valve is normal in structure. Tricuspid valve regurgitation is trivial.  Aortic Valve: The aortic valve is tricuspid. Aortic valve regurgitation is trivial. No aortic stenosis is present.  Pulmonic Valve: The pulmonic valve was not  well visualized. Pulmonic valve regurgitation is trivial.  Aorta: The aortic root is normal in size and structure and aortic dilatation noted. There is dilatation of the ascending aorta, measuring 42 mm.  IAS/Shunts: The interatrial septum was not well visualized.   LEFT VENTRICLE PLAX 2D LVIDd:         5.50 cm      Diastology LVIDs:         3.60 cm      LV e' medial:    5.87 cm/s LV PW:         1.00 cm      LV E/e' medial:  12.9 LV IVS:        1.00 cm      LV e' lateral:   9.46 cm/s LVOT diam:     2.20 cm      LV E/e' lateral: 8.0 LV SV:         71 LV SV Index:   34 LVOT Area:     3.80 cm  LV Volumes (MOD) LV vol d, MOD A2C: 120.0 ml LV vol d, MOD A4C: 137.0 ml LV vol s, MOD A2C: 47.7 ml LV vol s, MOD A4C: 65.3 ml LV SV MOD A2C:     72.3 ml LV SV MOD A4C:     137.0 ml LV SV MOD BP:      71.7 ml  RIGHT VENTRICLE RV S prime:     10.90 cm/s TAPSE (M-mode): 1.7 cm  LEFT ATRIUM             Index        RIGHT ATRIUM           Index LA diam:        4.60 cm 2.19 cm/m   RA Area:     13.10 cm LA Vol (A2C):   58.0 ml 27.67 ml/m  RA Volume:   26.30 ml  12.55 ml/m LA Vol (A4C):   74.7 ml 35.64 ml/m LA Biplane Vol: 71.4 ml 34.07 ml/m AORTIC VALVE LVOT Vmax:   101.00 cm/s LVOT Vmean:  55.000 cm/s LVOT VTI:    0.187 m  AORTA Ao Root diam: 3.50 cm Ao Asc diam:  4.20 cm  MITRAL VALVE MV Area (PHT): 2.26 cm    SHUNTS MV Decel Time: 335 msec    Systemic VTI:  0.19 m MV E velocity: 75.90 cm/s  Systemic Diam: 2.20 cm MV A velocity: 94.90 cm/s MV E/A ratio:  0.80  Lonni Nanas MD Electronically signed by Lonni  Kate MD Signature Date/Time: 01/06/2024/12:10:25 PM    Final          ______________________________________________________________________________________________      EKG:        Recent Labs: 04/02/2023: TSH 1.550 08/16/2023: B Natriuretic Peptide 33.5 01/06/2024: ALT 14; BUN 20; Creatinine, Ser 2.22; Hemoglobin 13.1; Platelets 141;  Potassium 4.4; Sodium 139  Recent Lipid Panel    Component Value Date/Time   CHOL 126 09/10/2023 1439   CHOL 137 11/22/2014 1117   TRIG 121 09/10/2023 1439   TRIG 160 (H) 11/22/2014 1117   TRIG 216 (HH) 03/04/2006 0946   HDL 43 09/10/2023 1439   HDL 43 11/22/2014 1117   CHOLHDL 2.9 09/10/2023 1439   CHOLHDL 3.4 08/17/2015 1158   VLDL 32 (H) 08/17/2015 1158   LDLCALC 61 09/10/2023 1439   LDLCALC 62 11/22/2014 1117   LDLDIRECT 66.0 01/09/2018 1229           Physical Exam:    VS:  BP (!) 140/80   Pulse (!) 55   Ht 5' 10.5 (1.791 m)   Wt 206 lb (93.4 kg)   SpO2 98%   BMI 29.14 kg/m     Wt Readings from Last 3 Encounters:  02/11/24 206 lb (93.4 kg)  01/24/24 207 lb 3.7 oz (94 kg)  01/05/24 201 lb 15.1 oz (91.6 kg)     GEN:  Well nourished, well developed in no acute distress HEENT: Normal NECK: No JVD; No carotid bruits LYMPHATICS: No lymphadenopathy CARDIAC: RRR, no murmurs, rubs, gallops RESPIRATORY:  Clear to auscultation without rales, wheezing or rhonchi  ABDOMEN: Soft, non-tender, non-distended MUSCULOSKELETAL:  No edema; No deformity  SKIN: Warm and dry NEUROLOGIC:  Alert and oriented x 3 PSYCHIATRIC:  Normal affect   Assessment & Plan Essential hypertension Blood pressure controlled.  Continue amlodipine , isosorbide , metoprolol , and valsartan . Coronary artery disease involving coronary bypass graft of native heart with other forms of angina pectoris No anginal symptoms on aspirin  and clopidogrel , as well as multidrug antianginal therapy with isosorbide , metoprolol , amlodipine , and ranolazine . Stage 3 chronic kidney disease, unspecified whether stage 3a or 3b CKD (HCC) Followed by nephrology.  Treated with an ARB.  Last creatinine 2.2. Mixed hyperlipidemia Treated with ezetimibe  and rosuvastatin .  LDL cholesterol 61.     Medication Adjustments/Labs and Tests Ordered: Current medicines are reviewed at length with the patient today.  Concerns regarding  medicines are outlined above.  No orders of the defined types were placed in this encounter.  No orders of the defined types were placed in this encounter.   Patient Instructions  Medication Instructions:  No medication changes were made at this visit. Continue current regimen.   *If you need a refill on your cardiac medications before your next appointment, please call your pharmacy*  Lab Work: None ordered today. If you have labs (blood work) drawn today and your tests are completely normal, you will receive your results only by: MyChart Message (if you have MyChart) OR A paper copy in the mail If you have any lab test that is abnormal or we need to change your treatment, we will call you to review the results.  Testing/Procedures: None ordered today.  Follow-Up: At [facility], you and your health needs are our priority.  As part of our continuing mission to provide you with exceptional heart care, our providers are all part of one team.  This team includes your primary Cardiologist (physician) and Advanced Practice Providers or APPs (Physician Assistants and Nurse Practitioners)  who all work together to provide you with the care you need, when you need it.  Your next appointment:   6 month(s)  Provider:   One of our Advanced Practice Providers (APPs): Morse Clause, PA-C  Lamarr Satterfield, NP Miriam Shams, NP  Olivia Pavy, PA-C Josefa Beauvais, NP  Leontine Salen, PA-C Orren Fabry, PA-C  [place], PA-C Ernest Dick, NP  Damien Braver, NP Jon Hails, PA-C  Waddell Donath, PA-C    Dayna Dunn, PA-C  Scott Weaver, PA-C Lum Louis, NP Katlyn West, NP Callie Goodrich, PA-C  Xika Zhao, NP Sheng Haley, PA-C    Kathleen Johnson, PA-C   Then, Ozell Fell, MD will plan to see you again in 1 year(s).      Signed, Ozell Fell, MD  02/11/2024 1:24 PM    [facility]

## 2024-02-11 ENCOUNTER — Encounter: Payer: Self-pay | Admitting: Cardiovascular Disease

## 2024-02-11 ENCOUNTER — Ambulatory Visit: Attending: Cardiovascular Disease | Admitting: Cardiovascular Disease

## 2024-02-11 VITALS — BP 140/80 | HR 55 | Ht 70.5 in | Wt 206.0 lb

## 2024-02-11 DIAGNOSIS — I10 Essential (primary) hypertension: Secondary | ICD-10-CM | POA: Diagnosis not present

## 2024-02-11 DIAGNOSIS — N183 Chronic kidney disease, stage 3 unspecified: Secondary | ICD-10-CM | POA: Diagnosis not present

## 2024-02-11 DIAGNOSIS — E782 Mixed hyperlipidemia: Secondary | ICD-10-CM | POA: Insufficient documentation

## 2024-02-11 DIAGNOSIS — I25708 Atherosclerosis of coronary artery bypass graft(s), unspecified, with other forms of angina pectoris: Secondary | ICD-10-CM | POA: Diagnosis not present

## 2024-02-11 NOTE — Patient Instructions (Signed)
 Medication Instructions:  No medication changes were made at this visit. Continue current regimen.   *If you need a refill on your cardiac medications before your next appointment, please call your pharmacy*  Lab Work: None ordered today. If you have labs (blood work) drawn today and your tests are completely normal, you will receive your results only by: MyChart Message (if you have MyChart) OR A paper copy in the mail If you have any lab test that is abnormal or we need to change your treatment, we will call you to review the results.  Testing/Procedures: None ordered today.  Follow-Up: At [facility], you and your health needs are our priority.  As part of our continuing mission to provide you with exceptional heart care, our providers are all part of one team.  This team includes your primary Cardiologist (physician) and Advanced Practice Providers or APPs (Physician Assistants and Nurse Practitioners) who all work together to provide you with the care you need, when you need it.  Your next appointment:   6 month(s)  Provider:   One of our Advanced Practice Providers (APPs): Morse Clause, PA-C  Lamarr Satterfield, NP Miriam Shams, NP  Olivia Pavy, PA-C Josefa Beauvais, NP  Leontine Salen, PA-C Orren Fabry, PA-C  [place], PA-C Ernest Dick, NP  Damien Braver, NP Jon Hails, PA-C  Waddell Donath, PA-C    Dayna Dunn, PA-C  Scott Weaver, PA-C Lum Louis, NP Katlyn West, NP Callie Goodrich, PA-C  Xika Zhao, NP Sheng Haley, PA-C    Kathleen Johnson, PA-C   Then, Ozell Fell, MD will plan to see you again in 1 year(s).

## 2024-02-11 NOTE — Assessment & Plan Note (Signed)
 Blood pressure controlled.  Continue amlodipine , isosorbide , metoprolol , and valsartan .

## 2024-02-11 NOTE — Assessment & Plan Note (Signed)
 No anginal symptoms on aspirin  and clopidogrel , as well as multidrug antianginal therapy with isosorbide , metoprolol , amlodipine , and ranolazine .

## 2024-02-11 NOTE — Assessment & Plan Note (Signed)
 Treated with ezetimibe  and rosuvastatin .  LDL cholesterol 61.

## 2024-02-12 ENCOUNTER — Encounter (HOSPITAL_COMMUNITY)

## 2024-02-14 ENCOUNTER — Encounter (HOSPITAL_COMMUNITY)

## 2024-04-07 ENCOUNTER — Other Ambulatory Visit: Payer: Self-pay | Admitting: Cardiovascular Disease

## 2024-04-13 MED ORDER — OMEGA-3-ACID ETHYL ESTERS 1 G PO CAPS: 1.0000 | ORAL_CAPSULE | Freq: Two times a day (BID) | ORAL | 3 refills | Status: AC

## 2024-04-21 ENCOUNTER — Ambulatory Visit: Payer: Medicare Other

## 2024-04-21 VITALS — BP 138/66 | HR 70 | Temp 98.1°F | Ht 70.0 in | Wt 204.4 lb

## 2024-04-21 DIAGNOSIS — Z23 Encounter for immunization: Secondary | ICD-10-CM | POA: Diagnosis not present

## 2024-04-21 DIAGNOSIS — Z0000 Encounter for general adult medical examination without abnormal findings: Secondary | ICD-10-CM

## 2024-04-21 NOTE — Progress Notes (Signed)
 Chief Complaint  Patient presents with   Medicare Wellness     Subjective:   Joshua Hahn is a 78 y.o. male who presents for a Medicare Annual Wellness Visit.  Visit info / Clinical Intake: Medicare Wellness Visit Type:: Subsequent Annual Wellness Visit Persons participating in visit and providing information:: patient Medicare Wellness Visit Mode:: In-person (required for WTM) Interpreter Needed?: No Pre-visit prep was completed: yes AWV questionnaire completed by patient prior to visit?: yes Date:: 04/20/24 Living arrangements:: (Patient-Rptd) with family/others Patient's Overall Health Status Rating: (Patient-Rptd) very good Typical amount of pain: (Patient-Rptd) some Does pain affect daily life?: (!) (Patient-Rptd) yes Are you currently prescribed opioids?: no  Dietary Habits and Nutritional Risks How many meals a day?: (Patient-Rptd) 3 Eats fruit and vegetables daily?: (Patient-Rptd) yes Most meals are obtained by: (Patient-Rptd) preparing own meals In the last 2 weeks, have you had any of the following?: none Diabetic:: no  Functional Status Activities of Daily Living (to include ambulation/medication): (Patient-Rptd) Independent Ambulation: Independent with device- listed below Home Assistive Devices/Equipment: Eyeglasses Medication Administration: (Patient-Rptd) Independent Home Management (perform basic housework or laundry): (Patient-Rptd) Independent Manage your own finances?: (Patient-Rptd) yes Primary transportation is: (Patient-Rptd) driving Concerns about vision?: no *vision screening is required for WTM* Concerns about hearing?: no  Fall Screening Falls in the past year?: (Patient-Rptd) 0 Number of falls in past year: 0 Was there an injury with Fall?: 0 Fall Risk Category Calculator: 0 Patient Fall Risk Level: Low Fall Risk  Fall Risk Patient at Risk for Falls Due to: No Fall Risks Fall risk Follow up: Falls prevention discussed  Home and  Transportation Safety: All rugs have non-skid backing?: (Patient-Rptd) yes All stairs or steps have railings?: (Patient-Rptd) yes Grab bars in the bathtub or shower?: (Patient-Rptd) yes Have non-skid surface in bathtub or shower?: (Patient-Rptd) yes Good home lighting?: (Patient-Rptd) yes Regular seat belt use?: (Patient-Rptd) yes Hospital stays in the last year:: (!) (Patient-Rptd) yes How many hospital stays:: (Patient-Rptd) 1  Cognitive Assessment Difficulty concentrating, remembering, or making decisions? : (Patient-Rptd) no Will 6CIT or Mini Cog be Completed: yes What year is it?: 0 points What month is it?: 0 points Give patient an address phrase to remember (5 components): [address] [place]  About what time is it?: 0 points Count backwards from 20 to 1: 0 points Say the months of the year in reverse: 0 points Repeat the address phrase from earlier: 0 points 6 CIT Score: 0 points  Advance Directives (For Healthcare) Does Patient Have a Medical Advance Directive?: Yes Does patient want to make changes to medical advance directive?: No - Guardian declined Type of Advance Directive: Living will Copy of Healthcare Power of Attorney in Chart?: No - copy requested Copy of Living Will in Chart?: No - copy requested Would patient like information on creating a medical advance directive?: No - Patient declined  Reviewed/Updated  Reviewed/Updated: Reviewed All (Medical, Surgical, Family, Medications, Allergies, Care Teams, Patient Goals)    Allergies (verified) Patient has no known allergies.   Current Medications (verified) Outpatient Encounter Medications as of 04/21/2024  Medication Sig   amLODipine  (NORVASC ) 5 MG tablet Take 1.5 tablets (7.5 mg total) by mouth daily.   Ascorbic Acid  (VITAMIN C  PO) Take 1 tablet by mouth at bedtime.   aspirin  81 MG tablet Take 81 mg by mouth at bedtime.   cholecalciferol  (VITAMIN D ) 1000 UNITS tablet Take 1,000 Units by mouth at  bedtime.   clopidogrel  (PLAVIX ) 75 MG tablet Take  1 tablet (75 mg total) by mouth daily.   Coenzyme Q10 (CO Q 10) 100 MG CAPS Take 300 mg by mouth at bedtime.   dorzolamide (TRUSOPT) 2 % ophthalmic solution Place 1 drop into both eyes daily as needed (irritation).   ezetimibe  (ZETIA ) 10 MG tablet Take 1 tablet (10 mg total) by mouth daily.   isosorbide  mononitrate (IMDUR ) 120 MG 24 hr tablet Take 1 tablet (120 mg total) by mouth daily.   metoprolol  tartrate (LOPRESSOR ) 25 MG tablet Take 1 tablet (25 mg total) by mouth at bedtime.   nitroGLYCERIN  (NITROSTAT ) 0.4 MG SL tablet Place 1 tablet (0.4 mg total) under the tongue every 5 (five) minutes as needed for chest pain.   omega-3 acid ethyl esters (LOVAZA ) 1 g capsule Take 1 capsule (1 g total) by mouth 2 (two) times daily.   ranolazine  (RANEXA ) 1000 MG SR tablet Take 1 tablet (1,000 mg total) by mouth 2 (two) times daily.   rosuvastatin  (CRESTOR ) 40 MG tablet Take 1 tablet (40 mg total) by mouth daily.   valsartan  (DIOVAN ) 80 MG tablet Take 1 tablet (80 mg total) by mouth daily.   vitamin B-12 (CYANOCOBALAMIN ) 100 MCG tablet Take 100 mcg by mouth at bedtime.   [DISCONTINUED] sodium zirconium cyclosilicate  (LOKELMA ) 10 g PACK packet Take 10 g (1 packet total) by mouth as needed per instruction by provider based on blood work.. (Patient not taking: Reported on 04/21/2024)   No facility-administered encounter medications on file as of 04/21/2024.    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 levels LDL IIIa+b% and HDL2b%   Hypertension    Myocardial infarct [facility])    Myocardial infarction (HCC) 1994   inferior wall MI at  [facility]   Prostate cancer [facility])    Sleep apnea    uses CPAP PRN   Sleep apnea, obstructive    using C-PAP   Past Surgical History:  Procedure Laterality Date   ABDOMINAL SURGERY     CARDIAC CATHETERIZATION  06/23/2008   occlusion sequential OM1 andOM2 graft as well vein graft to RCA.LAD and diagonal system remain intact. RCA  collateralized   CORONARY ANGIOPLASTY WITH STENT PLACEMENT     CORONARY ARTERY BYPASS GRAFT  1994   CORONARY STENT INTERVENTION N/A 09/20/2023   Procedure: CORONARY STENT INTERVENTION;  Surgeon: Wonda Sharper, MD;  Location: [facility] INVASIVE CV LAB;  Service: Cardiovascular;  Laterality: N/A;   EXPLORATORY LAPAROTOMY     ideopathic retroperitoneal fibrosis   LEFT HEART CATH AND CORS/GRAFTS ANGIOGRAPHY N/A 09/20/2023   Procedure: LEFT HEART CATH AND CORS/GRAFTS ANGIOGRAPHY;  Surgeon: Wonda Sharper, MD;  Location: [facility] INVASIVE CV LAB;  Service: Cardiovascular;  Laterality: N/A;   LEFT HEART CATHETERIZATION WITH CORONARY/GRAFT ANGIOGRAM N/A 02/04/2013   Procedure: LEFT HEART CATHETERIZATION WITH EL BILE;  Surgeon: Debby DELENA Sor, MD;  Location: [facility] CATH LAB;  Service: Cardiovascular;  Laterality: N/A;   LEFT PAROTID GLAND SURGRY  05/2009   Dr. Arlana   PERCUTANEOUS CORONARY STENT INTERVENTION (PCI-S)  06/25/2008   rotational atherectomy w/diffuse disease native circ. w/drug eluting stents , PTCA on one vessel; grat occlusion  to RCA w/native RCA proxim. occlusion. Circ.supplied collaaterals distal RCA  following intervention   PERCUTANEOUS CORONARY STENT INTERVENTION (PCI-S) N/A 02/05/2013   Procedure: PERCUTANEOUS CORONARY STENT INTERVENTION (PCI-S);  Surgeon: Debby DELENA Sor, MD;  Location[facility] CATH LAB;  Service: Cardiovascular;  Laterality: N/A;   PROSTATECTOMY  06/2009   robotic-asssisted laparoscopic radical by Dr Nicholaus   Family History  Problem Relation Age of Onset   Pancreatic cancer Father    Heart disease  Father    Hypertension Father    Stroke Mother    Hypertension Mother    Coronary artery disease Brother 11       2 bros with early CAD   Hypertension Brother    Coronary artery disease Brother    Hypertension Brother    Coronary artery disease Brother    Hypertension Brother    Heart attack Maternal Grandfather    Hypertension Paternal Grandfather    Lupus Daughter    Hypertension Son    Hypertension Sister    Hypertension Sister    Throat cancer Brother    Colon cancer Neg Hx    Esophageal cancer Neg Hx    Stomach cancer Neg Hx    Rectal cancer Neg Hx    Social History   Occupational History   Occupation: Education Officer, Environmental -retired     Comment: [employer]- [place]   Tobacco Use   Smoking status: Former    Current packs/day: 0.00    Types: Cigarettes    Quit date: 05/07/1973    Years since quitting: 50.9    Passive exposure: Never   Smokeless tobacco: Never  Vaping Use   Vaping status: Never Used  Substance and Sexual Activity   Alcohol use: No   Drug use: No   Sexual activity: Not Currently   Tobacco Counseling Counseling given: Not Answered  SDOH Screenings   Food Insecurity: No Food Insecurity (04/20/2024)  Housing: Low Risk (04/20/2024)  Transportation Needs: No Transportation Needs (04/20/2024)  Utilities: Not At Risk (04/21/2024)  Depression (PHQ2-9): Low Risk (04/21/2024)  Financial Resource Strain: Low Risk (04/20/2024)  Physical Activity: Insufficiently Active (04/20/2024)  Social Connections: Socially Integrated (04/20/2024)  Stress: No Stress Concern Present (04/20/2024)  Tobacco Use: Medium Risk (04/21/2024)  Health Literacy: Adequate Health Literacy (04/21/2024)   See flowsheets for full screening details  Depression Screen PHQ 2 & 9 Depression Scale- Over the past 2 weeks, how often have you been bothered by any of the following problems? Little interest or pleasure in doing things: 0 Feeling down, depressed, or hopeless (PHQ  Adolescent also includes...irritable): 0 PHQ-2 Total Score: 0 Trouble falling or staying asleep, or sleeping too much: 0 Feeling tired or having little energy: 1 Poor appetite or overeating (PHQ Adolescent also includes...weight loss): 0 Feeling bad about yourself - or that you are a failure or have let yourself or your family down: 0 Trouble concentrating on things, such as reading the newspaper or watching television (PHQ Adolescent also includes...like school work): 0 Moving or speaking so slowly that other people could have noticed. Or the opposite - being so fidgety or restless that you have been moving around a lot more than usual: 0 Thoughts that you would be better off dead, or of hurting yourself in some way: 0 PHQ-9 Total Score: 1 If you checked off any problems, how difficult have these problems made it for you to do your work, take care of things at home, or get along with other people?: Not difficult at  all  Depression Treatment Depression Interventions/Treatment : PHQ2-9 Score <4 Follow-up Not Indicated     Goals Addressed               This Visit's Progress     lose weight and walk 3 miles a day (pt-stated)        Lose weight and 3 miles a day for 5 days              Objective:    Today's Vitals   04/21/24 1310  BP: 138/66  Pulse: 70  Temp: 98.1 F (36.7 C)  SpO2: 95%  Weight: 204 lb 6.4 oz (92.7 kg)  Height: 5' 10 (1.778 m)   Body mass index is 29.33 kg/m.  Hearing/Vision screen Hearing Screening - Comments:: Pt denies any hearing issues  Vision Screening - Comments:: Wears rx glasses - up to date with routine eye exams with Dr cleatus  Immunizations and Health Maintenance Health Maintenance  Topic Date Due   COVID-19 Vaccine (8 - 2025-26 season) 01/06/2024   Medicare Annual Wellness (AWV)  04/21/2025   DTaP/Tdap/Td (6 - Td or Tdap) 01/15/2027   Colonoscopy  07/16/2027   Pneumococcal Vaccine: 50+ Years  Completed   Influenza Vaccine   Completed   Hepatitis C Screening  Completed   Zoster Vaccines- Shingrix  Completed   Meningococcal B Vaccine  Aged Out        Assessment/Plan:  This is a routine wellness examination for Isabella.  Patient Care Team: Pcp, No as PCP - General Wonda Sharper, MD as PCP - Cardiology (Cardiology) [place], [place], [place] as Consulting Physician (Optometry) [place], Sherlean CROME, [facility] (Inactive) as Pharmacist (Pharmacist)  I have personally reviewed and noted the following in the patients chart:   Medical and social history Use of alcohol, tobacco or illicit drugs  Current medications and supplements including opioid prescriptions. Functional ability and status Nutritional status Physical activity Advanced directives List of other physicians Hospitalizations, surgeries, and ER visits in previous 12 months Vitals Screenings to include cognitive, depression, and falls Referrals and appointments  Orders Placed This Encounter  Procedures   Flu vaccine HIGH DOSE PF(Fluzone Trivalent)   In addition, I have reviewed and discussed with patient certain preventive protocols, quality metrics, and best practice recommendations. A written personalized care plan for preventive services as well as general preventive health recommendations were provided to patient.   Ellouise VEAR Haws, LPN   [id number]/[id number]   Return in about 1 year (around 04/26/2025).  After Visit Summary: (In Person-Printed) AVS printed and given to the patient  Nurse Notes: No voiced or noted concerns at this time

## 2024-04-21 NOTE — Patient Instructions (Signed)
 Joshua Hahn,  Thank you for taking the time for your Medicare Wellness Visit. I appreciate your continued commitment to your health goals. Please review the care plan we discussed, and feel free to reach out if I can assist you further.  Please note that Annual Wellness Visits do not include a physical exam. Some assessments may be limited, especially if the visit was conducted virtually. If needed, we may recommend an in-person follow-up with your provider.  Ongoing Care Seeing your primary care provider every 3 to 6 months helps us  monitor your health and provide consistent, personalized care.   Referrals If a referral was made during today's visit and you haven't received any updates within two weeks, please contact the referred provider directly to check on the status.  Recommended Screenings:  Health Maintenance  Topic Date Due   Flu Shot  12/06/2023   COVID-19 Vaccine (8 - 2025-26 season) 01/06/2024   Medicare Annual Wellness Visit  04/21/2025   DTaP/Tdap/Td vaccine (6 - Td or Tdap) 01/15/2027   Colon Cancer Screening  07/16/2027   Pneumococcal Vaccine for age over 41  Completed   Hepatitis C Screening  Completed   Zoster (Shingles) Vaccine  Completed   Meningitis B Vaccine  Aged Out       04/20/2024   10:19 PM  Advanced Directives  Does Patient Have a Medical Advance Directive? Yes  Type of Advance Directive Living will    Vision: Annual vision screenings are recommended for early detection of glaucoma, cataracts, and diabetic retinopathy. These exams can also reveal signs of chronic conditions such as diabetes and high blood pressure.  Dental: Annual dental screenings help detect early signs of oral cancer, gum disease, and other conditions linked to overall health, including heart disease and diabetes.  Please see the attached documents for additional preventive care recommendations.

## 2024-04-28 LAB — OPHTHALMOLOGY REPORT-SCANNED

## 2024-05-28 ENCOUNTER — Other Ambulatory Visit: Payer: Self-pay | Admitting: Physician Assistant

## 2024-06-04 ENCOUNTER — Ambulatory Visit: Admitting: Cardiovascular Disease

## 2024-06-19 ENCOUNTER — Ambulatory Visit: Admitting: Family Medicine

## 2024-08-06 ENCOUNTER — Ambulatory Visit: Admitting: Emergency Medicine

## 2025-04-26 ENCOUNTER — Ambulatory Visit
